# Patient Record
Sex: Female | Born: 1950
Health system: Southern US, Community
[De-identification: ages and names within clinical notes are randomized; demographics above are authoritative.]

## PROBLEM LIST (undated history)

## (undated) DIAGNOSIS — I2510 Atherosclerotic heart disease of native coronary artery without angina pectoris: Secondary | ICD-10-CM

## (undated) DIAGNOSIS — K219 Gastro-esophageal reflux disease without esophagitis: Secondary | ICD-10-CM

## (undated) DIAGNOSIS — K589 Irritable bowel syndrome without diarrhea: Secondary | ICD-10-CM

## (undated) DIAGNOSIS — G4730 Sleep apnea, unspecified: Secondary | ICD-10-CM

## (undated) DIAGNOSIS — T7840XA Allergy, unspecified, initial encounter: Secondary | ICD-10-CM

## (undated) DIAGNOSIS — M1990 Unspecified osteoarthritis, unspecified site: Secondary | ICD-10-CM

## (undated) DIAGNOSIS — F32A Depression, unspecified: Secondary | ICD-10-CM

## (undated) DIAGNOSIS — F419 Anxiety disorder, unspecified: Secondary | ICD-10-CM

## (undated) DIAGNOSIS — N281 Cyst of kidney, acquired: Secondary | ICD-10-CM

## (undated) DIAGNOSIS — I10 Essential (primary) hypertension: Secondary | ICD-10-CM

## (undated) DIAGNOSIS — F329 Major depressive disorder, single episode, unspecified: Secondary | ICD-10-CM

## (undated) DIAGNOSIS — K649 Unspecified hemorrhoids: Secondary | ICD-10-CM

## (undated) HISTORY — DX: Gastro-esophageal reflux disease without esophagitis: K21.9

## (undated) HISTORY — DX: Unspecified osteoarthritis, unspecified site: M19.90

## (undated) HISTORY — DX: Sleep apnea, unspecified: G47.30

## (undated) HISTORY — DX: Unspecified hemorrhoids: K64.9

## (undated) HISTORY — PX: SINOSCOPY: SHX187

## (undated) HISTORY — DX: Allergy, unspecified, initial encounter: T78.40XA

## (undated) HISTORY — DX: Essential (primary) hypertension: I10

## (undated) HISTORY — DX: Cyst of kidney, acquired: N28.1

## (undated) HISTORY — PX: BONE BIOPSY: SHX375

## (undated) HISTORY — DX: Atherosclerotic heart disease of native coronary artery without angina pectoris: I25.10

## (undated) HISTORY — PX: TUBAL LIGATION: SHX77

---
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
## 1997-07-15 ENCOUNTER — Emergency Department (HOSPITAL_COMMUNITY): Admission: EM | Admit: 1997-07-15 | Discharge: 1997-07-15 | Payer: Self-pay | Admitting: Emergency Medicine

## 1998-06-25 ENCOUNTER — Other Ambulatory Visit: Admission: RE | Admit: 1998-06-25 | Discharge: 1998-06-25 | Payer: Self-pay | Admitting: *Deleted

## 1999-06-13 ENCOUNTER — Other Ambulatory Visit: Admission: RE | Admit: 1999-06-13 | Discharge: 1999-06-13 | Payer: Self-pay | Admitting: Gynecology

## 1999-08-25 ENCOUNTER — Encounter: Payer: Self-pay | Admitting: *Deleted

## 1999-08-29 ENCOUNTER — Encounter (INDEPENDENT_AMBULATORY_CARE_PROVIDER_SITE_OTHER): Payer: Self-pay | Admitting: Specialist

## 1999-08-29 ENCOUNTER — Ambulatory Visit (HOSPITAL_COMMUNITY): Admission: RE | Admit: 1999-08-29 | Discharge: 1999-08-29 | Payer: Self-pay | Admitting: *Deleted

## 2000-05-31 ENCOUNTER — Encounter (INDEPENDENT_AMBULATORY_CARE_PROVIDER_SITE_OTHER): Payer: Self-pay | Admitting: Specialist

## 2000-05-31 ENCOUNTER — Observation Stay (HOSPITAL_COMMUNITY): Admission: RE | Admit: 2000-05-31 | Discharge: 2000-06-01 | Payer: Self-pay | Admitting: *Deleted

## 2001-02-13 HISTORY — PX: ABDOMINAL HYSTERECTOMY: SHX81

## 2003-04-14 ENCOUNTER — Other Ambulatory Visit: Admission: RE | Admit: 2003-04-14 | Discharge: 2003-04-14 | Payer: Self-pay | Admitting: *Deleted

## 2003-06-24 ENCOUNTER — Emergency Department (HOSPITAL_COMMUNITY): Admission: EM | Admit: 2003-06-24 | Discharge: 2003-06-24 | Payer: Self-pay | Admitting: Emergency Medicine

## 2003-07-27 ENCOUNTER — Emergency Department (HOSPITAL_COMMUNITY): Admission: EM | Admit: 2003-07-27 | Discharge: 2003-07-27 | Payer: Self-pay | Admitting: Emergency Medicine

## 2004-07-20 ENCOUNTER — Emergency Department (HOSPITAL_COMMUNITY): Admission: EM | Admit: 2004-07-20 | Discharge: 2004-07-20 | Payer: Self-pay | Admitting: Emergency Medicine

## 2004-10-24 ENCOUNTER — Other Ambulatory Visit: Admission: RE | Admit: 2004-10-24 | Discharge: 2004-10-24 | Payer: Self-pay | Admitting: *Deleted

## 2005-01-28 ENCOUNTER — Emergency Department (HOSPITAL_COMMUNITY): Admission: EM | Admit: 2005-01-28 | Discharge: 2005-01-28 | Payer: Self-pay | Admitting: Emergency Medicine

## 2006-01-26 ENCOUNTER — Ambulatory Visit: Payer: Self-pay | Admitting: Internal Medicine

## 2006-04-15 ENCOUNTER — Emergency Department (HOSPITAL_COMMUNITY): Admission: EM | Admit: 2006-04-15 | Discharge: 2006-04-15 | Payer: Self-pay | Admitting: Emergency Medicine

## 2006-11-02 ENCOUNTER — Emergency Department (HOSPITAL_COMMUNITY): Admission: EM | Admit: 2006-11-02 | Discharge: 2006-11-03 | Payer: Self-pay | Admitting: Emergency Medicine

## 2007-01-11 ENCOUNTER — Emergency Department (HOSPITAL_COMMUNITY): Admission: EM | Admit: 2007-01-11 | Discharge: 2007-01-11 | Payer: Self-pay | Admitting: Emergency Medicine

## 2007-08-15 ENCOUNTER — Ambulatory Visit: Payer: Self-pay | Admitting: Vascular Surgery

## 2007-08-15 ENCOUNTER — Encounter (INDEPENDENT_AMBULATORY_CARE_PROVIDER_SITE_OTHER): Payer: Self-pay | Admitting: Emergency Medicine

## 2007-08-15 ENCOUNTER — Emergency Department (HOSPITAL_COMMUNITY): Admission: EM | Admit: 2007-08-15 | Discharge: 2007-08-15 | Payer: Self-pay | Admitting: Emergency Medicine

## 2007-10-18 ENCOUNTER — Inpatient Hospital Stay (HOSPITAL_COMMUNITY): Admission: AD | Admit: 2007-10-18 | Discharge: 2007-10-18 | Payer: Self-pay | Admitting: Obstetrics and Gynecology

## 2008-10-01 ENCOUNTER — Inpatient Hospital Stay (HOSPITAL_COMMUNITY): Admission: EM | Admit: 2008-10-01 | Discharge: 2008-10-02 | Payer: Self-pay | Admitting: Emergency Medicine

## 2009-01-24 ENCOUNTER — Emergency Department (HOSPITAL_COMMUNITY): Admission: EM | Admit: 2009-01-24 | Discharge: 2009-01-25 | Payer: Self-pay | Admitting: Emergency Medicine

## 2009-10-12 ENCOUNTER — Emergency Department (HOSPITAL_COMMUNITY): Admission: EM | Admit: 2009-10-12 | Discharge: 2009-10-12 | Payer: Self-pay | Admitting: Emergency Medicine

## 2009-11-20 ENCOUNTER — Emergency Department (HOSPITAL_COMMUNITY): Admission: EM | Admit: 2009-11-20 | Discharge: 2009-11-20 | Payer: Self-pay | Admitting: Emergency Medicine

## 2010-02-04 ENCOUNTER — Emergency Department (HOSPITAL_COMMUNITY)
Admission: EM | Admit: 2010-02-04 | Discharge: 2010-02-04 | Payer: Self-pay | Source: Home / Self Care | Admitting: Family Medicine

## 2010-03-05 ENCOUNTER — Encounter: Payer: Self-pay | Admitting: *Deleted

## 2010-04-25 LAB — POCT I-STAT, CHEM 8
BUN: 14 mg/dL (ref 6–23)
Calcium, Ion: 1.1 mmol/L — ABNORMAL LOW (ref 1.12–1.32)
Chloride: 103 mEq/L (ref 96–112)
Creatinine, Ser: 0.9 mg/dL (ref 0.4–1.2)
Glucose, Bld: 102 mg/dL — ABNORMAL HIGH (ref 70–99)
HCT: 46 % (ref 36.0–46.0)
Hemoglobin: 15.6 g/dL — ABNORMAL HIGH (ref 12.0–15.0)
Potassium: 3.9 mEq/L (ref 3.5–5.1)
Sodium: 138 mEq/L (ref 135–145)
TCO2: 29 mmol/L (ref 0–100)

## 2010-04-27 LAB — POCT I-STAT, CHEM 8
BUN: 15 mg/dL (ref 6–23)
Calcium, Ion: 1.08 mmol/L — ABNORMAL LOW (ref 1.12–1.32)
Chloride: 106 mEq/L (ref 96–112)
Creatinine, Ser: 1 mg/dL (ref 0.4–1.2)
Glucose, Bld: 104 mg/dL — ABNORMAL HIGH (ref 70–99)
HCT: 45 % (ref 36.0–46.0)
Hemoglobin: 15.3 g/dL — ABNORMAL HIGH (ref 12.0–15.0)
Potassium: 3.8 mEq/L (ref 3.5–5.1)
Sodium: 139 mEq/L (ref 135–145)
TCO2: 24 mmol/L (ref 0–100)

## 2010-04-28 LAB — D-DIMER, QUANTITATIVE (NOT AT ARMC): D-Dimer, Quant: 0.49 ug/mL-FEU — ABNORMAL HIGH (ref 0.00–0.48)

## 2010-05-17 LAB — PREGNANCY, URINE: Preg Test, Ur: NEGATIVE

## 2010-05-17 LAB — DIFFERENTIAL
Basophils Absolute: 0.1 10*3/uL (ref 0.0–0.1)
Basophils Relative: 1 % (ref 0–1)
Eosinophils Absolute: 0.3 10*3/uL (ref 0.0–0.7)
Eosinophils Relative: 4 % (ref 0–5)
Lymphocytes Relative: 48 % — ABNORMAL HIGH (ref 12–46)
Lymphs Abs: 3.6 10*3/uL (ref 0.7–4.0)
Monocytes Absolute: 0.6 10*3/uL (ref 0.1–1.0)
Monocytes Relative: 8 % (ref 3–12)
Neutro Abs: 2.9 10*3/uL (ref 1.7–7.7)
Neutrophils Relative %: 39 % — ABNORMAL LOW (ref 43–77)

## 2010-05-17 LAB — CBC
HCT: 39.4 % (ref 36.0–46.0)
Hemoglobin: 13.5 g/dL (ref 12.0–15.0)
MCHC: 34.3 g/dL (ref 30.0–36.0)
MCV: 84.5 fL (ref 78.0–100.0)
Platelets: 391 10*3/uL (ref 150–400)
RBC: 4.66 MIL/uL (ref 3.87–5.11)
RDW: 13.5 % (ref 11.5–15.5)
WBC: 7.5 10*3/uL (ref 4.0–10.5)

## 2010-05-17 LAB — POCT CARDIAC MARKERS
CKMB, poc: <1 ng/mL — ABNORMAL LOW (ref 1.0–8.0)
Myoglobin, poc: 45.9 ng/mL (ref 12–200)
Troponin i, poc: <0.05 ng/mL (ref 0.00–0.09)

## 2010-05-17 LAB — COMPREHENSIVE METABOLIC PANEL
ALT: 11 U/L (ref 0–35)
AST: 19 U/L (ref 0–37)
Albumin: 3.8 g/dL (ref 3.5–5.2)
Alkaline Phosphatase: 76 U/L (ref 39–117)
BUN: 10 mg/dL (ref 6–23)
CO2: 29 mEq/L (ref 19–32)
Calcium: 8.8 mg/dL (ref 8.4–10.5)
Chloride: 105 mEq/L (ref 96–112)
Creatinine, Ser: 0.63 mg/dL (ref 0.4–1.2)
GFR calc Af Amer: >60 mL/min (ref >60)
GFR calc non Af Amer: >60 mL/min (ref >60)
Glucose, Bld: 93 mg/dL (ref 70–99)
Potassium: 3.3 mEq/L — ABNORMAL LOW (ref 3.5–5.1)
Sodium: 139 mEq/L (ref 135–145)
Total Bilirubin: 0.4 mg/dL (ref 0.3–1.2)
Total Protein: 6.6 g/dL (ref 6.0–8.3)

## 2010-05-17 LAB — APTT: aPTT: 27 seconds (ref 24–37)

## 2010-05-17 LAB — URINE MICROSCOPIC-ADD ON

## 2010-05-17 LAB — URINE CULTURE: Colony Count: 5000

## 2010-05-17 LAB — URINALYSIS, ROUTINE W REFLEX MICROSCOPIC
Bilirubin Urine: NEGATIVE
Glucose, UA: NEGATIVE mg/dL
Hgb urine dipstick: NEGATIVE
Ketones, ur: NEGATIVE mg/dL
Nitrite: NEGATIVE
Protein, ur: NEGATIVE mg/dL
Specific Gravity, Urine: 1.01 (ref 1.005–1.030)
Urobilinogen, UA: 0.2 mg/dL (ref 0.0–1.0)
pH: 7 (ref 5.0–8.0)

## 2010-05-17 LAB — PROTIME-INR
INR: 0.92 (ref 0.00–1.49)
Prothrombin Time: 12.3 seconds (ref 11.6–15.2)

## 2010-05-17 LAB — LIPASE, BLOOD: Lipase: 30 U/L (ref 11–59)

## 2010-05-17 LAB — HEMOCCULT GUIAC POC 1CARD (OFFICE): Fecal Occult Bld: POSITIVE

## 2010-05-21 LAB — CBC
HCT: 38.1 % (ref 36.0–46.0)
HCT: 39.1 % (ref 36.0–46.0)
Hemoglobin: 13.1 g/dL (ref 12.0–15.0)
Hemoglobin: 13.4 g/dL (ref 12.0–15.0)
MCHC: 34.2 g/dL (ref 30.0–36.0)
MCHC: 34.2 g/dL (ref 30.0–36.0)
MCV: 84.6 fL (ref 78.0–100.0)
MCV: 84.9 fL (ref 78.0–100.0)
Platelets: 343 10*3/uL (ref 150–400)
Platelets: 357 10*3/uL (ref 150–400)
RBC: 4.51 MIL/uL (ref 3.87–5.11)
RBC: 4.61 MIL/uL (ref 3.87–5.11)
RDW: 14 % (ref 11.5–15.5)
RDW: 14.5 % (ref 11.5–15.5)
WBC: 7.8 10*3/uL (ref 4.0–10.5)
WBC: 7.9 10*3/uL (ref 4.0–10.5)

## 2010-05-21 LAB — RAPID URINE DRUG SCREEN, HOSP PERFORMED
Amphetamines: NOT DETECTED
Barbiturates: NOT DETECTED
Benzodiazepines: NOT DETECTED
Cocaine: NOT DETECTED
Opiates: NOT DETECTED
Tetrahydrocannabinol: NOT DETECTED

## 2010-05-21 LAB — URINALYSIS, ROUTINE W REFLEX MICROSCOPIC
Bilirubin Urine: NEGATIVE
Glucose, UA: NEGATIVE mg/dL
Hgb urine dipstick: NEGATIVE
Ketones, ur: NEGATIVE mg/dL
Nitrite: NEGATIVE
Protein, ur: NEGATIVE mg/dL
Specific Gravity, Urine: 1.018 (ref 1.005–1.030)
Urobilinogen, UA: 0.2 mg/dL (ref 0.0–1.0)
pH: 6.5 (ref 5.0–8.0)

## 2010-05-21 LAB — POCT CARDIAC MARKERS
CKMB, poc: <1 ng/mL — ABNORMAL LOW (ref 1.0–8.0)
Myoglobin, poc: 47 ng/mL (ref 12–200)
Troponin i, poc: <0.05 ng/mL (ref 0.00–0.09)

## 2010-05-21 LAB — DIFFERENTIAL
Basophils Absolute: 0.1 10*3/uL (ref 0.0–0.1)
Basophils Relative: 1 % (ref 0–1)
Eosinophils Absolute: 0.2 10*3/uL (ref 0.0–0.7)
Eosinophils Relative: 3 % (ref 0–5)
Lymphocytes Relative: 45 % (ref 12–46)
Lymphs Abs: 3.5 10*3/uL (ref 0.7–4.0)
Monocytes Absolute: 0.6 10*3/uL (ref 0.1–1.0)
Monocytes Relative: 8 % (ref 3–12)
Neutro Abs: 3.4 10*3/uL (ref 1.7–7.7)
Neutrophils Relative %: 43 % (ref 43–77)

## 2010-05-21 LAB — POCT I-STAT, CHEM 8
BUN: 19 mg/dL (ref 6–23)
Calcium, Ion: 1.16 mmol/L (ref 1.12–1.32)
Chloride: 107 mEq/L (ref 96–112)
Creatinine, Ser: 0.9 mg/dL (ref 0.4–1.2)
Glucose, Bld: 108 mg/dL — ABNORMAL HIGH (ref 70–99)
HCT: 38 % (ref 36.0–46.0)
Hemoglobin: 12.9 g/dL (ref 12.0–15.0)
Potassium: 3.8 mEq/L (ref 3.5–5.1)
Sodium: 139 mEq/L (ref 135–145)
TCO2: 23 mmol/L (ref 0–100)

## 2010-05-21 LAB — CK TOTAL AND CKMB (NOT AT ARMC)
CK, MB: 1.1 ng/mL (ref 0.3–4.0)
Relative Index: INVALID (ref 0.0–2.5)
Total CK: 80 U/L (ref 7–177)

## 2010-05-21 LAB — CARDIAC PANEL(CRET KIN+CKTOT+MB+TROPI)
CK, MB: 1.2 ng/mL (ref 0.3–4.0)
Relative Index: INVALID (ref 0.0–2.5)
Total CK: 76 U/L (ref 7–177)
Troponin I: 0.02 ng/mL (ref 0.00–0.06)

## 2010-05-21 LAB — TROPONIN I: Troponin I: 0.01 ng/mL (ref 0.00–0.06)

## 2010-05-21 LAB — URINE MICROSCOPIC-ADD ON

## 2010-05-21 LAB — D-DIMER, QUANTITATIVE (NOT AT ARMC): D-Dimer, Quant: 0.28 ug/mL-FEU (ref 0.00–0.48)

## 2010-05-21 LAB — BRAIN NATRIURETIC PEPTIDE: Pro B Natriuretic peptide (BNP): <30 pg/mL (ref 0.0–100.0)

## 2010-06-28 NOTE — H&P (Signed)
[name] [name], [name] [name]                  ACCOUNT NO.:  [id number]   MEDICAL RECORD NO.:  [id number]          PATIENT TYPE:  INP   LOCATION:  2903                         FACILITY:  [facility]   PHYSICIAN:  Nanetta Batty, M.D.   DATE OF BIRTH:  10/08/1950   DATE OF ADMISSION:  10/01/2008  DATE OF DISCHARGE:  10/02/2008                              HISTORY & PHYSICAL   CHIEF COMPLAINTS:  Chest pain and weak spells.   HISTORY OF PRESENT ILLNESS:  The patient is a 60 year old female who is  followed at [facility] who is admitted through the emergency  room with complaints of weak spells and vague chest pain.  The patient  was recently seen in her primary care office for UTI.  She related a  history of episodic fatigue and daytime somnolence.  These episodes last  about 15 minutes or so.  She was told in her primary care office that  her heart rate was 44.  She was actually set up to see [place] as an  outpatient today.  On the evening prior to admission, she had a spell of  weakness associated with some vague chest fullness and came to the  emergency room.  She is admitted now for further evaluation.   PAST MEDICAL HISTORY:  Remarkable for hypertension.  She has been told  she has had prediabetes.  She has had hysterectomy and tonsillectomy  in the past.  She has seen Dr. Yancey Flemings in the past for reflux and  irritable bowel syndrome.  She has had a previous diagnosis of anxiety  and depression.  The patient feels that she has sleep apnea.  She  never had a formal sleep study.   CURRENT MEDICATIONS:  Lisinopril, Prilosec, and a sulfa antibiotic.   ALLERGIES:  She is allergic to [facility] and LATEX.   SOCIAL HISTORY:  She is divorced.  She has 1 child and 1 grandchild.  She quit smoking in 2008.  She is currently unemployed.  She lives  alone.   FAMILY HISTORY:  Remarkable for diabetes, but negative for coronary  disease.   REVIEW OF SYSTEMS:  Essentially unremarkable except for above,  she does  have occasional palpitations but no sustained tachycardia.  She does  admit to some exertional fatigue.  She has had a recent UTI as noted  above.  There is no history of dyslipidemia.   PHYSICAL EXAMINATION:  VITAL SIGNS:  Blood pressure is 144/88, pulse 65,  temperature 98.  GENERAL:  She is a well-developed, well-nourished female in no acute  distress.  HEENT:  Normocephalic.  Extraocular movements are intact.  Sclerae are  nonicteric.  Lids and conjunctivae are within normal limits.  NECK:  Without JVD or bruit.  CHEST:  Clear to auscultation and percussion.  CARDIAC:  Regular rate and rhythm without murmur, rub, or gallop.  Normal S1 and S2.  ABDOMEN:  Nontender.  No hepatosplenomegaly.  EXTREMITIES:  Without edema.  NEUROLOGIC:  Grossly intact.  She is awake, alert, oriented,  cooperative, and moves all extremities without obvious deficit.   LABORATORY DATA:  Troponins are negative x4.  Hematology and renal  profile are within normal limits.  EKG shows sinus rhythm with sinus  bradycardia at 50 without acute changes.   IMPRESSION:  1. Chest pain, myocardial infarction, ruled out.  2. Fatigue, possibly secondary to bradycardia or sleep apnea.  3. Previous hypertension.  4. History of smoking, quit in 2008.  5. Past history of reflux and irritable bowel seen by Dr. Yancey Flemings.  6. Self-diagnosed sleep apnea.  7. History of anxiety and depression.   PLAN:  The patient was seen by Dr. Allyson Sabal and myself.  She will be set up  for a stress test today.  Further evaluation will be depending on this  results.      Abelino Derrick, P.A.      Nanetta Batty, M.D.  Electronically Signed    LKK/MEDQ  D:  10/02/2008  T:  10/03/2008  Job:  [id number]   cc:   [facility]

## 2010-06-28 NOTE — Discharge Summary (Signed)
[name] [name], [name] [name]                  ACCOUNT NO.:  [id number]   MEDICAL RECORD NO.:  [id number]          PATIENT TYPE:  INP   LOCATION:  2903                         FACILITY:  [facility]   PHYSICIAN:  Sheliah Mends, MD      DATE OF BIRTH:  05-14-1950   DATE OF ADMISSION:  10/01/2008  DATE OF DISCHARGE:  10/02/2008                               DISCHARGE SUMMARY   DISCHARGE DIAGNOSES:  1. Chest pain, myocardial infarction ruled out.  2. Fatigue and poor exercise tolerance.  3. Baseline bradycardia.  4. Treated hypertension.  5. History of smoking.  6. History of irritable bowel syndrome and gastroesophageal reflux.  7. Anxiety and depression.  8. Probable sleep apnea.   HISTORY OF PRESENT ILLNESS:  The patient is a 60 year old female who was  admitted to the emergency room on October 01, 2008, with vague chest pain  and fatigue.  She has had episodic fatigue that last about 15 minutes at  a time.  Her symptoms have been going on for some time now.  Recently,  she went to her family doctor for UTI and related these episodes.  An  EKG was done there, which showed her heart rate to be 45.  She was  actually referred to see [place] as an outpatient and was to have the  appointment today, but last night presented to the emergency room after  an episode of fatigue at home associated with some vague chest  tightness.  There was no diaphoresis or radiation.  She ruled out for an  MI.  She was set up for an exercise Myoview.  On the treadmill, the  patient was only able to ambulate a minute and a half before stopping  because of fatigue.  Her heart rate went to 118, which was less than 75%  of her age predicted maximum heart rate.  The test was changed to an  adenosine Myoview.  Final results are pending, but preliminary suggest  no significant ischemia.  Dr. Allyson Sabal feels that the patient can follow up  with him as an outpatient.  She will need a sleep study and an echo at  some point.  We may also  want to consider an event monitor with her  history of bradycardia.  She also needs exercise training as she appears  to be in poor cardiovascular: Condition.   DISCHARGE MEDICATIONS:  See med rec sheet.   LABORATORY DATA:  White count 7.9, hemoglobin 13.4, hematocrit 39,  platelets 357.  D-dimer was negative 0.28.  Sodium 139, potassium 3.8,  BUN 19, creatinine 0.9, troponins are negative x3, BNP is less than 30.  Drug screen was negative.  Urinalysis did show a few bacteria and  moderate amount of leukocytes, she is on a sulfa antibiotic for UTI.  Chest x-ray is pending.  Myoview study final results showed no evidence  of mild cardial ischemia with an EF of 77% and normal LV function.  The  patient says she has recently had a TSH done at her primary care  doctor's office, but does not know the results  of this.  Her EKG shows  sinus rhythm, sinus bradycardia without acute changes.   DISPOSITION:  The patient is discharged in stable condition.  She had no  significant arrhythmia except for bradycardia with a heart rate of 45-  50.  Her enzymes are negative.  Dr. Allyson Sabal feels he can work her up  further as an outpatient.      Abelino Derrick, P.A.      Sheliah Mends, MD  Electronically Signed    LKK/MEDQ  D:  10/02/2008  T:  10/03/2008  Job:  [id number]   cc:   [facility]  Nanetta Batty, M.D.

## 2010-07-01 NOTE — H&P (Signed)
[facility]  Patient:    [name] [name], [name] [name]                         MRN: [id number] Adm. Date:  [id number] Attending:  Collene Schlichter                         History and Physical  CHIEF COMPLAINT:  Abnormal bleeding, pain.  HISTORY OF PRESENT ILLNESS:  This patient is a 60 year old gravida 1, para 1, who has been followed in our office over several years.  She has been having abnormal bleeding over the past several years and underwent a hysteroscopy D and C in July 2001 with benign findings.  Pap smears prior to that were normal as well.  She has continued to have abnormal bleeding and has been treated with various progestational agents in an attempt to cause a change in this situation without success.  She is admitted at this time for hysterectomy and possible bilateral salpingo-oophorectomy.  This procedure has been discussed with her fully to include the procedure in full and risks to include risks of anesthesia, injury to bowel, bladder, blood vessels, ureters, postoperative hemorrhage, infection, and recuperation.  She fully understands all of these considerations and wishes to proceed on May 31, 2000.  PAST MEDICAL HISTORY:  Includes latex allergy. She takes AcipHex 20 mg for GERD.  FAMILY HISTORY:  Essentially noncontributory.  REVIEW OF SYSTEMS:  HEENT: negative.  Cardiorespiratory: negative. Gastrointestinal: as noted above.  Genitourinary: as noted above. Neuromuscular: negative.  PHYSICAL EXAMINATION: VITAL SIGNS:  Height 54".  Weight 132 pounds.  Blood pressure 140/82.  Pulse 72.  Respirations 18. GENERAL:  Well-developed white female in no acute distress. HEENT: Within normal limits. NECK: Supple without masses, adenopathy, or bruits. HEART:  Regular rate and rhythm without murmurs. BREASTS:  Sitting in line without mass. AXILLAE:  Negative. ABDOMEN:  Flat and soft without mass or tenderness. PELVIC: External genitalia and  Bartholins, urethral, and Skenes glands within normal limits.  Cervix slightly inflamed.  Uterus is midposition, top-normal size, and slightly tender.  There are no palpable adnexal masses. The anterior and posterior cul-de-sac exam is confirmatory. EXTREMITIES:  Within normal limits. NEUROLOGICAL:  Central nervous system is grossly intact. SKIN: Without suspicious lesions.  IMPRESSION:  Abnormal uterine bleeding - unresolved.  DISPOSITION:  As noted above. DD:  05/30/00 TD:  05/31/00 Job: [id number]

## 2010-07-01 NOTE — Assessment & Plan Note (Signed)
[facility]                         GASTROENTEROLOGY OFFICE NOTE   NAME:[name] [name]                         MRN:          [id number]  DATE:01/26/2006                            DOB:          1950/05/17    REFERRING PHYSICIAN:  Dr. Ellamae Sia.   REASON FOR CONSULTATION:  Reflux disease.  Change in bowel habits, and  screening colonoscopy.   HISTORY:  This is a 60 year old white female with no significant past  medical history, who presents today regarding a myriad of GI complaints.  First, she reports being seen earlier this year at Urgent Care for a  change in her stool color.  She reports that her stools are somewhat  green.  She apparently underwent blood work which was unrevealing.  It  was recommended that she undergo colonoscopy.  She did not follow  through immediately with that recommendation due to lack of health  insurance.  Recently, she reports having discontinued smoking, which she  has done for many years.  She now describes mucus or fullness sensation  in her throat.  She states she feels like she has trouble swallowing,  though she really does not have true esophageal dysphagia, but rather  more of a globus-type sensation.  She does have a history of reflux  disease, as manifested by heartburn, indigestion, nausea, and  regurgitation.  She had been on Nexium previously with excellent  results.  However, due to the prohibitive cost, she is no longer on  medication.  Currently, she is experiencing significant heartburn and  indigestion over the past 3 months, in addition to some regurgitation  and some substernal chest discomfort associated with all of this.  She  also reports chronic alternating bowel habits and minor intermittent  rectal bleeding, which she has attributed to hemorrhoids.  She also  feels that she may have an upper respiratory illness, and is currently  on Mucinex.   PAST MEDICAL HISTORY:  The patient states  she has had problems with  anxiety and depression.  Questionable bradycardia when she was under  stress, as well as she circles sleep apnea, though it is not clear how  this was diagnosed.   PAST SURGICAL HISTORY:  1. Hysterectomy.  2. Tonsillectomy.   ALLERGIES:  The patient is intolerant to [facility].  As well, she is  allergic to LATEX.   CURRENT MEDICATIONS:  Mucinex once daily, Tylenol p.r.n.   FAMILY HISTORY:  Mother with colon polyps and diabetes.   SOCIAL HISTORY:  The patient is divorced with 1 daughter.  She lives  alone.  She works as a Chartered certified accountant for [employer].  She smoked a  half a pack of cigarettes per day for 21 years, but quit 1 week ago.  She occasionally uses alcohol.   REVIEW OF SYSTEMS:  Per diagnostic evaluation form.   PHYSICAL EXAM:  Somewhat anxious, but otherwise well-appearing female in  no acute distress.  Blood pressure is 148/80, heart rate of 64, weight 156 pounds.  She is 5  feet 4 inches in height.  HEENT:  Sclerae are anicteric.  Conjunctivae pink.  Oral mucosa is  intact.  Oropharynx is unremarkable.  No evidence of thrush.  No  erythema.  There is no lymphadenopathy.  LUNGS:  Clear.  HEART:  Regular.  ABDOMEN:  Soft without tenderness, mass, or hernia.  Good bowel sounds  heard.   IMPRESSION:  1. Gastroesophageal reflux disease.  2. Globus-type sensation, possibly due to reflux disease or post-nasal      drip.  Cannot rule out functional cause.  3. Chronic alternating bowel habits consistent with irritable bowel.      Intermittent rectal bleeding likely due to hemorrhoids.   RECOMMENDATIONS:  1. Daily proton pump inhibitor therapy.  2. Information on reflux disease, as well as reflux precautions.  3. Schedule upper endoscopy to evaluate chronic reflux disease, as      well as rule out anatomic cause for globus-type complaints.  4. Schedule colonoscopy to evaluate lower GI complaints, as well as      provide colon cancer  screening.  The nature of the procedures, as      well as their risks, benefits, and alternatives were reviewed in      detail.  She understood and agreed to proceed.     Wilhemina Bonito. Marina Goodell, MD  Electronically Signed    JNP/MedQ  DD: 01/28/2006  DT: 01/28/2006  Job #: [id number]   cc:   Harrel Lemon. Merla Riches, M.D.

## 2010-07-01 NOTE — Op Note (Signed)
[facility]  Patient:    [name] [name], [name] [name]                         MRN: [id number] Proc. Date: 05/31/00 Adm. Date:  [id number] Attending:  Collene Schlichter CC:         Harl Bowie, M.D.                           Operative Report  PREOPERATIVE DIAGNOSES:  Abnormal uterine bleeding, pelvic pain.  POSTOPERATIVE DIAGNOSES:  Abnormal uterine bleeding, pelvic pain, probably endometriosis, pending pathology.  OPERATION PERFORMED:  Laparoscopically assisted vaginal hysterectomy, destruction of endometriosis.  ANESTHESIA:  General oral tracheal.  SURGEON:  Dr. Randell Patient.  FIRST ASSISTANT:  Dr. Leona Singleton.  INDICATIONS FOR PROCEDURE:  The patient is a 60 year old with the above noted problems who has been counselled as to the need for surgery to treat these problems and the type surgery to be performed to include risk of anesthesia, injury to bowel, bladder, blood vessels, ureters, postoperative hemorrhage, infection, recuperation and the possibility of hormone replacement should her ovaries be removed. She fully understands all these considerations and wishes to proceed on May 31, 2000.  OPERATIVE FINDINGS:  On laparoscopy, the lower liver edge, gallbladder, spleen, and area of the appendix were normal to visualization. The uterus was mid posterior, top normal size, and somewhat soft. Both ovaries appeared normal.  Both tubes had been previously interrupted with Falope rings for sterilization attempts. There were areas of window formation in the posterior cul-de-sac along the uterosacral ligaments suggestive of endometriosis.  DESCRIPTION OF PROCEDURE:  With the patient under general anesthesia, prepared and draped in the usual sterile fashion, with a tenaculum on the cervix and an empty bladder post catheterization, an incision was made in the lower pole of the umbilicus. The Veress cannula was inserted into the peritoneal cavity with insufflation of 3  liters of carbon dioxide. The reusable 10 mm trocar for the operative scope and the scope itself were then inserted into the peritoneal cavity. A reusable 5 mm probe was inserted through a stab wound just above the symphysis pubis. The above noted findings were visualized. Bipolar electrocoagulation was used to render the uterine ovarian anastomoses, tubes, and round ligaments bilaterally hemostatic with gradual sequential transection of these areas to free the fundal area from the adnexal structures. A bladder flap was then created on the anterior surface of the uterus and cervix using bipolar electrocoagulation for hemostasis and sharp dissection to create the flap. After noting that hemostasis was maintained in the operative site, the patient was repositioned for a vaginal procedure.  A weighted speculum was placed in the posterior vagina, and the cervix was grasped with two Loman [place]. A solution of 1% lidocaine with 1:200,000 epinephrine was injected circumferentially on the cervix; a total of 10 ml was used. An incision was made in the vaginal mucosa anteriorly for development of the bladder flap in this area. The bladder was dissected off the cervix and lower uterine segment. It was then possible to enter the posterior cul-de-sac without difficulty. Heaney clamps were used to clamp in succession the vaginal cuff angles, the uterosacral ligaments, cardinal ligaments and uterine vessels bilaterally; these structures were sequentially cut free and individually suture ligated with #1 chromic catgut. It was then possible to invert the uterus and clamp the remaining attachments of the broad ligaments to allow for removal of  the uterus and cervix as a single specimen. These structures were then doubly ligated with #1 chromic catgut. The area was lavaged with copious amounts of irrigating solution and after noting that hemostasis was maintained in the pedicle areas, a modified McCall  suture was placed in the posterior cul-de-sac for obliteration of this area and prevention of enterocele. The peritoneum was then reapproximated with a pursestring suture of #1 chromic catgut. The vaginal cuff was reapproximated and rendered hemostatic with continuous interlocking suture of #1 chromic catgut. The uterosacral ligament sutures which had been held ______ were tied together in the midline as was the Leslie suture with good elevation of the vaginal cuff. The patient was then catheterized with free flow of clear urine.  After a change of gloves and redraping, the patient was relaparoscoped. The surgical area was lavaged with copious amounts of lactated Ringers solution, and after noting that hemostasis was maintained and that sponge and instruments counts were correct, the instruments were removed from the peritoneal cavity. Gas was allowed to fully escape and the incisions were closed with fascia sutures of 2-0 Vicryl and subcuticular sutures of 3-0 plain catgut. Estimated blood loss 150 ml. The patient was taken to the recovery room in good condition. She will be placed on 23 hour observation following surgery. DD:  05/31/00 TD:  05/31/00 Job: [id number]

## 2010-07-01 NOTE — Op Note (Signed)
[facility]  Patient:    [name] [name], [name] [name] [facility]                           MRN: [id number] Proc. Date: 08/29/99 Attending:  Almedia Balls. Randell Patient, M.D.                           Operative Report  PREOPERATIVE DIAGNOSIS:  Abnormal uterine bleeding.  Leiomyomata uteri.  POSTOPERATIVE DIAGNOSIS:  Abnormal uterine bleeding.  Leiomyomata uteri. Pending pathology.  OPERATION PERFORMED:  Diagnostic hysteroscopy, fractional dilatation and curettage.  SURGEON:  Almedia Balls. Randell Patient, M.D.  ANESTHESIA:  MAC.  INDICATIONS FOR PROCEDURE:  The patient is a 60 year old with persistent heavy bleeding irregularly who was counseled as to the need for surgery to help evaluate and treat this problem.  She was fully counseled as to the nature of the procedure and the risks involved, to include risks of anesthesia, injury to the uterus, tubes, ovaries, bowel, bladder, blood vessels, ureters, postoperative hemorrhage, infection and recuperation.  She fully understands all these considerations and wishes to proceed on August 29, 1999.  OPERATIVE FINDINGS:  On bimanual exam, the uterus was midposterior approximately [redacted] weeks gestational size.  There were no palpable adnexal masses.  On vaginal exam, there was a moderate amount of bleeding occurring. The cervix was dilated already up through 21 Pratt dilator.  The uterus sounded to 9.5 cm in a midposterior position.  On hysteroscopy, the endocervical canal was cleaned.  The endometrial cavity had a moderate amount of shaggy-appearing endometrium and areas of irregular contour suggestive of submucous myomata.  DESCRIPTION OF PROCEDURE:  With the patient under sedation, prepared and draped in the usual sterile fashion, a speculum was placed in the vagina.  A single-toothed tenaculum was placed at the 12 oclock position, and a small sharp curet was used for curettage of the endocervical canal.  The uterus was then sounded as noted above, and the  cervix had been dilated previously by the patients continued bleeding and passage of clots, so that further dilating was unnecessary.  The hysteroscope was introduced using free flow of Hiscon under direct vision.  The above-noted findings were visualized.  The hysteroscope was then removed, and a medium sharp curet and polyp forceps were used for removal of tissue and exploration of the uterine cavity with the above noted findings and a moderate amount of tissue being obtained. Hysteroscope was re-employed to ensure that all tissue was removed, after noting that this was the case and that sponge and instrument counts were correct and hemostasis was maintained, the procedure was terminated. Estimated blood loss for this procedure was less than 25 ml.  The patient was transferred to the recovery room in good condition.  FOLLOW-UP:  She will return to the office in approximately two weeks for follow-up and to call if heavy bleeding, pain or unexplained fever should ensue.  She was given a prescription for Wygesic or generic #6 to be taken one half to one q.4h. p.r.n. pain. DD:  08/29/99 TD:  08/29/99 Job: [id number]

## 2010-10-18 ENCOUNTER — Emergency Department (HOSPITAL_COMMUNITY): Payer: Self-pay

## 2010-10-18 ENCOUNTER — Emergency Department (HOSPITAL_COMMUNITY)
Admission: EM | Admit: 2010-10-18 | Discharge: 2010-10-18 | Disposition: A | Discharge status: Home / Self Care | Payer: Self-pay | Attending: Emergency Medicine | Admitting: Emergency Medicine

## 2010-10-18 DIAGNOSIS — M79609 Pain in unspecified limb: Secondary | ICD-10-CM | POA: Insufficient documentation

## 2010-10-18 DIAGNOSIS — R569 Unspecified convulsions: Secondary | ICD-10-CM | POA: Insufficient documentation

## 2010-10-18 DIAGNOSIS — I10 Essential (primary) hypertension: Secondary | ICD-10-CM | POA: Insufficient documentation

## 2010-10-18 DIAGNOSIS — F411 Generalized anxiety disorder: Secondary | ICD-10-CM | POA: Insufficient documentation

## 2010-10-18 DIAGNOSIS — G4730 Sleep apnea, unspecified: Secondary | ICD-10-CM | POA: Insufficient documentation

## 2010-10-18 DIAGNOSIS — K589 Irritable bowel syndrome without diarrhea: Secondary | ICD-10-CM | POA: Insufficient documentation

## 2010-10-18 DIAGNOSIS — K219 Gastro-esophageal reflux disease without esophagitis: Secondary | ICD-10-CM | POA: Insufficient documentation

## 2010-10-18 DIAGNOSIS — G8929 Other chronic pain: Secondary | ICD-10-CM | POA: Insufficient documentation

## 2010-10-18 DIAGNOSIS — M25519 Pain in unspecified shoulder: Secondary | ICD-10-CM | POA: Insufficient documentation

## 2010-11-10 LAB — POCT I-STAT, CHEM 8
BUN: 14
Calcium, Ion: 1.15
Chloride: 105
Creatinine, Ser: 0.9
Glucose, Bld: 97
HCT: 40
Hemoglobin: 13.6
Potassium: 4
Sodium: 139
TCO2: 25

## 2010-11-10 LAB — URINALYSIS, ROUTINE W REFLEX MICROSCOPIC
Bilirubin Urine: NEGATIVE
Glucose, UA: NEGATIVE
Hgb urine dipstick: NEGATIVE
Ketones, ur: NEGATIVE
Nitrite: NEGATIVE
Protein, ur: NEGATIVE
Specific Gravity, Urine: 1.024
Urobilinogen, UA: 0.2
pH: 5.5

## 2010-11-10 LAB — D-DIMER, QUANTITATIVE: D-Dimer, Quant: 1.39 — ABNORMAL HIGH

## 2010-11-16 LAB — WET PREP, GENITAL
Clue Cells Wet Prep HPF POC: NONE SEEN
Trich, Wet Prep: NONE SEEN
Yeast Wet Prep HPF POC: NONE SEEN

## 2010-11-16 LAB — CBC
HCT: 39.7
Hemoglobin: 13.4
MCHC: 33.7
MCV: 83.8
Platelets: 428 — ABNORMAL HIGH
RBC: 4.74
RDW: 14.1
WBC: 6.6

## 2010-11-22 LAB — URINALYSIS, ROUTINE W REFLEX MICROSCOPIC
Bilirubin Urine: NEGATIVE
Glucose, UA: NEGATIVE
Hgb urine dipstick: NEGATIVE
Ketones, ur: NEGATIVE
Nitrite: NEGATIVE
Protein, ur: NEGATIVE
Specific Gravity, Urine: 1.009
Urobilinogen, UA: 0.2
pH: 5.5

## 2010-11-22 LAB — BASIC METABOLIC PANEL
BUN: 13
CO2: 28
Calcium: 9.6
Chloride: 106
Creatinine, Ser: 0.8
GFR calc Af Amer: >60
GFR calc non Af Amer: >60
Glucose, Bld: 99
Potassium: 3.4 — ABNORMAL LOW
Sodium: 140

## 2010-11-22 LAB — CBC
HCT: 38.7
Hemoglobin: 13.6
MCHC: 35.1
MCV: 82.6
Platelets: 465 — ABNORMAL HIGH
RBC: 4.68
RDW: 13.6
WBC: 8.7

## 2010-11-22 LAB — DIFFERENTIAL
Basophils Absolute: 0.1
Basophils Relative: 1
Eosinophils Absolute: 0.2
Eosinophils Relative: 2
Lymphocytes Relative: 39
Lymphs Abs: 3.4
Monocytes Absolute: 0.6
Monocytes Relative: 6
Neutro Abs: 4.6
Neutrophils Relative %: 52

## 2010-11-22 LAB — GC/CHLAMYDIA PROBE AMP, GENITAL
Chlamydia, DNA Probe: NEGATIVE
GC Probe Amp, Genital: NEGATIVE

## 2010-11-22 LAB — WET PREP, GENITAL
Trich, Wet Prep: NONE SEEN
WBC, Wet Prep HPF POC: NONE SEEN
Yeast Wet Prep HPF POC: NONE SEEN

## 2010-11-22 LAB — RPR: RPR Ser Ql: NONREACTIVE

## 2010-11-24 LAB — URINALYSIS, ROUTINE W REFLEX MICROSCOPIC
Bilirubin Urine: NEGATIVE
Glucose, UA: NEGATIVE
Hgb urine dipstick: NEGATIVE
Ketones, ur: NEGATIVE
Nitrite: NEGATIVE
Protein, ur: NEGATIVE
Specific Gravity, Urine: 1.008
Urobilinogen, UA: 0.2
pH: 7

## 2010-11-24 LAB — DIFFERENTIAL
Basophils Absolute: 0.1
Basophils Relative: 1
Eosinophils Absolute: 0.1
Eosinophils Relative: 2
Lymphocytes Relative: 33
Lymphs Abs: 2.9
Monocytes Absolute: 0.7
Monocytes Relative: 8
Neutro Abs: 5
Neutrophils Relative %: 57

## 2010-11-24 LAB — BASIC METABOLIC PANEL
BUN: 13
CO2: 27
Calcium: 9.1
Chloride: 105
Creatinine, Ser: 0.92
GFR calc Af Amer: >60
GFR calc non Af Amer: >60
Glucose, Bld: 104 — ABNORMAL HIGH
Potassium: 3.4 — ABNORMAL LOW
Sodium: 137

## 2010-11-24 LAB — CBC
HCT: 38.6
Hemoglobin: 13.1
MCHC: 34
MCV: 84.2
Platelets: 403 — ABNORMAL HIGH
RBC: 4.58
RDW: 14
WBC: 8.9

## 2010-11-24 LAB — RETICULOCYTES
RBC.: 4.61
Retic Count, Absolute: 69.2
Retic Ct Pct: 1.5

## 2010-11-24 LAB — PREGNANCY, URINE: Preg Test, Ur: NEGATIVE

## 2011-05-22 ENCOUNTER — Encounter (HOSPITAL_COMMUNITY): Payer: Self-pay | Admitting: Emergency Medicine

## 2011-05-22 ENCOUNTER — Emergency Department (HOSPITAL_COMMUNITY): Payer: Medicaid Other

## 2011-05-22 ENCOUNTER — Other Ambulatory Visit: Payer: Self-pay

## 2011-05-22 ENCOUNTER — Inpatient Hospital Stay (HOSPITAL_COMMUNITY)
Admission: EM | Admit: 2011-05-22 | Discharge: 2011-05-31 | DRG: 234 | Disposition: A | Discharge status: Skilled Nursing Facility | Payer: Medicaid Other | Attending: Cardiothoracic Surgery | Admitting: Cardiothoracic Surgery

## 2011-05-22 DIAGNOSIS — I2510 Atherosclerotic heart disease of native coronary artery without angina pectoris: Principal | ICD-10-CM | POA: Diagnosis present

## 2011-05-22 DIAGNOSIS — K589 Irritable bowel syndrome without diarrhea: Secondary | ICD-10-CM | POA: Diagnosis present

## 2011-05-22 DIAGNOSIS — R079 Chest pain, unspecified: Secondary | ICD-10-CM | POA: Diagnosis present

## 2011-05-22 DIAGNOSIS — I200 Unstable angina: Secondary | ICD-10-CM | POA: Diagnosis present

## 2011-05-22 DIAGNOSIS — N281 Cyst of kidney, acquired: Secondary | ICD-10-CM

## 2011-05-22 DIAGNOSIS — D62 Acute posthemorrhagic anemia: Secondary | ICD-10-CM | POA: Diagnosis not present

## 2011-05-22 DIAGNOSIS — F339 Major depressive disorder, recurrent, unspecified: Secondary | ICD-10-CM | POA: Diagnosis present

## 2011-05-22 DIAGNOSIS — N189 Chronic kidney disease, unspecified: Secondary | ICD-10-CM | POA: Diagnosis present

## 2011-05-22 DIAGNOSIS — E876 Hypokalemia: Secondary | ICD-10-CM | POA: Diagnosis present

## 2011-05-22 DIAGNOSIS — F341 Dysthymic disorder: Secondary | ICD-10-CM | POA: Diagnosis present

## 2011-05-22 DIAGNOSIS — I129 Hypertensive chronic kidney disease with stage 1 through stage 4 chronic kidney disease, or unspecified chronic kidney disease: Secondary | ICD-10-CM | POA: Diagnosis present

## 2011-05-22 DIAGNOSIS — I498 Other specified cardiac arrhythmias: Secondary | ICD-10-CM | POA: Diagnosis present

## 2011-05-22 DIAGNOSIS — F419 Anxiety disorder, unspecified: Secondary | ICD-10-CM | POA: Diagnosis present

## 2011-05-22 DIAGNOSIS — Q619 Cystic kidney disease, unspecified: Secondary | ICD-10-CM

## 2011-05-22 DIAGNOSIS — Z87891 Personal history of nicotine dependence: Secondary | ICD-10-CM

## 2011-05-22 DIAGNOSIS — R259 Unspecified abnormal involuntary movements: Secondary | ICD-10-CM | POA: Diagnosis present

## 2011-05-22 HISTORY — DX: Depression, unspecified: F32.A

## 2011-05-22 HISTORY — DX: Anxiety disorder, unspecified: F41.9

## 2011-05-22 HISTORY — DX: Irritable bowel syndrome, unspecified: K58.9

## 2011-05-22 HISTORY — DX: Major depressive disorder, single episode, unspecified: F32.9

## 2011-05-22 LAB — CBC
HCT: 40.5 % (ref 36.0–46.0)
Hemoglobin: 13.8 g/dL (ref 12.0–15.0)
MCH: 27.9 pg (ref 26.0–34.0)
MCHC: 34.1 g/dL (ref 30.0–36.0)
MCV: 82 fL (ref 78.0–100.0)
Platelets: 398 10*3/uL (ref 150–400)
RBC: 4.94 MIL/uL (ref 3.87–5.11)
RDW: 13.5 % (ref 11.5–15.5)
WBC: 10.4 10*3/uL (ref 4.0–10.5)

## 2011-05-22 LAB — URINALYSIS, ROUTINE W REFLEX MICROSCOPIC
Bilirubin Urine: NEGATIVE
Glucose, UA: NEGATIVE mg/dL
Hgb urine dipstick: NEGATIVE
Ketones, ur: NEGATIVE mg/dL
Nitrite: NEGATIVE
Protein, ur: NEGATIVE mg/dL
Specific Gravity, Urine: 1.019 (ref 1.005–1.030)
Urobilinogen, UA: 0.2 mg/dL (ref 0.0–1.0)
pH: 6.5 (ref 5.0–8.0)

## 2011-05-22 LAB — POCT I-STAT, CHEM 8
BUN: 20 mg/dL (ref 6–23)
Calcium, Ion: 1.13 mmol/L (ref 1.12–1.32)
Chloride: 99 mEq/L (ref 96–112)
Creatinine, Ser: 0.9 mg/dL (ref 0.50–1.10)
Glucose, Bld: 99 mg/dL (ref 70–99)
HCT: 42 % (ref 36.0–46.0)
Hemoglobin: 14.3 g/dL (ref 12.0–15.0)
Potassium: 2.8 mEq/L — ABNORMAL LOW (ref 3.5–5.1)
Sodium: 139 mEq/L (ref 135–145)
TCO2: 27 mmol/L (ref 0–100)

## 2011-05-22 LAB — URINE MICROSCOPIC-ADD ON

## 2011-05-22 LAB — POCT I-STAT TROPONIN I: Troponin i, poc: 0 ng/mL (ref 0.00–0.08)

## 2011-05-22 LAB — PRO B NATRIURETIC PEPTIDE: Pro B Natriuretic peptide (BNP): 61.5 pg/mL (ref 0–125)

## 2011-05-22 MED ORDER — POTASSIUM PHOSPHATE DIBASIC 3 MMOLE/ML IV SOLN: 10.0000 meq | Freq: Once | INTRAVENOUS | Status: DC

## 2011-05-22 MED ORDER — POTASSIUM CHLORIDE 10 MEQ/100ML IV SOLN
10.0000 meq | Freq: Once | INTRAVENOUS | Status: AC
  Administered 2011-05-22: 10 meq via INTRAVENOUS
  Filled 2011-05-22: qty 100

## 2011-05-22 MED ORDER — SODIUM CHLORIDE 0.9 % IV SOLN
1000.0000 mL | INTRAVENOUS | Status: DC
  Administered 2011-05-22: 1000 mL via INTRAVENOUS

## 2011-05-22 MED ORDER — ASPIRIN 81 MG PO CHEW
324.0000 mg | CHEWABLE_TABLET | Freq: Once | ORAL | Status: AC
  Administered 2011-05-22: 324 mg via ORAL
  Filled 2011-05-22: qty 4

## 2011-05-22 MED ORDER — NITROGLYCERIN 0.4 MG SL SUBL: 0.4000 mg | SUBLINGUAL_TABLET | SUBLINGUAL | Status: DC | PRN

## 2011-05-22 MED ORDER — POTASSIUM CHLORIDE CRYS ER 20 MEQ PO TBCR
40.0000 meq | EXTENDED_RELEASE_TABLET | Freq: Once | ORAL | Status: AC
  Administered 2011-05-22: 40 meq via ORAL
  Filled 2011-05-22: qty 2

## 2011-05-22 NOTE — ED Notes (Signed)
Patient given turkey sandwich and water (OKed by EDP). 

## 2011-05-22 NOTE — ED Notes (Signed)
PA at bedside.

## 2011-05-22 NOTE — ED Provider Notes (Signed)
Patient in CDU for chest pain protocol - states no real pain but chest pressure that started yesterday - has had this in the past since 2009 - has seen Dr. Allyson Sabal in the past for this but did not continue to follow up, remains painfree at this time, denies any associated symptoms including shortness of breath, diaphoresis, nausea, radiation of the pain.  HEENT - atraumatic, normocephalic, PERRLA, TM's normal, no pharyngeal erythema, neck supple no tracheal deviation, no JVD or carotid bruits, lungs CTA bilaterally, Heart - bradycardia with regular rhythm without ectopy or RMG, abd soft nttp, LE without swelling tenderness or edema, pulses 2+ radial, carotid, DP and PT.  [name] [name], [place] 05/22/11 [id number]

## 2011-05-22 NOTE — ED Provider Notes (Signed)
History     CSN: [id number]  Arrival date & time 05/22/11  1735   First MD Initiated Contact with Patient 05/22/11 1837      Chief Complaint  Patient presents with  . Hypertension    (Consider location/radiation/quality/duration/timing/severity/associated sxs/prior treatment) HPI Comments: Pt with a hx of HTN presents to the ED w cc of low HR (usually runs around 60 now 42) & chest pressure. Pt states that her BP has been all over the place and she was recently started on Tenoretic for BP control.  Onset of pressure started yesterday, located substernal, does not radiate, rated at a 4/10, associated symptoms include fatigue, light headedness and dizziness. Pt denies any SOB, DOE, syncope, palpitations, edema, claudication, cough, hemoptysis, N/V, abd pain  Patient has been evaluated by her PCP, cardiology and the ED previously for similar symptoms of fatigue, bradycardia and  CP. Her symptoms have been going on since 2009 as of charts reviewed. She was evaluated by Dr. Allyson Sabal for this in 2010 and was ruled out for an MI.  Pt had stress test w no significant ischemia.  Pt was non compliant w Dr. Hazle Coca request for further follow up as an outpatient d/t insignificant funds.  Pt never had echo that was recommended.     No cardiologist, pt seen by Dr. Allyson Sabal  PCP: Dr Rande Lawman at [facility]  Patient is a 61 y.o. female presenting with chest pain. The history is provided by the patient.  Chest Pain Pertinent negatives for primary symptoms include no fever, no fatigue, no shortness of breath, no cough, no wheezing, no palpitations, no abdominal pain, no nausea, no vomiting and no dizziness.  Pertinent negatives for associated symptoms include no diaphoresis. She tried nothing for the symptoms. Risk factors include being elderly.  Pertinent negatives for past medical history include no CHF, no MI, no mitral valve prolapse and no PE.  Procedure history is positive for stress echo (2009). Procedure  history comments: coranary CTA in 2009.     Past Medical History  Diagnosis Date  . Hypertension     Past Surgical History  Procedure Date  . Abdominal hysterectomy   . Tonsillectomy     No family history on file.  History  Substance Use Topics  . Smoking status: Never Smoker   . Smokeless tobacco: Not on file  . Alcohol Use: No    OB History    Grav Para Term Preterm Abortions TAB SAB Ect Mult Living                  Review of Systems  Constitutional: Negative for fever, chills, diaphoresis, activity change, fatigue and unexpected weight change.  HENT: Negative for congestion, neck pain and neck stiffness.   Eyes: Negative for visual disturbance.  Respiratory: Negative for apnea, cough, chest tightness, shortness of breath, wheezing and stridor.   Cardiovascular: Positive for chest pain and leg swelling. Negative for palpitations.  Gastrointestinal: Negative for nausea, vomiting, abdominal pain, diarrhea and blood in stool.  Genitourinary: Negative for dysuria, urgency, hematuria and flank pain.  Musculoskeletal: Negative for myalgias, back pain and gait problem.  Skin: Negative for pallor.  Neurological: Negative for dizziness, syncope, light-headedness and headaches.  All other systems reviewed and are negative.    Allergies  Review of patient's allergies indicates not on file.  Home Medications  No current outpatient prescriptions on file.  BP 129/67  Pulse 46  Resp 18  SpO2 97%  Physical Exam  Nursing note and  vitals reviewed. Constitutional: She appears well-developed and well-nourished. No distress.  HENT:  Head: Normocephalic and atraumatic.  Eyes: Conjunctivae and EOM are normal. Pupils are equal, round, and reactive to light.  Neck: Normal range of motion. Neck supple. Normal carotid pulses and no JVD present. Carotid bruit is not present. No rigidity. Normal range of motion present.  Cardiovascular: Normal rate, regular rhythm, S1 normal, S2  normal, normal heart sounds, intact distal pulses and normal pulses.  Exam reveals no gallop and no friction rub.   No murmur heard.      No pitting edema bilaterally, RRR, no aberrant sounds on auscultations, distal pulses intact, no carotid bruit or JVD.   Pulmonary/Chest: Effort normal and breath sounds normal. No accessory muscle usage or stridor. No respiratory distress. She exhibits no tenderness and no bony tenderness.  Abdominal: Bowel sounds are normal.       Soft non tender. Non pulsatile aorta.   Skin: Skin is warm, dry and intact. No rash noted. She is not diaphoretic. No cyanosis. Nails show no clubbing.    ED Course  Procedures (including critical care time)   Labs Reviewed  CBC   No results found.   No diagnosis found.  Pt given 40 PO potassium and 10 mEq IV   MDM  CP, Hypokalemia  Patient is to be moved to CDU for CTA. Chest pain is not likely of cardiac or pulmonary etiology d/t  perc negative, VSS, no tracheal deviation, no JVD or new murmur, RRR, breath sounds equal bilaterally, EKG without acute abnormalities, negative troponin, and negative CXR.  Patient history and plan has been discussed with the CDU midlevel provider who is agreeable with transfer. This patient has also been seen and evaluated by the attending who agrees that the patient will benefit from observation. Patient will be placed on CP protocol with re-evaluation pending. Patient stable prior to transfer, has no complaints, and is in NAD. Plan is  Dc w f-u with Dr. Allyson Sabal s/p CTAngio if normal results   MDM Number of Diagnoses or Management Options  c       Jaci Carrel, PA-C 05/22/11 2042

## 2011-05-22 NOTE — ED Notes (Addendum)
First meeting with patient. Patient sleeping with NAD at this time. IV is saline locked and patient remains on monitor and sats of 95% on RA. Patient denies any chest pain or SOB at this time.

## 2011-05-22 NOTE — ED Notes (Signed)
Pt states that she does not want nitro tablets at this time

## 2011-05-22 NOTE — ED Notes (Signed)
Received bedside report from Selena Batten, Charity fundraiser.  Patient currently resting quietly in bed; no respiratory or acute distress noted.  Introduced self to patient; will continue to monitor.

## 2011-05-22 NOTE — ED Notes (Signed)
Pt reports high blood pressure, monitors at home, is on a new BP, pulse dropped to 52, then got worried that at night it might get lower so she came in to be checked

## 2011-05-23 ENCOUNTER — Encounter (HOSPITAL_COMMUNITY): Payer: Self-pay | Admitting: Cardiology

## 2011-05-23 ENCOUNTER — Observation Stay (HOSPITAL_COMMUNITY): Payer: Medicaid Other

## 2011-05-23 DIAGNOSIS — N281 Cyst of kidney, acquired: Secondary | ICD-10-CM

## 2011-05-23 DIAGNOSIS — E876 Hypokalemia: Secondary | ICD-10-CM | POA: Diagnosis present

## 2011-05-23 DIAGNOSIS — F339 Major depressive disorder, recurrent, unspecified: Secondary | ICD-10-CM | POA: Diagnosis present

## 2011-05-23 DIAGNOSIS — F419 Anxiety disorder, unspecified: Secondary | ICD-10-CM | POA: Diagnosis present

## 2011-05-23 DIAGNOSIS — I200 Unstable angina: Secondary | ICD-10-CM | POA: Diagnosis present

## 2011-05-23 DIAGNOSIS — Z87891 Personal history of nicotine dependence: Secondary | ICD-10-CM

## 2011-05-23 LAB — TSH: TSH: 0.635 u[IU]/mL (ref 0.350–4.500)

## 2011-05-23 LAB — COMPREHENSIVE METABOLIC PANEL
ALT: 10 U/L (ref 0–35)
AST: 13 U/L (ref 0–37)
Albumin: 3.7 g/dL (ref 3.5–5.2)
Alkaline Phosphatase: 74 U/L (ref 39–117)
BUN: 11 mg/dL (ref 6–23)
CO2: 24 mEq/L (ref 19–32)
Calcium: 9.1 mg/dL (ref 8.4–10.5)
Chloride: 105 mEq/L (ref 96–112)
Creatinine, Ser: 0.62 mg/dL (ref 0.50–1.10)
GFR calc Af Amer: >90 mL/min (ref >90)
GFR calc non Af Amer: >90 mL/min (ref >90)
Glucose, Bld: 94 mg/dL (ref 70–99)
Potassium: 3.3 mEq/L — ABNORMAL LOW (ref 3.5–5.1)
Sodium: 140 mEq/L (ref 135–145)
Total Bilirubin: 0.3 mg/dL (ref 0.3–1.2)
Total Protein: 6.3 g/dL (ref 6.0–8.3)

## 2011-05-23 LAB — CBC
HCT: 39.2 % (ref 36.0–46.0)
Hemoglobin: 13.5 g/dL (ref 12.0–15.0)
MCH: 28.2 pg (ref 26.0–34.0)
MCHC: 34.4 g/dL (ref 30.0–36.0)
MCV: 82 fL (ref 78.0–100.0)
Platelets: 395 10*3/uL (ref 150–400)
RBC: 4.78 MIL/uL (ref 3.87–5.11)
RDW: 13.6 % (ref 11.5–15.5)
WBC: 7.4 10*3/uL (ref 4.0–10.5)

## 2011-05-23 LAB — CARDIAC PANEL(CRET KIN+CKTOT+MB+TROPI)
CK, MB: 1.5 ng/mL (ref 0.3–4.0)
Relative Index: INVALID (ref 0.0–2.5)
Total CK: 65 U/L (ref 7–177)
Troponin I: <0.3 ng/mL (ref <0.30)

## 2011-05-23 LAB — POCT I-STAT TROPONIN I
Troponin i, poc: 0.01 ng/mL (ref 0.00–0.08)
Troponin i, poc: 0.01 ng/mL (ref 0.00–0.08)

## 2011-05-23 LAB — PROTIME-INR
INR: 0.98 (ref 0.00–1.49)
Prothrombin Time: 13.2 seconds (ref 11.6–15.2)

## 2011-05-23 LAB — MAGNESIUM: Magnesium: 1.8 mg/dL (ref 1.5–2.5)

## 2011-05-23 MED ORDER — ISOSORBIDE MONONITRATE ER 30 MG PO TB24
30.0000 mg | ORAL_TABLET | Freq: Every day | ORAL | Status: DC
  Administered 2011-05-23 – 2011-05-24 (×2): 30 mg via ORAL
  Filled 2011-05-23 (×2): qty 1

## 2011-05-23 MED ORDER — DIAZEPAM 5 MG PO TABS
5.0000 mg | ORAL_TABLET | ORAL | Status: AC
  Administered 2011-05-24: 5 mg via ORAL
  Filled 2011-05-23: qty 1

## 2011-05-23 MED ORDER — ATORVASTATIN CALCIUM 40 MG PO TABS
40.0000 mg | ORAL_TABLET | Freq: Every day | ORAL | Status: DC
  Filled 2011-05-23: qty 1

## 2011-05-23 MED ORDER — ONDANSETRON HCL 4 MG/2ML IJ SOLN
4.0000 mg | Freq: Four times a day (QID) | INTRAMUSCULAR | Status: DC | PRN
  Administered 2011-05-24: 4 mg via INTRAVENOUS

## 2011-05-23 MED ORDER — ADULT MULTIVITAMIN W/MINERALS CH
1.0000 | ORAL_TABLET | Freq: Every day | ORAL | Status: DC
  Administered 2011-05-23 – 2011-05-31 (×8): 1 via ORAL
  Filled 2011-05-23 (×9): qty 1

## 2011-05-23 MED ORDER — ACETAMINOPHEN 325 MG PO TABS
650.0000 mg | ORAL_TABLET | ORAL | Status: DC | PRN
  Administered 2011-05-24: 650 mg via ORAL
  Filled 2011-05-23: qty 2

## 2011-05-23 MED ORDER — ZOLPIDEM TARTRATE 5 MG PO TABS: 10.0000 mg | ORAL_TABLET | Freq: Every evening | ORAL | Status: DC | PRN

## 2011-05-23 MED ORDER — METOPROLOL TARTRATE 12.5 MG HALF TABLET
12.5000 mg | ORAL_TABLET | Freq: Two times a day (BID) | ORAL | Status: DC
  Administered 2011-05-25: 12.5 mg via ORAL
  Filled 2011-05-23 (×8): qty 1

## 2011-05-23 MED ORDER — NITROGLYCERIN 0.4 MG SL SUBL
0.4000 mg | SUBLINGUAL_TABLET | Freq: Once | SUBLINGUAL | Status: AC
  Administered 2011-05-23: 0.4 mg via SUBLINGUAL

## 2011-05-23 MED ORDER — IBUPROFEN 800 MG PO TABS
800.0000 mg | ORAL_TABLET | Freq: Once | ORAL | Status: AC
  Administered 2011-05-23: 800 mg via ORAL
  Filled 2011-05-23: qty 1

## 2011-05-23 MED ORDER — SODIUM CHLORIDE 0.9 % IV SOLN: 250.0000 mL | INTRAVENOUS | Status: DC | PRN

## 2011-05-23 MED ORDER — ALPRAZOLAM 0.25 MG PO TABS: 0.2500 mg | ORAL_TABLET | Freq: Three times a day (TID) | ORAL | Status: DC | PRN

## 2011-05-23 MED ORDER — ATORVASTATIN CALCIUM 40 MG PO TABS
40.0000 mg | ORAL_TABLET | Freq: Once | ORAL | Status: AC
  Administered 2011-05-23: 40 mg via ORAL
  Filled 2011-05-23: qty 1

## 2011-05-23 MED ORDER — ASPIRIN 81 MG PO CHEW
324.0000 mg | CHEWABLE_TABLET | ORAL | Status: AC
  Administered 2011-05-24: 324 mg via ORAL
  Filled 2011-05-23: qty 4

## 2011-05-23 MED ORDER — PANTOPRAZOLE SODIUM 40 MG PO TBEC
40.0000 mg | DELAYED_RELEASE_TABLET | Freq: Every day | ORAL | Status: DC
  Administered 2011-05-24 – 2011-05-25 (×2): 40 mg via ORAL
  Filled 2011-05-23 (×2): qty 1

## 2011-05-23 MED ORDER — SODIUM CHLORIDE 0.9 % IJ SOLN: 3.0000 mL | INTRAMUSCULAR | Status: DC | PRN

## 2011-05-23 MED ORDER — IOHEXOL 350 MG/ML SOLN
80.0000 mL | Freq: Once | INTRAVENOUS | Status: AC | PRN
  Administered 2011-05-23: 80 mL via INTRAVENOUS

## 2011-05-23 MED ORDER — SODIUM CHLORIDE 0.9 % IV SOLN
1.0000 mL/kg/h | INTRAVENOUS | Status: DC
  Administered 2011-05-23 – 2011-05-24 (×2): 1 mL/kg/h via INTRAVENOUS

## 2011-05-23 NOTE — ED Notes (Signed)
Patient WT= 162 lbs. And Height = 5'-4" with BMI of 27.8

## 2011-05-23 NOTE — ED Notes (Signed)
Returned from ct. C/o H/a.

## 2011-05-23 NOTE — ED Notes (Signed)
Transported for ct scan. Pt very upset about receiving nitro sl. States it gives a horrible migraine. Radiologist aware. Decision pending.

## 2011-05-23 NOTE — ED Provider Notes (Signed)
Medical screening examination/treatment/procedure(s) were conducted as a shared visit with non-physician practitioner(s) and myself.  I personally evaluated the patient during the encounter.patient in CDU under chest pain protocol. Has seen cardiology in the past but has not followed up. Will get a CTA of morning    Juliet Rude. Rubin Payor, MD 05/23/11 2259

## 2011-05-23 NOTE — ED Provider Notes (Signed)
Medical screening examination/treatment/procedure(s) were conducted as a shared visit with non-physician practitioner(s) and myself.  I personally evaluated the patient during the encounter. Patient with reported high blood pressure. Recently she had chest pain. Previous negative stress test. She did not followup with cardiology due to financial reasons. Never had recommended echocardiogram. Patient will be on chest pain protocol  [name] [name]. Rubin Payor, MD 05/23/11 2300

## 2011-05-23 NOTE — H&P (Signed)
Patient ID: [name] [name] MRN: [id number], DOB/AGE: Dec 25, 1950   Admit date: 05/22/2011   Primary Physician: No primary provider on file. Primary Cardiologist: Dr Allyson Sabal  HPI: 61 y/o female we had seen in 2010 for chest pain, Myoview then negative. She saw [place] once in follow up but has no insurance and could not afford to keep subsequent appointments. She is followed at [facility] in [place]. About two weeks ago she had HCTZ changed to Tenormin secondary to "cramping" though the HCTZ was controlling her B/P well. The Tenormin was not as effective and a week ago she was changed to Tenoretic. Today she noted a slow HR and decided to come to the ER. She has had some dizziness but no syncope. CT done in ER showed >50% LMCA narrowing and we are asked to see her for further evaluation.    Problem List: Past Medical History  Diagnosis Date  . Hypertension   . IBS (irritable bowel syndrome)   . Anxiety   . Depression     Past Surgical History  Procedure Date  . Abdominal hysterectomy   . Tonsillectomy      Allergies:  Allergies  Allergen Reactions  . Levaquin Nausea And Vomiting  . Lisinopril Other (See Comments)    Excessive mucus production  . Latex Rash     Home Medications  (Not in a hospital admission) Tenoretic 100/25   No family history on file.   History   Social History  . Marital Status: Divorced    Spouse Name: N/A    Number of Children: N/A  . Years of Education: N/A   Occupational History  . Not on file.   Social History Main Topics  . Smoking status: Former Smoker    Quit date: 05/23/2006  . Smokeless tobacco: Not on file  . Alcohol Use: No  . Drug Use: No  . Sexually Active: Not on file   Other Topics Concern  . Not on file   Social History Narrative  . No narrative on file     Review of Systems: Positive for IBS. Positive for hemorrhoidal bleeding. Postive for history of large solitary renal cyst. No GI bleeding, no diabetes, no problems with  renal function.  Physical Exam: Blood pressure 127/50, pulse 49, temperature 97.8 F (36.6 C), temperature source Oral, resp. rate 16, height 5\' 4"  (1.626 m), weight 74.844 kg (165 lb), SpO2 98.00%.  General appearance: alert, cooperative, no distress and anxious, tearful Neck: no carotid bruit, no JVD, supple, symmetrical, trachea midline and thyroid not enlarged, symmetric, no tenderness/mass/nodules Lungs: few basilar crackles Heart: regular rate and rhythm, S1, S2 normal, no murmur, click, rub or gallop Abdomen: soft, non-tender; bowel sounds normal; no masses,  no organomegaly Extremities: extremities normal, atraumatic, no cyanosis or edema Pulses: 2+ and symmetric Skin: Skin color, texture, turgor normal. No rashes or lesions Neurologic: Grossly normal    Labs:   Results for orders placed during the hospital encounter of 05/22/11 (from the past 24 hour(s))  PRO B NATRIURETIC PEPTIDE     Status: Normal   Collection Time   05/22/11  7:08 PM      Component Value Range   Pro B Natriuretic peptide (BNP) 61.5  0 - 125 (pg/mL)  CBC     Status: Normal   Collection Time   05/22/11  7:12 PM      Component Value Range   WBC 10.4  4.0 - 10.5 (K/uL)   RBC 4.94  3.87 - 5.11 (MIL/uL)  Hemoglobin 13.8  12.0 - 15.0 (g/dL)   HCT 16.1  09.6 - 04.5 (%)   MCV 82.0  78.0 - 100.0 (fL)   MCH 27.9  26.0 - 34.0 (pg)   MCHC 34.1  30.0 - 36.0 (g/dL)   RDW 40.9  81.1 - 91.4 (%)   Platelets 398  150 - 400 (K/uL)  POCT I-STAT TROPONIN I     Status: Normal   Collection Time   05/22/11  7:32 PM      Component Value Range   Troponin i, poc 0.00  0.00 - 0.08 (ng/mL)   Comment 3           POCT I-STAT, CHEM 8     Status: Abnormal   Collection Time   05/22/11  7:34 PM      Component Value Range   Sodium 139  135 - 145 (mEq/L)   Potassium 2.8 (*) 3.5 - 5.1 (mEq/L)   Chloride 99  96 - 112 (mEq/L)   BUN 20  6 - 23 (mg/dL)   Creatinine, Ser 7.82  0.50 - 1.10 (mg/dL)   Glucose, Bld 99  70 - 99 (mg/dL)    Calcium, Ion 9.56  1.12 - 1.32 (mmol/L)   TCO2 27  0 - 100 (mmol/L)   Hemoglobin 14.3  12.0 - 15.0 (g/dL)   HCT 21.3  08.6 - 57.8 (%)  URINALYSIS, ROUTINE W REFLEX MICROSCOPIC     Status: Abnormal   Collection Time   05/22/11  7:53 PM      Component Value Range   Color, Urine YELLOW  YELLOW    APPearance CLEAR  CLEAR    Specific Gravity, Urine 1.019  1.005 - 1.030    pH 6.5  5.0 - 8.0    Glucose, UA NEGATIVE  NEGATIVE (mg/dL)   Hgb urine dipstick NEGATIVE  NEGATIVE    Bilirubin Urine NEGATIVE  NEGATIVE    Ketones, ur NEGATIVE  NEGATIVE (mg/dL)   Protein, ur NEGATIVE  NEGATIVE (mg/dL)   Urobilinogen, UA 0.2  0.0 - 1.0 (mg/dL)   Nitrite NEGATIVE  NEGATIVE    Leukocytes, UA TRACE (*) NEGATIVE   URINE MICROSCOPIC-ADD ON     Status: Normal   Collection Time   05/22/11  7:53 PM      Component Value Range   Squamous Epithelial / LPF RARE  RARE    WBC, UA 0-2  <3 (WBC/hpf)   RBC / HPF 0-2  <3 (RBC/hpf)  POCT I-STAT TROPONIN I     Status: Normal   Collection Time   05/23/11  6:56 AM      Component Value Range   Troponin i, poc 0.01  0.00 - 0.08 (ng/mL)   Comment 3           POCT I-STAT TROPONIN I     Status: Normal   Collection Time   05/23/11  8:19 AM      Component Value Range   Troponin i, poc 0.01  0.00 - 0.08 (ng/mL)   Comment 3              Radiology/Studies: Ct Heart Morp W/cta Cor W/score W/ca W/cm &/or Wo/cm  05/23/2011  *RADIOLOGY REPORT*  INDICATION:  Chest pain.  Coronary artery disease.  CT ANGIOGRAPHY OF THE HEART, CORONARY ARTERY, STRUCTURE, AND MORPHOLOGY  CONTRAST:   IMPRESSION:  1.  Age advanced coronary artery disease.  The patient's total coronary artery calcium score is 72, which is 83 percentile for patient's matched age  and gender. 2.  High-grade stenosis of the left main coronary artery, greater than 50%.  Cardiology consultation recommended.  This is a mixture of hard and soft plaque.  On curve multiplanar reformatted images, the stenosis appears closer to the 60-70%.  3.  Right coronary artery dominance. 4.  Borderline size of the main pulmonary artery. 5.  Patulous gastroesophageal junction and distal esophageal thickening suggesting gastroesophageal reflux disease.  Report was called to physician's assistant Trixie Dredge at 1130 hours on 05/23/2011.  Original Report Authenticated By: Andreas Newport, M.D.   Dg Chest Portable 1 View  05/22/2011  *RADIOLOGY REPORT*  Clinical Data: Hypertension, chest heaviness.  PORTABLE CHEST - 1 VIEW  Comparison: 01/24/2009  Findings: Normal cardiac and mediastinal silhouette.  Clear lung fields.  No acute bony abnormalities are identified.  No focal airspace disease, effusion, or pneumothorax.  Little change from priors.  IMPRESSION: Negative chest.  Original Report Authenticated By: Elsie Stain, M.D.    EKG: NSR SB, no acute changes  ASSESSMENT AND PLAN:   Principal Problem:  Chest pain/question [place]  Active Problems:  Abnormal screening cardiac CT, > 50% LMCA  Hypokalemia, 2.8 on admission  Anxiety  Depression  Renal cyst  Plan- MD to see, she needs cath, ? Today vs AM. Will admit adjust med, K+ ordered.  Deland Pretty, PA-C 05/23/2011, 1:33 PM   Patient seen and examined. Agree with assessment and plan.  Very pleasant 61 yo WF with a remote 35 yr tobacco history who presents with bradycardia and hypokalemia probably contributed by her Tenoretic and who has experienced  vague chest sensation.  She denies exertional chest pain. CT suggests CAD involving LMCA. Will admit, K replete. Pt denies present chest pain.  Plan definitive cardiac catheterization tomorrow.  Will decrease beta-blocker and DC diuretic and add nitrates.    Lennette Bihari, MD, [facility] 05/23/2011 1:49 PM

## 2011-05-23 NOTE — ED Notes (Signed)
[id number] Ready

## 2011-05-23 NOTE — ED Notes (Signed)
Pt agrees to go ahead with testing

## 2011-05-23 NOTE — ED Provider Notes (Signed)
Medical screening examination/treatment/procedure(s) were conducted as a shared visit with non-physician practitioner(s) and myself.  I personally evaluated the patient during the encounter. Patient in CDU for chest pain protocol. Coronary CT shows high-grade lesion patient admitted  [name] [name]. Rubin Payor, MD 05/23/11 2258

## 2011-05-23 NOTE — ED Notes (Signed)
sehv at bedside

## 2011-05-23 NOTE — ED Provider Notes (Signed)
8:34 AM Patient is in CDU under observation, chest pain protocol.  This is a shared visit with Dr Rubin Payor.  Patient reports mild soreness in her central chest, states it is slightly better with sitting upright.  Requests ibuprofen for pain.  Patient continues to be bradycardic, rate 42 while I was in the room.  On exam, pt is A&Ox4, NAD, bradycardic, no m/r/g, CTAB, chest is nontender, abd soft, NT, extremities without edema, distal pulses intact and equal bilaterally.  Plan is for coronary CT this morning.  Patient verbalizes understanding and agrees with plan.    11:36 AM Received call from Dr Carlota Raspberry.  Patient with high grade stenosis of left main with large mixed lesion.  I have discussed this with Dr Freida Busman, who is now seeing the patient and will consult cardiology.   Dr Freida Busman states patient will be seen by cardiology today, possible cath.   Patient to be admitted by cardiology.      Rise Patience, [place] 05/23/11 1408

## 2011-05-23 NOTE — ED Provider Notes (Signed)
Medical screening examination/treatment/procedure(s) were conducted as a shared visit with non-physician practitioner(s) and myself.  I personally evaluated the patient during the encounter  Patient seen and examined. Patient with diseased coronaries and I have spoken with cardiology and they will see patient in the ED  Toy Baker, MD 05/23/11 1202

## 2011-05-23 NOTE — ED Notes (Signed)
Heart Healthy Tray Ordered  

## 2011-05-23 NOTE — ED Notes (Signed)
Patient sleeping with NAD at this time. Patient remains on monitor and sats of 96% RA.

## 2011-05-23 NOTE — ED Notes (Signed)
Results of ct reported to pt. Awaiting cards consult.

## 2011-05-23 NOTE — Progress Notes (Signed)
Pt c/o slight heaviness to chest, refused ntg. EKG without changes. Imdur and lipitor given.  No distress noted.  Dr. Allyson Sabal paged, awaiting return call.

## 2011-05-24 ENCOUNTER — Encounter (HOSPITAL_COMMUNITY)
Admission: EM | Disposition: A | Discharge status: Skilled Nursing Facility | Payer: Self-pay | Source: Home / Self Care | Attending: Cardiothoracic Surgery

## 2011-05-24 ENCOUNTER — Other Ambulatory Visit: Payer: Self-pay | Admitting: Cardiothoracic Surgery

## 2011-05-24 DIAGNOSIS — I2510 Atherosclerotic heart disease of native coronary artery without angina pectoris: Secondary | ICD-10-CM

## 2011-05-24 HISTORY — PX: LEFT HEART CATHETERIZATION WITH CORONARY ANGIOGRAM: SHX5451

## 2011-05-24 LAB — CBC
HCT: 38.1 % (ref 36.0–46.0)
Hemoglobin: 12.6 g/dL (ref 12.0–15.0)
MCH: 27.7 pg (ref 26.0–34.0)
MCHC: 33.1 g/dL (ref 30.0–36.0)
MCV: 83.7 fL (ref 78.0–100.0)
Platelets: 358 10*3/uL (ref 150–400)
RBC: 4.55 MIL/uL (ref 3.87–5.11)
RDW: 13.8 % (ref 11.5–15.5)
WBC: 7.7 10*3/uL (ref 4.0–10.5)

## 2011-05-24 LAB — BASIC METABOLIC PANEL
BUN: 13 mg/dL (ref 6–23)
CO2: 25 mEq/L (ref 19–32)
Calcium: 8.7 mg/dL (ref 8.4–10.5)
Chloride: 103 mEq/L (ref 96–112)
Creatinine, Ser: 0.64 mg/dL (ref 0.50–1.10)
GFR calc Af Amer: >90 mL/min (ref >90)
GFR calc non Af Amer: >90 mL/min (ref >90)
Glucose, Bld: 93 mg/dL (ref 70–99)
Potassium: 3.1 mEq/L — ABNORMAL LOW (ref 3.5–5.1)
Sodium: 139 mEq/L (ref 135–145)

## 2011-05-24 LAB — CARDIAC PANEL(CRET KIN+CKTOT+MB+TROPI)
CK, MB: 1.3 ng/mL (ref 0.3–4.0)
Relative Index: INVALID (ref 0.0–2.5)
Total CK: 58 U/L (ref 7–177)
Troponin I: <0.3 ng/mL (ref <0.30)

## 2011-05-24 LAB — SURGICAL PCR SCREEN
MRSA, PCR: NEGATIVE
Staphylococcus aureus: NEGATIVE

## 2011-05-24 LAB — MRSA PCR SCREENING: MRSA by PCR: NEGATIVE

## 2011-05-24 SURGERY — LEFT HEART CATHETERIZATION WITH CORONARY ANGIOGRAM
Anesthesia: LOCAL

## 2011-05-24 MED ORDER — HEPARIN (PORCINE) IN NACL 100-0.45 UNIT/ML-% IJ SOLN
1000.0000 [IU]/h | INTRAMUSCULAR | Status: DC
  Administered 2011-05-24 – 2011-05-25 (×2): 1000 [IU]/h via INTRAVENOUS
  Filled 2011-05-24 (×2): qty 250

## 2011-05-24 MED ORDER — ONDANSETRON HCL 4 MG/2ML IJ SOLN
INTRAMUSCULAR | Status: AC
  Filled 2011-05-24: qty 2

## 2011-05-24 MED ORDER — SODIUM CHLORIDE 0.9 % IJ SOLN
3.0000 mL | Freq: Two times a day (BID) | INTRAMUSCULAR | Status: DC
  Administered 2011-05-24 – 2011-05-25 (×3): 3 mL via INTRAVENOUS

## 2011-05-24 MED ORDER — SODIUM CHLORIDE 0.9 % IV SOLN
1.0000 mL/kg/h | INTRAVENOUS | Status: AC
  Administered 2011-05-24: 1 mL/kg/h via INTRAVENOUS

## 2011-05-24 MED ORDER — NITROGLYCERIN 0.2 MG/ML ON CALL CATH LAB
INTRAVENOUS | Status: AC
  Filled 2011-05-24: qty 1

## 2011-05-24 MED ORDER — HEPARIN (PORCINE) IN NACL 2-0.9 UNIT/ML-% IJ SOLN
INTRAMUSCULAR | Status: AC
  Filled 2011-05-24: qty 2000

## 2011-05-24 MED ORDER — SODIUM CHLORIDE 0.9 % IV SOLN
250.0000 mL | INTRAVENOUS | Status: DC
  Administered 2011-05-24: 250 mL via INTRAVENOUS

## 2011-05-24 MED ORDER — POTASSIUM CHLORIDE CRYS ER 20 MEQ PO TBCR
40.0000 meq | EXTENDED_RELEASE_TABLET | Freq: Once | ORAL | Status: AC
  Administered 2011-05-24: 40 meq via ORAL

## 2011-05-24 MED ORDER — POTASSIUM CHLORIDE CRYS ER 20 MEQ PO TBCR
40.0000 meq | EXTENDED_RELEASE_TABLET | Freq: Once | ORAL | Status: AC
  Administered 2011-05-24: 40 meq via ORAL
  Filled 2011-05-24 (×2): qty 2

## 2011-05-24 MED ORDER — ONDANSETRON HCL 4 MG/2ML IJ SOLN: 4.0000 mg | Freq: Four times a day (QID) | INTRAMUSCULAR | Status: DC | PRN

## 2011-05-24 MED ORDER — HEPARIN SODIUM (PORCINE) 1000 UNIT/ML IJ SOLN
INTRAMUSCULAR | Status: AC
  Filled 2011-05-24: qty 1

## 2011-05-24 MED ORDER — ACETAMINOPHEN 325 MG PO TABS
650.0000 mg | ORAL_TABLET | ORAL | Status: DC | PRN
  Administered 2011-05-25: 650 mg via ORAL
  Filled 2011-05-24: qty 2

## 2011-05-24 MED ORDER — FENTANYL CITRATE 0.05 MG/ML IJ SOLN
INTRAMUSCULAR | Status: AC
  Filled 2011-05-24: qty 2

## 2011-05-24 MED ORDER — SODIUM CHLORIDE 0.9 % IJ SOLN: 3.0000 mL | INTRAMUSCULAR | Status: DC | PRN

## 2011-05-24 MED ORDER — LIDOCAINE HCL (PF) 1 % IJ SOLN
INTRAMUSCULAR | Status: AC
  Filled 2011-05-24: qty 30

## 2011-05-24 MED ORDER — MORPHINE SULFATE 2 MG/ML IJ SOLN
1.0000 mg | INTRAMUSCULAR | Status: DC | PRN
  Administered 2011-05-24: 1 mg via INTRAVENOUS
  Filled 2011-05-24: qty 1

## 2011-05-24 MED ORDER — NITROGLYCERIN IN D5W 200-5 MCG/ML-% IV SOLN
INTRAVENOUS | Status: AC
  Filled 2011-05-24: qty 250

## 2011-05-24 MED ORDER — NITROGLYCERIN IN D5W 200-5 MCG/ML-% IV SOLN
2.0000 ug/min | INTRAVENOUS | Status: DC
  Administered 2011-05-24: 10 ug/min via INTRAVENOUS

## 2011-05-24 MED ORDER — ALPRAZOLAM 0.25 MG PO TABS
0.2500 mg | ORAL_TABLET | ORAL | Status: DC | PRN
  Administered 2011-05-24 – 2011-05-26 (×3): 0.25 mg via ORAL
  Filled 2011-05-24 (×3): qty 1

## 2011-05-24 MED ORDER — MIDAZOLAM HCL 2 MG/2ML IJ SOLN
INTRAMUSCULAR | Status: AC
  Filled 2011-05-24: qty 2

## 2011-05-24 NOTE — Progress Notes (Signed)
ANTICOAGULATION CONSULT NOTE - Initial Consult  Pharmacy Consult for Heparin Indication: distal LM disease  Allergies  Allergen Reactions  . Levaquin Nausea And Vomiting  . Lisinopril Other (See Comments)    Excessive mucus production  . Latex Rash    Patient Measurements: Height: 5\' 4"  (162.6 cm) Weight: 165 lb (74.844 kg) IBW/kg (Calculated) : 54.7  Heparin Dosing Weight:   Vital Signs: Temp: 97.8 F (36.6 C) (04/10 1552) Temp src: Oral (04/10 1552) BP: 113/61 mmHg (04/10 1530) Pulse Rate: 56  (04/10 1530)  Labs:  Basename 05/24/11 0543 05/23/11 2327 05/23/11 1444 05/22/11 1934 05/22/11 1912  HGB 12.6 -- 13.5 -- --  HCT 38.1 -- 39.2 42.0 --  PLT 358 -- 395 -- 398  APTT -- -- -- -- --  LABPROT -- -- 13.2 -- --  INR -- -- 0.98 -- --  HEPARINUNFRC -- -- -- -- --  CREATININE 0.64 -- 0.62 0.90 --  CKTOTAL -- 58 65 -- --  CKMB -- 1.3 1.5 -- --  TROPONINI -- <0.30 <0.30 -- --   Estimated Creatinine Clearance: 74 ml/min (by C-G formula based on Cr of 0.64).  Medical History: Past Medical History  Diagnosis Date  . Hypertension   . IBS (irritable bowel syndrome)   . Anxiety   . Depression   . Complication of anesthesia     difficult waking up "  . Chronic kidney disease     cyst left kidney    Medications:  Scheduled:    . aspirin  324 mg Oral Pre-Cath  . atorvastatin  40 mg Oral Once  . diazepam  5 mg Oral On Call  . fentaNYL      . heparin      . heparin      . lidocaine      . metoprolol tartrate  12.5 mg Oral BID  . midazolam      . mulitivitamin with minerals  1 tablet Oral Daily  . nitroGLYCERIN      . pantoprazole  40 mg Oral Q0600  . potassium chloride  40 mEq Oral Once  . potassium chloride  40 mEq Oral Once  . sodium chloride  3 mL Intravenous Q12H  . DISCONTD: atorvastatin  40 mg Oral q1800  . DISCONTD: isosorbide mononitrate  30 mg Oral Daily    Assessment: 61 yr old female post cath and now awaiting surgical consult for distal LM  disease to start heparin. Goal of Therapy:  Heparin level 0.3-0.7 units/ml   Plan: Will start heparin at 1000 units/hr 6 hrs post sheath hemostasis (which should be around 2200) with no bolus. Heparin level and CBC with AM labs which will be 6-7 hrs after heparin is started, then daily while on heparin.  Eugene Garnet 05/24/2011,4:46 PM

## 2011-05-24 NOTE — Brief Op Note (Addendum)
05/22/2011 - 05/24/2011  1:46 PM  PATIENT:  [name] [name]  61 y.o. female seen initially in 2010 for chest pain, Myoview then negative. She saw [place] once in follow up but has no insurance and could not afford to keep subsequent appointments. She is followed at [facility] in [place]. About two weeks ago she had HCTZ changed to Tenormin secondary to "cramping" though the HCTZ was controlling her B/P well. The Tenormin was not as effective and a week ago she was changed to Tenoretic. Today she noted a slow HR and decided to come to the ER. She has had some dizziness but no syncope. CT done in ER showed >50% LMCA narrowing and we are asked to see her for further evaluation.  She was seen by Dr. Tresa Endo in consultation yesterday - with plan for diagnostic LHC today for definitive Dx of ? LMCA disease.  PRE-OPERATIVE DIAGNOSIS:  Abnormal cardiac CT with > 50% LMCA Lesion; Chest Pain.  POST-OPERATIVE DIAGNOSIS:   LMCA - distal 60-70% calcified stenosis involving ostium of LCx  LAD -- Large proximal SP, at branch point of Large D1 there is a 30% lesion involving D1; small D2; rest of LAD angiographically normal  LCx - Ostial calcified ~60% lesion; Large OM1, small OM2, small AVGroove branch with distal LCx terminating as large OM3 with several small branches  RCA -- proximal ~30% at first bend; moderate RV marginal --> distal vessel gives risk to large RPDA and several small RPL branches, no significant lesions.  Well Preserved LV Function - EF ~60-65% with no WMA  Hemodynamics:   AoP:  124/62 mmHg ; 88 mmHg  LVP:  124/13MmHg ;  PROCEDURE:  Procedure(s) (LRB): LEFT HEART CATHETERIZATION WITH CORONARY ANGIOGRAM (N/A)  SURGEON:  Surgeon(s) and Role:    * Marykay Lex, MD - Primary  ANESTHESIA:   local and IV sedation; 2mg  Versed, Fentanyl MEDICATIONS:   IV Heparin 3500 Units Radial Cocktail: 1.25 mg Nicardipine, 400 mcg NTG, 2 ml 2% Lidocaine  EBL:    < 10 ml  DRAINS: 5Fr R  Radial Artery sheath; TIG 4.0 LCA & RCA; Angled Pigtail - OV Hemodynamic & LV Gram  LOCAL MEDICATIONS USED:  LIDOCAINE 2  ml  TR Band:  12 ml air 1337 hrs  DICTATION: .Note written in paper chart  PLAN OF CARE:  Admit to inpatient  transfer to Stepdown, IV NTG gtt to keep angina Free  IV Heparin to begin 6hr post sheath hemostasis.  CT Sgx consult for distal LM disease --> consider CABG vs high risk bifurcation stenting of LAD & LCx.  PATIENT DISPOSITION:  PACU - hemodynamically stable.   Delay start of Pharmacological VTE agent (>24hrs) due to surgical blood loss or risk of bleeding: not applicable  , W, M.D., M.S. [facility] [address] [place], [place]  [address]  [phone] Pager # [phone]  05/24/2011 1:55 PM

## 2011-05-24 NOTE — Consult Note (Signed)
[address]            [place] [address]          [phone]       [name] [name] [facility] Medical Record #[id number] Date of Birth: 02-12-1951  Referring: No ref. provider found Primary Care: No primary provider on file.  Chief Complaint:   Chest discomfort Chief Complaint  Patient presents with  . Hypertension    History of Present Illness:     The patient is a 61 year old Caucasian female who presented to the emergency department with dizziness palpitations and chest discomfort. EKG showed some nonspecific ST segment changes and cardiac enzymes were negative. A cardiac CT scan showed a left main stenosis and the patient was admitted to cardiology. Diagnostic cardiac catheterization was performed via the right radial artery which demonstrated 80% left main stenosis. LV function was normal with LVEDP 12 mmHg. 2-D echo is pending but there is no evidence by catheterization of aortic or mitral valvular disease. The patient is currently stable on heparin in sinus rhythm with stable hemodynamics. There is no family history of premature CAD. The patient does have a 35-pack-year history of smoking but stopped 5 years ago.  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 Hypertension   . IBS (irritable bowel syndrome)   . Anxiety   . Depression   . Complication of anesthesia     difficult waking up "  . Chronic kidney disease     cyst left kidney    Past Surgical History  Procedure Date  . Abdominal hysterectomy   . Tonsillectomy   . Tubal ligation     History  Smoking status  . Former Smoker  . Quit date: 05/23/2006  Smokeless tobacco  . Never Used    History  Alcohol Use No    History   Social History  . Marital Status: Divorced    Spouse Name: N/A    Number of Children: N/A  . Years of Education: N/A   Occupational  History  . Not on file.   Social History Main Topics  . Smoking status: Former Smoker    Quit date: 05/23/2006  . Smokeless tobacco: Never Used  . Alcohol Use: No  . Drug Use: No  . Sexually Active: Not Currently    Birth Control/ Protection: Post-menopausal   Other Topics Concern  . Not on file   Social History Narrative  . No narrative on file    Allergies  Allergen Reactions  . Levaquin Nausea And Vomiting  . Lisinopril Other (See Comments)    Excessive mucus production  . Latex Rash    Current Facility-Administered Medications  Medication Dose Route Frequency Provider Last Rate Last Dose  . 0.9 %  sodium chloride infusion  1 mL/kg/hr Intravenous Continuous Marykay Lex, MD 74.8 mL/hr at 05/24/11 1528 1 mL/kg/hr at 05/24/11 1528  . 0.9 %  sodium chloride infusion  250 mL Intravenous Continuous Marykay Lex, MD      . acetaminophen (TYLENOL) tablet 650 mg  650 mg Oral Q4H PRN Marykay Lex, MD      . ALPRAZolam Prudy Feeler) tablet 0.25-0.5 mg  0.25-0.5 mg Oral Q4H PRN Kerin Perna, MD      . aspirin chewable tablet  324 mg  324 mg Oral [address] [place], [place]   324 mg at 05/24/11 0631  . atorvastatin (LIPITOR) tablet 40 mg  40 mg Oral Once Abelino Derrick, [place]   40 mg at 05/23/11 1924  . diazepam (VALIUM) tablet 5 mg  5 mg Oral On Call Abelino Derrick, PA   5 mg at 05/24/11 0818  . fentaNYL (SUBLIMAZE) 0.05 MG/ML injection           . heparin 1000 UNIT/ML injection           . heparin 2-0.9 UNIT/ML-% infusion           . heparin ADULT infusion 100 units/mL (25000 units/250 mL)  1,000 Units/hr Intravenous Continuous Lennette Bihari, MD      . lidocaine (XYLOCAINE) 1 % injection           . metoprolol tartrate (LOPRESSOR) tablet 12.5 mg  12.5 mg Oral BID Eda Paschal [place], [place]      . midazolam (VERSED) 2 MG/2ML injection           . morphine 2 MG/ML injection 1 mg  1 mg Intravenous Q2H PRN Marykay Lex, MD   1 mg at 05/24/11 1525  . mulitivitamin with minerals tablet 1  tablet  1 tablet Oral Daily Eda Paschal [place], [place]   1 tablet at 05/24/11 1610  . nitroGLYCERIN (NITROSTAT) SL tablet 0.4 mg  0.4 mg Sublingual Q5 min PRN Lisette Paz, PA-C      . nitroGLYCERIN (NTG ON-CALL) 0.2 mg/mL injection           . nitroGLYCERIN 0.2 mg/mL in dextrose 5 % infusion  2-200 mcg/min Intravenous Continuous Marykay Lex, MD 3 mL/hr at 05/24/11 1408 10 mcg/min at 05/24/11 1408  . ondansetron (ZOFRAN) injection 4 mg  4 mg Intravenous Q6H PRN Marykay Lex, MD      . pantoprazole (PROTONIX) EC tablet 40 mg  40 mg Oral [address] [place], [place]   40 mg at 05/24/11 0631  . potassium chloride SA (K-DUR,KLOR-CON) CR tablet 40 mEq  40 mEq Oral Once Marykay Lex, MD   40 mEq at 05/24/11 0842  . potassium chloride SA (K-DUR,KLOR-CON) CR tablet 40 mEq  40 mEq Oral Once Marykay Lex, MD   40 mEq at 05/24/11 1107  . sodium chloride 0.9 % injection 3 mL  3 mL Intravenous Q12H Marykay Lex, MD      . sodium chloride 0.9 % injection 3 mL  3 mL Intravenous PRN Marykay Lex, MD      . zolpidem [facility]) tablet 10 mg  10 mg Oral QHS PRN Abelino Derrick, PA      . DISCONTD: 0.9 %  sodium chloride infusion  1,000 mL Intravenous Continuous Lisette Paz, PA-C 125 mL/hr at 05/22/11 1942 1,000 mL at 05/22/11 1942  . DISCONTD: 0.9 %  sodium chloride infusion  250 mL Intravenous PRN Abelino Derrick, PA      . DISCONTD: 0.9 %  sodium chloride infusion  1 mL/kg/hr Intravenous Continuous Abelino Derrick, PA 74.8 mL/hr at 05/24/11 0818 1 mL/kg/hr at 05/24/11 0818  . DISCONTD: acetaminophen (TYLENOL) tablet 650 mg  650 mg Oral Q4H PRN Abelino Derrick, PA   650 mg at 05/24/11 1107  . DISCONTD: ALPRAZolam Prudy Feeler) tablet 0.25 mg  0.25 mg Oral TID PRN Abelino Derrick, PA      . DISCONTD: atorvastatin (LIPITOR) tablet 40 mg  40 mg Oral q1800 Eda Paschal [place], [place]      . DISCONTD: isosorbide mononitrate (IMDUR) 24 hr tablet 30 mg  30 mg Oral Daily Eda Paschal [place], [place]   30 mg at 05/24/11 5621  . DISCONTD: ondansetron (ZOFRAN)  injection 4 mg  4 mg Intravenous Q6H PRN Abelino Derrick, PA   4 mg at 05/24/11 1423  . DISCONTD: sodium chloride 0.9 % injection 3 mL  3 mL Intravenous PRN Abelino Derrick, PA        Prescriptions prior to admission  Medication Sig Dispense Refill  . atenolol-chlorthalidone (TENORETIC) 100-25 MG per tablet Take 1 tablet by mouth daily.      . Cholecalciferol (VITAMIN D-3 PO) Take 1 capsule by mouth daily.      . Multiple Vitamin (MULITIVITAMIN WITH MINERALS) TABS Take 1 tablet by mouth daily.        History reviewed. No pertinent family history.   Review of Systems:     Cardiac Review of Systems: Y or N  Chest Pain Cove.Etienne    ]  Resting SOB [ n  ] Exertional SOB  [  ]  Pollyann Kennedy Milo.Brash  ]   Pedal Edema [   ]    Palpitations Cove.Etienne  ] Syncope  [  ]   Presyncope [   ]  General Review of Systems: [Y] = yes [  ]=no Constitional: recent weight change [ n ]; anorexia [  ]; fatigue [  ]; nausea [  ]; night sweats [n  ]; fever [ n ]; or chills [  ];                                                                                                                                          Dental: poor dentition[  ]; Last Dentist visit: One year  Eye : blurred vision [  ]; diplopia [   ]; vision changes [  ];  Amaurosis fugax[ n ]; Resp: cough [  ];  wheezing[  ];  hemoptysis[  ]; shortness of breath[  ]; paroxysmal nocturnal dyspnea[  ]; dyspnea on exertion[  ]; or orthopnea[  ];  GI:  gallstones[  ], vomiting[  ];  dysphagia[  ]; melena[  ];  hematochezia [  ]; heartburn[  ];   Hx of  Colonoscopy[  ]; GU: kidney stones [n  ]; hematuria[ n ];   dysuria [  ];  nocturia[  ];  history of     obstruction [  ];             Skin: rash, swelling[  ];, hair loss[  ];  peripheral edema[  ];  or itching[  ]; Musculosketetal: myalgias[  ];  joint swelling[  ];  joint erythema[  ];  joint pain[  ];  back pain[  ];  Heme/Lymph: bruising[  ];  bleeding[  ];  anemia[  ];  Neuro: TIA[  ];  headaches[  ];  stroke[ n ];  vertigo[   ];  seizures[  ];   paresthesias[  ];  difficulty walking[  ];  Psych:depression[  ]; anxiety[  ];  Endocrine: diabetes[  ];  thyroid dysfunction[n  ];  Immunizations: Flu [  ]; Pneumococcal[  ];  Other:  Physical Exam: BP 113/56  Pulse 57  Temp(Src) 97.8 F (36.6 C) (Oral)  Resp 15  Ht 5\' 4"  (1.626 m)  Wt 165 lb (74.844 kg)  BMI 28.32 kg/m2  SpO2 95%  Exam Gen.-Alert oriented anxious in the CCU accompanied by daughter HEENT-normocephalic pupils equal good dentition Neck-no JVD mass or carotid bruit Lymphatics-no palpable adenopathy in neck or supraclavicular fossa Chest-clear breath sounds no deformity or tenderness Cardiac-regular rhythm without murmur rub or gallop Abdomen soft nontender without pulsatile mass Extremity-no clubbing cyanosis or edema good pulses  dressing right wrist from radial artery cath Vascular-palpable pedal pulses no evidence of venous insufficiency Neurologic alert and oriented no focal motor deficit   Diagnostic Studies & Laboratory data:   Cardiac catheterization reviewed showing left main stenosis good LV function Recent Radiology Findings: Portable chest x-ray shows no active pulmonary disease  Ct Heart Morp W/cta Cor W/score W/ca W/cm &/or Wo/cm  05/23/2011  *RADIOLOGY REPORT*  INDICATION:  Chest pain.  Coronary artery disease.  CT ANGIOGRAPHY OF THE HEART, CORONARY ARTERY, STRUCTURE, AND MORPHOLOGY  CONTRAST:  80 ml Omnipaque 350.  COMPARISON:  None  TECHNIQUE:  CT angiography of the coronary vessels was performed on a 256 channel system using prospective ECG gating.  A scout and noncontrast exam (for calcium scoring) were performed.  Circulation time was measured using a test bolus.  Coronary CTA was performed with sub mm slice collimation during portions of the cardiac cycle after prior injection of iodinated contrast.  Imaging post processing was performed on an independent workstation creating multiplanar and 3-D images, and quantitative analysis  of the heart and coronary arteries.  Note that this exam targets the heart and the chest was not imaged in its entirety.  PREMEDICATION: Lopressor 0 mg, P.O. Lopressor 0 mg, IV Nitroglycerin 0.4 mcg, sublingual.  FINDINGS: Technical quality:  Good  Heart rate:  44  CORONARY ARTERIES: Left main coronary artery:  There is high-grade stenosis, greater than 50% in the proximal left main coronary artery.  There is a mixture of hard plaque and complex soft plaque. Left anterior descending:  There is some step-off artifact in the mid LAD however there is no hemodynamically significant stenosis. There may be a tiny amount of soft plaque of opposing the first diagonal takeoff. Left circumflex:  Patent. Right coronary artery:  Tiny amount of calcified ostial plaque is present.  Artifact is present in the proximal to mid right coronary artery.  There is no hemodynamically significant stenosis. Posterior descending artery:  Patent. Dominance:  Right  CORONARY CALCIUM: Total Agatston Score:  72 MESA database percentile:  83  CARDIAC MEASUREMENTS: Interventricular septum (6 - 12 mm):  8 mm LV posterior wall (6 - 12 mm):  11 mm LV diameter in diastole (35 - 52 mm):  35 mm  AORTA AND PULMONARY MEASUREMENTS: Aortic root (21 - 40 mm):             23 mm  at the annulus             31 mm  at the sinuses of Valsalva  27 mm  at the sinotubular junction Ascending aorta ( <  40 mm):  29 mm Descending aorta ( <  40 mm):  23 mm Main pulmonary artery:  ( <  30 mm):  28 mm  EXTRACARDIAC FINDINGS: Esophageal thickening is present with a probable small hiatal hernia suggesting gastroesophageal reflux disease.  No pleural or pericardial effusion.  Scarring is present in the lingula and right middle lobe adjacent to the heart border.  IMPRESSION:  1.  Age advanced coronary artery disease.  The patient's total coronary artery calcium score is 72, which is 83 percentile for patient's matched age and gender. 2.  High-grade stenosis of the  left main coronary artery, greater than 50%.  Cardiology consultation recommended.  This is a mixture of hard and soft plaque.  On curve multiplanar reformatted images, the stenosis appears closer to the 60-70%. 3.  Right coronary artery dominance. 4.  Borderline size of the main pulmonary artery. 5.  Patulous gastroesophageal junction and distal esophageal thickening suggesting gastroesophageal reflux disease.  Report was called to physician's assistant Trixie Dredge at 1130 hours on 05/23/2011.  Original Report Authenticated By: Andreas Newport, M.D.   Dg Chest Portable 1 View  05/22/2011  *RADIOLOGY REPORT*  Clinical Data: Hypertension, chest heaviness.  PORTABLE CHEST - 1 VIEW  Comparison: 01/24/2009  Findings: Normal cardiac and mediastinal silhouette.  Clear lung fields.  No acute bony abnormalities are identified.  No focal airspace disease, effusion, or pneumothorax.  Little change from priors.  IMPRESSION: Negative chest.  Original Report Authenticated By: Elsie Stain, M.D.      Recent Lab Findings: Lab Results  Component Value Date   WBC 7.7 05/24/2011   HGB 12.6 05/24/2011   HCT 38.1 05/24/2011   PLT 358 05/24/2011   GLUCOSE 93 05/24/2011   ALT 10 05/23/2011   AST 13 05/23/2011   NA 139 05/24/2011   K 3.1* 05/24/2011   CL 103 05/24/2011   CREATININE 0.64 05/24/2011   BUN 13 05/24/2011   CO2 25 05/24/2011   TSH 0.635 05/23/2011   INR 0.98 05/23/2011      Assessment / Plan:     The patient has significant left main stenosis and will be prepared for multivessel bypass grafting on April 12. The procedure indications benefits and expected recovery discussed with patient. We'll review the results of her 2-D echo and Doppler carotid studies and PFTs tomorrow.       @me1 @ 05/24/2011 6:33 PM

## 2011-05-24 NOTE — CV Procedure (Signed)
[facility]     CARDIAC CATHETERIZATION REPORT   [name] [name]   MRN: [id number] [birth year]   ADMIT DATE:  05/22/2011  Performing Cardiologist: Marykay Lex Primary Physician: No primary provider on file. Primary Cardiologist:  Lennette Bihari, MD  Procedures Performed:  Left Heart Catheterization via 5 Fr Right Radial Artery access  Left Ventriculography, (RAO) 12.5 ml/sec for 25 ml total contrast  Native Coronary Angiography  Indication(s): Exertional Chest Pain; Abnormal Cardiac CTA - with ? LMCA stenosis    History: 61 y.o. female with HTN, and is a former smoker who presented to the ER with chest discomfort and low resting HR (she was recently started onTenoretic).  Cardiac CTA performed in the ER revealed a concerning calcified distal Left Main lesion.  She is therefore referred for definitive diagnosis with coronary angiography.  Consent: The procedure with Risks/Benefits/Alternatives and Indications was reviewed with the patient  and family.  All questions were answered.    Risks / Complications include, but not limited to: Death, MI, CVA/TIA, VF/VT (with defibrillation), Bradycardia (need for temporary pacer placement), contrast induced nephropathy, bleeding / bruising / hematoma / pseudoaneurysm, vascular or coronary injury (with possible emergent CT or Vascular Surgery), adverse medication reactions, infection.    The patient (and family) voice understanding and agree to proceed.    Consent for signed by MD and patient with RN witness -- placed on chart.  Procedure: The patient was brought to the 2nd Floor [facility] in the fasting state and prepped and draped in the usual sterile fashion for Right groin or radial) access. Sterile technique was used including antiseptics, cap, gloves, gown, hand hygiene, mask, sheet and radial draping..  Skin prep: Chlorhexidine;  Time Out: Verified patient identification, verified  procedure, site/side was marked, verified correct patient position, special equipment/implants available, medications/allergies/relevent history reviewed, required imaging and test results available.  Performed  The right wrist was anesthetized with 1% subcutaneous Lidocaine.  The right radial artery was accessed using the Seldinger Technique with placement of a 5 Fr Glide Sheath. The sheath was aspirated and flushed.  Then a total of 10 ml of standard Radial Artery Cocktail (see medications) was infused.  Radial Cocktail: 1.25 mg Nicardipine, 400 mcg NTG, 2 ml 2% Lidocaine  A 5 Fr TIG 4.0 Catheter was advanced of over a Versicore wire into the ascending Aorta and was used to engage the Left then Right and Coronary Artery.  Multiple cineangiographic views of the both coronary artery system(s) were performed.   This catheter was then exchanged over the Long Exchange Safety J wire for an angled Pigtail catheter that was advanced across the Aortic Valve.  LV hemodynamics were measured and Left Ventriculography was performed.  LV hemodynamics were then re-sampled, and the catheter was pulled back across the Aortic Valve for measurement of "pull-back" gradient.  The catheter and wire were removed completely out of the body.  The sheath was removed in the Cath Lab with a TR band placed at 12 ml Air at 1337 hrs (time).  Reverse Allen's test revealed non-occlusive hemostasis.  The patient was transported to the PACU in a hemodynamically stable & chest pain free condition.   The patient  was stable before, during and following the procedure.   Patient did tolerate procedure well. There were not complications.  EBL: < 10 ml  Medications:  Premedication: 5 mg  Valium,  Sedation:  2 mg IV Versed, 25 IV mcg Fentanyl  Contrast:  70ml Omnipaque  Radial Cocktail: 1.25 mg Nicardipine, 400 mcg NTG, 2 ml 2% Lidocaine  IV Heparin: 3500 Units  Hemodynamics:  Central Aortic Pressure / Mean Aortic Pressure: 124/62  mmHg ; 88 mmHg  LV Pressure / LV End diastolic Pressure:  124/13MmHg ;  Left Ventriculography:  EF:  60-65%  Wall Motion: normal  Coronary Angiographic Data:  Left Main:  Moderate to large caliber vessel that trifurcates into a small Ramus Intermedius as well as the LAD and Left Circumflex; the distal vessel tapers into a 60-70% lesion that is calcified, this lesion includes the ostium of the Left Circumflex.  Left Anterior Descending (LAD):  Moderate caliber vessel; ~20-30% lesion at the Diag 1 (D1) branch point, otherwise minimal luminal irregularities.  1st diagonal (D1):  Small to moderate caliber vessel, minimal luminal irregularities  2nd diagonal (D2):  Small  caliber vessel, minimal luminal irregularities  Circumflex (LCx):  Moderate caliber vessel; ostial, calcified ~60% lesion  1st obtuse marginal:  Moderate caliber vessel, minimal luminal irregularities  2nd obtuse marginal:  Small caliber vessel, minimal luminal irregularities Distal LCx /3rd obtuse marginal:  Moderate caliber vessel with multiple branches; minimal luminal irregularities   Small AV Groove Branch:  Minimal luminal irregularities  Ramus Intermedius:  Very small caliber vessel, minimal luminal irregularities  Right Coronary Artery: Moderate caliber vessel; proximal 30% at first bend, 20% mid vessel then minimal luminal irregularities  right ventricle branch of right coronary artery: Small to moderate caliber vessel, minimal luminal irregularities  posterior descending artery: Moderate caliber vessel, minimal luminal irregularities  posterior lateral branches:  Moderate caliber vessel, minimal luminal irregularities  Impression:  Severe calcified distal LMCA lesion consistent with CT Angiography with involvement of the Ostial Left Circumflex  Great distal targets with minimal to mild CAD in remaining coronary arteries  Plan:  CT Surgical consultation for distal LMCA-LCx stenosis  Restart  IV Heparin 6 hrs after TR Band off  Continue IV Nitroglycerin  Continue aggressive cardiac risk factor modification  The case and results was discussed with the patient (and family). The case and results was not discussed with the patient's PCP. The case and results was discussed with the patient's Cardiologist.  Time Spend Directly with Patient:  35 minutes  HARDING,DAVID W, M.D., M.S. [facility] [address] [place], [place]  [address]  [phone]  05/24/2011 9:31 PM

## 2011-05-24 NOTE — Progress Notes (Signed)
History and Physical Interval Note:  NAME:  [name] [name]: [id number] DOB:  Nov 26, 1950   ADMIT DATE: 05/22/2011   05/24/2011 12:48 PM  [name] [name] is a 61 y.o. female seen initially in 2010 for chest pain, Myoview then negative. She saw [name] [name] once in follow up but has no insurance and could not afford to keep subsequent appointments. She is followed at [name] [name]. About two weeks ago she had [name] [name] changed to [name] [name] secondary to "cramping" though the [name] [name] was controlling her B/P well. The [name] [name] was not as effective and a week ago she was changed to [name] [name]. Today she noted a slow HR and decided to come to the ER. She has had some dizziness but no syncope. CT done in ER showed >50% LMCA narrowing and we are asked to see her for further evaluation.  She was seen by [name] [name] in consultation yesterday - with plan for diagnostic LHC today for definitive Dx of ? LMCA disease.   Past Medical History  Diagnosis Date  . Hypertension   . IBS (irritable bowel syndrome)   . Anxiety   . Depression   . Complication of anesthesia     difficult waking up "  . Chronic kidney disease     cyst left kidney   Past Surgical History  Procedure Date  . Abdominal hysterectomy   . Tonsillectomy   . Tubal ligation     FAMHx: History reviewed. No pertinent family history.  SOCHx:  reports that she quit smoking about 5 years ago. She has never used smokeless tobacco. She reports that she does not drink alcohol or use illicit drugs.  ALLERGIES: Allergies  Allergen Reactions  . Levaquin Nausea And Vomiting  . Lisinopril Other (See Comments)    Excessive mucus production  . Latex Rash    CURRENT MEDICATIONS: Current Facility-Administered Medications  Medication Dose Route Frequency Provider Last Rate Last Dose  . 0.9 %  sodium chloride infusion  1,000 mL Intravenous Continuous [name] [name], [name] [name] 125 mL/hr at 05/22/11 1942 1,000 mL at 05/22/11 1942  . 0.9 %  sodium chloride infusion   250 mL Intravenous PRN [name] [name], [name] [name]      . 0.9 %  sodium chloride infusion  1 mL/kg/hr Intravenous Continuous [name] [name], [name] [name] 74.8 mL/hr at 05/24/11 0818 1 mL/kg/hr at 05/24/11 0818  . acetaminophen (TYLENOL) tablet 650 mg  650 mg Oral Q4H PRN [name] [name], [name] [name]   650 mg at 05/24/11 1107  . ALPRAZolam Prudy Feeler) tablet 0.25 mg  0.25 mg Oral TID PRN [name] [name], [name] [name]      . aspirin chewable tablet 324 mg  324 mg Oral [address] [place], [name] [name]   324 mg at 05/24/11 0631  . atorvastatin (LIPITOR) tablet 40 mg  40 mg Oral Once [name] [name], [name] [name]   40 mg at 05/23/11 1924   Followed by  . atorvastatin (LIPITOR) tablet 40 mg  40 mg Oral q1800 [name] [name], [name] [name]      . diazepam (VALIUM) tablet 5 mg  5 mg Oral On Call [name] [name], [name] [name]   5 mg at 05/24/11 0818  . isosorbide mononitrate (IMDUR) 24 hr tablet 30 mg  30 mg Oral Daily [name] [name], [name] [name]   30 mg at 05/24/11 0865  . metoprolol tartrate (LOPRESSOR) tablet 12.5 mg  12.5 mg Oral BID [name] [name], [name] [name]      . mulitivitamin with minerals tablet 1 tablet  1 tablet Oral Daily [name] [name], [name] [name]   1 tablet at 05/24/11 1610  . nitroGLYCERIN (NITROSTAT) SL tablet 0.4 mg  0.4 mg Sublingual Q5 min PRN [name] [name], [name] [name]      . ondansetron (ZOFRAN) injection 4 mg  4 mg Intravenous Q6H PRN [name] [name], [name] [name]      . pantoprazole (PROTONIX) EC tablet 40 mg  40 mg Oral Q0600 [name] [name], [name] [name]   40 mg at 05/24/11 0631  . potassium chloride SA (K-DUR,KLOR-CON) CR tablet 40 mEq  40 mEq Oral Once [name] Lex, MD   40 mEq at 05/24/11 0842  . potassium chloride SA (K-DUR,KLOR-CON) CR tablet 40 mEq  40 mEq Oral Once [name] Lex, MD   40 mEq at 05/24/11 1107  . sodium chloride 0.9 % injection 3 mL  3 mL Intravenous PRN [name] [name], [name] [name]      . zolpidem (AMBIEN) tablet 10 mg  10 mg Oral QHS PRN [name] [name], [name] [name]        PHYSICAL EXAM:Blood pressure 105/64, pulse 49, temperature 98 F (36.7 C), temperature source Oral, resp. rate 18, height 5\' 4"  (1.626 m), weight  74.844 kg (165 lb), SpO2 96.00%. General appearance: alert, cooperative, appears stated age and no distress Neck: no adenopathy, no carotid bruit, no JVD, supple, symmetrical, trachea midline and thyroid not enlarged, symmetric, no tenderness/mass/nodules Lungs: mostly CTAB, non-labored, faint basal crackles Heart: regular rate and rhythm, S1, S2 normal, no murmur, click, rub or gallop Abdomen: normal findings: bowel sounds normal, no bruits heard, no masses palpable, no organomegaly and spleen non-palpable Extremities: extremities normal, atraumatic, no cyanosis or edema Pulses: 2+ and symmetric Neurologic: Alert and oriented X 3, normal strength and tone. Normal symmetric reflexes. Normal coordination and gait  IMPRESSION & PLAN The patients' history has been reviewed, patient examined, no change in status from most recent note, stable for surgery. I have reviewed the patients' chart and labs. Questions were answered to the patient's satisfaction.    [name] [name] has presented today for cardiac catheterization, with the diagnosis of chest pain T& abnormal CTA Heart with ? LM stenosis.   The various methods of treatment have been discussed with the patient and family.  Risks / Complications include, but not limited to: Death, MI, CVA/TIA, VF/VT (with defibrillation), Bradycardia (need for temporary pacer placement), contrast induced nephropathy, bleeding / bruising / hematoma / pseudoaneurysm, vascular or coronary injury (with possible emergent CT or Vascular Surgery), adverse medication reactions, infection.    After consideration of risks, benefits and other options for treatment, the patient has consented to Procedure(s):  LEFT HEART CATHETERIZATION AND CORONARY ANGIOGRAPHY +/- AD HOC PERCUTANEOUS CORONARY INTERVENTION  as a surgical intervention.   We will proceed with the planned procedure.   Eber Ferrufino W [facility] [address] [place], [place]  [address]  [phone]  05/24/2011 12:48 PM

## 2011-05-25 ENCOUNTER — Inpatient Hospital Stay (HOSPITAL_COMMUNITY): Payer: Medicaid Other

## 2011-05-25 DIAGNOSIS — Z01810 Encounter for preprocedural cardiovascular examination: Secondary | ICD-10-CM

## 2011-05-25 LAB — CBC
HCT: 38.6 % (ref 36.0–46.0)
Hemoglobin: 12.7 g/dL (ref 12.0–15.0)
MCH: 27.7 pg (ref 26.0–34.0)
MCHC: 32.9 g/dL (ref 30.0–36.0)
MCV: 84.1 fL (ref 78.0–100.0)
Platelets: 339 10*3/uL (ref 150–400)
RBC: 4.59 MIL/uL (ref 3.87–5.11)
RDW: 13.7 % (ref 11.5–15.5)
WBC: 7.6 10*3/uL (ref 4.0–10.5)

## 2011-05-25 LAB — LIPID PANEL
Cholesterol: 143 mg/dL (ref 0–200)
HDL: 49 mg/dL (ref >39)
LDL Cholesterol: 68 mg/dL (ref 0–99)
Total CHOL/HDL Ratio: 2.9 RATIO
Triglycerides: 130 mg/dL (ref <150)
VLDL: 26 mg/dL (ref 0–40)

## 2011-05-25 LAB — PULMONARY FUNCTION TEST

## 2011-05-25 LAB — HEMOGLOBIN A1C
Hgb A1c MFr Bld: 5.8 % — ABNORMAL HIGH (ref <5.7)
Mean Plasma Glucose: 120 mg/dL — ABNORMAL HIGH (ref <117)

## 2011-05-25 LAB — TYPE AND SCREEN
ABO/RH(D): O POS
Antibody Screen: NEGATIVE

## 2011-05-25 LAB — HEPARIN LEVEL (UNFRACTIONATED): Heparin Unfractionated: 0.35 IU/mL (ref 0.30–0.70)

## 2011-05-25 LAB — ABO/RH: ABO/RH(D): O POS

## 2011-05-25 MED ORDER — TRANEXAMIC ACID (OHS) PUMP PRIME SOLUTION
2.0000 mg/kg | INTRAVENOUS | Status: DC
  Filled 2011-05-25: qty 1.5

## 2011-05-25 MED ORDER — PHENYLEPHRINE HCL 10 MG/ML IJ SOLN
30.0000 ug/min | INTRAVENOUS | Status: DC
  Filled 2011-05-25: qty 2

## 2011-05-25 MED ORDER — POTASSIUM CHLORIDE 2 MEQ/ML IV SOLN
80.0000 meq | INTRAVENOUS | Status: DC
  Filled 2011-05-25: qty 40

## 2011-05-25 MED ORDER — SODIUM CHLORIDE 0.9 % IV SOLN
INTRAVENOUS | Status: AC
  Administered 2011-05-26: 1.1 [IU]/h via INTRAVENOUS
  Filled 2011-05-25: qty 1

## 2011-05-25 MED ORDER — DEXTROSE 5 % IV SOLN
750.0000 mg | INTRAVENOUS | Status: DC
  Filled 2011-05-25: qty 750

## 2011-05-25 MED ORDER — DEXTROSE 5 % IV SOLN
1.5000 g | INTRAVENOUS | Status: AC
  Administered 2011-05-26: 1.5 g via INTRAVENOUS
  Administered 2011-05-26: 750 g via INTRAVENOUS
  Filled 2011-05-25: qty 1.5

## 2011-05-25 MED ORDER — TEMAZEPAM 15 MG PO CAPS
15.0000 mg | ORAL_CAPSULE | Freq: Once | ORAL | Status: AC | PRN
  Administered 2011-05-25: 15 mg via ORAL
  Filled 2011-05-25: qty 1

## 2011-05-25 MED ORDER — METOPROLOL TARTRATE 12.5 MG HALF TABLET
12.5000 mg | ORAL_TABLET | Freq: Once | ORAL | Status: DC
  Filled 2011-05-25: qty 1

## 2011-05-25 MED ORDER — CHLORHEXIDINE GLUCONATE 4 % EX LIQD
60.0000 mL | Freq: Once | CUTANEOUS | Status: AC
  Administered 2011-05-25: 4 via TOPICAL
  Filled 2011-05-25: qty 60

## 2011-05-25 MED ORDER — ALBUTEROL SULFATE (5 MG/ML) 0.5% IN NEBU
2.5000 mg | INHALATION_SOLUTION | Freq: Once | RESPIRATORY_TRACT | Status: AC
  Administered 2011-05-25: 2.5 mg via RESPIRATORY_TRACT

## 2011-05-25 MED ORDER — VANCOMYCIN HCL 1000 MG IV SOLR: 1250.0000 mg | INTRAVENOUS | Status: DC

## 2011-05-25 MED ORDER — NITROGLYCERIN IN D5W 200-5 MCG/ML-% IV SOLN
2.0000 ug/min | INTRAVENOUS | Status: DC
  Filled 2011-05-25: qty 250

## 2011-05-25 MED ORDER — TRANEXAMIC ACID (OHS) BOLUS VIA INFUSION
15.0000 mg/kg | INTRAVENOUS | Status: DC
  Filled 2011-05-25: qty 1122

## 2011-05-25 MED ORDER — MAGNESIUM SULFATE 50 % IJ SOLN
40.0000 meq | INTRAMUSCULAR | Status: DC
  Filled 2011-05-25: qty 10

## 2011-05-25 MED ORDER — BISACODYL 5 MG PO TBEC: 5.0000 mg | DELAYED_RELEASE_TABLET | Freq: Once | ORAL | Status: DC

## 2011-05-25 MED ORDER — EPINEPHRINE HCL 1 MG/ML IJ SOLN
0.5000 ug/min | INTRAVENOUS | Status: DC
  Filled 2011-05-25: qty 4

## 2011-05-25 MED ORDER — DEXTROSE 5 % IV SOLN: 1.5000 g | INTRAVENOUS | Status: DC

## 2011-05-25 MED ORDER — DOPAMINE-DEXTROSE 3.2-5 MG/ML-% IV SOLN
2.0000 ug/kg/min | INTRAVENOUS | Status: DC
  Filled 2011-05-25: qty 250

## 2011-05-25 MED ORDER — VANCOMYCIN HCL 1000 MG IV SOLR
1250.0000 mg | INTRAVENOUS | Status: AC
  Administered 2011-05-26: 1250 mg via INTRAVENOUS
  Filled 2011-05-25: qty 1250

## 2011-05-25 MED ORDER — TRANEXAMIC ACID 100 MG/ML IV SOLN
1.5000 mg/kg/h | INTRAVENOUS | Status: AC
  Administered 2011-05-26: 15 mg/kg/h via INTRAVENOUS
  Filled 2011-05-25: qty 25

## 2011-05-25 MED ORDER — SODIUM CHLORIDE 0.9 % IV SOLN
0.1000 ug/kg/h | INTRAVENOUS | Status: AC
  Administered 2011-05-26: .2 ug/kg/h via INTRAVENOUS
  Filled 2011-05-25: qty 4

## 2011-05-25 MED ORDER — CHLORHEXIDINE GLUCONATE 4 % EX LIQD
60.0000 mL | Freq: Once | CUTANEOUS | Status: AC
  Administered 2011-05-26: 4 via TOPICAL
  Filled 2011-05-25: qty 60

## 2011-05-25 MED ORDER — SODIUM BICARBONATE 8.4 % IV SOLN
INTRAVENOUS | Status: AC
  Administered 2011-05-26: 09:00:00
  Filled 2011-05-25: qty 2.5

## 2011-05-25 MED ORDER — CHLORHEXIDINE GLUCONATE 4 % EX LIQD: 60.0000 mL | Freq: Once | CUTANEOUS | Status: AC

## 2011-05-25 MED FILL — Nicardipine HCl IV Soln 2.5 MG/ML: INTRAVENOUS | Qty: 1 | Status: AC

## 2011-05-25 NOTE — Progress Notes (Signed)
UR Completed. Simmons, Hanz Winterhalter F [phone]  

## 2011-05-25 NOTE — Progress Notes (Addendum)
VASCULAR LAB PRELIMINARY  PRELIMINARY  PRELIMINARY  PRELIMINARY  Pre-op Cardiac Surgery  Carotid Findings:  No evidence of significant ICA stenosis.  Vertebral artery flow is antegrade bilaterally.    Upper Extremity Right Left  Brachial Pressures 134 T T  Radial Waveforms T T  Ulnar Waveforms T T  Palmar Arch (Allen's Test) * *   Findings:  *Doppler waveforms remain normal with ulnar and radial compressions bilaterally.      Lower  Extremity Right Left  Dorsalis Pedis    Anterior Tibial    Posterior Tibial    Ankle/Brachial Indices      Findings:  Palpable pedal pulses x 4.     [name] [name], 05/25/2011, 12:39 PM

## 2011-05-25 NOTE — Progress Notes (Signed)
L Main dz stable on iv heparin PFT,CXR, carotid dopplers ok Cabg in AM

## 2011-05-25 NOTE — Progress Notes (Signed)
PFT done at bedside. Unconfirmed copy placed in shadow chart.  Inocente Salles RRT, RCP 05/25/2011 9:34 AM

## 2011-05-25 NOTE — Progress Notes (Signed)
Clinical Social Work Department BRIEF PSYCHOSOCIAL ASSESSMENT 05/25/2011  Patient:  [name] [name], [name] [name]     Account Number:  [id number]     Admit date:  05/22/2011  Clinical Social Worker:  Juliette Mangle  Date/Time:  05/25/2011 02:00 PM  Referred by:  Physician  Date Referred:  05/24/2011 Referred for  Other - See comment   Other Referral:   Financial assistance   Interview type:  Patient Other interview type:   Daughter    PSYCHOSOCIAL DATA Living Status:  ALONE Admitted from facility:   Level of care:   Primary support name:  Ladora Daniel Primary support relationship to patient:  CHILD, ADULT Degree of support available:   Good    CURRENT CONCERNS Current Concerns  Financial Resources   Other Concerns:    SOCIAL WORK ASSESSMENT / PLAN CSW met with patient and patient's daughter at bedside to offer support and discuss patient's current financial concerns. Patient spoke with a financial counselor from the hospital prior to CSW meeting with the patient. The financial counselor told the patient that she would not qualify for SSI or Medicaid. CSW, however, provided patient with a SSI/Medicaid packet and answered all the patient's questions/concerns about food stamps, rent assistance, etc.. CSW also provided patient with resources/phone numbers to the [place] housing commission, [employer], and Corporate treasurer. Patient reported being very anxious and CSW provided emotional assistance and tried to normalize patient's feelings. CSW addressed all concerns of the patient and the daughter. Clinical Social Work will sign off as Social Work intervention is no longer needed.   Assessment/plan status:  No Further Intervention Required Other assessment/ plan:   Information/referral to community resources:   Case management for medication assistance    PATIENT'S/FAMILY'S RESPONSE TO PLAN OF CARE: Patient and patient's daughter were very appreciative and receptive to all  information provided by CSW     Sabino Niemann, MSW, [place] [phone]

## 2011-05-25 NOTE — Progress Notes (Signed)
  Echocardiogram 2D Echocardiogram has been performed.  Jorje Guild [facility] 05/25/2011, 9:52 AM

## 2011-05-25 NOTE — Progress Notes (Signed)
[facility]  Subjective: No CP or SOB   Objective: Vital signs in last 24 hours: Temp:  [97.7 F (36.5 C)-98.1 F (36.7 C)] 97.7 F (36.5 C) (04/11 0400) Pulse Rate:  [48-59] 54  (04/10 2213) Resp:  [8-20] 20  (04/11 0700) BP: (94-120)/(33-64) 120/59 mmHg (04/11 0700) SpO2:  [93 %-98 %] 98 % (04/11 0000) Last BM Date: 05/22/11  Intake/Output from previous day: 04/10 0701 - 04/11 0700 In: 1746.8 [P.O.:900; I.V.:846.8] Out: 2825 [Urine:2825] Intake/Output this shift:    Medications Current Facility-Administered Medications  Medication Dose Route Frequency Provider Last Rate Last Dose  . 0.9 %  sodium chloride infusion  1 mL/kg/hr Intravenous Continuous Marykay Lex, MD 74.8 mL/hr at 05/24/11 1528 1 mL/kg/hr at 05/24/11 1528  . 0.9 %  sodium chloride infusion  250 mL Intravenous Continuous Marykay Lex, MD 10 mL/hr at 05/24/11 2118 250 mL at 05/24/11 2118  . acetaminophen (TYLENOL) tablet 650 mg  650 mg Oral Q4H PRN Marykay Lex, MD      . ALPRAZolam Prudy Feeler) tablet 0.25-0.5 mg  0.25-0.5 mg Oral Q4H PRN Kerin Perna, MD   0.25 mg at 05/24/11 2234  . diazepam (VALIUM) tablet 5 mg  5 mg Oral On Call Abelino Derrick, PA   5 mg at 05/24/11 0818  . fentaNYL (SUBLIMAZE) 0.05 MG/ML injection           . heparin 1000 UNIT/ML injection           . heparin 2-0.9 UNIT/ML-% infusion           . heparin ADULT infusion 100 units/mL (25000 units/250 mL)  1,000 Units/hr Intravenous Continuous Lennette Bihari, MD 10 mL/hr at 05/24/11 2142 1,000 Units/hr at 05/24/11 2142  . lidocaine (XYLOCAINE) 1 % injection           . metoprolol tartrate (LOPRESSOR) tablet 12.5 mg  12.5 mg Oral BID Eda Paschal [place], [place]      . midazolam (VERSED) 2 MG/2ML injection           . morphine 2 MG/ML injection 1 mg  1 mg Intravenous Q2H PRN Marykay Lex, MD   1 mg at 05/24/11 1525  . mulitivitamin with minerals tablet 1 tablet  1 tablet Oral Daily Eda Paschal [place], [place]   1 tablet at  05/24/11 1610  . nitroGLYCERIN (NITROSTAT) SL tablet 0.4 mg  0.4 mg Sublingual Q5 min PRN Lisette Paz, PA-C      . nitroGLYCERIN (NTG ON-CALL) 0.2 mg/mL injection           . nitroGLYCERIN 0.2 mg/mL in dextrose 5 % infusion  2-200 mcg/min Intravenous Continuous Marykay Lex, MD 3 mL/hr at 05/24/11 1408 10 mcg/min at 05/24/11 1408  . ondansetron (ZOFRAN) injection 4 mg  4 mg Intravenous Q6H PRN Marykay Lex, MD      . pantoprazole (PROTONIX) EC tablet 40 mg  40 mg Oral [address] [place], [place]   40 mg at 05/25/11 0558  . potassium chloride SA (K-DUR,KLOR-CON) CR tablet 40 mEq  40 mEq Oral Once Marykay Lex, MD   40 mEq at 05/24/11 0842  . potassium chloride SA (K-DUR,KLOR-CON) CR tablet 40 mEq  40 mEq Oral Once Marykay Lex, MD   40 mEq at 05/24/11 1107  . sodium chloride 0.9 % injection 3 mL  3 mL Intravenous Q12H Marykay Lex, MD   3 mL at 05/24/11 2120  .  sodium chloride 0.9 % injection 3 mL  3 mL Intravenous PRN Marykay Lex, MD      . zolpidem [facility]) tablet 10 mg  10 mg Oral QHS PRN Abelino Derrick, [place]      . DISCONTD: 0.9 %  sodium chloride infusion  1,000 mL Intravenous Continuous Lisette Paz, PA-C 125 mL/hr at 05/22/11 1942 1,000 mL at 05/22/11 1942  . DISCONTD: 0.9 %  sodium chloride infusion  250 mL Intravenous PRN Abelino Derrick, PA      . DISCONTD: 0.9 %  sodium chloride infusion  1 mL/kg/hr Intravenous Continuous Abelino Derrick, PA 74.8 mL/hr at 05/24/11 0818 1 mL/kg/hr at 05/24/11 0818  . DISCONTD: acetaminophen (TYLENOL) tablet 650 mg  650 mg Oral Q4H PRN Abelino Derrick, PA   650 mg at 05/24/11 1107  . DISCONTD: ALPRAZolam Prudy Feeler) tablet 0.25 mg  0.25 mg Oral TID PRN Abelino Derrick, PA      . DISCONTD: atorvastatin (LIPITOR) tablet 40 mg  40 mg Oral q1800 Eda Paschal [place], [place]      . DISCONTD: isosorbide mononitrate (IMDUR) 24 hr tablet 30 mg  30 mg Oral Daily Eda Paschal [place], [place]   30 mg at 05/24/11 9562  . DISCONTD: ondansetron (ZOFRAN) injection 4 mg  4 mg Intravenous Q6H PRN  Abelino Derrick, PA   4 mg at 05/24/11 1423  . DISCONTD: sodium chloride 0.9 % injection 3 mL  3 mL Intravenous PRN Abelino Derrick, PA        PE: General appearance: alert, cooperative and no distress Lungs: clear to auscultation bilaterally Heart: regular rate and rhythm, Split S2, No MM, R, G.   Extremities: No LEE Pulses: Radials and DPs2+ and symmetric Right wrist: Covered with pressure dressing.  No ecchymosis, erythema visible.  Lab Results:   Basename 05/25/11 0620 05/24/11 0543 05/23/11 1444  WBC 7.6 7.7 7.4  HGB 12.7 12.6 13.5  HCT 38.6 38.1 39.2  PLT 339 358 395   BMET  Basename 05/24/11 0543 05/23/11 1444 05/22/11 1934  NA 139 140 139  K 3.1* 3.3* 2.8*  CL 103 105 99  CO2 25 24 --  GLUCOSE 93 94 99  BUN 13 11 20   CREATININE 0.64 0.62 0.90  CALCIUM 8.7 9.1 --   PT/INR  Basename 05/23/11 1444  LABPROT 13.2  INR 0.98   Cholesterol  Basename 05/25/11 0620  CHOL 143    Studies/Results: Performing Cardiologist: ZHYQMVH,QIONG W  Primary Physician: No primary provider on file.  Primary Cardiologist: Lennette Bihari, MD  Procedures Performed:  Left Heart Catheterization via 5 Fr Right Radial Artery access  Left Ventriculography, (RAO) 12.5 ml/sec for 25 ml total contrast  Native Coronary Angiography Indication(s): Exertional Chest Pain; Abnormal Cardiac CTA - with ? LMCA stenosis  History: 61 y.o. female with HTN, and is a former smoker who presented to the ER with chest discomfort and low resting HR (she was recently started onTenoretic). Cardiac CTA performed in the ER revealed a concerning calcified distal Left Main lesion. She is therefore referred for definitive diagnosis with coronary angiography.  Consent: The procedure with Risks/Benefits/Alternatives and Indications was reviewed with the patient and family. All questions were answered.  Risks / Complications include, but not limited to: Death, MI, CVA/TIA, VF/VT (with defibrillation), Bradycardia (need  for temporary pacer placement), contrast induced nephropathy, bleeding / bruising / hematoma / pseudoaneurysm, vascular or coronary injury (with possible emergent CT or Vascular Surgery), adverse medication reactions, infection.  The patient (and family) voice understanding and agree to proceed.  Consent for signed by MD and patient with RN witness -- placed on chart.  Procedure: The patient was brought to the 2nd Floor [facility] in the fasting state and prepped and draped in the usual sterile fashion for Right groin or radial) access. Sterile technique was used including antiseptics, cap, gloves, gown, hand hygiene, mask, sheet and radial draping..  Skin prep: Chlorhexidine;  Time Out: Verified patient identification, verified procedure, site/side was marked, verified correct patient position, special equipment/implants available, medications/allergies/relevent history reviewed, required imaging and test results available. Performed  The right wrist was anesthetized with 1% subcutaneous Lidocaine. The right radial artery was accessed using the Seldinger Technique with placement of a 5 Fr Glide Sheath. The sheath was aspirated and flushed. Then a total of 10 ml of standard Radial Artery Cocktail (see medications) was infused. Radial Cocktail: 1.25 mg Nicardipine, 400 mcg NTG, 2 ml 2% Lidocaine  A 5 Fr TIG 4.0 Catheter was advanced of over a Versicore wire into the ascending Aorta and was used to engage the Left then Right and Coronary Artery. Multiple cineangiographic views of the both coronary artery system(s) were performed.  This catheter was then exchanged over the Long Exchange Safety J wire for an angled Pigtail catheter that was advanced across the Aortic Valve. LV hemodynamics were measured and Left Ventriculography was performed. LV hemodynamics were then re-sampled, and the catheter was pulled back across the Aortic Valve for measurement of "pull-back" gradient. The  catheter and wire were removed completely out of the body.  The sheath was removed in the Cath Lab with a TR band placed at 12 ml Air at 1337 hrs (time). Reverse Allen's test revealed non-occlusive hemostasis.  The patient was transported to the PACU in a hemodynamically stable & chest pain free condition.  The patient was stable before, during and following the procedure.  Patient did tolerate procedure well.  There were not complications.  EBL: < 10 ml  Medications:  Premedication: 5 mg Valium,  Sedation: 2 mg IV Versed, 25 IV mcg Fentanyl  Contrast: 70ml Omnipaque  Radial Cocktail: 1.25 mg Nicardipine, 400 mcg NTG, 2 ml 2% Lidocaine  IV Heparin: 3500 Units  Hemodynamics:  Central Aortic Pressure / Mean Aortic Pressure: 124/62 mmHg ; 88 mmHg  LV Pressure / LV End diastolic Pressure: 124/13MmHg ;  Left Ventriculography:  EF: 60-65%  Wall Motion: normal  Coronary Angiographic Data:  Left Main: Moderate to large caliber vessel that trifurcates into a small Ramus Intermedius as well as the LAD and Left Circumflex; the distal vessel tapers into a 60-70% lesion that is calcified, this lesion includes the ostium of the Left Circumflex.  Left Anterior Descending (LAD): Moderate caliber vessel; ~20-30% lesion at the Diag 1 (D1) branch point, otherwise minimal luminal irregularities.  1st diagonal (D1): Small to moderate caliber vessel, minimal luminal irregularities  2nd diagonal (D2): Small caliber vessel, minimal luminal irregularities  Circumflex (LCx): Moderate caliber vessel; ostial, calcified ~60% lesion  1st obtuse marginal: Moderate caliber vessel, minimal luminal irregularities  2nd obtuse marginal: Small caliber vessel, minimal luminal irregularities Distal LCx /3rd obtuse marginal: Moderate caliber vessel with multiple branches; minimal luminal irregularities  Small AV Groove Branch: Minimal luminal irregularities  Ramus Intermedius: Very small caliber vessel, minimal luminal  irregularities  Right Coronary Artery: Moderate caliber vessel; proximal 30% at first bend, 20% mid vessel then minimal luminal irregularities  right ventricle branch of right  coronary artery: Small to moderate caliber vessel, minimal luminal irregularities  posterior descending artery: Moderate caliber vessel, minimal luminal irregularities  posterior lateral branches: Moderate caliber vessel, minimal luminal irregularities Impression:  Severe calcified distal LMCA lesion consistent with CT Angiography with involvement of the Ostial Left Circumflex  Great distal targets with minimal to mild CAD in remaining coronary arteries Plan:  CT Surgical consultation for distal LMCA-LCx stenosis  Restart IV Heparin 6 hrs after TR Band off  Continue IV Nitroglycerin  Continue aggressive cardiac risk factor modification The case and results was discussed with the patient (and family).  The case and results was not discussed with the patient's PCP.  The case and results was discussed with the patient's Cardiologist.  Time Spend Directly with Patient:  35 minutes  Debera Sterba W, M.D., M.S.  Assessment/Plan   Principal Problem:  *Unstable angina Active Problems:  Abnormal screening cardiac CT, > 50% LMCA  Hypokalemia, 2.8 on admission  Anxiety  Depression  Renal cyst  History of smoking, quit 2008 Bradycardia  Plan:  S/P left heart cath.  Severe calcified distal LMCA lesion consistent with CT Angiography with involvement of the Ostial Left Circumflex.  CABG planned for tomorrow.  2D echo, carotid dopplers pending.  BP well controlled. HR 48-59. BMET pending.  Need to watch SCr and potassium.    LOS: 3 days    HAGER,BRYAN W 05/25/2011 7:45 AM  Pt seen & examined this AM with Mr. Leron Croak.  I agree with his findings, assessment & recommendations.  Stable post cath. Wrist looks good.   Was seen by Dr. Donata Clay (thanks) yesterday - plan CABG in AM. Carotid dopplers & echo pending (normal EF  by LVGram)  Feels fine, no more CP.  Having breathing Rx this AM. Emotionally more prepared for CABG.  On ASA, BB & IV Heparin / NTG   Montrez Marietta W, M.D., M.S. [facility] [address] [place], [place]  [address]  [phone] Pager # [phone]  05/25/2011 11:41 AM

## 2011-05-25 NOTE — Progress Notes (Signed)
ANTICOAGULATION CONSULT NOTE - Follow Up Consult  Pharmacy Consult for Heparin Indication: distal LM disease  Allergies  Allergen Reactions  . Levaquin Nausea And Vomiting  . Lisinopril Other (See Comments)    Excessive mucus production  . Latex Rash   Vital Signs: Temp: 98.2 F (36.8 C) (04/11 0802) Temp src: Oral (04/11 0802) BP: 110/58 mmHg (04/11 0808) Pulse Rate: 55  (04/11 0802)  Labs:  Basename 05/25/11 0620 05/24/11 0543 05/23/11 2327 05/23/11 1444 05/22/11 1934  HGB 12.7 12.6 -- -- --  HCT 38.6 38.1 -- 39.2 --  PLT 339 358 -- 395 --  APTT -- -- -- -- --  LABPROT -- -- -- 13.2 --  INR -- -- -- 0.98 --  HEPARINUNFRC 0.35 -- -- -- --  CREATININE -- 0.64 -- 0.62 0.90  CKTOTAL -- -- 58 65 --  CKMB -- -- 1.3 1.5 --  TROPONINI -- -- <0.30 <0.30 --   Estimated Creatinine Clearance: 74 ml/min (by C-G formula based on Cr of 0.64).  Medications:  Heparin @ 1000 units/hr  Assessment: 61yof continues on heparin s/p cath for severe distal LM disease awaiting CABG tomorrow.  Heparin level is therapeutic. No bleeding per chart notes.  Goal of Therapy:  Heparin level 0.3-0.7 units/ml   Plan:  1) Continue heparin at 1000 units/hr 2) Follow up after CABG 4/12  Fredrik Rigger 05/25/2011,8:41 AM

## 2011-05-26 ENCOUNTER — Encounter (HOSPITAL_COMMUNITY)
Admission: EM | Disposition: A | Discharge status: Skilled Nursing Facility | Payer: Self-pay | Source: Home / Self Care | Attending: Cardiothoracic Surgery

## 2011-05-26 ENCOUNTER — Inpatient Hospital Stay (HOSPITAL_COMMUNITY): Payer: Medicaid Other | Admitting: Anesthesiology

## 2011-05-26 ENCOUNTER — Encounter (HOSPITAL_COMMUNITY): Payer: Self-pay | Admitting: Anesthesiology

## 2011-05-26 ENCOUNTER — Inpatient Hospital Stay (HOSPITAL_COMMUNITY): Payer: Medicaid Other

## 2011-05-26 DIAGNOSIS — I2510 Atherosclerotic heart disease of native coronary artery without angina pectoris: Secondary | ICD-10-CM

## 2011-05-26 HISTORY — PX: CORONARY ARTERY BYPASS GRAFT: SHX141

## 2011-05-26 LAB — CBC
HCT: 26.9 % — ABNORMAL LOW (ref 36.0–46.0)
HCT: 30.8 % — ABNORMAL LOW (ref 36.0–46.0)
Hemoglobin: 9 g/dL — ABNORMAL LOW (ref 12.0–15.0)
Hemoglobin: 10.2 g/dL — ABNORMAL LOW (ref 12.0–15.0)
MCH: 28.2 pg (ref 26.0–34.0)
MCH: 27.6 pg (ref 26.0–34.0)
MCHC: 33.5 g/dL (ref 30.0–36.0)
MCHC: 33.1 g/dL (ref 30.0–36.0)
MCV: 84.3 fL (ref 78.0–100.0)
MCV: 83.5 fL (ref 78.0–100.0)
Platelets: 184 10*3/uL (ref 150–400)
Platelets: 243 10*3/uL (ref 150–400)
RBC: 3.19 MIL/uL — ABNORMAL LOW (ref 3.87–5.11)
RBC: 3.69 MIL/uL — ABNORMAL LOW (ref 3.87–5.11)
RDW: 13.5 % (ref 11.5–15.5)
RDW: 13.6 % (ref 11.5–15.5)
WBC: 7.1 10*3/uL (ref 4.0–10.5)
WBC: 10.6 10*3/uL — ABNORMAL HIGH (ref 4.0–10.5)

## 2011-05-26 LAB — CREATININE, SERUM
Creatinine, Ser: 0.56 mg/dL (ref 0.50–1.10)
GFR calc Af Amer: >90 mL/min (ref >90)
GFR calc non Af Amer: >90 mL/min (ref >90)

## 2011-05-26 LAB — POCT I-STAT 3, ART BLOOD GAS (G3+)
Acid-Base Excess: 1 mmol/L (ref 0.0–2.0)
Acid-Base Excess: 1 mmol/L (ref 0.0–2.0)
Acid-Base Excess: 1 mmol/L (ref 0.0–2.0)
Acid-base deficit: 1 mmol/L (ref 0.0–2.0)
Acid-base deficit: 1 mmol/L (ref 0.0–2.0)
Acid-base deficit: 2 mmol/L (ref 0.0–2.0)
Bicarbonate: 27.3 mEq/L — ABNORMAL HIGH (ref 20.0–24.0)
Bicarbonate: 23.9 mEq/L (ref 20.0–24.0)
Bicarbonate: 25.2 mEq/L — ABNORMAL HIGH (ref 20.0–24.0)
Bicarbonate: 23.9 mEq/L (ref 20.0–24.0)
Bicarbonate: 24.7 mEq/L — ABNORMAL HIGH (ref 20.0–24.0)
Bicarbonate: 24.5 mEq/L — ABNORMAL HIGH (ref 20.0–24.0)
O2 Saturation: 100 %
O2 Saturation: 100 %
O2 Saturation: 100 %
O2 Saturation: 95 %
O2 Saturation: 97 %
O2 Saturation: 95 %
Patient temperature: 35.5
Patient temperature: 37.1
Patient temperature: 37.1
TCO2: 29 mmol/L (ref 0–100)
TCO2: 25 mmol/L (ref 0–100)
TCO2: 26 mmol/L (ref 0–100)
TCO2: 25 mmol/L (ref 0–100)
TCO2: 26 mmol/L (ref 0–100)
TCO2: 26 mmol/L (ref 0–100)
pCO2 arterial: 48.1 mmHg — ABNORMAL HIGH (ref 35.0–45.0)
pCO2 arterial: 31.4 mmHg — ABNORMAL LOW (ref 35.0–45.0)
pCO2 arterial: 38.7 mmHg (ref 35.0–45.0)
pCO2 arterial: 36.7 mmHg (ref 35.0–45.0)
pCO2 arterial: 46.2 mmHg — ABNORMAL HIGH (ref 35.0–45.0)
pCO2 arterial: 47.1 mmHg — ABNORMAL HIGH (ref 35.0–45.0)
pH, Arterial: 7.363 (ref 7.350–7.400)
pH, Arterial: 7.489 — ABNORMAL HIGH (ref 7.350–7.400)
pH, Arterial: 7.422 — ABNORMAL HIGH (ref 7.350–7.400)
pH, Arterial: 7.415 — ABNORMAL HIGH (ref 7.350–7.400)
pH, Arterial: 7.336 — ABNORMAL LOW (ref 7.350–7.400)
pH, Arterial: 7.324 — ABNORMAL LOW (ref 7.350–7.400)
pO2, Arterial: 423 mmHg — ABNORMAL HIGH (ref 80.0–100.0)
pO2, Arterial: 354 mmHg — ABNORMAL HIGH (ref 80.0–100.0)
pO2, Arterial: 229 mmHg — ABNORMAL HIGH (ref 80.0–100.0)
pO2, Arterial: 67 mmHg — ABNORMAL LOW (ref 80.0–100.0)
pO2, Arterial: 98 mmHg (ref 80.0–100.0)
pO2, Arterial: 82 mmHg (ref 80.0–100.0)

## 2011-05-26 LAB — POCT I-STAT, CHEM 8
BUN: 5 mg/dL — ABNORMAL LOW (ref 6–23)
Calcium, Ion: 1.14 mmol/L (ref 1.12–1.32)
Chloride: 106 mEq/L (ref 96–112)
Creatinine, Ser: 0.7 mg/dL (ref 0.50–1.10)
Glucose, Bld: 157 mg/dL — ABNORMAL HIGH (ref 70–99)
HCT: 30 % — ABNORMAL LOW (ref 36.0–46.0)
Hemoglobin: 10.2 g/dL — ABNORMAL LOW (ref 12.0–15.0)
Potassium: 3.7 mEq/L (ref 3.5–5.1)
Sodium: 142 mEq/L (ref 135–145)
TCO2: 26 mmol/L (ref 0–100)

## 2011-05-26 LAB — POCT I-STAT 4, (NA,K, GLUC, HGB,HCT)
Glucose, Bld: 98 mg/dL (ref 70–99)
Glucose, Bld: 76 mg/dL (ref 70–99)
Glucose, Bld: 92 mg/dL (ref 70–99)
Glucose, Bld: 95 mg/dL (ref 70–99)
Glucose, Bld: 122 mg/dL — ABNORMAL HIGH (ref 70–99)
Glucose, Bld: 111 mg/dL — ABNORMAL HIGH (ref 70–99)
HCT: 34 % — ABNORMAL LOW (ref 36.0–46.0)
HCT: 21 % — ABNORMAL LOW (ref 36.0–46.0)
HCT: 31 % — ABNORMAL LOW (ref 36.0–46.0)
HCT: 22 % — ABNORMAL LOW (ref 36.0–46.0)
HCT: 25 % — ABNORMAL LOW (ref 36.0–46.0)
HCT: 26 % — ABNORMAL LOW (ref 36.0–46.0)
Hemoglobin: 11.6 g/dL — ABNORMAL LOW (ref 12.0–15.0)
Hemoglobin: 10.5 g/dL — ABNORMAL LOW (ref 12.0–15.0)
Hemoglobin: 7.1 g/dL — ABNORMAL LOW (ref 12.0–15.0)
Hemoglobin: 7.5 g/dL — ABNORMAL LOW (ref 12.0–15.0)
Hemoglobin: 8.5 g/dL — ABNORMAL LOW (ref 12.0–15.0)
Hemoglobin: 8.8 g/dL — ABNORMAL LOW (ref 12.0–15.0)
Potassium: 3.8 mEq/L (ref 3.5–5.1)
Potassium: 4.1 mEq/L (ref 3.5–5.1)
Potassium: 4.3 mEq/L (ref 3.5–5.1)
Potassium: 4.2 mEq/L (ref 3.5–5.1)
Potassium: 4.3 mEq/L (ref 3.5–5.1)
Potassium: 3.8 mEq/L (ref 3.5–5.1)
Sodium: 140 mEq/L (ref 135–145)
Sodium: 140 mEq/L (ref 135–145)
Sodium: 140 mEq/L (ref 135–145)
Sodium: 130 mEq/L — ABNORMAL LOW (ref 135–145)
Sodium: 136 mEq/L (ref 135–145)
Sodium: 139 mEq/L (ref 135–145)

## 2011-05-26 LAB — PROTIME-INR
INR: 1.24 (ref 0.00–1.49)
Prothrombin Time: 15.9 seconds — ABNORMAL HIGH (ref 11.6–15.2)

## 2011-05-26 LAB — HEMOGLOBIN AND HEMATOCRIT, BLOOD
HCT: 20.3 % — ABNORMAL LOW (ref 36.0–46.0)
Hemoglobin: 6.9 g/dL — CL (ref 12.0–15.0)

## 2011-05-26 LAB — MAGNESIUM: Magnesium: 2.4 mg/dL (ref 1.5–2.5)

## 2011-05-26 LAB — GLUCOSE, CAPILLARY
Glucose-Capillary: 86 mg/dL (ref 70–99)
Glucose-Capillary: 92 mg/dL (ref 70–99)
Glucose-Capillary: 146 mg/dL — ABNORMAL HIGH (ref 70–99)
Glucose-Capillary: 101 mg/dL — ABNORMAL HIGH (ref 70–99)

## 2011-05-26 LAB — PLATELET COUNT: Platelets: 194 10*3/uL (ref 150–400)

## 2011-05-26 LAB — APTT: aPTT: 34 seconds (ref 24–37)

## 2011-05-26 SURGERY — CORONARY ARTERY BYPASS GRAFTING (CABG)
Anesthesia: General | Site: Chest | Wound class: Clean

## 2011-05-26 MED ORDER — BISACODYL 10 MG RE SUPP: 10.0000 mg | Freq: Every day | RECTAL | Status: DC

## 2011-05-26 MED ORDER — ACETAMINOPHEN 650 MG RE SUPP
650.0000 mg | RECTAL | Status: AC
  Administered 2011-05-26: 650 mg via RECTAL

## 2011-05-26 MED ORDER — INSULIN ASPART 100 UNIT/ML ~~LOC~~ SOLN
0.0000 [IU] | SUBCUTANEOUS | Status: DC
  Administered 2011-05-27: 2 [IU] via SUBCUTANEOUS

## 2011-05-26 MED ORDER — INSULIN REGULAR BOLUS VIA INFUSION
0.0000 [IU] | Freq: Three times a day (TID) | INTRAVENOUS | Status: DC
  Filled 2011-05-26: qty 10

## 2011-05-26 MED ORDER — FENTANYL CITRATE 0.05 MG/ML IJ SOLN
INTRAMUSCULAR | Status: DC | PRN
  Administered 2011-05-26 (×2): 100 ug via INTRAVENOUS
  Administered 2011-05-26: 150 ug via INTRAVENOUS
  Administered 2011-05-26: 100 ug via INTRAVENOUS
  Administered 2011-05-26: 400 ug via INTRAVENOUS
  Administered 2011-05-26 (×2): 250 ug via INTRAVENOUS
  Administered 2011-05-26: 900 ug via INTRAVENOUS
  Administered 2011-05-26: 150 ug via INTRAVENOUS
  Administered 2011-05-26: 100 ug via INTRAVENOUS

## 2011-05-26 MED ORDER — KETOROLAC TROMETHAMINE 15 MG/ML IJ SOLN
15.0000 mg | Freq: Four times a day (QID) | INTRAMUSCULAR | Status: AC
  Administered 2011-05-26 – 2011-05-27 (×3): 15 mg via INTRAVENOUS
  Filled 2011-05-26 (×3): qty 1

## 2011-05-26 MED ORDER — PHENYLEPHRINE HCL 10 MG/ML IJ SOLN
0.0000 ug/min | INTRAMUSCULAR | Status: DC
  Filled 2011-05-26: qty 2

## 2011-05-26 MED ORDER — SODIUM CHLORIDE 0.9 % IJ SOLN
3.0000 mL | Freq: Two times a day (BID) | INTRAMUSCULAR | Status: DC
  Administered 2011-05-27 (×2): 3 mL via INTRAVENOUS

## 2011-05-26 MED ORDER — PROTAMINE SULFATE 10 MG/ML IV SOLN
INTRAVENOUS | Status: DC | PRN
  Administered 2011-05-26 (×2): 90 mg via INTRAVENOUS

## 2011-05-26 MED ORDER — SODIUM CHLORIDE 0.9 % IJ SOLN
OROMUCOSAL | Status: DC | PRN
  Administered 2011-05-26 (×3): via TOPICAL

## 2011-05-26 MED ORDER — SODIUM CHLORIDE 0.45 % IV SOLN
INTRAVENOUS | Status: DC
  Administered 2011-05-26: 20 mL/h via INTRAVENOUS

## 2011-05-26 MED ORDER — POTASSIUM CHLORIDE 10 MEQ/50ML IV SOLN
10.0000 meq | INTRAVENOUS | Status: AC
  Administered 2011-05-26 (×2): 10 meq via INTRAVENOUS

## 2011-05-26 MED ORDER — ACETAMINOPHEN 160 MG/5ML PO SOLN
975.0000 mg | Freq: Four times a day (QID) | ORAL | Status: DC
  Administered 2011-05-28: 975 mg
  Filled 2011-05-26: qty 40.6

## 2011-05-26 MED ORDER — HETASTARCH-ELECTROLYTES 6 % IV SOLN
INTRAVENOUS | Status: DC | PRN
  Administered 2011-05-26: 11:00:00 via INTRAVENOUS

## 2011-05-26 MED ORDER — HEMOSTATIC AGENTS (NO CHARGE) OPTIME
TOPICAL | Status: DC | PRN
  Administered 2011-05-26: 1 via TOPICAL

## 2011-05-26 MED ORDER — OXYCODONE HCL 5 MG PO TABS
5.0000 mg | ORAL_TABLET | ORAL | Status: DC | PRN
  Administered 2011-05-27 (×3): 5 mg via ORAL
  Administered 2011-05-29 (×2): 10 mg via ORAL
  Administered 2011-05-30 (×2): 5 mg via ORAL
  Administered 2011-05-30: 10 mg via ORAL
  Administered 2011-05-31: 5 mg via ORAL
  Administered 2011-05-31: 10 mg via ORAL
  Filled 2011-05-26: qty 1
  Filled 2011-05-26: qty 2
  Filled 2011-05-26 (×2): qty 1
  Filled 2011-05-26 (×4): qty 2
  Filled 2011-05-26 (×3): qty 1

## 2011-05-26 MED ORDER — FAMOTIDINE IN NACL 20-0.9 MG/50ML-% IV SOLN
20.0000 mg | Freq: Two times a day (BID) | INTRAVENOUS | Status: DC
  Administered 2011-05-26: 20 mg via INTRAVENOUS

## 2011-05-26 MED ORDER — BISACODYL 5 MG PO TBEC
10.0000 mg | DELAYED_RELEASE_TABLET | Freq: Every day | ORAL | Status: DC
  Administered 2011-05-27 – 2011-05-29 (×3): 10 mg via ORAL
  Administered 2011-05-30: 5 mg via ORAL
  Administered 2011-05-31: 10 mg via ORAL
  Filled 2011-05-26 (×5): qty 2

## 2011-05-26 MED ORDER — ASPIRIN EC 325 MG PO TBEC
325.0000 mg | DELAYED_RELEASE_TABLET | Freq: Every day | ORAL | Status: DC
  Administered 2011-05-27 – 2011-05-30 (×4): 325 mg via ORAL
  Filled 2011-05-26 (×5): qty 1

## 2011-05-26 MED ORDER — POTASSIUM CHLORIDE 10 MEQ/50ML IV SOLN
10.0000 meq | INTRAVENOUS | Status: AC | PRN
  Administered 2011-05-26 – 2011-05-27 (×3): 10 meq via INTRAVENOUS

## 2011-05-26 MED ORDER — INSULIN ASPART 100 UNIT/ML ~~LOC~~ SOLN
0.0000 [IU] | SUBCUTANEOUS | Status: AC
  Administered 2011-05-26: 2 [IU] via SUBCUTANEOUS

## 2011-05-26 MED ORDER — NITROGLYCERIN IN D5W 200-5 MCG/ML-% IV SOLN: 0.0000 ug/min | INTRAVENOUS | Status: DC

## 2011-05-26 MED ORDER — MIDAZOLAM HCL 5 MG/5ML IJ SOLN
INTRAMUSCULAR | Status: DC | PRN
  Administered 2011-05-26: 2 mg via INTRAVENOUS
  Administered 2011-05-26 (×2): 3 mg via INTRAVENOUS
  Administered 2011-05-26: 5 mg via INTRAVENOUS
  Administered 2011-05-26: 2 mg via INTRAVENOUS

## 2011-05-26 MED ORDER — MIDAZOLAM HCL 2 MG/2ML IJ SOLN: 2.0000 mg | INTRAMUSCULAR | Status: DC | PRN

## 2011-05-26 MED ORDER — VANCOMYCIN HCL 1000 MG IV SOLR
1000.0000 mg | Freq: Once | INTRAVENOUS | Status: AC
  Administered 2011-05-26: 1000 mg via INTRAVENOUS
  Filled 2011-05-26: qty 1000

## 2011-05-26 MED ORDER — VECURONIUM BROMIDE 10 MG IV SOLR
INTRAVENOUS | Status: DC | PRN
  Administered 2011-05-26 (×2): 5 mg via INTRAVENOUS

## 2011-05-26 MED ORDER — SODIUM CHLORIDE 0.9 % IJ SOLN: 3.0000 mL | INTRAMUSCULAR | Status: DC | PRN

## 2011-05-26 MED ORDER — METOPROLOL TARTRATE 25 MG/10 ML ORAL SUSPENSION
12.5000 mg | Freq: Two times a day (BID) | ORAL | Status: DC
  Administered 2011-05-27 – 2011-05-28 (×3): 12.5 mg
  Filled 2011-05-26 (×7): qty 5

## 2011-05-26 MED ORDER — LACTATED RINGERS IV SOLN: 500.0000 mL | Freq: Once | INTRAVENOUS | Status: AC | PRN

## 2011-05-26 MED ORDER — LACTATED RINGERS IV SOLN
INTRAVENOUS | Status: DC
  Administered 2011-05-26: 20 mL/h via INTRAVENOUS

## 2011-05-26 MED ORDER — SODIUM CHLORIDE 0.9 % IV SOLN
250.0000 mL | INTRAVENOUS | Status: DC
  Administered 2011-05-27: 250 mL via INTRAVENOUS

## 2011-05-26 MED ORDER — HEPARIN SODIUM (PORCINE) 1000 UNIT/ML IJ SOLN
INTRAMUSCULAR | Status: DC | PRN
  Administered 2011-05-26: 5000 [IU] via INTRAVENOUS
  Administered 2011-05-26: 21000 [IU] via INTRAVENOUS
  Administered 2011-05-26: 2000 [IU] via INTRAVENOUS

## 2011-05-26 MED ORDER — ALBUMIN HUMAN 5 % IV SOLN: 250.0000 mL | INTRAVENOUS | Status: AC | PRN

## 2011-05-26 MED ORDER — MORPHINE SULFATE 4 MG/ML IJ SOLN
2.0000 mg | INTRAMUSCULAR | Status: DC | PRN
  Administered 2011-05-27 – 2011-05-28 (×5): 4 mg via INTRAVENOUS
  Administered 2011-05-28: 2 mg via INTRAVENOUS
  Administered 2011-05-28: 4 mg via INTRAVENOUS
  Administered 2011-05-29: 2 mg via INTRAVENOUS
  Administered 2011-05-31: 4 mg via INTRAVENOUS
  Filled 2011-05-26 (×9): qty 1

## 2011-05-26 MED ORDER — ACETAMINOPHEN 500 MG PO TABS
1000.0000 mg | ORAL_TABLET | Freq: Four times a day (QID) | ORAL | Status: DC
  Administered 2011-05-27 – 2011-05-31 (×16): 1000 mg via ORAL
  Filled 2011-05-26 (×23): qty 2

## 2011-05-26 MED ORDER — MORPHINE SULFATE 2 MG/ML IJ SOLN
1.0000 mg | INTRAMUSCULAR | Status: AC | PRN
  Administered 2011-05-26 (×2): 1 mg via INTRAVENOUS
  Filled 2011-05-26: qty 1

## 2011-05-26 MED ORDER — METOPROLOL TARTRATE 12.5 MG HALF TABLET
12.5000 mg | ORAL_TABLET | Freq: Two times a day (BID) | ORAL | Status: DC
  Administered 2011-05-28 – 2011-05-30 (×4): 12.5 mg via ORAL
  Filled 2011-05-26 (×10): qty 1

## 2011-05-26 MED ORDER — SIMVASTATIN 20 MG PO TABS
20.0000 mg | ORAL_TABLET | Freq: Every day | ORAL | Status: DC
  Administered 2011-05-27 – 2011-05-30 (×4): 20 mg via ORAL
  Filled 2011-05-26 (×7): qty 1

## 2011-05-26 MED ORDER — SODIUM CHLORIDE 0.9 % IV SOLN: INTRAVENOUS | Status: DC

## 2011-05-26 MED ORDER — PANTOPRAZOLE SODIUM 40 MG PO TBEC
40.0000 mg | DELAYED_RELEASE_TABLET | Freq: Every day | ORAL | Status: DC
  Administered 2011-05-28 – 2011-05-31 (×4): 40 mg via ORAL
  Filled 2011-05-26 (×4): qty 1

## 2011-05-26 MED ORDER — METOPROLOL TARTRATE 1 MG/ML IV SOLN: 2.5000 mg | INTRAVENOUS | Status: DC | PRN

## 2011-05-26 MED ORDER — SODIUM CHLORIDE 0.9 % IV SOLN
INTRAVENOUS | Status: DC
  Filled 2011-05-26: qty 1

## 2011-05-26 MED ORDER — ACETAMINOPHEN 160 MG/5ML PO SOLN: 650.0000 mg | ORAL | Status: AC

## 2011-05-26 MED ORDER — DOCUSATE SODIUM 100 MG PO CAPS
200.0000 mg | ORAL_CAPSULE | Freq: Every day | ORAL | Status: DC
  Administered 2011-05-27 – 2011-05-31 (×5): 200 mg via ORAL
  Filled 2011-05-26 (×5): qty 2

## 2011-05-26 MED ORDER — MAGNESIUM SULFATE 40 MG/ML IJ SOLN
4.0000 g | Freq: Once | INTRAMUSCULAR | Status: AC
  Administered 2011-05-26: 4 g via INTRAVENOUS
  Filled 2011-05-26: qty 100

## 2011-05-26 MED ORDER — DEXTROSE 5 % IV SOLN
1.5000 g | Freq: Two times a day (BID) | INTRAVENOUS | Status: AC
  Administered 2011-05-26 – 2011-05-28 (×4): 1.5 g via INTRAVENOUS
  Filled 2011-05-26 (×4): qty 1.5

## 2011-05-26 MED ORDER — PROPOFOL 10 MG/ML IV EMUL
INTRAVENOUS | Status: DC | PRN
  Administered 2011-05-26: 30 mg via INTRAVENOUS

## 2011-05-26 MED ORDER — SODIUM CHLORIDE 0.9 % IV SOLN
0.1000 ug/kg/h | INTRAVENOUS | Status: DC
  Filled 2011-05-26 (×2): qty 2

## 2011-05-26 MED ORDER — ASPIRIN 81 MG PO CHEW
324.0000 mg | CHEWABLE_TABLET | Freq: Every day | ORAL | Status: DC
  Filled 2011-05-26: qty 1

## 2011-05-26 MED ORDER — ONDANSETRON HCL 4 MG/2ML IJ SOLN
4.0000 mg | Freq: Four times a day (QID) | INTRAMUSCULAR | Status: DC | PRN
  Administered 2011-05-26 – 2011-05-27 (×2): 4 mg via INTRAVENOUS
  Filled 2011-05-26 (×2): qty 2

## 2011-05-26 MED ORDER — ROCURONIUM BROMIDE 100 MG/10ML IV SOLN
INTRAVENOUS | Status: DC | PRN
  Administered 2011-05-26 (×2): 50 mg via INTRAVENOUS

## 2011-05-26 MED ORDER — LACTATED RINGERS IV SOLN
INTRAVENOUS | Status: DC | PRN
  Administered 2011-05-26 (×2): via INTRAVENOUS

## 2011-05-26 SURGICAL SUPPLY — 131 items
ADAPTER CARDIO PERF ANTE/RETRO (ADAPTER) ×3 IMPLANT
ADH SKN CLS APL DERMABOND .7 (GAUZE/BANDAGES/DRESSINGS) ×1
ADPR PRFSN 84XANTGRD RTRGD (ADAPTER) ×1
APPLIER CLIP 9.375 MED OPEN (MISCELLANEOUS)
APPLIER CLIP 9.375 SM OPEN (CLIP)
APR CLP MED 9.3 20 MLT OPN (MISCELLANEOUS)
APR CLP SM 9.3 20 MLT OPN (CLIP)
ATTRACTOMAT 16X20 MAGNETIC DRP (DRAPES) ×3 IMPLANT
BAG DECANTER FOR FLEXI CONT (MISCELLANEOUS) ×3 IMPLANT
BANDAGE ELASTIC 4 VELCRO ST LF (GAUZE/BANDAGES/DRESSINGS) ×3 IMPLANT
BANDAGE ELASTIC 6 VELCRO ST LF (GAUZE/BANDAGES/DRESSINGS) ×3 IMPLANT
BANDAGE GAUZE ELAST BULKY 4 IN (GAUZE/BANDAGES/DRESSINGS) ×3 IMPLANT
BASKET HEART  (ORDER IN 25'S) (MISCELLANEOUS) ×1
BASKET HEART (ORDER IN 25'S) (MISCELLANEOUS) ×1
BASKET HEART (ORDER IN 25S) (MISCELLANEOUS) ×1 IMPLANT
BLADE STERNUM SYSTEM 6 (BLADE) ×3 IMPLANT
BLADE SURG 12 STRL SS (BLADE) ×3 IMPLANT
BLADE SURG ROTATE 9660 (MISCELLANEOUS) IMPLANT
CANISTER SUCTION 2500CC (MISCELLANEOUS) ×3 IMPLANT
CANNULA GUNDRY RCSP 15FR (MISCELLANEOUS) ×3 IMPLANT
CANNULA VENOUS MAL SGL STG 40 (MISCELLANEOUS) IMPLANT
CANNULAE VENOUS MAL SGL STG 40 (MISCELLANEOUS)
CATH CPB KIT VANTRIGT (MISCELLANEOUS) ×3 IMPLANT
CATH ROBINSON RED A/P 18FR (CATHETERS) ×9 IMPLANT
CATH THORACIC 28FR (CATHETERS) IMPLANT
CATH THORACIC 28FR RT ANG (CATHETERS) IMPLANT
CATH THORACIC 36FR (CATHETERS) IMPLANT
CATH THORACIC 36FR RT ANG (CATHETERS) ×6 IMPLANT
CLIP APPLIE 9.375 MED OPEN (MISCELLANEOUS) IMPLANT
CLIP APPLIE 9.375 SM OPEN (CLIP) IMPLANT
CLIP FOGARTY SPRING 6M (CLIP) IMPLANT
CLIP TI MEDIUM 24 (CLIP) IMPLANT
CLIP TI WIDE RED SMALL 24 (CLIP) IMPLANT
CLOTH BEACON ORANGE TIMEOUT ST (SAFETY) ×3 IMPLANT
CONN Y 3/8X3/8X3/8  BEN (MISCELLANEOUS)
CONN Y 3/8X3/8X3/8 BEN (MISCELLANEOUS) IMPLANT
COVER SURGICAL LIGHT HANDLE (MISCELLANEOUS) ×6 IMPLANT
CRADLE DONUT ADULT HEAD (MISCELLANEOUS) ×3 IMPLANT
DERMABOND ADVANCED (GAUZE/BANDAGES/DRESSINGS) ×2
DERMABOND ADVANCED .7 DNX12 (GAUZE/BANDAGES/DRESSINGS) ×1 IMPLANT
DRAIN CHANNEL 32F RND 10.7 FF (WOUND CARE) ×3 IMPLANT
DRAPE CARDIOVASCULAR INCISE (DRAPES) ×3
DRAPE SLUSH MACHINE 52X66 (DRAPES) ×2 IMPLANT
DRAPE SLUSH/WARMER DISC (DRAPES) IMPLANT
DRAPE SRG 135X102X78XABS (DRAPES) ×1 IMPLANT
DRSG COVADERM 4X14 (GAUZE/BANDAGES/DRESSINGS) ×3 IMPLANT
ELECT BLADE 4.0 EZ CLEAN MEGAD (MISCELLANEOUS) ×3
ELECT BLADE 6.5 EXT (BLADE) ×3 IMPLANT
ELECT CAUTERY BLADE 6.4 (BLADE) ×3 IMPLANT
ELECT REM PT RETURN 9FT ADLT (ELECTROSURGICAL) ×6
ELECTRODE BLDE 4.0 EZ CLN MEGD (MISCELLANEOUS) ×1 IMPLANT
ELECTRODE REM PT RTRN 9FT ADLT (ELECTROSURGICAL) ×2 IMPLANT
GLOVE BIO SURGEON STRL SZ 6 (GLOVE) IMPLANT
GLOVE BIO SURGEON STRL SZ 6.5 (GLOVE) IMPLANT
GLOVE BIO SURGEON STRL SZ7 (GLOVE) IMPLANT
GLOVE BIO SURGEON STRL SZ7.5 (GLOVE) IMPLANT
GLOVE BIO SURGEONS STRL SZ 6.5 (GLOVE)
GLOVE BIOGEL PI IND STRL 6 (GLOVE) ×2 IMPLANT
GLOVE BIOGEL PI IND STRL 6.5 (GLOVE) IMPLANT
GLOVE BIOGEL PI IND STRL 7.0 (GLOVE) IMPLANT
GLOVE BIOGEL PI INDICATOR 6 (GLOVE) ×4
GLOVE BIOGEL PI INDICATOR 6.5 (GLOVE) ×8
GLOVE BIOGEL PI INDICATOR 7.0 (GLOVE) ×8
GLOVE EUDERMIC 7 POWDERFREE (GLOVE) IMPLANT
GLOVE ORTHO TXT STRL SZ7.5 (GLOVE) IMPLANT
GOWN STRL NON-REIN LRG LVL3 (GOWN DISPOSABLE) ×18 IMPLANT
HEMOSTAT POWDER SURGIFOAM 1G (HEMOSTASIS) ×9 IMPLANT
HEMOSTAT SURGICEL 2X14 (HEMOSTASIS) ×3 IMPLANT
INSERT FOGARTY 61MM (MISCELLANEOUS) IMPLANT
INSERT FOGARTY XLG (MISCELLANEOUS) IMPLANT
KIT BASIN OR (CUSTOM PROCEDURE TRAY) ×3 IMPLANT
KIT ROOM TURNOVER OR (KITS) ×3 IMPLANT
KIT SUCTION CATH 14FR (SUCTIONS) ×9 IMPLANT
KIT VASOVIEW W/TROCAR VH 2000 (KITS) ×3 IMPLANT
LEAD PACING MYOCARDI (MISCELLANEOUS) ×3 IMPLANT
MARKER GRAFT CORONARY BYPASS (MISCELLANEOUS) ×9 IMPLANT
NS IRRIG 1000ML POUR BTL (IV SOLUTION) ×15 IMPLANT
PACK OPEN HEART (CUSTOM PROCEDURE TRAY) ×3 IMPLANT
PAD ARMBOARD 7.5X6 YLW CONV (MISCELLANEOUS) ×6 IMPLANT
PENCIL BUTTON HOLSTER BLD 10FT (ELECTRODE) ×3 IMPLANT
PUNCH AORTIC ROTATE 4.0MM (MISCELLANEOUS) IMPLANT
PUNCH AORTIC ROTATE 4.5MM 8IN (MISCELLANEOUS) IMPLANT
PUNCH AORTIC ROTATE 5MM 8IN (MISCELLANEOUS) IMPLANT
SET CARDIOPLEGIA MPS 5001102 (MISCELLANEOUS) ×2 IMPLANT
SOLUTION ANTI FOG 6CC (MISCELLANEOUS) IMPLANT
SPONGE GAUZE 4X4 12PLY (GAUZE/BANDAGES/DRESSINGS) ×6 IMPLANT
SPONGE INTESTINAL PEANUT (DISPOSABLE) IMPLANT
SPONGE LAP 18X18 X RAY DECT (DISPOSABLE) IMPLANT
SPONGE LAP 4X18 X RAY DECT (DISPOSABLE) IMPLANT
SUT BONE WAX W31G (SUTURE) ×3 IMPLANT
SUT MNCRL AB 4-0 PS2 18 (SUTURE) ×2 IMPLANT
SUT PROLENE 3 0 SH DA (SUTURE) IMPLANT
SUT PROLENE 3 0 SH1 36 (SUTURE) IMPLANT
SUT PROLENE 4 0 RB 1 (SUTURE) ×3
SUT PROLENE 4 0 SH DA (SUTURE) ×3 IMPLANT
SUT PROLENE 4-0 RB1 .5 CRCL 36 (SUTURE) ×1 IMPLANT
SUT PROLENE 5 0 C 1 36 (SUTURE) IMPLANT
SUT PROLENE 6 0 C 1 30 (SUTURE) ×6 IMPLANT
SUT PROLENE 6 0 CC (SUTURE) IMPLANT
SUT PROLENE 7 0 BV 1 (SUTURE) IMPLANT
SUT PROLENE 7 0 BV1 MDA (SUTURE) IMPLANT
SUT PROLENE 7 0 DA (SUTURE) IMPLANT
SUT PROLENE 7.0 RB 3 (SUTURE) ×9 IMPLANT
SUT PROLENE 8 0 BV175 6 (SUTURE) IMPLANT
SUT PROLENE BLUE 7 0 (SUTURE) ×3 IMPLANT
SUT PROLENE POLY MONO (SUTURE) IMPLANT
SUT SILK  1 MH (SUTURE)
SUT SILK 1 MH (SUTURE) IMPLANT
SUT SILK 2 0 SH CR/8 (SUTURE) IMPLANT
SUT SILK 3 0 SH CR/8 (SUTURE) IMPLANT
SUT STEEL 6MS V (SUTURE) ×6 IMPLANT
SUT STEEL STERNAL CCS#1 18IN (SUTURE) IMPLANT
SUT STEEL SZ 6 DBL 3X14 BALL (SUTURE) ×3 IMPLANT
SUT VIC AB 1 CTX 18 (SUTURE) IMPLANT
SUT VIC AB 1 CTX 36 (SUTURE) ×6
SUT VIC AB 1 CTX36XBRD ANBCTR (SUTURE) ×2 IMPLANT
SUT VIC AB 2-0 CT1 27 (SUTURE) ×3
SUT VIC AB 2-0 CT1 TAPERPNT 27 (SUTURE) IMPLANT
SUT VIC AB 2-0 CTX 27 (SUTURE) IMPLANT
SUT VIC AB 3-0 SH 27 (SUTURE)
SUT VIC AB 3-0 SH 27X BRD (SUTURE) IMPLANT
SUT VIC AB 3-0 X1 27 (SUTURE) IMPLANT
SUT VICRYL 4-0 PS2 18IN ABS (SUTURE) IMPLANT
SUTURE E-PAK OPEN HEART (SUTURE) ×3 IMPLANT
SYSTEM SAHARA CHEST DRAIN ATS (WOUND CARE) ×3 IMPLANT
TOWEL OR 17X24 6PK STRL BLUE (TOWEL DISPOSABLE) ×6 IMPLANT
TOWEL OR 17X26 10 PK STRL BLUE (TOWEL DISPOSABLE) ×6 IMPLANT
TRAY FOLEY IC TEMP SENS 14FR (CATHETERS) ×3 IMPLANT
TUBING INSUFFLATION 10FT LAP (TUBING) ×3 IMPLANT
UNDERPAD 30X30 INCONTINENT (UNDERPADS AND DIAPERS) ×3 IMPLANT
WATER STERILE IRR 1000ML POUR (IV SOLUTION) ×6 IMPLANT

## 2011-05-26 NOTE — Progress Notes (Signed)
   CARE MANAGEMENT NOTE 05/26/2011  Patient:  [name] [name], [name] [name]   Account Number:  [id number]  Date Initiated:  05/26/2011  Documentation initiated by:  GRAVES-BIGELOW,Sherley Mckenney  Subjective/Objective Assessment:   Pt admitted with cp-s/p LHC. Plan for CABG 05-26-11.     Action/Plan:   CM will call FC for assistance with hospital bill.   Anticipated DC Date:  05/31/2011   Anticipated DC Plan:  HOME W HOME HEALTH SERVICES  In-house referral  Financial Counselor      DC Planning Services  CM consult      Choice offered to / List presented to:             Status of service:  In process, will continue to follow Medicare Important Message given?   (If response is "NO", the following Medicare IM given date fields will be blank) Date Medicare IM given:   Date Additional Medicare IM given:    Discharge Disposition:    Per UR Regulation:    If discussed at Long Length of Stay Meetings, dates discussed:    Comments:  05-26-11 1050 Tomi Bamberger, RN, BSN [phone] CM will continue to monitor after procedure for /c disposition needs.

## 2011-05-26 NOTE — Progress Notes (Signed)
[facility] Progress Note  Subjective:  Intubated; not yet awke; S/P CABG x2 with LIMA to LAD, SVG to LCX with EVR from R thigh.  Objective:   Vital Signs in the last 24 hours: Temp:  [95.9 F (35.5 C)-98.5 F (36.9 C)] 95.9 F (35.5 C) (04/12 1245) Pulse Rate:  [47-89] 89  (04/12 1245) Resp:  [12-18] 14  (04/12 1245) BP: (89-133)/(49-61) 91/60 mmHg (04/12 1245) SpO2:  [92 %-99 %] 94 % (04/12 1245) FiO2 (%):  [50 %] 50 % (04/12 1245) Weight:  [73.9 kg (162 lb 14.7 oz)] 73.9 kg (162 lb 14.7 oz) (04/12 1245)  Intake/Output from previous day: 04/11 0701 - 04/12 0700 In: 1329 [P.O.:900; I.V.:429] Out: 400 [Urine:400]  Scheduled:   . acetaminophen (TYLENOL) oral liquid 160 mg/5 mL  650 mg Per Tube NOW   Or  . acetaminophen  650 mg Rectal NOW  . acetaminophen  1,000 mg Oral Q6H   Or  . acetaminophen (TYLENOL) oral liquid 160 mg/5 mL  975 mg Per Tube Q6H  . aspirin EC  325 mg Oral Daily   Or  . aspirin  324 mg Per Tube Daily  . bisacodyl  10 mg Oral Daily   Or  . bisacodyl  10 mg Rectal Daily  . cefUROXime (ZINACEF)  IV  1.5 g Intravenous To OR  . cefUROXime (ZINACEF)  IV  1.5 g Intravenous Q12H  . chlorhexidine  60 mL Topical Once  . chlorhexidine  60 mL Topical Once  . chlorhexidine  60 mL Topical Once  . dexmedetomidine (PRECEDEX) IV infusion for high rates  0.1-0.7 mcg/kg/hr Intravenous To OR  . docusate sodium  200 mg Oral Daily  . famotidine (PEPCID) IV  20 mg Intravenous Q12H  . insulin (NOVOLIN-R) infusion   Intravenous To OR  . insulin regular  0-10 Units Intravenous TID WC  . magnesium sulfate  4 g Intravenous Once  . metoprolol tartrate  12.5 mg Oral BID   Or  . metoprolol tartrate  12.5 mg Per Tube BID  . mulitivitamin with minerals  1 tablet Oral Daily  . nitroglycerin-nicardipine-HEPARIN-sodium bicarbonate irrigation for artery spasm   Irrigation To OR  . pantoprazole  40 mg Oral Q1200  . potassium chloride  10 mEq  Intravenous Q1 Hr x 3  . simvastatin  20 mg Oral q1800  . sodium chloride  3 mL Intravenous Q12H  . tranexamic acid (CYKLOKAPRON) infusion ([facility])  1.5 mg/kg/hr Intravenous To OR  . vancomycin (VANCOCIN) IVPB 1000 mg/100 mL central line  1,000 mg Intravenous Once  . vancomycin  1,250 mg Intravenous To OR  . DISCONTD: bisacodyl  5 mg Oral Once  . DISCONTD: cefUROXime (ZINACEF)  IV  1.5 g Intravenous To OR  . DISCONTD: cefUROXime (ZINACEF)  IV  1.5 g Intravenous To OR  . DISCONTD: cefUROXime (ZINACEF)  IV  750 mg Intravenous To OR  . DISCONTD: DOPamine  2-20 mcg/kg/min Intravenous To OR  . DISCONTD: epinephrine  0.5-20 mcg/min Intravenous To OR  . DISCONTD: magnesium sulfate  40 mEq Other To OR  . DISCONTD: metoprolol tartrate  12.5 mg Oral BID  . DISCONTD: metoprolol tartrate  12.5 mg Oral Once  . DISCONTD: nitroGLYCERIN  2-200 mcg/min Intravenous To OR  . DISCONTD: pantoprazole  40 mg Oral Q0600  . DISCONTD: phenylephrine (NEO-SYNEPHRINE) Adult infusion  30-200 mcg/min Intravenous To OR  . DISCONTD: potassium chloride  80 mEq Other To OR  . DISCONTD: sodium chloride  3 mL Intravenous Q12H  . DISCONTD: tranexamic acid  15 mg/kg Intravenous To OR  . DISCONTD: tranexamic acid  2 mg/kg Intracatheter To OR  . DISCONTD: vancomycin  1,250 mg Intravenous To OR    Physical Exam:   Still under anesthesia on vent No JVD No wheezing RRR soft rub BS+ Mild edema    Rhythm: A paced at 90.  Underlying rhythm is SB at 43  Lab Results:    Basename 05/26/11 1145 05/26/11 1108 05/24/11 0543 05/23/11 1444  NA 136 130* -- --  K 4.3 4.2 -- --  CL -- -- 103 105  CO2 -- -- 25 24  GLUCOSE 122* 95 -- --  BUN -- -- 13 11  CREATININE -- -- 0.64 0.62    Basename 05/23/11 2327 05/23/11 1444  TROPONINI <0.30 <0.30   Hepatic Function Panel  Basename 05/23/11 1444  PROT 6.3  ALBUMIN 3.7  AST 13  ALT 10  ALKPHOS 74  BILITOT 0.3  BILIDIR --  IBILI --    Basename 05/23/11 1444  INR 0.98      Lipid Panel     Component Value Date/Time   CHOL 143 05/25/2011 0620   TRIG 130 05/25/2011 0620   HDL 49 05/25/2011 0620   CHOLHDL 2.9 05/25/2011 0620   VLDL 26 05/25/2011 0620   LDLCALC 68 05/25/2011 0620     Imaging:  Dg Chest 2 View  05/25/2011  *RADIOLOGY REPORT*  Clinical Data: Chest pain, shortness of breath.  CHEST - 2 VIEW  Comparison: 05/22/2011  Findings: Heart is mildly enlarged.  Peribronchial thickening.  No confluent opacities or effusions. No acute bony abnormality.  IMPRESSION: Bronchitic changes.  Mild cardiomegaly.  Original Report Authenticated By: Cyndie Chime, M.D.      Assessment/Plan:   Principal Problem:  *Abnormal screening cardiac CT, > 50% LMCA; Confirmed by Cardiac Cath 60-70% lesion Active Problems:  Unstable angina  Anxiety  Depression  Renal cyst  History of smoking, quit 2008  Just back from OR; S/P CABG x2.  Stable initial hemodynamics.    Lennette Bihari, MD, [facility] 05/26/2011, 1:06 PM

## 2011-05-26 NOTE — Anesthesia Procedure Notes (Signed)
Procedure Name: Intubation Date/Time: 05/26/2011 7:55 AM Performed by: Bronson Ing Pre-anesthesia Checklist: Emergency Drugs available, Patient identified, Timeout performed, Suction available and Patient being monitored Patient Re-evaluated:Patient Re-evaluated prior to inductionOxygen Delivery Method: Circle system utilized Preoxygenation: Pre-oxygenation with 100% oxygen Intubation Type: IV induction Ventilation: Mask ventilation without difficulty Laryngoscope Size: Mac and 3 Grade View: Grade II Tube type: Oral Number of attempts: 1 Placement Confirmation: breath sounds checked- equal and bilateral and positive ETCO2 Secured at: 21 cm Tube secured with: Tape Dental Injury: Teeth and Oropharynx as per pre-operative assessment

## 2011-05-26 NOTE — Procedures (Signed)
Extubation Procedure Note  Patient Details:   Name: [name] [name] DOB: 1950/10/04 MRN: [id number]   Airway Documentation:   Pt extubated to 2L [place] at 1655. No stridor noted. NIF -30, VC 700. Pos cuff leak. Pt able to vocalize.  Evaluation  O2 sats: stable throughout and currently acceptable Complications: No apparent complications Patient did tolerate procedure well. Bilateral Breath Sounds: Diminished     Christie Beckers 05/26/2011, 5:00 PM

## 2011-05-26 NOTE — Brief Op Note (Signed)
05/22/2011 - 05/26/2011  11:23 AM  PATIENT:  [name] [name]  61 y.o. female  PRE-OPERATIVE DIAGNOSIS:  Coronary artery disease  POST-OPERATIVE DIAGNOSIS:  Coronary Artery Disease  PROCEDURE: CORONARY ARTERY BYPASS GRAFTING (CABG) x 2 (LIMA to LAD, SVG to Circumflex) with EVH of the right thigh only  SURGEON:  Surgeon(s) and Role:    * Kerin Perna, MD - Primary  PHYSICIAN ASSISTANT: Doree Fudge PA-C   ANESTHESIA:   general  EBL:  Total I/O In: 2200 [I.V.:2200] Out: 800 [Urine:800]   DRAINS:  Chest Tube(s) in the Mediastinal and Pleural spaces    COUNTS CORRECT:  YES  DICTATION: .Dragon Dictation  PLAN OF CARE: Admit to inpatient   PATIENT DISPOSITION:  ICU - intubated and hemodynamically stable.   Delay start of Pharmacological VTE agent (>24hrs) due to surgical blood loss or risk of bleeding: yes  PRE OP WEIGHT: 74 kg

## 2011-05-26 NOTE — Transfer of Care (Signed)
Immediate Anesthesia Transfer of Care Note  Patient: [name] [name]  Procedure(s) Performed: Procedure(s) (LRB): CORONARY ARTERY BYPASS GRAFTING (CABG) (N/A)  Patient Location: SICU  Anesthesia Type: General  Level of Consciousness: sedated  Airway & Oxygen Therapy: Patient remains intubated per anesthesia plan  Post-op Assessment: Post -op Vital signs reviewed and stable  Post vital signs: Reviewed and stable  Complications: No apparent anesthesia complications

## 2011-05-26 NOTE — Anesthesia Preprocedure Evaluation (Addendum)
Anesthesia Evaluation  Patient identified by MRN, date of birth, ID band Patient awake    Reviewed: Allergy & Precautions, H&P , NPO status , Patient's Chart, lab work & pertinent test results, reviewed documented beta blocker date and time   History of Anesthesia Complications Negative for: history of anesthetic complications  Airway Mallampati: II TM Distance: >3 FB Neck ROM: full    Dental No notable dental hx. (+) Dental Advidsory Given and Teeth Intact   Pulmonary former smoker (quit '08) breath sounds clear to auscultation  Pulmonary exam normal       Cardiovascular hypertension, Pt. on home beta blockers and Pt. on medications + angina at rest + CAD (cath: 80% L main disease, normal LVF) Rhythm:Regular Rate:Bradycardia     Neuro/Psych PSYCHIATRIC DISORDERS negative neurological ROS     GI/Hepatic Neg liver ROS, GERD-  Medicated and Controlled,  Endo/Other  negative endocrine ROS  Renal/GU negative Renal ROS     Musculoskeletal   Abdominal (+) + obese,   Peds  Hematology   Anesthesia Other Findings   Reproductive/Obstetrics                          Anesthesia Physical Anesthesia Plan  ASA: III  Anesthesia Plan: General   Post-op Pain Management:    Induction: Intravenous  Airway Management Planned: Oral ETT  Additional Equipment: Arterial line, PA Cath, TEE and Ultrasound Guidance Line Placement  Intra-op Plan:   Post-operative Plan: Post-operative intubation/ventilation  Informed Consent: I have reviewed the patients History and Physical, chart, labs and discussed the procedure including the risks, benefits and alternatives for the proposed anesthesia with the patient or authorized representative who has indicated his/her understanding and acceptance.   Dental Advisory Given and Dental advisory given  Plan Discussed with: Anesthesiologist, CRNA and Surgeon  Anesthesia  Plan Comments: (Plan routine monitors, A Line, PA catheter, GETA with TEE and post op ventilation)       Anesthesia Quick Evaluation

## 2011-05-26 NOTE — Progress Notes (Signed)
  Echocardiogram Echocardiogram Transesophageal has been performed.  [name] [name] 05/26/2011, 8:38 AM

## 2011-05-26 NOTE — OR Nursing (Signed)
Chest Incision at 0915

## 2011-05-27 ENCOUNTER — Inpatient Hospital Stay (HOSPITAL_COMMUNITY): Payer: Medicaid Other

## 2011-05-27 LAB — POCT I-STAT, CHEM 8
BUN: 12 mg/dL (ref 6–23)
Calcium, Ion: 1.18 mmol/L (ref 1.12–1.32)
Chloride: 101 mEq/L (ref 96–112)
Creatinine, Ser: 1 mg/dL (ref 0.50–1.10)
Glucose, Bld: 128 mg/dL — ABNORMAL HIGH (ref 70–99)
HCT: 27 % — ABNORMAL LOW (ref 36.0–46.0)
Hemoglobin: 9.2 g/dL — ABNORMAL LOW (ref 12.0–15.0)
Potassium: 3.8 mEq/L (ref 3.5–5.1)
Sodium: 137 mEq/L (ref 135–145)
TCO2: 26 mmol/L (ref 0–100)

## 2011-05-27 LAB — BASIC METABOLIC PANEL
BUN: 9 mg/dL (ref 6–23)
CO2: 26 mEq/L (ref 19–32)
Calcium: 8 mg/dL — ABNORMAL LOW (ref 8.4–10.5)
Chloride: 105 mEq/L (ref 96–112)
Creatinine, Ser: 0.56 mg/dL (ref 0.50–1.10)
GFR calc Af Amer: >90 mL/min (ref >90)
GFR calc non Af Amer: >90 mL/min (ref >90)
Glucose, Bld: 122 mg/dL — ABNORMAL HIGH (ref 70–99)
Potassium: 4 mEq/L (ref 3.5–5.1)
Sodium: 138 mEq/L (ref 135–145)

## 2011-05-27 LAB — CREATININE, SERUM
Creatinine, Ser: 0.71 mg/dL (ref 0.50–1.10)
GFR calc Af Amer: >90 mL/min (ref >90)
GFR calc non Af Amer: >90 mL/min (ref >90)

## 2011-05-27 LAB — GLUCOSE, CAPILLARY
Glucose-Capillary: 104 mg/dL — ABNORMAL HIGH (ref 70–99)
Glucose-Capillary: 108 mg/dL — ABNORMAL HIGH (ref 70–99)
Glucose-Capillary: 117 mg/dL — ABNORMAL HIGH (ref 70–99)
Glucose-Capillary: 120 mg/dL — ABNORMAL HIGH (ref 70–99)
Glucose-Capillary: 132 mg/dL — ABNORMAL HIGH (ref 70–99)
Glucose-Capillary: 140 mg/dL — ABNORMAL HIGH (ref 70–99)
Glucose-Capillary: 160 mg/dL — ABNORMAL HIGH (ref 70–99)

## 2011-05-27 LAB — CBC
HCT: 30 % — ABNORMAL LOW (ref 36.0–46.0)
HCT: 28.8 % — ABNORMAL LOW (ref 36.0–46.0)
Hemoglobin: 9.9 g/dL — ABNORMAL LOW (ref 12.0–15.0)
Hemoglobin: 9.5 g/dL — ABNORMAL LOW (ref 12.0–15.0)
MCH: 28 pg (ref 26.0–34.0)
MCH: 28.2 pg (ref 26.0–34.0)
MCHC: 33 g/dL (ref 30.0–36.0)
MCHC: 33 g/dL (ref 30.0–36.0)
MCV: 84.7 fL (ref 78.0–100.0)
MCV: 85.5 fL (ref 78.0–100.0)
Platelets: 272 10*3/uL (ref 150–400)
Platelets: 234 10*3/uL (ref 150–400)
RBC: 3.54 MIL/uL — ABNORMAL LOW (ref 3.87–5.11)
RBC: 3.37 MIL/uL — ABNORMAL LOW (ref 3.87–5.11)
RDW: 13.9 % (ref 11.5–15.5)
RDW: 14.3 % (ref 11.5–15.5)
WBC: 11.5 10*3/uL — ABNORMAL HIGH (ref 4.0–10.5)
WBC: 11.4 10*3/uL — ABNORMAL HIGH (ref 4.0–10.5)

## 2011-05-27 LAB — MAGNESIUM
Magnesium: 2.5 mg/dL (ref 1.5–2.5)
Magnesium: 2.1 mg/dL (ref 1.5–2.5)

## 2011-05-27 MED ORDER — POTASSIUM CHLORIDE 10 MEQ/50ML IV SOLN
10.0000 meq | INTRAVENOUS | Status: AC
  Administered 2011-05-27 (×2): 10 meq via INTRAVENOUS
  Filled 2011-05-27: qty 50

## 2011-05-27 MED ORDER — TRAMADOL HCL 50 MG PO TABS
50.0000 mg | ORAL_TABLET | Freq: Four times a day (QID) | ORAL | Status: DC | PRN
  Administered 2011-05-28 – 2011-05-30 (×2): 50 mg via ORAL
  Filled 2011-05-27 (×2): qty 1

## 2011-05-27 MED ORDER — FUROSEMIDE 10 MG/ML IJ SOLN
20.0000 mg | Freq: Two times a day (BID) | INTRAMUSCULAR | Status: DC
  Administered 2011-05-27 – 2011-05-29 (×5): 20 mg via INTRAVENOUS
  Filled 2011-05-27 (×9): qty 2

## 2011-05-27 MED ORDER — INSULIN ASPART 100 UNIT/ML ~~LOC~~ SOLN
0.0000 [IU] | SUBCUTANEOUS | Status: DC
  Administered 2011-05-27 (×3): 2 [IU] via SUBCUTANEOUS

## 2011-05-27 NOTE — Op Note (Signed)
NAMERAECHELLE, SARTI NO.:  [id number]  MEDICAL RECORD NO.:  [id number]  LOCATION:  2315                         FACILITY:  [facility]  PHYSICIAN:  Kerin Perna, M.D.  DATE OF BIRTH:  Nov 14, 1950  DATE OF PROCEDURE:  05/26/2011 DATE OF DISCHARGE:                              OPERATIVE REPORT   OPERATIONS: 1. Coronary artery bypass grafting x2 (left internal mammary artery to     left anterior descending artery, saphenous vein graft to obtuse     marginal 2). 2. Endoscopic harvest of right leg greater saphenous vein.  PREOPERATIVE DIAGNOSIS:  Left main stenosis with unstable angina.  POSTOPERATIVE DIAGNOSIS:  Left main stenosis with unstable angina.  SURGEON:  Kerin Perna, MD  ASSISTANT:  Doree Fudge, PA-C  ANESTHESIA:  General.  INDICATIONS:  The patient is a 61 year old Caucasian female, ex-smoker, who presents with chest pain and a positive cardiac CTA with left main stenosis.  This was confirmed by coronary arteriography demonstrated a 70-80% left main stenosis extending into the circumflex.  LV systolic function was normal.  The 2D echo showed no significant valvular disease.  Surgical revascularization was recommended.  I reviewed the results of the cardiac cath and CT scan with the patient and family and discussed the indications and benefits of coronary artery bypass grafting for treatment of her severe left main coronary artery disease. I discussed the major aspects of surgery including the location of the surgical incision, the use of general anesthesia and cardiopulmonary bypass, and expected postoperative recovery.  I also reviewed the risks with her, bleeding, blood transfusion, MI, stroke, pneumonia, and death. She understood and agreed to proceed with surgery under what I felt was an informed consent.  OPERATIVE PROCEDURE:  The patient was brought to the operative room and placed supine on the operating room table where general  anesthesia was induced.  A transesophageal 2D echo probe was placed by the Anesthesia team, which confirmed preoperative diagnosis of normal LV function, no significant valvular disease.  The patient was prepped and draped as a sterile field.  A sternal incision was made as the saphenous vein was harvested endoscopically from the right leg.  The left internal mammary artery was harvested as a pedicle graft from its origin at the subclavian vessels.  The sternal retractor was placed, and the pericardium opened and suspended.  Pursestrings were placed in the ascending aorta and right atrium, and the patient was heparinized and then cannulated and placed on cardiopulmonary bypass.  The targets were identified in the mammary artery and vein were prepared for the distal anastomoses.  Cardioplegia cannulas were placed for both antegrade and retrograde cold blood cardioplegia.  The patient was cooled to 32 degrees and the aortic crossclamp was applied.  One liter of cold cardioplegia was delivered with good cardioplegic arrest and septal temperature dropped less than 12 degrees.  Cardioplegia was delivered every 20 minutes.  The distal coronary anastomoses were performed.  The first distal anastomosis was to the OM 2.  This was a 1.4-mm vessel with a proximal left main stenosis.  A reverse saphenous vein was sewn end-to-side with running 7-0 Prolene with good flow through the  graft.  The second distal anastomosis was to the mid-LAD.  It was a 1.5-1.6 mm vessel.  The left IMA pedicle was brought through an opening in the left lateral pericardium, was brought down onto the LAD and sewn end-to-side with running 8-0 Prolene.  There was good flow through the anastomosis after briefly releasing the pedicle bulldog on the mammary artery.  The bulldog was reapplied and the pedicle was secured to the epicardium. Cardioplegia was redosed.  While the crossclamp was still in place, one proximal vein  anastomosis was placed on the ascending aorta using a 4.5-mm punch and running 7-0 Prolene.  Air was vented from the coronary arteries with a dose of retrograde warm blood cardioplegia before removing the crossclamp.  After the crossclamp was removed, the heart resumed a spontaneous rhythm.  The vein graft was de-aired and opened and had good flow, and hemostasis was documented the proximal and distal anastomoses. Cardioplegia cannula was removed and temporary pacing wires were applied.  The patient was rewarmed to 37 degrees.  The ventilator was resumed.  The lung was expanded.  The patient was weaned off bypass without inotropes.  She was in sinus rhythm with stable hemodynamics. Protamine was administered without adverse reaction.  The cannula was removed.  The mediastinum was irrigated.  The mediastinum was closed superiorly.  An anterior mediastinal and left pleural chest tubes were placed and brought out through separate incisions.  The sternum was closed with interrupted steel wire.  The pectoralis fascia was closed with running #1 Vicryl.  The patient remained hemodynamically stable. The transesophageal echo post-pump showed normal LV function.  The subcutaneous and skin layers were closed with running Vicryl and sterile dressings were applied.  Total cardiopulmonary bypass time was 70 minutes.     Kerin Perna, M.D.     PV/MEDQ  D:  05/26/2011  T:  05/27/2011  Job:  [id number]  cc:   Landry Corporal, MD

## 2011-05-27 NOTE — Progress Notes (Signed)
SICU p.m. Rounds Status post CABG x2 for left main Normal sinus rhythm Walking the hallway P.m. labs stable potassium 3.8 we'll supplement

## 2011-05-27 NOTE — Progress Notes (Signed)
1 Day Post-Op Procedure(s) (LRB): CORONARY ARTERY BYPASS GRAFTING (CABG) (N/A)                      [address]            [place] [address]          [phone]      Subjective: S/P CABG for L main,unstable angina Stable hemodynamics off drips,NSR   Objective: Vital signs in last 24 hours: Temp:  [95.9 F (35.5 C)-99.5 F (37.5 C)] 99.1 F (37.3 C) (04/13 0700) Pulse Rate:  [75-90] 82  (04/13 0700) Cardiac Rhythm:  [-] Atrial paced (04/12 2300) Resp:  [12-26] 21  (04/13 0700) BP: (85-120)/(49-79) 90/54 mmHg (04/13 0700) SpO2:  [94 %-100 %] 95 % (04/13 0700) Arterial Line BP: (99-140)/(47-64) 106/49 mmHg (04/13 0700) FiO2 (%):  [50 %] 50 % (04/12 1245) Weight:  [162 lb 14.7 oz (73.9 kg)-172 lb 9.9 oz (78.3 kg)] 172 lb 9.9 oz (78.3 kg) (04/13 0600)  Hemodynamic parameters for last 24 hours: PAP: (19-37)/(6-20) 19/6 mmHg CO:  [4.4 L/min-5.7 L/min] 5.7 L/min CI:  [2.5 L/min/m2-3.2 L/min/m2] 3.2 L/min/m2  Intake/Output from previous day: 04/12 0701 - 04/13 0700 In: 5466.2 [I.V.:4566.2; Blood:350; IV Piggyback:550] Out: 4750 [Urine:3520; Blood:700; Chest Tube:530] Intake/Output this shift:   EXAM Lungs clear Neuro intact  extrem w/ good pulses   Lab Results:  Basename 05/27/11 0500 05/26/11 1857 05/26/11 1855  WBC 11.5* -- 10.6*  HGB 9.9* 10.2* --  HCT 30.0* 30.0* --  PLT 272 -- 243   BMET:  Basename 05/27/11 0500 05/26/11 1857  NA 138 142  K 4.0 3.7  CL 105 106  CO2 26 --  GLUCOSE 122* 157*  BUN 9 5*  CREATININE 0.56 0.70  CALCIUM 8.0* --    PT/INR:  Basename 05/26/11 1248  LABPROT 15.9*  INR 1.24   ABG    Component Value Date/Time   PHART 7.324* 05/26/2011 1800   HCO3 24.5* 05/26/2011 1800   TCO2 26 05/26/2011 1857   ACIDBASEDEF 2.0 05/26/2011 1800   O2SAT 95.0 05/26/2011 1800   CBG (last 3)   Basename 05/27/11 0715 05/27/11 0422 05/27/11 0018  GLUCAP 132* 117* 140*    Assessment/Plan: S/P Procedure(s) (LRB): CORONARY  ARTERY BYPASS GRAFTING (CABG) (N/A) PLAN- d/c lines diurese   LOS: 5 days    VAN TRIGT III,Richad Ramsay 05/27/2011

## 2011-05-27 NOTE — Progress Notes (Signed)
Subjective:  Extubated, awake and alert  Objective:  Vital Signs in the last 24 hours: Temp:  [95.9 F (35.5 C)-99.5 F (37.5 C)] 99.1 F (37.3 C) (04/13 0700) Pulse Rate:  [75-90] 82  (04/13 0700) Resp:  [12-26] 21  (04/13 0700) BP: (85-120)/(49-79) 90/54 mmHg (04/13 0700) SpO2:  [94 %-100 %] 95 % (04/13 0700) Arterial Line BP: (99-140)/(47-64) 106/49 mmHg (04/13 0700) FiO2 (%):  [50 %] 50 % (04/12 1245) Weight:  [73.9 kg (162 lb 14.7 oz)-78.3 kg (172 lb 9.9 oz)] 78.3 kg (172 lb 9.9 oz) (04/13 0600)  Intake/Output from previous day:  Intake/Output Summary (Last 24 hours) at 05/27/11 1001 Last data filed at 05/27/11 0600  Gross per 24 hour  Intake 4416.24 ml  Output   4150 ml  Net 266.24 ml    Physical Exam: General appearance: alert, cooperative and no distress Lungs: decreased breath sounds at bases Heart: regular rate and rhythm positive rub   Rate: 78  Rhythm: normal sinus rhythm  Lab Results:  Basename 05/27/11 0500 05/26/11 1857 05/26/11 1855  WBC 11.5* -- 10.6*  HGB 9.9* 10.2* --  PLT 272 -- 243    Basename 05/27/11 0500 05/26/11 1857  NA 138 142  K 4.0 3.7  CL 105 106  CO2 26 --  GLUCOSE 122* 157*  BUN 9 5*  CREATININE 0.56 0.70   No results found for this basename: TROPONINI:2,CK,MB:2 in the last 72 hours Hepatic Function Panel No results found for this basename: PROT,ALBUMIN,AST,ALT,ALKPHOS,BILITOT,BILIDIR,IBILI in the last 72 hours  Basename 05/25/11 0620  CHOL 143    Basename 05/26/11 1248  INR 1.24    Imaging: Imaging results have been reviewed  Cardiac Studies:  Assessment/Plan:   Principal Problem:  *Abnormal screening cardiac CT, > 50% LMCA; Confirmed by Cardiac Cath 60-70% lesion Active Problems:  Unstable angina  CAD (coronary artery disease) CABG X 2 05/26/11  Anxiety  Depression  Renal cyst  History of smoking, quit 2008   Plan- will follow with you.   [employer] PA-C 05/27/2011, 10:01 AM

## 2011-05-27 NOTE — Progress Notes (Signed)
Pt. Seen and examined. Agree with the NP/PA-C note as written.  Breyah Akhter C. Benen Weida, MD, FACC Attending Cardiologist [facility]   

## 2011-05-28 ENCOUNTER — Inpatient Hospital Stay (HOSPITAL_COMMUNITY): Payer: Medicaid Other

## 2011-05-28 LAB — GLUCOSE, CAPILLARY
Glucose-Capillary: 101 mg/dL — ABNORMAL HIGH (ref 70–99)
Glucose-Capillary: 102 mg/dL — ABNORMAL HIGH (ref 70–99)
Glucose-Capillary: 116 mg/dL — ABNORMAL HIGH (ref 70–99)
Glucose-Capillary: 96 mg/dL (ref 70–99)
Glucose-Capillary: 97 mg/dL (ref 70–99)

## 2011-05-28 LAB — CBC
HCT: 27.5 % — ABNORMAL LOW (ref 36.0–46.0)
Hemoglobin: 8.9 g/dL — ABNORMAL LOW (ref 12.0–15.0)
MCH: 28.1 pg (ref 26.0–34.0)
MCHC: 32.4 g/dL (ref 30.0–36.0)
MCV: 86.8 fL (ref 78.0–100.0)
Platelets: 213 10*3/uL (ref 150–400)
RBC: 3.17 MIL/uL — ABNORMAL LOW (ref 3.87–5.11)
RDW: 14.2 % (ref 11.5–15.5)
WBC: 10.4 10*3/uL (ref 4.0–10.5)

## 2011-05-28 LAB — BASIC METABOLIC PANEL
BUN: 13 mg/dL (ref 6–23)
CO2: 28 mEq/L (ref 19–32)
Calcium: 8.1 mg/dL — ABNORMAL LOW (ref 8.4–10.5)
Chloride: 100 mEq/L (ref 96–112)
Creatinine, Ser: 0.66 mg/dL (ref 0.50–1.10)
GFR calc Af Amer: >90 mL/min (ref >90)
GFR calc non Af Amer: >90 mL/min (ref >90)
Glucose, Bld: 111 mg/dL — ABNORMAL HIGH (ref 70–99)
Potassium: 4.1 mEq/L (ref 3.5–5.1)
Sodium: 133 mEq/L — ABNORMAL LOW (ref 135–145)

## 2011-05-28 MED ORDER — POTASSIUM CHLORIDE 10 MEQ/50ML IV SOLN: 10.0000 meq | INTRAVENOUS | Status: AC

## 2011-05-28 MED ORDER — SODIUM CHLORIDE 0.9 % IJ SOLN: 3.0000 mL | INTRAMUSCULAR | Status: DC | PRN

## 2011-05-28 MED ORDER — FUROSEMIDE 40 MG PO TABS
40.0000 mg | ORAL_TABLET | Freq: Every day | ORAL | Status: DC
Start: 2011-05-29 — End: 2011-05-31
  Administered 2011-05-30: 40 mg via ORAL
  Filled 2011-05-28 (×3): qty 1

## 2011-05-28 MED ORDER — MOVING RIGHT ALONG BOOK
Freq: Once | Status: AC
Start: 2011-05-28 — End: 2011-05-28
  Administered 2011-05-28: 1
  Filled 2011-05-28: qty 1

## 2011-05-28 MED ORDER — SODIUM CHLORIDE 0.9 % IJ SOLN
3.0000 mL | Freq: Two times a day (BID) | INTRAMUSCULAR | Status: DC
  Administered 2011-05-28 – 2011-05-31 (×5): 3 mL via INTRAVENOUS

## 2011-05-28 MED ORDER — MAGNESIUM HYDROXIDE 400 MG/5ML PO SUSP
30.0000 mL | Freq: Every day | ORAL | Status: DC | PRN
  Administered 2011-05-28: 30 mL via ORAL
  Filled 2011-05-28: qty 30

## 2011-05-28 MED ORDER — CYCLOBENZAPRINE HCL 10 MG PO TABS
5.0000 mg | ORAL_TABLET | Freq: Three times a day (TID) | ORAL | Status: DC | PRN
  Administered 2011-05-28: 22:00:00 via ORAL
  Administered 2011-05-29: 5 mg via ORAL
  Filled 2011-05-28 (×3): qty 1

## 2011-05-28 MED ORDER — POTASSIUM CHLORIDE CRYS ER 20 MEQ PO TBCR
20.0000 meq | EXTENDED_RELEASE_TABLET | Freq: Every day | ORAL | Status: DC
  Administered 2011-05-28 – 2011-05-31 (×4): 20 meq via ORAL
  Filled 2011-05-28 (×5): qty 1

## 2011-05-28 MED ORDER — SODIUM CHLORIDE 0.9 % IV SOLN: 250.0000 mL | INTRAVENOUS | Status: DC | PRN

## 2011-05-28 MED ORDER — KETOROLAC TROMETHAMINE 15 MG/ML IJ SOLN
15.0000 mg | Freq: Four times a day (QID) | INTRAMUSCULAR | Status: AC
  Administered 2011-05-28 (×3): 15 mg via INTRAVENOUS
  Filled 2011-05-28 (×3): qty 1

## 2011-05-28 MED ORDER — INSULIN ASPART 100 UNIT/ML ~~LOC~~ SOLN: 0.0000 [IU] | Freq: Three times a day (TID) | SUBCUTANEOUS | Status: DC

## 2011-05-28 NOTE — Progress Notes (Signed)
2 Days Post-Op Procedure(s) (LRB): CORONARY ARTERY BYPASS GRAFTING (CABG) (N/A)                  [address]            [place] [address]          [phone]     Subjective:CABG for L main,unstable angina doing well  Objective: Vital signs in last 24 hours: Temp:  [98.4 F (36.9 C)-99.3 F (37.4 C)] 98.7 F (37.1 C) (04/14 0800) Pulse Rate:  [68-85] 74  (04/14 0800) Cardiac Rhythm:  [-] Normal sinus rhythm (04/14 0800) Resp:  [11-24] 13  (04/14 0400) BP: (94-120)/(50-67) 113/61 mmHg (04/14 0800) SpO2:  [93 %-99 %] 95 % (04/14 0800) Weight:  [172 lb (78.019 kg)] 172 lb (78.019 kg) (04/14 0600)  Hemodynamic parameters for last 24 hours:  NSR  Intake/Output from previous day: 04/13 0701 - 04/14 0700 In: 1030 [P.O.:450; I.V.:480; IV Piggyback:100] Out: 1380 [Urine:1280; Chest Tube:100] Intake/Output this shift:    EXAM  Lungs clear,reg rhythm,extrem warm  Lab Results:  Basename 05/28/11 0401 05/27/11 1729 05/27/11 1700  WBC 10.4 -- 11.4*  HGB 8.9* 9.2* --  HCT 27.5* 27.0* --  PLT 213 -- 234   BMET:  Basename 05/28/11 0401 05/27/11 1729 05/27/11 0500  NA 133* 137 --  K 4.1 3.8 --  CL 100 101 --  CO2 28 -- 26  GLUCOSE 111* 128* --  BUN 13 12 --  CREATININE 0.66 1.00 --  CALCIUM 8.1* -- 8.0*    PT/INR:  Basename 05/26/11 1248  LABPROT 15.9*  INR 1.24   ABG    Component Value Date/Time   PHART 7.324* 05/26/2011 1800   HCO3 24.5* 05/26/2011 1800   TCO2 26 05/27/2011 1729   ACIDBASEDEF 2.0 05/26/2011 1800   O2SAT 95.0 05/26/2011 1800   CBG (last 3)   Basename 05/28/11 0740 05/28/11 0313 05/27/11 2301  GLUCAP 97 101* 108*    Assessment/Plan: S/P Procedure(s) (LRB): CORONARY ARTERY BYPASS GRAFTING (CABG) (N/A) Plan for transfer to step-down: see transfer orders   LOS: 6 days    VAN TRIGT III,Deundre Thong 05/28/2011

## 2011-05-28 NOTE — Progress Notes (Signed)
Subjective:  No CP or SOB; back spasm this AM  Objective:  Vital Signs in the last 24 hours: Temp:  [98.4 F (36.9 C)-99.3 F (37.4 C)] 98.7 F (37.1 C) (04/14 0741) Pulse Rate:  [68-85] 77  (04/14 0600) Resp:  [11-24] 13  (04/14 0400) BP: (94-120)/(50-67) 104/67 mmHg (04/14 0600) SpO2:  [93 %-99 %] 93 % (04/14 0600) Arterial Line BP: (111)/(50) 111/50 mmHg (04/13 0900) Weight:  [78.019 kg (172 lb)] 78.019 kg (172 lb) (04/14 0600)  Intake/Output from previous day:  Intake/Output Summary (Last 24 hours) at 05/28/11 0826 Last data filed at 05/28/11 0600  Gross per 24 hour  Intake    940 ml  Output   1280 ml  Net   -340 ml    Physical Exam: General appearance: alert, cooperative, no distress and very pleasan mood & affect Neck: no adenopathy, no carotid bruit, no JVD, supple, symmetrical, trachea midline, thyroid not enlarged, symmetric, no tenderness/mass/nodules and R IJ sheath in place - SWAN out; plan for sheath removal today Lungs: clear to auscultation bilaterally and normal percussion bilaterally Heart: friction rub heard soft -as expected post-op Abdomen: soft, non-tender; bowel sounds normal; no masses,  no organomegaly Extremities: extremities normal, atraumatic, no cyanosis or edema Pulses: 2+ and symmetric dressing c/d/i    Rate: 78  Rhythm: normal sinus rhythm  Lab Results:  Basename 05/28/11 0401 05/27/11 1729 05/27/11 1700  WBC 10.4 -- 11.4*  HGB 8.9* 9.2* --  PLT 213 -- 234    Basename 05/28/11 0401 05/27/11 1729 05/27/11 0500  NA 133* 137 --  K 4.1 3.8 --  CL 100 101 --  CO2 28 -- 26  GLUCOSE 111* 128* --  BUN 13 12 --  CREATININE 0.66 1.00 --     Imaging: Imaging results have been reviewed  Telemetry: NSR  Assessment/Plan:   Principal Problem:  *Abnormal screening cardiac CT, > 50% LMCA; Confirmed by Cardiac Cath 60-70% lesion Active Problems:   Unstable angina   CAD (coronary artery disease) CABG X 2 05/26/11   Anxiety    Depression   Renal cyst   History of smoking, quit 2008   Doing well post CABG - off pressors, no PACs or PVCs  Gradually titrate BB dose as BP stabilizes   Gentle diuresis per CT Sgx post-op  On ASA, statin -- no ACE-i or ARB for now given SBPs in low 100s  Back spasm -- planning to use Flexeril per CT Sgx.  Anticipate transfer to Telemetry soon. & likely d/c home soon.  She is planning to go to her daughter's house in [place] on d/c - may prefer to f/u @ our [facility] for short term (Dr. Rennis Golden or [facility]) as opposed to the [employer] office.  Marykay Lex, M.D., M.S. [facility] [address] [place], [place]  [address]  [phone] Pager # [phone]  05/28/2011 8:29 AM

## 2011-05-28 NOTE — Progress Notes (Signed)
Called for assistance, remove patients foley and removed patients right IJ sleeve, applied occlusive dressing and pressure held.  Patient tolerated both procedures well.Transported patient via wheelchair and monitor on with oxygen at 1 lpm to 2018.  Floor RN aware of patients arrival to floor.

## 2011-05-29 ENCOUNTER — Encounter (HOSPITAL_COMMUNITY): Payer: Self-pay | Admitting: Cardiothoracic Surgery

## 2011-05-29 ENCOUNTER — Inpatient Hospital Stay (HOSPITAL_COMMUNITY): Payer: Medicaid Other

## 2011-05-29 LAB — CBC
HCT: 26.8 % — ABNORMAL LOW (ref 36.0–46.0)
Hemoglobin: 8.9 g/dL — ABNORMAL LOW (ref 12.0–15.0)
MCH: 28.3 pg (ref 26.0–34.0)
MCHC: 33.2 g/dL (ref 30.0–36.0)
MCV: 85.4 fL (ref 78.0–100.0)
Platelets: 263 10*3/uL (ref 150–400)
RBC: 3.14 MIL/uL — ABNORMAL LOW (ref 3.87–5.11)
RDW: 14.3 % (ref 11.5–15.5)
WBC: 7.8 10*3/uL (ref 4.0–10.5)

## 2011-05-29 LAB — GLUCOSE, CAPILLARY
Glucose-Capillary: 61 mg/dL — ABNORMAL LOW (ref 70–99)
Glucose-Capillary: 138 mg/dL — ABNORMAL HIGH (ref 70–99)
Glucose-Capillary: 80 mg/dL (ref 70–99)
Glucose-Capillary: 131 mg/dL — ABNORMAL HIGH (ref 70–99)
Glucose-Capillary: 105 mg/dL — ABNORMAL HIGH (ref 70–99)

## 2011-05-29 LAB — BASIC METABOLIC PANEL
BUN: 18 mg/dL (ref 6–23)
CO2: 29 mEq/L (ref 19–32)
Calcium: 8.4 mg/dL (ref 8.4–10.5)
Chloride: 99 mEq/L (ref 96–112)
Creatinine, Ser: 0.72 mg/dL (ref 0.50–1.10)
GFR calc Af Amer: >90 mL/min (ref >90)
GFR calc non Af Amer: >90 mL/min (ref >90)
Glucose, Bld: 105 mg/dL — ABNORMAL HIGH (ref 70–99)
Potassium: 3.4 mEq/L — ABNORMAL LOW (ref 3.5–5.1)
Sodium: 133 mEq/L — ABNORMAL LOW (ref 135–145)

## 2011-05-29 MED ORDER — POTASSIUM CHLORIDE CRYS ER 20 MEQ PO TBCR
20.0000 meq | EXTENDED_RELEASE_TABLET | Freq: Once | ORAL | Status: AC
  Administered 2011-05-29: 20 meq via ORAL
  Filled 2011-05-29: qty 1

## 2011-05-29 MED ORDER — LACTULOSE 10 GM/15ML PO SOLN
20.0000 g | Freq: Once | ORAL | Status: AC
  Administered 2011-05-29: 20 g via ORAL
  Filled 2011-05-29: qty 30

## 2011-05-29 MED ORDER — FUROSEMIDE 10 MG/ML IJ SOLN
20.0000 mg | Freq: Once | INTRAMUSCULAR | Status: AC
  Administered 2011-05-29: 20 mg via INTRAVENOUS
  Filled 2011-05-29: qty 2

## 2011-05-29 MED ORDER — CYCLOBENZAPRINE HCL 5 MG PO TABS
7.5000 mg | ORAL_TABLET | Freq: Three times a day (TID) | ORAL | Status: DC | PRN
  Administered 2011-05-29 – 2011-05-30 (×2): 7.5 mg via ORAL
  Filled 2011-05-29 (×3): qty 1.5

## 2011-05-29 MED ORDER — GUAIFENESIN 100 MG/5ML PO SOLN
5.0000 mL | ORAL | Status: DC | PRN
  Administered 2011-05-30: 100 mg via ORAL
  Filled 2011-05-29: qty 5

## 2011-05-29 MED ORDER — POTASSIUM CHLORIDE CRYS ER 20 MEQ PO TBCR
40.0000 meq | EXTENDED_RELEASE_TABLET | Freq: Once | ORAL | Status: AC
  Administered 2011-05-29: 40 meq via ORAL
  Filled 2011-05-29: qty 2

## 2011-05-29 MED ORDER — POLYSACCHARIDE IRON COMPLEX 150 MG PO CAPS
150.0000 mg | ORAL_CAPSULE | Freq: Every day | ORAL | Status: DC
  Administered 2011-05-29 – 2011-05-31 (×3): 150 mg via ORAL
  Filled 2011-05-29 (×3): qty 1

## 2011-05-29 MED FILL — Magnesium Sulfate Inj 50%: INTRAMUSCULAR | Qty: 10 | Status: AC

## 2011-05-29 MED FILL — Potassium Chloride Inj 2 mEq/ML: INTRAVENOUS | Qty: 40 | Status: AC

## 2011-05-29 NOTE — Progress Notes (Signed)
Clinical Social Work Department CLINICAL SOCIAL WORK PLACEMENT NOTE 05/29/2011  Patient:  [name] [name], [name] [name]  Account Number:  [id number] Admit date:  05/22/2011  Clinical Social Worker:  Baxter Flattery, LCSWA  Date/time:  05/29/2011 12:00 N  Clinical Social Work is seeking post-discharge placement for this patient at the following level of care:   SKILLED NURSING   (*CSW will update this form in Epic as items are completed)   05/29/2011  Patient/family provided with [facility] Department of Clinical Social Work's list of facilities offering this level of care within the geographic area requested by the patient (or if unable, by the patient's family).  05/29/2011  Patient/family informed of their freedom to choose among providers that offer the needed level of care, that participate in Medicare, Medicaid or managed care program needed by the patient, have an available bed and are willing to accept the patient.  05/29/2011  Patient/family informed of [facility]' ownership interest in [facility], as well as of the fact that they are under no obligation to receive care at this facility.  PASARR submitted to EDS on 05/29/2011 PASARR number received from EDS on   FL2 transmitted to all facilities in geographic area requested by pt/family on  05/29/2011 FL2 transmitted to all facilities within larger geographic area on   Patient informed that his/her managed care company has contracts with or will negotiate with  certain facilities, including the following:     Patient/family informed of bed offers received:   Patient chooses bed at  Physician recommends and patient chooses bed at    Patient to be transferred to  on   Patient to be transferred to facility by   The following physician request were entered in Epic:   Additional Comments: Preference for [facility] facility

## 2011-05-29 NOTE — Progress Notes (Addendum)
3 Days Post-Op Procedure(s) (LRB): CORONARY ARTERY BYPASS GRAFTING (CABG) (N/A)  Subjective: Patient with complaints of constipation, left sided back spasms and pain.  Objective: Vital signs in last 24 hours: Patient Vitals for the past 24 hrs:  BP Temp Temp src Pulse Resp SpO2 Weight  05/29/11 0951 118/57 mmHg - - 81  - - -  05/29/11 0542 102/65 mmHg 99.2 F (37.3 C) Oral 81  20  91 % 172 lb 6.4 oz (78.2 kg)  05/28/11 1945 102/68 mmHg 98.6 F (37 C) Oral 83  21  94 % -  05/28/11 1615 - - - - 22  - -  05/28/11 1612 108/64 mmHg 98.6 F (37 C) Oral 85  - 94 % -  05/28/11 1500 113/54 mmHg - - 81  15  95 % -  05/28/11 1400 - - - 93  23  95 % -  05/28/11 1300 93/52 mmHg - - 84  15  98 % -  05/28/11 1200 104/49 mmHg 99.3 F (37.4 C) Oral 80  14  96 % -  05/28/11 1147 - 99.3 F (37.4 C) Oral - - - -  05/28/11 1100 121/55 mmHg - - 82  20  97 % -   Pre op weight  74 kg Current Weight  05/29/11 172 lb 6.4 oz (78.2 kg)      Intake/Output from previous day: 04/14 0701 - 04/15 0700 In: 1640 [P.O.:1560; I.V.:80] Out: 1710 [Urine:1710]   Physical Exam:  Cardiovascular: RRR, no murmurs, gallops, or rubs. Pulmonary: Slightly diminished at bases; no rales, wheezes, or rhonchi. Abdomen: Soft, non tender, bowel sounds present. Extremities: Mild bilateral lower extremity edema. Wounds: Clean and dry.  No erythema or signs of infection.  Lab Results: CBC: Basename 05/29/11 0630 05/28/11 0401  WBC 7.8 10.4  HGB 8.9* 8.9*  HCT 26.8* 27.5*  PLT 263 213   BMET:  Basename 05/29/11 0630 05/28/11 0401  NA 133* 133*  K 3.4* 4.1  CL 99 100  CO2 29 28  GLUCOSE 105* 111*  BUN 18 13  CREATININE 0.72 0.66  CALCIUM 8.4 8.1*    PT/INR:  Basename 05/26/11 1248  LABPROT 15.9*  INR 1.24   ABG:  INR: Will add last result for INR, ABG once components are confirmed Will add last 4 CBG results once components are confirmed  Assessment/Plan:  1. CV - SR.Continue Lopressor 12. 5  bid. 2.  Pulmonary - Encourage incentive spirometer.CXR this am shows cardiomegaly, mild pulmonary vascular congestion, LLL atelectasis, no ptx, and small bilateral pleural effusions. 3. Volume Overload - Continue with diuresis. 4.  Acute blood loss anemia - H/H this am 8.9/26.8. Start Nu Iron. 5.Supplement Potassium. 6.Pre op HGA1C 5.8.CBGs have been 138 or less. Will stop glucose checks. 7.Remove EPW in am 8.LOC constipation. 9.Flexeril,Percocet, and warm compress for back spasms. 10.Possible discharge on Wednesday.   ZIMMERMAN,DONIELLE MPA-C 05/29/2011   patient examined and medical record reviewed,agree with above note. Planning in process for SNF VAN TRIGT III,Auburn Hester 05/29/2011

## 2011-05-29 NOTE — Progress Notes (Signed)
[facility]  Subjective: Back spasm is her main issue.  It has improved some with fexeril and oxycodone.  Objective: Vital signs in last 24 hours: Temp:  [98.6 F (37 C)-99.3 F (37.4 C)] 99.2 F (37.3 C) (04/15 0542) Pulse Rate:  [80-93] 81  (04/15 0542) Resp:  [13-23] 20  (04/15 0542) BP: (81-121)/(49-68) 102/65 mmHg (04/15 0542) SpO2:  [91 %-98 %] 91 % (04/15 0542) Weight:  [78.2 kg (172 lb 6.4 oz)] 78.2 kg (172 lb 6.4 oz) (04/15 0542) Last BM Date:  (preop)  Intake/Output from previous day: 04/14 0701 - 04/15 0700 In: 1640 [P.O.:1560; I.V.:80] Out: 1710 [Urine:1710] Intake/Output this shift:    Medications Current Facility-Administered Medications  Medication Dose Route Frequency Provider Last Rate Last Dose  . 0.9 %  sodium chloride infusion  250 mL Intravenous PRN Kerin Perna, MD      . acetaminophen (TYLENOL) tablet 1,000 mg  1,000 mg Oral Q6H Ardelle Balls, PA   1,000 mg at 05/29/11 0555   Or  . acetaminophen (TYLENOL) solution 975 mg  975 mg Per Tube Q6H Ardelle Balls, PA   975 mg at 05/28/11 1748  . aspirin EC tablet 325 mg  325 mg Oral Daily Ardelle Balls, PA   325 mg at 05/28/11 1051   Or  . aspirin chewable tablet 324 mg  324 mg Per Tube Daily Donielle Margaretann Loveless, PA      . bisacodyl (DULCOLAX) EC tablet 10 mg  10 mg Oral Daily Ardelle Balls, PA   10 mg at 05/28/11 1051   Or  . bisacodyl (DULCOLAX) suppository 10 mg  10 mg Rectal Daily Donielle Margaretann Loveless, PA      . cefUROXime (ZINACEF) 1.5 g in dextrose 5 % 50 mL IVPB  1.5 g Intravenous Q12H Ardelle Balls, PA   1.5 g at 05/28/11 1000  . cyclobenzaprine (FLEXERIL) tablet 5 mg  5 mg Oral TID PRN Kerin Perna, MD   5 mg at 05/29/11 0554  . docusate sodium (COLACE) capsule 200 mg  200 mg Oral Daily Ardelle Balls, PA   200 mg at 05/28/11 1053  . furosemide (LASIX) injection 20 mg  20 mg Intravenous BID Kerin Perna, MD   20 mg at  05/28/11 1853  . furosemide (LASIX) tablet 40 mg  40 mg Oral Daily Kerin Perna, MD      . insulin aspart (novoLOG) injection 0-24 Units  0-24 Units Subcutaneous TID AC & HS Kerin Perna, MD      . ketorolac (TORADOL) 15 MG/ML injection 15 mg  15 mg Intravenous Q6H Kerin Perna, MD   15 mg at 05/28/11 2339  . magnesium hydroxide (MILK OF MAGNESIA) suspension 30 mL  30 mL Oral Daily PRN Kerin Perna, MD   30 mL at 05/28/11 2128  . metoprolol (LOPRESSOR) injection 2.5-5 mg  2.5-5 mg Intravenous Q2H PRN Ardelle Balls, PA      . metoprolol tartrate (LOPRESSOR) tablet 12.5 mg  12.5 mg Oral BID Ardelle Balls, PA   12.5 mg at 05/28/11 2128   Or  . metoprolol tartrate (LOPRESSOR) 25 mg/10 mL oral suspension 12.5 mg  12.5 mg Per Tube BID Ardelle Balls, PA   12.5 mg at 05/28/11 1000  . morphine 4 MG/ML injection 2-5 mg  2-5 mg Intravenous Q1H PRN Ardelle Balls, PA   2 mg at 05/29/11 0550  .  moving right along book   Does not apply Once Kerin Perna, MD   1 each at 05/28/11 1134  . mulitivitamin with minerals tablet 1 tablet  1 tablet Oral Daily Eda Paschal [place], [place]   1 tablet at 05/28/11 1000  . ondansetron (ZOFRAN) injection 4 mg  4 mg Intravenous Q6H PRN Ardelle Balls, PA   4 mg at 05/27/11 0448  . oxyCODONE (Oxy IR/ROXICODONE) immediate release tablet 5-10 mg  5-10 mg Oral Q3H PRN Ardelle Balls, PA   10 mg at 05/29/11 0839  . pantoprazole (PROTONIX) EC tablet 40 mg  40 mg Oral Q1200 Ardelle Balls, PA   40 mg at 05/28/11 1200  . potassium chloride 10 mEq in 50 mL *CENTRAL LINE* IVPB  10 mEq Intravenous Q1 Hr x 2 Kerin Perna, MD      . potassium chloride SA (K-DUR,KLOR-CON) CR tablet 20 mEq  20 mEq Oral Daily Kerin Perna, MD   20 mEq at 05/28/11 1200  . simvastatin (ZOCOR) tablet 20 mg  20 mg Oral q1800 Ardelle Balls, PA   20 mg at 05/28/11 1853  . sodium chloride 0.9 % injection 3 mL  3 mL Intravenous Q12H Kerin Perna, MD   3  mL at 05/28/11 1035  . sodium chloride 0.9 % injection 3 mL  3 mL Intravenous PRN Kerin Perna, MD      . traMADol Janean Sark) tablet 50 mg  50 mg Oral Q6H PRN Kerin Perna, MD   50 mg at 05/28/11 1617  . DISCONTD: 0.45 % sodium chloride infusion   Intravenous Continuous Ardelle Balls, PA 20 mL/hr at 05/28/11 0600    . DISCONTD: insulin aspart (novoLOG) injection 0-24 Units  0-24 Units Subcutaneous Q4H Kerin Perna, MD   2 Units at 05/27/11 2015  . DISCONTD: midazolam (VERSED) injection 2 mg  2 mg Intravenous Q1H PRN Ardelle Balls, PA      . DISCONTD: sodium chloride 0.9 % injection 3 mL  3 mL Intravenous Q12H Ardelle Balls, PA   3 mL at 05/27/11 2105  . DISCONTD: sodium chloride 0.9 % injection 3 mL  3 mL Intravenous PRN Ardelle Balls, PA        PE: General appearance: alert, cooperative and looks uncomfortable. Lungs: clear to auscultation bilaterally Heart: Regular rate and rhythm, 1/6 sys MM. Extremities: 1+ right LEE. Pulses: 2+ and symmetric  Lab Results:   Basename 05/29/11 0630 05/28/11 0401 05/27/11 1729 05/27/11 1700  WBC 7.8 10.4 -- 11.4*  HGB 8.9* 8.9* 9.2* --  HCT 26.8* 27.5* 27.0* --  PLT 263 213 -- 234   BMET  Basename 05/29/11 0630 05/28/11 0401 05/27/11 1729 05/27/11 0500  NA 133* 133* 137 --  K 3.4* 4.1 3.8 --  CL 99 100 101 --  CO2 29 28 -- 26  GLUCOSE 105* 111* 128* --  BUN 18 13 12  --  CREATININE 0.72 0.66 1.00 --  CALCIUM 8.4 8.1* -- 8.0*   PT/INR  Basename 05/26/11 1248  LABPROT 15.9*  INR 1.24   CHEST - 2 VIEW  Comparison: 05/28/2011  Findings:  Enlargement of cardiac silhouette post CABG.  Interval removal of right jugular line.  Minimal pulmonary vascular cephalization.  Persistent left lower lobe atelectasis versus consolidation.  Small bibasilar effusions greater on left.  No pneumothorax.  No acute osseous findings.  IMPRESSION:  Persistent atelectasis versus consolidation of left lower lobe.  Small  bibasilar  effusions, greater on the left.  Assessment/Plan  Principal Problem:  *Abnormal screening cardiac CT, > 50% LMCA; Confirmed by Cardiac Cath 60-70% lesion Active Problems:  Unstable angina  Anxiety  Depression  Renal cyst  History of smoking, quit 2008  CAD (coronary artery disease) CABG X 2 05/26/11 Hypokalemia  Plan:  POD #2  CABG x2 LIME to LAD,  SVG to OM 2.  Ambulated well this AM 350ft with walker.  ASA/ lopressor 12.5bid/lasix/statin.  BP soft: 81/61 - 121/55.  HGB low but stable.  Back spam/pain her her primary concern today.  Some relief with pain.   Potassium repleted.      LOS: 7 days    HAGER,BRYAN W 05/29/2011 9:39 AM   Patient seen and examined. Agree with assessment and plan. No chest pain or SOB; day 3 s/p CABG.  Normal sinus rhythm.  1/6 SEM, no wheezing or rales on exam. Cardiac Rehab.   Lennette Bihari, MD, [facility] 05/29/2011 4:48 PM

## 2011-05-29 NOTE — Progress Notes (Signed)
CARDIAC REHAB PHASE I   PRE:  Rate/Rhythm: 86SR  BP:  Supine: 101/46  Sitting:   Standing:    SaO2: 92%RA  MODE:  Ambulation: 350 ft   POST:  Rate/Rhythem: 95SR  BP:  Supine: 114/49  Sitting:   Standing:    SaO2: 93%RA 0855-0927 Pt walked 350 ft on RA with rolling walker and asst x 1. Having back spasms but happy to walk. Back to bed after walk.tolerated well.  [name] [name]

## 2011-05-29 NOTE — Progress Notes (Signed)
CSW requesting SNF placement at d/c. CSW discussed placement process, answered questions. Pt requesting Medicaid application, and CSW has referred this to financial counseling who will bring packet and can deliver completed application to DSS. Pt will need to complete this application in order for CSW to secure SNF placement with Letter of Guarantee for Medicaid pending status.  CSW will initiate SNF search at this time and will follow to facilitate d/c to SNF when pt is medically ready.  Baxter Flattery, MSW [phone]

## 2011-05-29 NOTE — Progress Notes (Signed)
   CARE MANAGEMENT NOTE 05/29/2011  Patient:  [name] [name], [name] [name]   Account Number:  [id number]  Date Initiated:  05/26/2011  Documentation initiated by:  GRAVES-BIGELOW,BRENDA  Subjective/Objective Assessment:   Pt admitted with cp-s/p LHC. Plan for CABG 05-26-11.    Lives alone     Action/Plan:   CM will call FC for assistance with hospital bill.   Anticipated DC Date:  06/02/2011   Anticipated DC Plan:  HOME W HOME HEALTH SERVICES  In-house referral  Financial Counselor      DC Planning Services  CM consult      Choice offered to / List presented to:             Status of service:  In process, will continue to follow Medicare Important Message given?   (If response is "NO", the following Medicare IM given date fields will be blank) Date Medicare IM given:   Date Additional Medicare IM given:    Discharge Disposition:    Per UR Regulation:  Reviewed for med. necessity/level of care/duration of stay  If discussed at Long Length of Stay Meetings, dates discussed:    Comments:  05/29/11 Alcario Tinkey,RN,BSN 1100 PT S/P CABG ON 4/12; PTA, PT INDEPENDENT, LIVES ALONE.  SHE STATES SHE WILL NEED SHORT TERM SNF PLACEMENT AT DISCHARGE. WILL OBTAIN P.T. CONSULT;  CSW CONSULTED TO FACILITATE DC TO SNF WHEN MEDICALLY STABLE FOR DC. Jerrell Belfast, RN, BSN Phone #[phone]  05-26-11 [address], RN, BSN [phone] CM will continue to monitor after procedure for /c disposition needs.

## 2011-05-29 NOTE — Anesthesia Postprocedure Evaluation (Signed)
  Anesthesia Post-op Note  Patient: [name] [name]  Procedure(s) Performed: Procedure(s) (LRB): CORONARY ARTERY BYPASS GRAFTING (CABG) (N/A)  Patient Location: Nursing Unit  Anesthesia Type: General  Level of Consciousness: awake, alert , oriented and patient cooperative  Airway and Oxygen Therapy: Patient Spontanous Breathing  Post-op Pain: none  Post-op Assessment: Post-op Vital signs reviewed, Patient's Cardiovascular Status Stable, Respiratory Function Stable, RESPIRATORY FUNCTION UNSTABLE, No signs of Nausea or vomiting, Adequate PO intake and Pain level controlled  Post-op Vital Signs: Reviewed  Complications: No apparent anesthesia complications

## 2011-05-29 NOTE — Progress Notes (Signed)
Pt ambulated x1 assist 350 feet. Patient tolerated well, rested one time. Assisted patient to bathroom willmonitor patient . Kimyetta Flott, Randall An RN

## 2011-05-29 NOTE — Progress Notes (Signed)
UR Completed.  [name] [name] Jane [phone] 05/29/2011  

## 2011-05-30 LAB — GLUCOSE, CAPILLARY
Glucose-Capillary: 108 mg/dL — ABNORMAL HIGH (ref 70–99)
Glucose-Capillary: 98 mg/dL (ref 70–99)
Glucose-Capillary: 97 mg/dL (ref 70–99)
Glucose-Capillary: 133 mg/dL — ABNORMAL HIGH (ref 70–99)

## 2011-05-30 MED ORDER — POTASSIUM CHLORIDE CRYS ER 20 MEQ PO TBCR: 20.0000 meq | EXTENDED_RELEASE_TABLET | Freq: Every day | ORAL | Status: DC

## 2011-05-30 MED ORDER — POTASSIUM CHLORIDE CRYS ER 20 MEQ PO TBCR
20.0000 meq | EXTENDED_RELEASE_TABLET | Freq: Once | ORAL | Status: AC
  Administered 2011-05-30: 20 meq via ORAL
  Filled 2011-05-30: qty 1

## 2011-05-30 MED ORDER — METOPROLOL TARTRATE 25 MG PO TABS
25.0000 mg | ORAL_TABLET | Freq: Two times a day (BID) | ORAL | Status: DC
  Administered 2011-05-30 – 2011-05-31 (×2): 25 mg via ORAL
  Filled 2011-05-30 (×3): qty 1

## 2011-05-30 MED ORDER — FUROSEMIDE 40 MG PO TABS: 40.0000 mg | ORAL_TABLET | Freq: Every day | ORAL | Status: DC

## 2011-05-30 MED ORDER — POLYSACCHARIDE IRON COMPLEX 150 MG PO CAPS: 150.0000 mg | ORAL_CAPSULE | Freq: Every day | ORAL | Status: DC

## 2011-05-30 MED ORDER — OXYCODONE HCL 5 MG PO TABS: 5.0000 mg | ORAL_TABLET | ORAL | Status: AC | PRN

## 2011-05-30 MED ORDER — FUROSEMIDE 40 MG PO TABS
40.0000 mg | ORAL_TABLET | Freq: Once | ORAL | Status: AC
  Administered 2011-05-30: 40 mg via ORAL
  Filled 2011-05-30: qty 1

## 2011-05-30 MED ORDER — ASPIRIN 325 MG PO TBEC: 325.0000 mg | DELAYED_RELEASE_TABLET | Freq: Every day | ORAL | Status: AC

## 2011-05-30 MED ORDER — SIMVASTATIN 20 MG PO TABS: 20.0000 mg | ORAL_TABLET | Freq: Every day | ORAL | Status: DC

## 2011-05-30 MED ORDER — METOPROLOL TARTRATE 25 MG PO TABS: 25.0000 mg | ORAL_TABLET | Freq: Two times a day (BID) | ORAL | Status: DC

## 2011-05-30 MED ORDER — CYCLOBENZAPRINE HCL 7.5 MG PO TABS: 7.5000 mg | ORAL_TABLET | Freq: Three times a day (TID) | ORAL | Status: AC | PRN

## 2011-05-30 MED FILL — Sodium Chloride Irrigation Soln 0.9%: Qty: 3000 | Status: AC

## 2011-05-30 MED FILL — Mannitol IV Soln 20%: INTRAVENOUS | Qty: 500 | Status: AC

## 2011-05-30 MED FILL — Heparin Sodium (Porcine) Inj 1000 Unit/ML: INTRAMUSCULAR | Qty: 10 | Status: AC

## 2011-05-30 MED FILL — Lidocaine HCl IV Inj 20 MG/ML: INTRAVENOUS | Qty: 5 | Status: AC

## 2011-05-30 MED FILL — Heparin Sodium (Porcine) Inj 1000 Unit/ML: INTRAMUSCULAR | Qty: 30 | Status: AC

## 2011-05-30 MED FILL — Electrolyte-R (PH 7.4) Solution: INTRAVENOUS | Qty: 5000 | Status: AC

## 2011-05-30 MED FILL — Sodium Bicarbonate IV Soln 8.4%: INTRAVENOUS | Qty: 50 | Status: AC

## 2011-05-30 MED FILL — Sodium Chloride IV Soln 0.9%: INTRAVENOUS | Qty: 1000 | Status: AC

## 2011-05-30 NOTE — Progress Notes (Signed)
Pt EPW pulled per protocol and ans ordered pt bp 129/60 heart rate NSR 88. All ends intact pt tolerated well pt reminded to lie supine approximately one hour. Pt tolerated well. Will monitor patient. [name] [name], Randall An RN

## 2011-05-30 NOTE — Progress Notes (Signed)
Pt ambulated to 2028 from 2018. Pt inroom and tolerated well will monitor patient. Delmer Kowalski, Randall An RN

## 2011-05-30 NOTE — Progress Notes (Signed)
CSW carried Medicaid application to Financial Counseling for submission to DSS. FL2 and LOG placed in shadow chart for MD signature. CSW faxed pt out to [facility] and [employer]. Pt accepted for admission to [facility] in [place], if bed available tomorrow.  CSW will continue to follow.  Baxter Flattery, MSW [phone]

## 2011-05-30 NOTE — Discharge Summary (Signed)
Physician Discharge Summary  Patient ID: [name] [name] MRN: [id number] DOB/AGE: [birth year] 61 y.o.  Admit date: 05/22/2011 Discharge date: 05/31/2011  Admission Diagnoses: 1.CAD (left main included) 2.Unstable angina 3.History of tobacco abuse (quit in 2008) 4.History of hypertension 5.History of depression  Discharge Diagnoses:  1.CAD (left main included) 2.Unstable angina 3.History of tobacco abuse (quit in 2008) 4.History of hypertension 5.History of depression 6.ABL anemia 7.Back spasms  Procedures:1.Cardiac catheterization by Dr. Herbie Baltimore on 05/24/2011:             LMCA - distal 60-70% calcified stenosis involving ostium of LCx  LAD -- Large proximal SP, at branch point of Large D1 there is a 30% lesion involving D1; small D2; rest of LAD angiographically normal  LCx - Ostial calcified ~60% lesion; Large OM1, small OM2, small AVGroove branch with distal LCx terminating as large OM3 with several small branches  RCA -- proximal ~30% at first bend; moderate RV marginal --> distal vessel gives risk to large RPDA and several small RPL branches, no significant lesions.  Well Preserved LV Function - EF ~60-65% with no WMA  Hemodynamics:  AoP: 124/62 mmHg ; 88 mmHg  LVP: 124/13MmHg ; 2. Coronary artery bypass grafting x2 (left internal mammary artery to  left anterior descending artery, saphenous vein graft to obtuse  marginal 2) with endoscopic harvest of right leg greater saphenous vein by Dr. Donata Clay on 05/26/2011.  History of Presenting Illness: This  is a 61 year old Caucasian female with a history of hypertension and a 35-pack-year history of smoking (quit in 2008) who presented to the emergency department with dizziness, palpitations, and chest discomfort. EKG showed some nonspecific ST segment changes and cardiac enzymes were negative. A cardiac CT scan showed a left main stenosis and the patient was admitted by cardiology. A cardiac catheterization was performed by Dr.  Herbie Baltimore on 05/24/2011 via the right radial artery. Results demonstrated an 80% left main stenosis, LV function was normal, and LVEDP 12 mmHg. 2-D echo was done on 05/26/2011.Results showed a 50-65% LVEF, no significant valvular disease, and no pericardial effusion.A cardiothoracic consultation was obtained with Dr. Donata Clay for the consideration of coronary bypass grafting surgery. Preoperative carotid duplex carotid ultrasound showed no significant internal carotid artery stenosis bilaterally. Potential risks, benefits, and complications of the surgery discussed with the patient she agreed to proceed. She underwent CABG x2 on 05/26/2011.   Brief Hospital Course:  She was extubated successfully early the evening of surgery. She remained afebrile and hemodynamic is stable. Her Swan-Ganz, A-line, chest tubes, and Foley were all removed early in her postoperative course. She was started on a low-dose beta blocker. She was found to be volume overloaded and diuresed accordingly. She was found have acute blood loss anemia. Her H&H was low as 8.9 and 26.8. She did not require postoperative transfusion. She was felt surgically stable for transfer from the intensive care unit to PCT U. for further convalescence on 05/28/2011. She continued to progress with cardiac rehabilitation. She did have complaints of left-sided back  spasms. She was given Flexeril and warm compress with relief. She was tolerating a diet and had a bowel movement. Her epicardial pacing wires were removed today. Her chest tube sutures were removed in the morning. Provided she remains afebrile, hemodynamic a stable, and pending morning round evaluation, she'll be surgically stable for discharge to an SNF.  Latest Vital Signs: Blood pressure 112/59, pulse 84, temperature 99 F (37.2 C), temperature source Oral, resp. rate 18, height 5\' 4"  (  1.626 m), weight 174 lb (78.926 kg), SpO2 91.00%.  Physical Exam:  Cardiovascular: RRR, no murmurs, gallops,  or rubs.  Pulmonary: Slightly diminished at bases; no rales, wheezes, or rhonchi.  Abdomen: Soft, non tender, bowel sounds present.  Extremities: Mild bilateral lower extremity edema.  Wounds: Clean and dry. No erythema or signs of infection.   Discharge Condition:Stable  Recent laboratory studies:  Lab Results  Component Value Date   WBC 7.8 05/29/2011   HGB 8.9* 05/29/2011   HCT 26.8* 05/29/2011   MCV 85.4 05/29/2011   PLT 263 05/29/2011   Lab Results  Component Value Date   NA 133* 05/29/2011   K 3.4* 05/29/2011   CL 99 05/29/2011   CO2 29 05/29/2011   CREATININE 0.72 05/29/2011   GLUCOSE 105* 05/29/2011      Diagnostic Studies: Dg Chest 2 View  05/29/2011  *RADIOLOGY REPORT*  Clinical Data: Cough, shortness of breath, post CABG  CHEST - 2 VIEW  Comparison: 05/28/2011  Findings: Enlargement of cardiac silhouette post CABG. Interval removal of right jugular line. Minimal pulmonary vascular cephalization. Persistent left lower lobe atelectasis versus consolidation. Small bibasilar effusions greater on left. No pneumothorax. No acute osseous findings.  IMPRESSION: Persistent atelectasis versus consolidation of left lower lobe. Small bibasilar effusions, greater on the left.  Original Report Authenticated By: Lollie Marrow, M.D.    Discharge Medications: Medication List  As of 05/30/2011 11:27 AM   STOP taking these medications         atenolol-chlorthalidone 100-25 MG per tablet         TAKE these medications         aspirin 325 MG EC tablet   Take 1 tablet (325 mg total) by mouth daily.      cyclobenzaprine 7.5 MG tablet   Commonly known as: FEXMID   Take 1 tablet (7.5 mg total) by mouth 3 (three) times daily as needed for muscle spasms.      furosemide 40 MG tablet   Commonly known as: LASIX   Take 1 tablet (40 mg total) by mouth daily. For 5 days then stop.      iron polysaccharides 150 MG capsule   Commonly known as: NIFEREX   Take 1 capsule (150 mg total) by mouth  daily. For one month then stop.      metoprolol tartrate 25 MG tablet   Commonly known as: LOPRESSOR   Take 1 tablet (25 mg total) by mouth 2 (two) times daily.      mulitivitamin with minerals Tabs   Take 1 tablet by mouth daily.      oxyCODONE 5 MG immediate release tablet   Commonly known as: Oxy IR/ROXICODONE   Take 1-2 tablets (5-10 mg total) by mouth every 4 (four) hours as needed for pain.      potassium chloride SA 20 MEQ tablet   Commonly known as: K-DUR,KLOR-CON   Take 1 tablet (20 mEq total) by mouth daily. For 5 days then stop.      simvastatin 20 MG tablet   Commonly known as: ZOCOR   Take 1 tablet (20 mg total) by mouth daily at 6 PM.      VITAMIN D-3 PO   Take 1 capsule by mouth daily.            Follow Up Appointments: Follow-up Information    Follow up with VAN Dinah Beers, MD. (PA/LAT CXR to be taken on 06/19/2011 at 12:30 pm;Appointment with Dr. Zenaida Niece Trigt's PA is  on 06/19/2011 at 1:30 pm)    Contact information:   [address] [place] [address] [phone]       Follow up with Marykay Lex, MD. (Call for a follow up for 2 weeks)    Contact information:   [facility] [address], [address] [place] [address] [phone]          Signed: Doree Fudge MPA-C 05/30/2011, 11:27 AM

## 2011-05-30 NOTE — Progress Notes (Signed)
Pt ambulated in hallway with assistance, 350 ft t tolerated well. [name] [name] A

## 2011-05-30 NOTE — Progress Notes (Signed)
CARDIAC REHAB PHASE I   PRE:  Rate/Rhythm: 95SR  BP:  Supine:   Sitting: 110/62  Standing:    SaO2: 965RA  MODE:  Ambulation: 550 ft   POST:  Rate/Rhythem: 102ST  BP:  Supine:   Sitting: 128/64  Standing:    SaO2: 96%RA 1012-1027s Pt walked 550 ft on RA with handheld asst. Stopped twice to rest. Tolerated well. Slightly  SOB during walk but sats good on RA.   [name] [name]

## 2011-05-30 NOTE — Discharge Instructions (Signed)
Activity: 1.May walk up steps                2.No lifting more than ten pounds for four weeks.                 3.No driving for four weeks.                4.Stop any activity that causes chest pain, shortness of breath, dizziness, sweating or excessive weakness.                5.Avoid straining.                6.Continue with your breathing exercises daily.  Diet: Low fat, Low salt diet  Wound Care: May shower.  Clean wounds with mild soap and water daily. Contact the office at [phone] if any problems arise.  Coronary Artery Bypass Grafting Care After Refer to this sheet in the next few weeks. These instructions provide you with information on caring for yourself after your procedure. Your caregiver may also give you more specific instructions. Your treatment has been planned according to current medical practices, but problems sometimes occur. Call your caregiver if you have any problems or questions after your procedure.  Recovery from open heart surgery will be different for everyone. Some people feel well after 3 or 4 weeks, while for others it takes longer. After heart surgery, it may be normal to:  Not have an appetite, feel nauseated by the smell of food, or only want to eat a small amount.   Be constipated because of changes in your diet, activity, and medicines. Eat foods high in fiber. Add fresh fruits and vegetables to your diet. Stool softeners may be helpful.   Feel sad or unhappy. You may be frustrated or cranky. You may have good days and bad days. Do not give up. Talk to your caregiver if you do not feel better.   Feel weakness and fatigue. You many need physical therapy or cardiac rehabilitation to get your strength back.   Develop an irregular heartbeat called atrial fibrillation. Symptoms of atrial fibrillation are a fast, irregular heartbeat or feelings of fluttery heartbeats, shortness of breath, low blood pressure, and dizziness. If these symptoms develop, see your  caregiver right away.  MEDICATION  Have a list of all the medicines you will be taking when you leave the hospital. For every medicine, know the following:   Name.   Exact dose.   Time of day to be taken.   How often it should be taken.   Why you are taking it.   Ask which medicines should or should not be taken together. If you take more than one heart medicine, ask if it is okay to take them together. Some heart medicines should not be taken at the same time because they may lower your blood pressure too much.   Narcotic pain medicine can cause constipation. Eat fresh fruits and vegetables. Add fiber to your diet. Stool softener medicine may help relieve constipation.   Keep a copy of your medicines with you at all times.   Do not add or stop taking any medicine until you check with your caregiver.   Medicines can have side effects. Call your caregiver who prescribed the medicine if you:   Start throwing up, have diarrhea, or have stomach pain.   Feel dizzy or lightheaded when you stand up.   Feel your heart is skipping beats or is beating too fast   or too slow.   Develop a rash.   Notice unusual bruising or bleeding.  HOME CARE INSTRUCTIONS  After heart surgery, it is important to learn how to take your pulse. Have your caregiver show you how to take your pulse.   Use your incentive spirometer. Ask your caregiver how long after surgery you need to use it.  Care of your chest incision  Tell your caregiver right away if you notice clicking in your chest (sternum).   Support your chest with a pillow or your arms when you take deep breaths and cough.   Follow your caregiver's instructions about when you can bathe or swim.   Protect your incision from sunlight during the first year to keep the scar from getting dark.   Tell your caregiver if you notice:   Increased tenderness of your incision.   Increased redness or swelling around your incision.   Drainage or pus  from your incision.  Care of your leg incision(s)  Avoid crossing your legs.   Avoid sitting for long periods of time. Change positions every half hour.   Elevate your leg(s) when you are sitting.   Check your leg(s) daily for swelling. Check the incisions for redness or drainage.   Wear your elastic stockings as told by your caregiver. Take them off at bedtime.  Diet  Diet is very important to heart health.   Eat plenty of fresh fruits and vegetables. Meats should be lean cut. Avoid canned, processed, and fried foods.   Talk to a dietician. They can teach you how to make healthy food and drink choices.  Weight  Weigh yourself every day. This is important because it helps to know if you are retaining fluid that may make your heart and lungs work harder.   Use the same scale each time.   Weigh yourself every morning at the same time. You should do this after you go to the bathroom, but before you eat breakfast.   Your weight will be more accurate if you do not wear any clothes.   Record your weight.   Tell your caregiver if you have gained 2 pounds or more overnight.  Activity Stop any activity at once if you have chest pain, shortness of breath, irregular heartbeats, or dizziness. Get help right away if you have any of these symptoms.  Bathing.  Avoid soaking in a bath or hot tub until your incisions are healed.   Rest. You need a balance of rest and activity.   Exercise. Exercise per your caregiver's advice. You may need physical therapy or cardiac rehabilitation to help strengthen your muscles and build your endurance.   Climbing stairs. Unless your caregiver tells you not to climb stairs, go up stairs slowly and rest if you tire. Do not pull yourself up by the handrail.   Driving a car. Follow your caregiver's advice on when you may drive. You may ride as a passenger at any time. When traveling for long periods of time in a car, get out of the car and walk around for a  few minutes every 2 hours.   Lifting. Avoid lifting, pushing, or pulling anything heavier than 10 pounds for 6 weeks after surgery or as told by your caregiver.   Returning to work. Check with your caregiver. People heal at different rates. Most people will be able to go back to work 6 to 12 weeks after surgery.   Sexual activity. You may resume sexual relations as told by your   caregiver.  SEEK MEDICAL CARE IF:  Any of your incisions are red, painful, or have any type of drainage coming from them.   You have an oral temperature above 102 F (38.9 C).   You have ankle or leg swelling.   You have pain in your legs.   You have weight gain of 2 or more pounds a day.   You feel dizzy or lightheaded when you stand up.  SEEK IMMEDIATE MEDICAL CARE IF:  You have angina or chest pain that goes to your jaw or arms. Call your local emergency services right away.   You have shortness of breath at rest or with activity.   You have a fast or irregular heartbeat (arrhythmia).   There is a "clicking" in your sternum when you move.   You have numbness or weakness in your arms or legs.  MAKE SURE YOU:  Understand these instructions.   Will watch your condition.   Will get help right away if you are not doing well or get worse.  Document Released: 08/19/2004 Document Revised: 01/19/2011 Document Reviewed: 04/06/2010 [employer] Patient Information 2012 [employer].   

## 2011-05-30 NOTE — Progress Notes (Addendum)
Subjective: No complaints though is aware of increased heart rate and lower ext. Edema; Back spasms improved.  Objective: Vital signs in last 24 hours: Temp:  [99 F (37.2 C)-99.8 F (37.7 C)] 99 F (37.2 C) (04/16 0401) Pulse Rate:  [84-89] 84  (04/16 0946) Resp:  [18] 18  (04/16 0401) BP: (112-129)/(53-68) 112/59 mmHg (04/16 0946) SpO2:  [90 %-94 %] 91 % (04/16 0946) Weight:  [78.926 kg (174 lb)] 78.926 kg (174 lb) (04/16 0500) Weight change: 0.726 kg (1 lb 9.6 oz) Last BM Date: 05/29/11 Intake/Output from previous day: -270 04/15 0701 - 04/16 0700 In: 480 [P.O.:480] Out: 750 [Urine:750] Intake/Output this shift:    PE: General:alert and oriented, pleasant affect, pacer wires out today Heart:S1S2 rapid but regular ; No M/R/G Lungs: CTAB, non-labored Abd:+ BS, soft, non tender Ext:1-2+ edema    Lab Results:  Basename 05/29/11 0630 05/28/11 0401  WBC 7.8 10.4  HGB 8.9* 8.9*  HCT 26.8* 27.5*  PLT 263 213   BMET  Basename 05/29/11 0630 05/28/11 0401  NA 133* 133*  K 3.4* 4.1  CL 99 100  CO2 29 28  GLUCOSE 105* 111*  BUN 18 13  CREATININE 0.72 0.66  CALCIUM 8.4 8.1*   No results found for this basename: TROPONINI:2,CK,MB:2 in the last 72 hours  Lab Results  Component Value Date   CHOL 143 05/25/2011   HDL 49 05/25/2011   LDLCALC 68 05/25/2011   TRIG 130 05/25/2011   CHOLHDL 2.9 05/25/2011   Lab Results  Component Value Date   HGBA1C 5.8* 05/25/2011     Lab Results  Component Value Date   TSH 0.635 05/23/2011  EKG:  Studies/Results: Personally reviewed by MD. [facility]) Dg Chest 2 View  05/29/2011  *RADIOLOGY REPORT*  Clinical Data: Cough, shortness of breath, post CABG  CHEST - 2 VIEW  Comparison: 05/28/2011  Findings: Enlargement of cardiac silhouette post CABG. Interval removal of right jugular line. Minimal pulmonary vascular cephalization. Persistent left lower lobe atelectasis versus consolidation. Small bibasilar effusions greater on left. No pneumothorax.  No acute osseous findings.  IMPRESSION: Persistent atelectasis versus consolidation of left lower lobe. Small bibasilar effusions, greater on the left.  Original Report Authenticated By: Lollie Marrow, M.D.    Medications: I have reviewed the patient's current medications.  Assessment/Plan: Patient Active Problem List  Diagnoses  . Unstable angina  . Abnormal screening cardiac CT, > 50% LMCA; Confirmed by Cardiac Cath 60-70% lesion  . Anxiety  . Depression  . Renal cyst  . History of smoking, quit 2008  . CAD (coronary artery disease) CABG X 2 05/26/11   PLAN: POST OP DAY #4  Coronary artery bypass grafting x2 (left internal mammary artery to  left anterior descending artery, saphenous vein graft to obtuse  Marginal. Lasix at 40 mg today. Maintaining  SR Hypokalemia- replaced yesterday. Increase lopressor to 12.5 every 8 hours.???  MD to address   LOS: 8 days   INGOLD,LAURA R 05/30/2011, 9:58 AM  Patient was seen & examined in conjunction with Nada Boozer, NP.  I agree with her findings, examination & asessment / recommendations.  Ms. Wolbert is well known to me - s/p 2V CABG for distal LM stenosis.  Presenting Sx was more dizziness with bradycardia, but did note several months of exertional chest discomfort & fatigue.  She has preserved LVEF & seems to be progressing well post-Op.   Narcotics & Muscle relaxants seem to be helping her back spasms.   She is  a bit down today - conscious of volume overload -- I explained the concept of 3rd spacing post-op & that Rx with lasix is common & not of concern & should only require short course of increased diuretics. I have titrated her Metoprolol dose to 25 mg bid as her HR is now stable in 80s-90s.  Her exam is essentially normal with mild edema.  She plans to go to short term SNF-Rehab based upon need for support @ home post-d/c with her daughter needing to continue at work, but I would anticipate this being short term; she is excited to  start Cardiac Rehab.  I will be happy to see her in f/u, but she may want initial visit to be at our [employer] office.     Marykay Lex, M.D., M.S. [facility] [address] [place], [place]  [address]  [phone] Pager # [phone]  05/30/2011 10:44 AM

## 2011-05-30 NOTE — Evaluation (Signed)
Physical Therapy Evaluation Patient Details Name: [name] [name] MRN: [id number] DOB: 09-09-1950 Today's Date: 05/30/2011  Problem List:  Patient Active Problem List  Diagnoses  . Unstable angina  . Abnormal screening cardiac CT, > 50% LMCA; Confirmed by Cardiac Cath 60-70% lesion  . Anxiety  . Depression  . Renal cyst  . History of smoking, quit 2008  . CAD (coronary artery disease) CABG X 2 05/26/11    Past Medical History:  Past Medical History  Diagnosis Date  . Hypertension   . IBS (irritable bowel syndrome)   . Anxiety   . Depression   . Complication of anesthesia     difficult waking up "  . Chronic kidney disease     cyst left kidney   Past Surgical History:  Past Surgical History  Procedure Date  . Abdominal hysterectomy   . Tonsillectomy   . Tubal ligation   . Coronary artery bypass graft 05/26/2011    Procedure: CORONARY ARTERY BYPASS GRAFTING (CABG);  Surgeon: Kerin Perna, MD;  Location: [facility] OR;  Service: Open Heart Surgery;  Laterality: N/A;  Coronary Artery Bypass Graft on Pump times two utlizing left internal mammary artery and right greater saphenous vein harvested endoscopically     PT Assessment/Plan/Recommendation PT Assessment Clinical Impression Statement: [name] is 61 y/o female s/p CABG on 05/26/11. Prior to surgery this pt was independent with all ADLs and gait and worked full time as a Lawyer. Pt now demonstrates decreased activity tolerance, balance and knowledge of her precautions/safety following major surgery. Rec SNF therapies for rehab as pt lives alone and will be unable to perform her ADLs/housework in addition to balance/gait deficits impacting her safety for d/c home without added assist. Will defer further therapies to the SNF.  Educated pt on sternal precautions and gradual progression of exercise as well as percieved exertion and self monitoring during therapies/ambulation.  PT Recommendation/Assessment: All further PT needs can be met in  the next venue of care PT Problem List: Decreased balance;Decreased activity tolerance;Decreased safety awareness;Decreased knowledge of precautions;Decreased knowledge of use of DME;Decreased mobility;Cardiopulmonary status limiting activity;Pain PT Therapy Diagnosis : Difficulty walking;Generalized weakness;Acute pain PT Recommendation Follow Up Recommendations: Skilled nursing facility Equipment Recommended: Defer to next venue    PT Evaluation Precautions/Restrictions  Precautions Precautions: Sternal;Fall Prior Functioning  Home Living Lives With: Alone Available Help at Discharge: Family;Neighbor;Available PRN/intermittently (daughter works during the day) Type of Home: House Home Access: Stairs to enter Secretary/administrator of Steps: 3 Home Layout: One level Bathroom Shower/Tub: Network engineer: None Prior Function Level of Independence: Independent Able to Take Stairs?: Yes Driving: Yes Vocation: Full time employment Comments: 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 another profession (will not be able to lift patients) Cognition Cognition Arousal/Alertness: Awake/alert (just woken up so a bit 'woozy' per pt report) Overall Cognitive Status: Appears within functional limits for tasks assessed Orientation Level: Oriented X4 Sensation/Coordination Sensation Light Touch: Appears Intact Coordination Gross Motor Movements are Fluid and Coordinated: Yes Fine Motor Movements are Fluid and Coordinated: Yes Extremity Assessment RUE Assessment RUE Assessment: Within Functional Limits LUE Assessment LUE Assessment: Within Functional Limits RLE Assessment RLE Assessment: Within Functional Limits LLE Assessment LLE Assessment: Within Functional Limits Mobility (including Balance) Bed Mobility Bed Mobility: Yes Rolling Right: With rail;5: Supervision Rolling Right Details  (indicate cue type and reason): verbal cues to decrease pulling on rail for sternal precautions Rolling  Left: 6: Modified independent (Device/Increase time) Right Sidelying to Sit: With rails;HOB elevated (comment degrees);5: Supervision (HOB elevated 40 degrees) Right Sidelying to Sit Details (indicate cue type and reason): cues for safe technique and decreased use of upper extremities to protect sternum Sitting - Scoot to Edge of Bed: 6: Modified independent (Device/Increase time) Sit to Sidelying Right: 5: Supervision Scooting to [facility]: 1: +2 Total assist (0%) Scooting to [facility] Details (indicate cue type and reason): pt with increased difficulty scooting HOB as she was trying to pull on the rail (even after specific instructions for sternal precautions); needed +2totalpt0% to scoot HOB Transfers Transfers: Yes Sit to Stand: 4: Min assist;Without upper extremity assist;From bed Sit to Stand Details (indicate cue type and reason): mingaurdA to stand from bed; cues for hand placement and for stability initially upon standing Stand to Sit: 4: Min assist Stand to Sit Details: cues for safe technique and hand placement Ambulation/Gait Ambulation/Gait: Yes Ambulation/Gait Assistance: 4: Min assist Ambulation/Gait Assistance Details (indicate cue type and reason): amb approx 10 ft without AD but very staggered gait needing minA to stabilize; gave pt RW and she ambulated approx 150 ft prior to becoming SOB and needing cueing to take rest breaks as needed (SaO2 read 70s but am unsure of the accuracy of this reading, the next reading was in the 90s, all on RA); pt them ambulated another 150 ft with 2 standing rest breaks to catch her breath; slow flexed gait, cues for safe technique with RW; following ambulation pt with c/o being cold and exhausted, with increased SOB (SaO2 89% and BP stable, RN made aware) Ambulation Distance (Feet): 300 Feet Assistive device: Rolling walker    Exercise    End of  Session PT - End of Session Equipment Utilized During Treatment: Gait belt Activity Tolerance: Patient tolerated treatment well;Patient limited by fatigue;Treatment limited secondary to medical complications (Comment) (end of session pt c/o cold and light headed; BP was stable) Patient left: in bed;with call bell in reach (RN present) Nurse Communication: Mobility status for transfers;Mobility status for ambulation General Behavior During Session: [facility] for tasks performed Cognition: [facility] for tasks performed  [facility] HELEN 05/30/2011, 3:40 PM

## 2011-05-30 NOTE — Progress Notes (Addendum)
4 Days Post-Op Procedure(s) (LRB): CORONARY ARTERY BYPASS GRAFTING (CABG) (N/A)  Subjective: Patient feeling better this am. Has had a bowel movement.Increased doseage of flexeril is helping with back spasms.  Objective: Vital signs in last 24 hours: Patient Vitals for the past 24 hrs:  BP Temp Temp src Pulse Resp SpO2 Weight  05/30/11 0500 - - - - - - 174 lb (78.926 kg)  05/30/11 0401 117/59 mmHg 99 F (37.2 C) Oral 88  18  94 % -  05/29/11 2023 120/57 mmHg 99.8 F (37.7 C) Oral 89  18  92 % -  05/29/11 1337 112/68 mmHg 99.6 F (37.6 C) Oral 87  18  90 % -  05/29/11 0951 118/57 mmHg - - 81  - - -   Pre op weight  74 kg Current Weight  05/30/11 174 lb (78.926 kg)      Intake/Output from previous day: 04/15 0701 - 04/16 0700 In: 480 [P.O.:480] Out: 750 [Urine:750]   Physical Exam:  Cardiovascular: RRR, no murmurs, gallops, or rubs. Pulmonary: Slightly diminished at bases; no rales, wheezes, or rhonchi. Abdomen: Soft, non tender, bowel sounds present. Extremities: Mild bilateral lower extremity edema. Wounds: Clean and dry.  No erythema or signs of infection.  Lab Results: CBC:  Basename 05/29/11 0630 05/28/11 0401  WBC 7.8 10.4  HGB 8.9* 8.9*  HCT 26.8* 27.5*  PLT 263 213   BMET:   Basename 05/29/11 0630 05/28/11 0401  NA 133* 133*  K 3.4* 4.1  CL 99 100  CO2 29 28  GLUCOSE 105* 111*  BUN 18 13  CREATININE 0.72 0.66  CALCIUM 8.4 8.1*    PT/INR: No results found for this basename: LABPROT,INR in the last 72 hours ABG:  INR: Will add last result for INR, ABG once components are confirmed Will add last 4 CBG results once components are confirmed  Assessment/Plan:  1. CV - SR.Continue Lopressor 12. 5 bid. 2.  Pulmonary - Encourage incentive spirometer. 3. Volume Overload - Continue with diuresis.Will give additional Lasix this afternoon. 4.  Acute blood loss anemia - H/H this am 8.9/26.8. Start Nu Iron. 5.Flexeril,Percocet, and warm compress for back  spasms. 7.Possible discharge on Wednesday or when SNF available.   ZIMMERMAN,DONIELLE MPA-C 05/30/2011    Agree with assessment and plan to discharge to SNF in am if no change

## 2011-05-31 LAB — GLUCOSE, CAPILLARY
Glucose-Capillary: 105 mg/dL — ABNORMAL HIGH (ref 70–99)
Glucose-Capillary: 99 mg/dL (ref 70–99)

## 2011-05-31 LAB — BASIC METABOLIC PANEL
BUN: 13 mg/dL (ref 6–23)
CO2: 28 mEq/L (ref 19–32)
Calcium: 8.6 mg/dL (ref 8.4–10.5)
Chloride: 104 mEq/L (ref 96–112)
Creatinine, Ser: 0.64 mg/dL (ref 0.50–1.10)
GFR calc Af Amer: >90 mL/min (ref >90)
GFR calc non Af Amer: >90 mL/min (ref >90)
Glucose, Bld: 100 mg/dL — ABNORMAL HIGH (ref 70–99)
Potassium: 4.2 mEq/L (ref 3.5–5.1)
Sodium: 140 mEq/L (ref 135–145)

## 2011-05-31 MED ORDER — FUROSEMIDE 10 MG/ML IJ SOLN
40.0000 mg | Freq: Once | INTRAMUSCULAR | Status: AC
  Administered 2011-05-31: 40 mg via INTRAVENOUS
  Filled 2011-05-31: qty 4

## 2011-05-31 NOTE — Progress Notes (Signed)
5 Days Post-Op Procedure(s) (LRB): CORONARY ARTERY BYPASS GRAFTING (CABG) (N/A)  Subjective: Patient had an episode last evening where after coughing, she felt cold then broke out in a sweat.She had some incisional pain, but denied shortness of breath or chest pain.  Objective: Vital signs in last 24 hours: Patient Vitals for the past 24 hrs:  BP Temp Temp src Pulse Resp SpO2  05/31/11 0546 112/70 mmHg 97.9 F (36.6 C) Oral 75  18  94 %  05/30/11 2047 117/69 mmHg 98.8 F (37.1 C) Oral 83  18  91 %  05/30/11 1510 - - - - - 89 %  05/30/11 1412 110/68 mmHg 98.9 F (37.2 C) Oral 80  18  92 %  05/30/11 0946 112/59 mmHg - - 84  - 91 %  05/30/11 0920 115/56 mmHg - - 84  - -  05/30/11 0906 126/53 mmHg - - 88  - 93 %  05/30/11 0900 129/63 mmHg - - 88  - 93 %   Pre op weight  74 kg Current Weight  05/30/11 174 lb (78.926 kg)      Intake/Output from previous day: 04/16 0701 - 04/17 0700 In: 1560 [P.O.:1560] Out: 2325 [Urine:2325]   Physical Exam:  Cardiovascular: RRR, no murmurs, gallops, or rubs. Pulmonary: Slightly diminished at bases; no rales, wheezes, or rhonchi. Abdomen: Soft, non tender, bowel sounds present. Extremities: Mild bilateral lower extremity edema.Ecchymosis right thigh. Wounds: Clean and dry.  No erythema or signs of infection.  Lab Results: CBC:  Basename 05/29/11 0630  WBC 7.8  HGB 8.9*  HCT 26.8*  PLT 263   BMET:   Basename 05/29/11 0630  NA 133*  K 3.4*  CL 99  CO2 29  GLUCOSE 105*  BUN 18  CREATININE 0.72  CALCIUM 8.4    PT/INR: No results found for this basename: LABPROT,INR in the last 72 hours ABG:  INR: Will add last result for INR, ABG once components are confirmed Will add last 4 CBG results once components are confirmed  Assessment/Plan:  1. CV - SR.Continue Lopressor 25 bid. 2.  Pulmonary - Encourage incentive spirometer. 3. Volume Overload - Continue with diuresis.Will give  Lasix IV this am. 4.  Acute blood loss anemia  - H/H this am 8.9/26.8. Continue Nu Iron. 5.Flexeril,Percocet, and warm compress for back spasms. 6.Remove CT sutures. 7.Possible discharge today if SNF available.   Odeal Welden MPA-C 05/31/2011

## 2011-05-31 NOTE — Progress Notes (Signed)
Cardiac Rehab [phone] Pt had already walked independently so I did not walk with pt. Education completed. Permission given to refer to [facility] Phase 2. Financial form given. Nishi Neiswonger DunlapRN

## 2011-05-31 NOTE — Progress Notes (Signed)
Subjective:  no complaints, coughing last pm  Objective: Vital signs in last 24 hours: Temp:  [97.9 F (36.6 C)-98.9 F (37.2 C)] 97.9 F (36.6 C) (04/17 0546) Pulse Rate:  [75-83] 75  (04/17 0546) Resp:  [18] 18  (04/17 0546) BP: (110-117)/(68-70) 112/70 mmHg (04/17 0546) SpO2:  [89 %-94 %] 94 % (04/17 0546) Weight change:  Last BM Date: 05/29/11 Intake/Output from previous day: -765 04/16 0701 - 04/17 0700 In: 1560 [P.O.:1560] Out: 2325 [Urine:2325] Intake/Output this shift:    PE: General:alert and oriented NO JVD Heart:S1S2 RRR Lungs:diminished in bases Abd:+ BS, soft, non tender Ext: + 1 edema Neuro:A & O X 3, MAE.   Lab Results:  Basename 05/29/11 0630  WBC 7.8  HGB 8.9*  HCT 26.8*  PLT 263   BMET  Basename 05/31/11 0635 05/29/11 0630  NA 140 133*  K 4.2 3.4*  CL 104 99  CO2 28 29  GLUCOSE 100* 105*  BUN 13 18  CREATININE 0.64 0.72  CALCIUM 8.6 8.4   No results found for this basename: TROPONINI:2,CK,MB:2 in the last 72 hours  Lab Results  Component Value Date   CHOL 143 05/25/2011   HDL 49 05/25/2011   LDLCALC 68 05/25/2011   TRIG 130 05/25/2011   CHOLHDL 2.9 05/25/2011   Lab Results  Component Value Date   HGBA1C 5.8* 05/25/2011     Lab Results  Component Value Date   TSH 0.635 05/23/2011    Hepatic Function Panel No results found for this basename: PROT,ALBUMIN,AST,ALT,ALKPHOS,BILITOT,BILIDIR,IBILI in the last 72 hours No results found for this basename: CHOL in the last 72 hours No results found for this basename: PROTIME in the last 72 hours    EKG: Orders placed during the hospital encounter of 05/22/11  . ED EKG  . EKG 12-LEAD  . EKG 12-LEAD  . EKG 12-LEAD  . EKG 12-LEAD  . EKG 12-LEAD  . EKG 12-LEAD  . EKG 12-LEAD  . EKG 12-LEAD  . EKG 12-LEAD  . EKG 12-LEAD    Studies/Results: No results found.  Medications: I have reviewed the patient's current medications.    Marland Kitchen acetaminophen  1,000 mg Oral Q6H   Or  .  acetaminophen (TYLENOL) oral liquid 160 mg/5 mL  975 mg Per Tube Q6H  . aspirin EC  325 mg Oral Daily  . bisacodyl  10 mg Oral Daily   Or  . bisacodyl  10 mg Rectal Daily  . docusate sodium  200 mg Oral Daily  . furosemide  40 mg Intravenous Once  . furosemide  40 mg Oral Once  . iron polysaccharides  150 mg Oral Daily  . metoprolol tartrate  25 mg Oral BID  . mulitivitamin with minerals  1 tablet Oral Daily  . pantoprazole  40 mg Oral Q1200  . potassium chloride  20 mEq Oral Daily  . potassium chloride  20 mEq Oral Once  . simvastatin  20 mg Oral q1800  . sodium chloride  3 mL Intravenous Q12H  . DISCONTD: furosemide  40 mg Oral Daily  . DISCONTD: metoprolol tartrate  12.5 mg Oral BID   Assessment/Plan: Patient Active Problem List  Diagnoses  . Unstable angina  . Abnormal screening cardiac CT, > 50% LMCA; Confirmed by Cardiac Cath 60-70% lesion  . Anxiety  . Depression  . Renal cyst  . History of smoking, quit 2008  . CAD (coronary artery disease) CABG X 2 05/26/11   PLAN: 5 Days Post-Op Procedure(s) (LRB):  CORONARY ARTERY BYPASS GRAFTING (CABG) Improving . Tachycardia at 108 this am now rate controlled all SR/ST To receive IV Lasix today.  Poss d/c to SNF today   LOS: 9 days   [name] [name],[name] [name] 05/31/2011, 9:52 AM   Patient seen and examined. Agree with assessment and plan.  Pt walking without assistance. Discussed cardiac rehab.  Pt to be dc to SNF since she lives alone.  Will f/u as outpt post dc.   Lennette Bihari, MD, [facility] 05/31/2011 10:17 AM

## 2011-05-31 NOTE — Progress Notes (Signed)
Pt has accepted bed offer at [facility] [place]. Pt daughter will transport pt to facility. RN advised and preparing pt for d/c. Packet placed in [place]. CSW signing off with appreciation to MD (Dr Laneta Simmers) for signing [place]@ to facilitate timely d/c.  Baxter Flattery, MSW [phone]

## 2011-06-01 ENCOUNTER — Telehealth: Payer: Self-pay | Admitting: *Deleted

## 2011-06-01 NOTE — Telephone Encounter (Signed)
Ms. Biggers' daughter called to say that her mother called her this morning to say she could no longer stay at the [facility] in [place].  She did not like it there, it was too loud, she didn't get any sleep last night and she wasn't going to stay.  I called patient care management and asked their advice.  They said that once the patient is d/c'd it is the responsibility of the social worker at that facility to find other accommodations.  Otherwise, the daughter said she will have her mother come and stay with her and she will take off that time.  I told her that [facility] would arrange for home health and P.T. to come to her home.  She will call her mother and see what she wants to do. Her mother was d/c'd from [facility] 05/31/11.

## 2011-06-05 ENCOUNTER — Telehealth: Payer: Self-pay | Admitting: *Deleted

## 2011-06-05 NOTE — Discharge Summary (Signed)
patient examined and medical record reviewed,agree with above note. VAN TRIGT III,Kent Riendeau 06/05/2011    

## 2011-06-05 NOTE — Telephone Encounter (Signed)
[name] [name] called to say she was ready to leave the skilled facility and she had fulfilled the requirement that Dr. Donata Clay had planned for her.  She was now going to be able to go home with her daughter who was going to take time off to stay home with her this week. I faxed a d/c order to the social worker, Janann August.

## 2011-06-06 ENCOUNTER — Other Ambulatory Visit: Payer: Self-pay

## 2011-06-06 DIAGNOSIS — D649 Anemia, unspecified: Secondary | ICD-10-CM

## 2011-06-06 MED ORDER — POLYSACCHARIDE IRON COMPLEX 150 MG PO CAPS: 150.0000 mg | ORAL_CAPSULE | Freq: Every day | ORAL | Status: DC

## 2011-06-06 NOTE — Telephone Encounter (Signed)
Called RX for Niferex 150 mg po every day for 30 days. To [facility] in [place]

## 2011-06-08 ENCOUNTER — Telehealth: Payer: Self-pay

## 2011-06-08 NOTE — Telephone Encounter (Signed)
Pt is schedule to see PA for 1st post op visit on 06/19/2011. She was instructed to call office if fever increases.

## 2011-06-15 ENCOUNTER — Other Ambulatory Visit: Payer: Self-pay | Admitting: Cardiothoracic Surgery

## 2011-06-15 DIAGNOSIS — I2510 Atherosclerotic heart disease of native coronary artery without angina pectoris: Secondary | ICD-10-CM

## 2011-06-19 ENCOUNTER — Ambulatory Visit
Admission: RE | Admit: 2011-06-19 | Discharge: 2011-06-19 | Disposition: A | Discharge status: Home / Self Care | Payer: No Typology Code available for payment source | Source: Ambulatory Visit | Attending: Cardiothoracic Surgery | Admitting: Cardiothoracic Surgery

## 2011-06-19 ENCOUNTER — Ambulatory Visit (INDEPENDENT_AMBULATORY_CARE_PROVIDER_SITE_OTHER): Payer: Self-pay | Admitting: Physician Assistant

## 2011-06-19 VITALS — BP 131/80 | HR 87 | Resp 16 | Ht 64.0 in | Wt 160.0 lb

## 2011-06-19 DIAGNOSIS — Z09 Encounter for follow-up examination after completed treatment for conditions other than malignant neoplasm: Secondary | ICD-10-CM

## 2011-06-19 DIAGNOSIS — I2510 Atherosclerotic heart disease of native coronary artery without angina pectoris: Secondary | ICD-10-CM

## 2011-06-19 DIAGNOSIS — Z951 Presence of aortocoronary bypass graft: Secondary | ICD-10-CM

## 2011-06-19 MED ORDER — ATORVASTATIN CALCIUM 10 MG PO TABS: 10.0000 mg | ORAL_TABLET | Freq: Every day | ORAL | Status: DC

## 2011-06-19 MED ORDER — METOPROLOL TARTRATE 25 MG PO TABS: 25.0000 mg | ORAL_TABLET | Freq: Two times a day (BID) | ORAL | Status: DC

## 2011-06-19 NOTE — Progress Notes (Signed)
HPI:  Ms. [name] [name] is a 61 yo white female who is S/P CABG x2 performed on 05/26/2011 who presents today for post operative follow-up.  Overall the patient states she doesn't feel she is doing very well.  She states she is very fatigued and continues to have productive cough at times.  She states that she is only able to ambulate for about 5 minutes before she states her feet feel like lead and she just cant go any further.  She states she has these coughing episodes where she will cough up very thick clear sputum.  Finally, she states she has been very depressed.  I explained to the patient that she is progressing on the right course since surgery.  I explained to the patient that she is doing the right thing by walking as much as she tolerates, and assured her that her strength will improve over time.  In regards to her cough she was instructed that she could use Mucinex. In regards to her depression I explained that this is a very normal reaction to this type of surgery.  I explained that she will have good and bad days, and she should go out and about when she feels up to it.  In regards to antidepressant I explained to the patient that being she does not have a PCP who could monitor this medication, I did not feel comfortable prescribing her one.  She states once she gets her Medicaid approved, she will establish care and I encouraged her to discuss this with whomever she establishes care with.  She was agreeable to this.  Current Outpatient Prescriptions  Medication Sig Dispense Refill  . atorvastatin (LIPITOR) 10 MG tablet Take 10 mg by mouth daily.      . Cholecalciferol (VITAMIN D-3 PO) Take 1 capsule by mouth daily.      Marland Kitchen guaiFENesin (MUCINEX) 600 MG 12 hr tablet Take 400 mg by mouth 2 (two) times daily.      . Melatonin 3 MG CAPS Take by mouth.      . metoprolol tartrate (LOPRESSOR) 25 MG tablet Take 1 tablet (25 mg total) by mouth 2 (two) times daily.      . Multiple Vitamin (MULITIVITAMIN WITH  MINERALS) TABS Take 1 tablet by mouth daily.      Marland Kitchen atorvastatin (LIPITOR) 10 MG tablet Take 1 tablet (10 mg total) by mouth daily.  30 tablet  1  . furosemide (LASIX) 40 MG tablet Take 1 tablet (40 mg total) by mouth daily. For 5 days then stop.  5 tablet  0  . iron polysaccharides (NIFEREX) 150 MG capsule Take 1 capsule (150 mg total) by mouth daily. For one month then stop.  30 capsule  0  . metoprolol tartrate (LOPRESSOR) 25 MG tablet Take 1 tablet (25 mg total) by mouth 2 (two) times daily.  60 tablet  1  . potassium chloride SA (K-DUR,KLOR-CON) 20 MEQ tablet Take 1 tablet (20 mEq total) by mouth daily. For 5 days then stop.  5 tablet      Physical Exam:  Gen: no apparent distress Lungs: CTA bilaterally, diminished left base Heart: RRR, sternum stable Abd: soft non-tender, non-distended Skin: incisions healing well, with no acute evidence of infectious process  Diagnostic Tests:  CXR: no evidence of pneumothorax, bilateral atelectasis, small left pleural effusion  Impression:  Ms. [name] [name] is a 61 yo white female S/P CABG x2.  I feel that she is medically stable at this time.  I feel that  her fatigue is most likely related to inactivity, and I stressed the importance of doing as much walking as she can throughout the day.   Plan:  1. Refilled Metoprolol 25mg  BID and Lipitor 10mg  QD 2. Follow up with Cardiology  3. RTC in 4 weeks with Dr. Morton Peters, will repeat CXR at that time to ensure pleural effusion has no worsened

## 2011-06-28 ENCOUNTER — Encounter: Payer: Self-pay | Admitting: Cardiothoracic Surgery

## 2011-07-19 ENCOUNTER — Encounter: Payer: Self-pay | Admitting: Cardiothoracic Surgery

## 2011-07-19 ENCOUNTER — Other Ambulatory Visit: Payer: Self-pay | Admitting: Cardiothoracic Surgery

## 2011-07-19 DIAGNOSIS — I2510 Atherosclerotic heart disease of native coronary artery without angina pectoris: Secondary | ICD-10-CM

## 2011-07-26 ENCOUNTER — Encounter: Payer: Self-pay | Admitting: Cardiothoracic Surgery

## 2011-07-26 ENCOUNTER — Ambulatory Visit
Admission: RE | Admit: 2011-07-26 | Discharge: 2011-07-26 | Disposition: A | Discharge status: Home / Self Care | Payer: No Typology Code available for payment source | Source: Ambulatory Visit | Attending: Cardiothoracic Surgery | Admitting: Cardiothoracic Surgery

## 2011-07-26 ENCOUNTER — Ambulatory Visit (INDEPENDENT_AMBULATORY_CARE_PROVIDER_SITE_OTHER): Payer: Self-pay | Admitting: Cardiothoracic Surgery

## 2011-07-26 VITALS — BP 159/77 | HR 62 | Resp 18 | Ht 64.0 in | Wt 162.0 lb

## 2011-07-26 DIAGNOSIS — Z951 Presence of aortocoronary bypass graft: Secondary | ICD-10-CM

## 2011-07-26 DIAGNOSIS — I2510 Atherosclerotic heart disease of native coronary artery without angina pectoris: Secondary | ICD-10-CM

## 2011-07-26 DIAGNOSIS — J90 Pleural effusion, not elsewhere classified: Secondary | ICD-10-CM

## 2011-07-26 NOTE — Progress Notes (Signed)
PCP is No primary provider on file. Referring Provider is Marykay Lex, MD  Chief Complaint  Patient presents with  . Routine Post Op    4 week f/u with CXR, S/P CABG on 05/26/11    HPI: CABG x2 05/26/2011 doing well with some postoperative depression. Incision is healed pleural effusions resolved no recurrent angina, still feels weak.   Past Medical History  Diagnosis Date  . Hypertension   . IBS (irritable bowel syndrome)   . Anxiety   . Depression   . Complication of anesthesia     difficult waking up "  . Chronic kidney disease     cyst left kidney    Past Surgical History  Procedure Date  . Abdominal hysterectomy   . Tonsillectomy   . Tubal ligation   . Coronary artery bypass graft 05/26/2011    Procedure: CORONARY ARTERY BYPASS GRAFTING (CABG);  Surgeon: Kerin Perna, MD;  Location: [facility] OR;  Service: Open Heart Surgery;  Laterality: N/A;  Coronary Artery Bypass Graft on Pump times two utlizing left internal mammary artery and right greater saphenous vein harvested endoscopically     No family history on file.  Social History History  Substance Use Topics  . Smoking status: Former Smoker    Quit date: 05/23/2006  . Smokeless tobacco: Never Used  . Alcohol Use: No    Current Outpatient Prescriptions  Medication Sig Dispense Refill  . albuterol (PROVENTIL HFA;VENTOLIN HFA) 108 (90 BASE) MCG/ACT inhaler Inhale 2 puffs into the lungs every 6 (six) hours as needed.      Marland Kitchen aspirin 325 MG EC tablet Take 325 mg by mouth every other day.      Marland Kitchen atorvastatin (LIPITOR) 10 MG tablet Take 10 mg by mouth daily.      . Cholecalciferol (VITAMIN D-3 PO) Take 1 capsule by mouth daily.      Marland Kitchen guaiFENesin (MUCINEX) 600 MG 12 hr tablet Take 400 mg by mouth 2 (two) times daily.      . Melatonin 3 MG CAPS Take by mouth.      . metoprolol tartrate (LOPRESSOR) 25 MG tablet Take 1 tablet (25 mg total) by mouth 2 (two) times daily.      . Multiple Vitamin (MULITIVITAMIN WITH  MINERALS) TABS Take 1 tablet by mouth daily.      Marland Kitchen DISCONTD: atorvastatin (LIPITOR) 10 MG tablet Take 1 tablet (10 mg total) by mouth daily.  30 tablet  1  . DISCONTD: metoprolol tartrate (LOPRESSOR) 25 MG tablet Take 1 tablet (25 mg total) by mouth 2 (two) times daily.  60 tablet  1    Allergies  Allergen Reactions  . Levofloxacin Nausea And Vomiting  . Lisinopril Other (See Comments)    Excessive mucus production  . Latex Rash    Review of Systems No  fever better energy improved sleeping still having some crying spells BP 159/77  Pulse 62  Resp 18  Ht 5\' 4"  (1.626 m)  Wt 162 lb (73.483 kg)  BMI 27.81 kg/m2  SpO2 95% Physical Exam Normal sinus rhythm, lungs clear, incision is healed, no edema  Diagnostic Tests: Chest x-ray shows resolution pleural effusions lungs clear sternal wires intact  Impression: Stable course 6 weeks following CABG x2  Plan: Return when necessary, lifting restriction to 20 pounds maximum until August 1.

## 2011-09-27 ENCOUNTER — Encounter (HOSPITAL_COMMUNITY): Payer: Self-pay | Admitting: *Deleted

## 2011-09-27 ENCOUNTER — Emergency Department (HOSPITAL_COMMUNITY)
Admission: EM | Admit: 2011-09-27 | Discharge: 2011-09-27 | Disposition: A | Discharge status: Home / Self Care | Payer: Medicaid Other | Attending: Emergency Medicine | Admitting: Emergency Medicine

## 2011-09-27 ENCOUNTER — Emergency Department (HOSPITAL_COMMUNITY): Payer: Medicaid Other

## 2011-09-27 DIAGNOSIS — F3289 Other specified depressive episodes: Secondary | ICD-10-CM | POA: Insufficient documentation

## 2011-09-27 DIAGNOSIS — F411 Generalized anxiety disorder: Secondary | ICD-10-CM | POA: Insufficient documentation

## 2011-09-27 DIAGNOSIS — R42 Dizziness and giddiness: Secondary | ICD-10-CM | POA: Insufficient documentation

## 2011-09-27 DIAGNOSIS — I10 Essential (primary) hypertension: Secondary | ICD-10-CM | POA: Insufficient documentation

## 2011-09-27 DIAGNOSIS — F329 Major depressive disorder, single episode, unspecified: Secondary | ICD-10-CM | POA: Insufficient documentation

## 2011-09-27 DIAGNOSIS — Z79899 Other long term (current) drug therapy: Secondary | ICD-10-CM | POA: Insufficient documentation

## 2011-09-27 DIAGNOSIS — R110 Nausea: Secondary | ICD-10-CM | POA: Insufficient documentation

## 2011-09-27 DIAGNOSIS — Z951 Presence of aortocoronary bypass graft: Secondary | ICD-10-CM | POA: Insufficient documentation

## 2011-09-27 LAB — BASIC METABOLIC PANEL
BUN: 15 mg/dL (ref 6–23)
CO2: 27 mEq/L (ref 19–32)
Calcium: 9.2 mg/dL (ref 8.4–10.5)
Chloride: 106 mEq/L (ref 96–112)
Creatinine, Ser: 0.65 mg/dL (ref 0.50–1.10)
GFR calc Af Amer: >90 mL/min (ref >90)
GFR calc non Af Amer: >90 mL/min (ref >90)
Glucose, Bld: 139 mg/dL — ABNORMAL HIGH (ref 70–99)
Potassium: 3.8 mEq/L (ref 3.5–5.1)
Sodium: 142 mEq/L (ref 135–145)

## 2011-09-27 LAB — CBC WITH DIFFERENTIAL/PLATELET
Basophils Absolute: 0 10*3/uL (ref 0.0–0.1)
Basophils Relative: 0 % (ref 0–1)
Eosinophils Absolute: 0.2 10*3/uL (ref 0.0–0.7)
Eosinophils Relative: 2 % (ref 0–5)
HCT: 38.2 % (ref 36.0–46.0)
Hemoglobin: 12.5 g/dL (ref 12.0–15.0)
Lymphocytes Relative: 31 % (ref 12–46)
Lymphs Abs: 2.3 10*3/uL (ref 0.7–4.0)
MCH: 25.5 pg — ABNORMAL LOW (ref 26.0–34.0)
MCHC: 32.7 g/dL (ref 30.0–36.0)
MCV: 77.8 fL — ABNORMAL LOW (ref 78.0–100.0)
Monocytes Absolute: 0.5 10*3/uL (ref 0.1–1.0)
Monocytes Relative: 6 % (ref 3–12)
Neutro Abs: 4.4 10*3/uL (ref 1.7–7.7)
Neutrophils Relative %: 60 % (ref 43–77)
Platelets: 339 10*3/uL (ref 150–400)
RBC: 4.91 MIL/uL (ref 3.87–5.11)
RDW: 17 % — ABNORMAL HIGH (ref 11.5–15.5)
WBC: 7.4 10*3/uL (ref 4.0–10.5)

## 2011-09-27 LAB — GLUCOSE, CAPILLARY: Glucose-Capillary: 115 mg/dL — ABNORMAL HIGH (ref 70–99)

## 2011-09-27 MED ORDER — MECLIZINE HCL 25 MG PO TABS: 25.0000 mg | ORAL_TABLET | Freq: Three times a day (TID) | ORAL | Status: AC | PRN

## 2011-09-27 NOTE — ED Notes (Signed)
Today got dizziness and nauseated and vomited x 2.

## 2011-09-27 NOTE — ED Provider Notes (Signed)
History     CSN: [id number]  Arrival date & time 09/27/11  4098   First MD Initiated Contact with Patient 09/27/11 1912      Chief Complaint  Patient presents with  . Dizziness  . Nausea    (Consider location/radiation/quality/duration/timing/severity/associated sxs/prior treatment) HPI Pt reports earlier today she felt a little dizzy while driving home from [employer]. She took a nap and when she woke up she felt a severe room spinning dizziness associated with nausea and two episodes of emesis. States her BP was elevated at the time, so she called EMS. She is feeling much better now. Able to sit up and lie down without dizziness. Denies any CP or SOB.  Past Medical History  Diagnosis Date  . Hypertension   . IBS (irritable bowel syndrome)   . Anxiety   . Depression   . Complication of anesthesia     difficult waking up "  . Chronic kidney disease     cyst left kidney    Past Surgical History  Procedure Date  . Abdominal hysterectomy   . Tonsillectomy   . Tubal ligation   . Coronary artery bypass graft 05/26/2011    Procedure: CORONARY ARTERY BYPASS GRAFTING (CABG);  Surgeon: Kerin Perna, MD;  Location: [facility] OR;  Service: Open Heart Surgery;  Laterality: N/A;  Coronary Artery Bypass Graft on Pump times two utlizing left internal mammary artery and right greater saphenous vein harvested endoscopically     History reviewed. No pertinent family history.  History  Substance Use Topics  . Smoking status: Former Smoker    Quit date: 05/23/2006  . Smokeless tobacco: Never Used  . Alcohol Use: No    OB History    Grav Para Term Preterm Abortions TAB SAB Ect Mult Living                  Review of Systems All other systems reviewed and are negative except as noted in HPI.   Allergies  Levofloxacin; Lisinopril; and Latex  Home Medications   Current Outpatient Rx  Name Route Sig Dispense Refill  . ALBUTEROL SULFATE HFA 108 (90 BASE) MCG/ACT IN AERS Inhalation  Inhale 2 puffs into the lungs every 6 (six) hours as needed. For shortness of breath    . ASPIRIN 325 MG PO TBEC Oral Take 325 mg by mouth daily.     Marland Kitchen VITAMIN D-3 PO Oral Take 1 capsule by mouth daily.    Marland Kitchen METOPROLOL TARTRATE 25 MG PO TABS Oral Take 25 mg by mouth 2 (two) times daily.    . ADULT MULTIVITAMIN W/MINERALS CH Oral Take 1 tablet by mouth daily.    Marland Kitchen OMEPRAZOLE 20 MG PO CPDR Oral Take 20 mg by mouth daily.    Marland Kitchen PRAVASTATIN SODIUM 20 MG PO TABS Oral Take 20 mg by mouth daily.      BP 129/61  Pulse 60  Temp 97.7 F (36.5 C) (Oral)  Resp 22  SpO2 99%  Physical Exam  Nursing note and vitals reviewed. Constitutional: She is oriented to person, place, and time. She appears well-developed and well-nourished.  HENT:  Head: Normocephalic and atraumatic.  Eyes: EOM are normal. Pupils are equal, round, and reactive to light.       No nytagmus  Neck: Normal range of motion. Neck supple.  Cardiovascular: Normal rate, normal heart sounds and intact distal pulses.   Pulmonary/Chest: Effort normal and breath sounds normal.  Abdominal: Bowel sounds are normal. She exhibits no  distension. There is no tenderness.  Musculoskeletal: Normal range of motion. She exhibits no edema and no tenderness.  Neurological: She is alert and oriented to person, place, and time. She has normal strength. No cranial nerve deficit or sensory deficit.  Skin: Skin is warm and dry. No rash noted.  Psychiatric: She has a normal mood and affect.    ED Course  Procedures (including critical care time)  Labs Reviewed  GLUCOSE, CAPILLARY - Abnormal; Notable for the following:    Glucose-Capillary 115 (*)     All other components within normal limits  CBC WITH DIFFERENTIAL - Abnormal; Notable for the following:    MCV 77.8 (*)     MCH 25.5 (*)     RDW 17.0 (*)     All other components within normal limits  BASIC METABOLIC PANEL - Abnormal; Notable for the following:    Glucose, Bld 139 (*)     All other  components within normal limits   Ct Head Wo Contrast  09/27/2011  *RADIOLOGY REPORT*  Clinical Data: Dizziness and nausea.  CT HEAD WITHOUT CONTRAST  Technique:  Contiguous axial images were obtained from the base of the skull through the vertex without contrast.  Comparison: None.  Findings: There is no evidence of acute abnormality including infarction, hemorrhage, mass lesion, mass effect, midline shift or abnormal extra-axial fluid collection.  No hydrocephalus or pneumocephalus.  Calvarium intact.  IMPRESSION: Negative exam.  Original Report Authenticated By: Bernadene Bell. Maricela Curet, M.D.     No diagnosis found.    MDM   Date: 09/27/2011  Rate: 52  Rhythm: normal sinus rhythm  QRS Axis: normal  Intervals: normal  ST/T Wave abnormalities: nonspecific T wave changes  Conduction Disutrbances:none  Narrative Interpretation:   Old EKG Reviewed: unchanged  Labs and imaging as above unremarkable. Pt with vertiginous dizziness which has resolved. Ambulatory without difficulty. Will d/c with PCP followup, Meclizine as needed for symptom return.           Charles B. Bernette Mayers, MD 09/27/11 2106

## 2011-11-13 DIAGNOSIS — Z0271 Encounter for disability determination: Secondary | ICD-10-CM

## 2012-10-28 ENCOUNTER — Other Ambulatory Visit: Payer: Self-pay | Admitting: *Deleted

## 2012-10-28 MED ORDER — ATORVASTATIN CALCIUM 10 MG PO TABS: 10.0000 mg | ORAL_TABLET | Freq: Every day | ORAL | Status: DC

## 2012-10-28 NOTE — Telephone Encounter (Signed)
Rx was sent to pharmacy electronically. 

## 2012-11-20 ENCOUNTER — Encounter: Payer: Self-pay | Admitting: Physician Assistant

## 2012-11-20 ENCOUNTER — Ambulatory Visit (INDEPENDENT_AMBULATORY_CARE_PROVIDER_SITE_OTHER): Payer: BC Managed Care – PPO | Admitting: Physician Assistant

## 2012-11-20 VITALS — BP 130/70 | HR 60 | Ht 64.0 in | Wt 160.0 lb

## 2012-11-20 DIAGNOSIS — R0989 Other specified symptoms and signs involving the circulatory and respiratory systems: Secondary | ICD-10-CM

## 2012-11-20 DIAGNOSIS — R079 Chest pain, unspecified: Secondary | ICD-10-CM

## 2012-11-20 DIAGNOSIS — R0600 Dyspnea, unspecified: Secondary | ICD-10-CM

## 2012-11-20 DIAGNOSIS — R0609 Other forms of dyspnea: Secondary | ICD-10-CM

## 2012-11-20 NOTE — Progress Notes (Signed)
Date:  11/20/2012   ID:  [name] [name], DOB Jul 29, 1950, MRN [id number]  PCP:  Aida Puffer, MD  Primary Cardiologist:  Bishop Limbo    History of Present Illness: [name] [name] is a 62 y.o. female  who underwent cardiac catheterization on May 22, 2011, and was found to have 70% distal left main stenosis, 60% ostial circumflex stenosis, and mild RCA disease. She underwent CABG surgery x2 by Dr. Donata Clay and had a LIMA placed to her LAD and a vein graft to the obtuse marginal 2 vessel. At that time, she did have an ejection fraction in the 50% to 55% range.  Her history also includes HTN, anxiety, depression, CKD, IBS.   She presents after developing N/V, SOB, heavy legs, cough and chest fullness when going up a set of steps last Saturday morning.  She states she could barely get up the steps, had to rest and also noted abd swelling/bloating.   The symptoms resolved however, she had had a prior, much less severe episode before.  She denies  fever, orthopnea, dizziness, PND, cough, congestion, abdominal pain, hematochezia, melena, lower extremity edema, claudication.  Wt Readings from Last 3 Encounters:  11/20/12 160 lb (72.576 kg)  07/26/11 162 lb (73.483 kg)  06/19/11 160 lb (72.576 kg)     Past Medical History  Diagnosis Date  . Hypertension   . IBS (irritable bowel syndrome)   . Anxiety   . Depression   . Complication of anesthesia     difficult waking up "  . Chronic kidney disease     cyst left kidney    Current Outpatient Prescriptions  Medication Sig Dispense Refill  . aspirin 325 MG EC tablet Take 325 mg by mouth daily.       Marland Kitchen atorvastatin (LIPITOR) 10 MG tablet Take 1 tablet (10 mg total) by mouth daily.  30 tablet  1  . carvedilol (COREG) 3.125 MG tablet Take 3.125 mg by mouth 2 (two) times daily with a meal.      . estradiol (ESTRACE) 0.1 MG/GM vaginal cream Place 2 g vaginally. 2 times a month      . lisinopril (PRINIVIL,ZESTRIL) 2.5 MG tablet Take 2.5 mg by mouth daily.       . pantoprazole (PROTONIX) 40 MG tablet Take 40 mg by mouth daily.      Marland Kitchen albuterol (PROVENTIL HFA;VENTOLIN HFA) 108 (90 BASE) MCG/ACT inhaler Inhale 2 puffs into the lungs every 6 (six) hours as needed. For shortness of breath      . Multiple Vitamin (MULITIVITAMIN WITH MINERALS) TABS Take 1 tablet by mouth daily.      . pravastatin (PRAVACHOL) 20 MG tablet Take 20 mg by mouth daily.       No current facility-administered medications for this visit.    Allergies:    Allergies  Allergen Reactions  . Levofloxacin Nausea And Vomiting  . Lisinopril Other (See Comments)    Excessive mucus production  . Latex Rash    Social History:  The patient  reports that she quit smoking about 6 years ago. She has never used smokeless tobacco. She reports that she does not drink alcohol or use illicit drugs.   Family history:  History reviewed. No pertinent family history.  ROS:  Please see the history of present illness.  All other systems reviewed and negative.   PHYSICAL EXAM: VS:  BP 130/70  Pulse 60  Ht 5\' 4"  (1.626 m)  Wt 160 lb (72.576 kg)  BMI 27.45  kg/m2 Well nourished, well developed, in no acute distress HEENT: Pupils are equal round react to light accommodation extraocular movements are intact.  Neck: no JVDNo cervical lymphadenopathy. Cardiac: Regular rate and rhythm without murmurs rubs or gallops. Lungs:  clear to auscultation bilaterally, no wheezing, rhonchi or rales Abd: soft, nontender, positive bowel sounds all quadrants, no hepatosplenomegaly Ext: no lower extremity edema.  2+ radial and dorsalis pedis pulses. Skin: warm and dry Neuro:  Grossly normal  EKG:  NSR TWI in leads V1, 2 seen on previous EKG  ASSESSMENT AND PLAN:  Problem List Items Addressed This Visit   Dyspnea - Primary   Relevant Orders      CBC      Basic Metabolic Panel (BMET)      Lipid Profile      Myocardial Perfusion Imaging      EKG 12-Lead   Chest pain     The patient is 18 months SP  CABG.  She was last seen Nov 14, 2011.  She presents with severe SOB, Chest tightness, N, V and leg heaviness after trying to climb a set of stairs.  I will order a treadmill myoview and she will follow up after the test.                Additionally, she has been diagnosed with prediabetes.  She is also overweight and is interested in a nutritional consult.  I will make a referral for her.

## 2012-11-20 NOTE — Assessment & Plan Note (Signed)
The patient is 18 months SP CABG.  She was last seen Nov 14, 2011.  She presents with severe SOB, Chest tightness, N, V and leg heaviness after trying to climb a set of stairs.  I will order a treadmill myoview and she will follow up after the test.

## 2012-11-20 NOTE — Patient Instructions (Signed)
1. We will schedule you for a treadmill stress test. 2. Labs:  CBC, BMET, cholesterol

## 2012-11-21 ENCOUNTER — Telehealth (HOSPITAL_COMMUNITY): Payer: Self-pay | Admitting: *Deleted

## 2012-11-21 NOTE — Telephone Encounter (Signed)
FORWARD TO BRYAN HAGER

## 2012-11-21 NOTE — Telephone Encounter (Signed)
I spoke to Ms. [name] [name] on the telephone.  She is concerned that she will have a heart attack during the stress test.  She said she would rather wait and loose weight and "get in shape" first.  I recommended she take the test, which is supervised and that getting in shape first is not a good idea.  Her phone disconnected and I could not reach her back.   Ithiel Liebler 4:37 PM

## 2012-11-21 NOTE — Telephone Encounter (Signed)
I called to schedule the patients treadmill stress test that was ordered by Judie Grieve, but the patient would like to speak with Judie Grieve first because she feels a little nervous about it. Please call

## 2012-11-22 ENCOUNTER — Ambulatory Visit: Payer: Medicaid Other | Admitting: Cardiology

## 2012-11-22 LAB — CBC
HCT: 41.1 % (ref 36.0–46.0)
Hemoglobin: 13.9 g/dL (ref 12.0–15.0)
MCH: 27.9 pg (ref 26.0–34.0)
MCHC: 33.8 g/dL (ref 30.0–36.0)
MCV: 82.4 fL (ref 78.0–100.0)
Platelets: 368 10*3/uL (ref 150–400)
RBC: 4.99 MIL/uL (ref 3.87–5.11)
RDW: 14.4 % (ref 11.5–15.5)
WBC: 6 10*3/uL (ref 4.0–10.5)

## 2012-11-23 LAB — BASIC METABOLIC PANEL
BUN: 15 mg/dL (ref 6–23)
CO2: 27 mEq/L (ref 19–32)
Calcium: 9 mg/dL (ref 8.4–10.5)
Chloride: 107 mEq/L (ref 96–112)
Creat: 0.75 mg/dL (ref 0.50–1.10)
Glucose, Bld: 89 mg/dL (ref 70–99)
Potassium: 4.4 mEq/L (ref 3.5–5.3)
Sodium: 137 mEq/L (ref 135–145)

## 2012-11-23 LAB — LIPID PANEL
Cholesterol: 127 mg/dL (ref 0–200)
HDL: 56 mg/dL (ref >39)
LDL Cholesterol: 60 mg/dL (ref 0–99)
Total CHOL/HDL Ratio: 2.3 Ratio
Triglycerides: 55 mg/dL (ref <150)
VLDL: 11 mg/dL (ref 0–40)

## 2012-11-26 ENCOUNTER — Other Ambulatory Visit: Payer: Self-pay

## 2012-11-26 DIAGNOSIS — Z1231 Encounter for screening mammogram for malignant neoplasm of breast: Secondary | ICD-10-CM

## 2012-11-27 ENCOUNTER — Ambulatory Visit: Payer: Medicaid Other | Admitting: Cardiovascular Disease

## 2012-11-27 ENCOUNTER — Telehealth: Payer: Self-pay | Admitting: Cardiovascular Disease

## 2012-11-27 NOTE — Telephone Encounter (Signed)
Wants Lab  Result from 10-10-4 please.

## 2012-11-27 NOTE — Telephone Encounter (Signed)
Returned call and spoke w/ pt.  Informed results have not been reviewed by MD/PA and nurse will call, mail letter or release in MyChart after they are reviewed.  Pt verbalized understanding and agreed w/ plan.  Pt does not still have MyChart code and informed letter will be mailed.  Pt verbalized understanding and agreed w/ plan.  Letter mailed.

## 2012-11-28 ENCOUNTER — Telehealth: Payer: Self-pay | Admitting: *Deleted

## 2012-11-28 ENCOUNTER — Ambulatory Visit (HOSPITAL_COMMUNITY)
Admission: RE | Admit: 2012-11-28 | Discharge: 2012-11-28 | Disposition: A | Discharge status: Home / Self Care | Payer: BC Managed Care – PPO | Source: Ambulatory Visit | Attending: Internal Medicine | Admitting: Internal Medicine

## 2012-11-28 DIAGNOSIS — R0609 Other forms of dyspnea: Secondary | ICD-10-CM | POA: Insufficient documentation

## 2012-11-28 DIAGNOSIS — R0989 Other specified symptoms and signs involving the circulatory and respiratory systems: Secondary | ICD-10-CM | POA: Insufficient documentation

## 2012-11-28 DIAGNOSIS — R0600 Dyspnea, unspecified: Secondary | ICD-10-CM

## 2012-11-28 MED ORDER — TECHNETIUM TC 99M SESTAMIBI GENERIC - CARDIOLITE
30.0000 | Freq: Once | INTRAVENOUS | Status: AC | PRN
  Administered 2012-11-28: 30 via INTRAVENOUS

## 2012-11-28 MED ORDER — TECHNETIUM TC 99M SESTAMIBI GENERIC - CARDIOLITE
10.0000 | Freq: Once | INTRAVENOUS | Status: AC | PRN
  Administered 2012-11-28: 10 via INTRAVENOUS

## 2012-11-28 NOTE — Procedures (Addendum)
[facility] [address] [place] [address] [phone]  Cardiology Nuclear Med Study  [name] [name] is a 62 y.o. female     MRN : [id number]     DOB: 04/19/1950  Procedure Date: 11/28/2012  Nuclear Med Background Indication for Stress Test:  Graft Patency and Abnormal EKG History:  CAD;CABG X2--05/26/2011 Cardiac Risk Factors: History of Smoking, Hypertension, Lipids and Overweight  Symptoms:  Chest Pain, Dizziness, DOE, Fatigue, Light-Headedness, Nausea, SOB, Vomiting and Heaviness in legs reported.   Nuclear Pre-Procedure Caffeine/Decaff Intake:  7:00pm NPO After: 5:00am   IV Site: R Hand  IV 0.9% NS with Angio Cath:  22g  Chest Size (in):  n/a IV Started by: Emmit Pomfret, RN  Height: 5\' 4"  (1.626 m)  Cup Size: D  BMI:  Body mass index is 27.45 kg/(m^2). Weight:  160 lb (72.576 kg)   Tech Comments:  n/a    Nuclear Med Study 1 or 2 day study: 1 day  Stress Test Type:  Stress  Order Authorizing Provider:  Benny Lennert   Resting Radionuclide: Technetium 34m Sestamibi  Resting Radionuclide Dose: 10.5 mCi   Stress Radionuclide:  Technetium 8m Sestamibi  Stress Radionuclide Dose: 31.0 mCi           Stress Protocol Rest HR: 56 Stress HR: 148  Rest BP: 125/85 Stress BP: 189/85  Exercise Time (min): 8:40 METS: 9.20   Predicted Max HR: 159 bpm % Max HR: 93.08 bpm Rate Pressure Product: 21308  Dose of Adenosine (mg):  n/a Dose of Lexiscan: n/a mg  Dose of Atropine (mg): n/a Dose of Dobutamine: n/a mcg/kg/min (at max HR)  Stress Test Technologist: Ernestene Mention, CCT Nuclear Technologist: Koren Shiver, CNMT   Rest Procedure:  Myocardial perfusion imaging was performed at rest 45 minutes following the intravenous administration of Technetium 14m Sestamibi. Stress Procedure:  The patient performed treadmill exercise using a Bruce  Protocol for 8 minutes and 40 seconds. The patient stopped due to shortness of  breath and fatigue. Patient denied any chest pain.  There were no significant ST-T wave changes.  Technetium 5m Sestamibi was injected at peak exercise and myocardial perfusion imaging was performed after a brief delay.  Transient Ischemic Dilatation (Normal <1.22):  0.73 Lung/Heart Ratio (Normal <0.45):  0.33 QGS EDV:  62 ml QGS ESV:  10 ml LV Ejection Fraction: 83%  Rest ECG: NSR - Normal EKG  Stress ECG: No significant change from baseline ECG  QPS Raw Data Images:  There is interference from nuclear activity from structures below the diaphragm. This does not affect the ability to read the study. Stress Images:  Normal homogeneous uptake in all areas of the myocardium. Rest Images:  Normal homogeneous uptake in all areas of the myocardium. Subtraction (SDS):  No evidence of ischemia.  Impression Exercise Capacity:  Good exercise capacity. BP Response:  Hypertensive blood pressure response. Clinical Symptoms:  There is dyspnea. ECG Impression:  No significant ST segment change suggestive of ischemia. Comparison with Prior Nuclear Study: No previous nuclear study performed  Overall Impression:  Normal stress nuclear study.  LV Wall Motion:  NL LV Function; NL Wall Motion; EF 83%.  Chrystie Nose, MD, [facility] Board Certified in Nuclear Cardiology Attending Cardiologist [facility]  Chrystie Nose, MD  11/28/2012 1:05 PM

## 2012-11-28 NOTE — Telephone Encounter (Signed)
Pt left walk-in message that she wants to discuss her lab results.    Returned call and pt verified x 2.  Pt informed RN spoke w/ her yesterday and informed her results had not been reviewed by provider and she will be notified when they have been reviewed.  Pt recalled conversation yesterday and understands she has a preliminary result as provider has not reviewed to advise on plan of care.  Pt informed Anner Crete will be notified.  Pt verbalized understanding and agreed w/ plan.  Message forwarded to B. Hager, PA-C.

## 2012-11-29 NOTE — Telephone Encounter (Signed)
I discussed the  Lab and myoview results with the patient.  [name] [name] 10:02 AM

## 2012-12-10 ENCOUNTER — Telehealth: Payer: Self-pay | Admitting: Cardiovascular Disease

## 2012-12-10 NOTE — Telephone Encounter (Signed)
Wants to know since Judie Grieve told her Stress Test was ok,does she still need to come in to see Dr Tresa Endo on Thursday? Please call and let her know,if not there please leave a message.

## 2012-12-11 NOTE — Progress Notes (Signed)
Quick Note:  Patient states these results were given to her by Wilburt Finlay. She called and cancelled follow up appointment with Dr. Tresa Endo to discuss. ______

## 2012-12-11 NOTE — Telephone Encounter (Signed)
Returned call and pt verified x 2.  Pt informed it is up to her if she wants to cancel the appt.  Informed per Burna Mortimer, Dr. Tresa Endo usually does a f/u visit after any tests are done.  Pt informed if canceled and symptoms return or change, then she will need to return for an appt for evaluation.  Pt verbalized understanding and agreed w/ plan.  Pt asked that appt be canceled.  Stated she thinks her SOB was r/t her allergies flaring up.  Stated she tolerated the test well and her labs were good.  Pt informed appt will be canceled.

## 2012-12-12 ENCOUNTER — Ambulatory Visit: Payer: BC Managed Care – PPO | Admitting: Cardiovascular Disease

## 2012-12-16 ENCOUNTER — Telehealth: Payer: Self-pay | Admitting: *Deleted

## 2012-12-16 NOTE — Telephone Encounter (Signed)
Spoke with patient informing her that her stress test per Dr. Tresa Endo is WNL.Marland Kitchen

## 2012-12-16 NOTE — Telephone Encounter (Signed)
Message copied by Gaynelle Cage on Mon Dec 16, 2012  2:04 PM ------      Message from: Nicki Guadalajara A      Created: Wed Dec 11, 2012  6:39 PM       Nl nuc ------

## 2012-12-26 ENCOUNTER — Ambulatory Visit
Admission: RE | Admit: 2012-12-26 | Discharge: 2012-12-26 | Disposition: A | Discharge status: Home / Self Care | Payer: BC Managed Care – PPO | Source: Ambulatory Visit

## 2012-12-26 DIAGNOSIS — Z1231 Encounter for screening mammogram for malignant neoplasm of breast: Secondary | ICD-10-CM

## 2013-01-29 ENCOUNTER — Other Ambulatory Visit: Payer: Self-pay | Admitting: Cardiovascular Disease

## 2013-01-29 NOTE — Telephone Encounter (Signed)
Rx was sent to pharmacy electronically. 

## 2013-03-21 ENCOUNTER — Telehealth: Payer: Self-pay | Admitting: Cardiovascular Disease

## 2013-03-21 NOTE — Telephone Encounter (Signed)
Pt wants to know if she can stop her Lisinopril,says her blood pressure is doing better.

## 2013-03-21 NOTE — Telephone Encounter (Signed)
Returned call.  No answer/voicemail.  Will try later.  

## 2013-03-21 NOTE — Telephone Encounter (Signed)
Returned call and pt verified x 2.  Pt wanted to know if she can stop her BP medication.  Stated she thinks her BP gets too low sometimes.  State it was 113/68 HR 57 and today 126/71.  Pt denied having any symptoms: dizziness or vision changes.  Pt stated she just thought she was doing fine and didn't need it anymore.  Pt informed it appears the medication is working and she should continue taking it.  Pt advised to call back if she has any symptoms or if she has a BP 100/60 or lower.  Pt verbalized understanding and agreed w/ plan.

## 2013-03-21 NOTE — Telephone Encounter (Signed)
Pt was returning your call  TK

## 2013-05-01 ENCOUNTER — Encounter (INDEPENDENT_AMBULATORY_CARE_PROVIDER_SITE_OTHER): Payer: 59 | Admitting: Ophthalmology

## 2013-05-01 DIAGNOSIS — E1165 Type 2 diabetes mellitus with hyperglycemia: Secondary | ICD-10-CM

## 2013-05-01 DIAGNOSIS — E11319 Type 2 diabetes mellitus with unspecified diabetic retinopathy without macular edema: Secondary | ICD-10-CM

## 2013-05-01 DIAGNOSIS — H33309 Unspecified retinal break, unspecified eye: Secondary | ICD-10-CM

## 2013-05-01 DIAGNOSIS — E1139 Type 2 diabetes mellitus with other diabetic ophthalmic complication: Secondary | ICD-10-CM

## 2013-05-01 DIAGNOSIS — H43819 Vitreous degeneration, unspecified eye: Secondary | ICD-10-CM

## 2013-05-01 DIAGNOSIS — H35039 Hypertensive retinopathy, unspecified eye: Secondary | ICD-10-CM

## 2013-05-01 DIAGNOSIS — I10 Essential (primary) hypertension: Secondary | ICD-10-CM

## 2013-05-07 ENCOUNTER — Other Ambulatory Visit: Payer: Self-pay

## 2013-05-07 MED ORDER — LISINOPRIL 2.5 MG PO TABS
2.5000 mg | ORAL_TABLET | Freq: Every day | ORAL | Status: DC
Start: 2013-05-07 — End: 2013-10-10

## 2013-05-07 MED ORDER — ATORVASTATIN CALCIUM 10 MG PO TABS: 10.0000 mg | ORAL_TABLET | Freq: Every day | ORAL | Status: DC

## 2013-05-07 MED ORDER — CARVEDILOL 3.125 MG PO TABS: 3.1250 mg | ORAL_TABLET | Freq: Two times a day (BID) | ORAL | Status: DC

## 2013-05-07 NOTE — Telephone Encounter (Signed)
Rx was sent to pharmacy electronically. 

## 2013-05-14 ENCOUNTER — Other Ambulatory Visit (INDEPENDENT_AMBULATORY_CARE_PROVIDER_SITE_OTHER): Payer: 59 | Admitting: Ophthalmology

## 2013-05-14 DIAGNOSIS — H33309 Unspecified retinal break, unspecified eye: Secondary | ICD-10-CM

## 2013-05-28 ENCOUNTER — Ambulatory Visit (INDEPENDENT_AMBULATORY_CARE_PROVIDER_SITE_OTHER): Payer: 59 | Admitting: Ophthalmology

## 2013-05-28 DIAGNOSIS — H33309 Unspecified retinal break, unspecified eye: Secondary | ICD-10-CM

## 2013-06-09 ENCOUNTER — Ambulatory Visit (INDEPENDENT_AMBULATORY_CARE_PROVIDER_SITE_OTHER): Payer: 59 | Admitting: Ophthalmology

## 2013-06-09 DIAGNOSIS — H33309 Unspecified retinal break, unspecified eye: Secondary | ICD-10-CM

## 2013-06-16 ENCOUNTER — Telehealth (HOSPITAL_COMMUNITY): Payer: Self-pay | Admitting: Dietician

## 2013-06-16 NOTE — Telephone Encounter (Addendum)
Called at 1251. She requests holding off on appointment for now due to multiple medical appointments and expenses.

## 2013-06-16 NOTE — Telephone Encounter (Signed)
Received referral from [place] for dx: obesity.

## 2013-06-19 ENCOUNTER — Ambulatory Visit (INDEPENDENT_AMBULATORY_CARE_PROVIDER_SITE_OTHER): Payer: 59 | Admitting: Ophthalmology

## 2013-06-19 DIAGNOSIS — H33309 Unspecified retinal break, unspecified eye: Secondary | ICD-10-CM

## 2013-09-15 ENCOUNTER — Encounter (INDEPENDENT_AMBULATORY_CARE_PROVIDER_SITE_OTHER): Payer: 59 | Admitting: Ophthalmology

## 2013-09-15 DIAGNOSIS — H33309 Unspecified retinal break, unspecified eye: Secondary | ICD-10-CM

## 2013-10-10 ENCOUNTER — Ambulatory Visit (INDEPENDENT_AMBULATORY_CARE_PROVIDER_SITE_OTHER): Payer: 59 | Admitting: Cardiovascular Disease

## 2013-10-10 ENCOUNTER — Encounter: Payer: Self-pay | Admitting: Cardiovascular Disease

## 2013-10-10 VITALS — BP 136/72 | HR 47 | Ht 63.0 in | Wt 162.4 lb

## 2013-10-10 DIAGNOSIS — R001 Bradycardia, unspecified: Secondary | ICD-10-CM

## 2013-10-10 DIAGNOSIS — I498 Other specified cardiac arrhythmias: Secondary | ICD-10-CM

## 2013-10-10 DIAGNOSIS — I2510 Atherosclerotic heart disease of native coronary artery without angina pectoris: Secondary | ICD-10-CM

## 2013-10-10 DIAGNOSIS — Z951 Presence of aortocoronary bypass graft: Secondary | ICD-10-CM

## 2013-10-10 NOTE — Patient Instructions (Addendum)
Your physician has recommended you make the following change in your medication: decrease the carvedilol to 1/2 tablet twice daily for 3 days, then 1/2 tablet daily for 2 days then STOP THE MEDICATION. decrease the aspirin down to 81mg  daily.  Your physician wants you to follow-up in: 1 year You will receive a reminder letter in the mail two months in advance. If you don't receive a letter, please call our office to schedule the follow-up appointment.

## 2013-10-10 NOTE — Progress Notes (Signed)
Patient ID: [name] [name], female   DOB: [birth year], 63 y.o.   MRN: [id number]     HPI: [name] [name] is a 63 y.o. female who presents to the office today for a 2 year follow up cardiology evaluation.  [name] [name] is is a 63 year old female, who underwent cardiac catheterization on 05/22/2011 and was found to have 70% distal left main stenosis, 60% ostial circumflex stenosis and mild RCA disease.  She underwent CABG surgery x2 by Dr. Lucianne Lei trite and had a LIMA placed to her LAD, and vein to the OM 2 vessel.  Ejection fraction was 50-55%.  I last saw her in October 2013, and she was doing remarkably well.  She was seen one year ago by Tenny Craw, and at that time.  She had noticed some vague chest fullness.  She underwent a nuclear perfusion study on 11/28/2012 and this revealed hyperdynamic LV with an EF of 83%.  Post stress.  She did not have ECG changes.  She had normal perfusion and function.  Over the past year, she has continued to do well.  She does not like to take medication.  At times.  She does note fatigue.  She states her weight has fluctuated from 160-165.  She presents for evaluation.  She denies chest pain.  She denies PND or orthopnea.  She denies wheezing.  I did review blood work that she had done by Dr. Judd Lien in eating.  On 09/04/2013.  Chemistry profile was normal.  CBC was excellent.  Lipid studies reveal a total cholesterol 155, triglycerides 91, HDL 63, LDL 74.  TSH was 2.4.  She had mild vitamin D insufficiency at 25.8, and was prescribed 2000 units daily.  Hemoglobin A1c was 6.0.  Past Medical History  Diagnosis Date  . Hypertension   . IBS (irritable bowel syndrome)   . Anxiety   . Depression   . Complication of anesthesia     difficult waking up "  . Chronic kidney disease     cyst left kidney    Past Surgical History  Procedure Laterality Date  . Abdominal hysterectomy    . Tonsillectomy    . Tubal ligation    . Coronary artery bypass graft  05/26/2011   Procedure: CORONARY ARTERY BYPASS GRAFTING (CABG);  Surgeon: Ivin Poot, MD;  Location: [facility];  Service: Open Heart Surgery;  Laterality: N/A;  Coronary Artery Bypass Graft on Pump times two utlizing left internal mammary artery and right greater saphenous vein harvested endoscopically     Allergies  Allergen Reactions  . Levofloxacin Nausea And Vomiting  . Lisinopril Other (See Comments)    Excessive mucus production  . Latex Rash    Current Outpatient Prescriptions  Medication Sig Dispense Refill  . lisinopril (PRINIVIL,ZESTRIL) 5 MG tablet Take 5 mg by mouth daily.      . pantoprazole (PROTONIX) 40 MG tablet Take 40 mg by mouth as needed.       Marland Kitchen aspirin 325 MG EC tablet Take 325 mg by mouth daily.       Marland Kitchen atorvastatin (LIPITOR) 10 MG tablet Take 1 tablet (10 mg total) by mouth daily.  30 tablet  7  . carvedilol (COREG) 3.125 MG tablet Take 1 tablet (3.125 mg total) by mouth 2 (two) times daily with a meal.  60 tablet  7  . estradiol (ESTRACE) 0.1 MG/GM vaginal cream Place 2 g vaginally. 2 times a month       No current facility-administered medications  for this visit.    History   Social History  . Marital Status: Divorced    Spouse Name: N/A    Number of Children: N/A  . Years of Education: N/A   Occupational History  . Not on file.   Social History Main Topics  . Smoking status: Former Smoker    Quit date: 05/23/2006  . Smokeless tobacco: Never Used  . Alcohol Use: No  . Drug Use: No  . Sexual Activity: Not Currently    Birth Control/ Protection: Post-menopausal   Other Topics Concern  . Not on file   Social History Narrative  . No narrative on file    History reviewed. No pertinent family history.  ROS General: Negative; No fevers, chills, or night sweats HEENT: Negative; No changes in vision or hearing, sinus congestion, difficulty swallowing Pulmonary: Negative; No cough, wheezing, shortness of breath, hemoptysis Cardiovascular: See HPI: No chest  pain, presyncope, syncope, palpatations GI: Negative; No nausea, vomiting, diarrhea, or abdominal pain GU: Negative; No dysuria, hematuria, or difficulty voiding Musculoskeletal: Negative; no myalgias, joint pain, or weakness Hematologic: Negative; no easy bruising, bleeding Endocrine: Negative; no heat/cold intolerance; no diabetes, Neuro: Negative; no changes in balance, headaches Skin: Negative; No rashes or skin lesions Psychiatric: Negative; No behavioral problems, depression Sleep: Negative; No snoring,  daytime sleepiness, hypersomnolence, bruxism, restless legs, hypnogognic hallucinations. Other comprehensive 14 point system review is negative   Physical Exam BP 136/72  Pulse 47  Ht 5\' 3"  (1.6 m)  Wt 162 lb 6.4 oz (73.664 kg)  BMI 28.78 kg/m2 General: Alert, oriented, no distress.  Skin: normal turgor, no rashes, warm and dry HEENT: Normocephalic, atraumatic. Pupils equal round and reactive to light; sclera anicteric; extraocular muscles intact, No lid lag; Nose without nasal septal hypertrophy; Mouth/Parynx benign; Mallinpatti scale 2 Neck: No JVD, no carotid bruits; normal carotid upstroke Lungs: clear to ausculatation and percussion bilaterally; no wheezing or rales, normal inspiratory and expiratory effort Chest wall: without tenderness to palpitation Heart: PMI not displaced, RRR, s1 s2 normal, 1/6 systolic murmur, No diastolic murmur, no rubs, gallops, thrills, or heaves Abdomen: soft, nontender; no hepatosplenomehaly, BS+; abdominal aorta nontender and not dilated by palpation. Back: no CVA tenderness Pulses: 2+  Musculoskeletal: full range of motion, normal strength, no joint deformities Extremities: Pulses 2+, no clubbing cyanosis or edema, Homan's sign negative  Neurologic: grossly nonfocal; Cranial nerves grossly wnl Psychologic: Normal mood and affect   ECG (independently read by me): Sinus bradycardia at 47 beats per minute.  Nonspecific T changes.  QTc  interval   385 ms  LABS:  BMET    Component Value Date/Time   NA 137 11/20/2012 0857   K 4.4 11/20/2012 0857   CL 107 11/20/2012 0857   CO2 27 11/20/2012 0857   GLUCOSE 89 11/20/2012 0857   BUN 15 11/20/2012 0857   CREATININE 0.75 11/20/2012 0857   CREATININE 0.65 09/27/2011 1936   CALCIUM 9.0 11/20/2012 0857   GFRNONAA >90 09/27/2011 1936   GFRAA >90 09/27/2011 1936     Hepatic Function Panel     Component Value Date/Time   PROT 6.3 05/23/2011 1444   ALBUMIN 3.7 05/23/2011 1444   AST 13 05/23/2011 1444   ALT 10 05/23/2011 1444   ALKPHOS 74 05/23/2011 1444   BILITOT 0.3 05/23/2011 1444     CBC    Component Value Date/Time   WBC 6.0 11/20/2012 0857   RBC 4.99 11/20/2012 0857   RBC 4.61 11/03/2006 0029   HGB  13.9 11/20/2012 0857   HCT 41.1 11/20/2012 0857   PLT 368 11/20/2012 0857   MCV 82.4 11/20/2012 0857   MCH 27.9 11/20/2012 0857   MCHC 33.8 11/20/2012 0857   RDW 14.4 11/20/2012 0857   LYMPHSABS 2.3 09/27/2011 1922   MONOABS 0.5 09/27/2011 1922   EOSABS 0.2 09/27/2011 1922   BASOSABS 0.0 09/27/2011 1922     BNP    Component Value Date/Time   PROBNP 61.5 05/22/2011 1908    Lipid Panel     Component Value Date/Time   CHOL 127 11/20/2012 0857   TRIG 55 11/20/2012 0857   HDL 56 11/20/2012 0857   CHOLHDL 2.3 11/20/2012 0857   VLDL 11 11/20/2012 0857   LDLCALC 60 11/20/2012 0857     RADIOLOGY: No results found.    ASSESSMENT AND PLAN:  [name] [name] is a very pleasant 63 year old female, who is now 2-1/2 years status post CABG revascularization surgery after she was found to have high-grade left main stenosis.  A nuclear study done last year showed normal perfusion and hyperdynamic LV function.  Her blood pressure is stable and on repeat today by me was 130/70.  She does have marked sinus bradycardia.  She is only been taking carvedilol 3.125 twice a day.  I am electing to taper and discontinue this over the next week and she will reduce her carvedilol to one half of a pill twice a day  for 3 days and then one half a pill daily for 2 days with ultimate discontinuance.  She is tolerating lisinopril, and we'll continue this at the present dose of 5 mg daily. I am also reducing her aspirin to 81 mg.  I reviewed her laboratory with her in detail.  I will see her in one year for followup evaluation or sooner if problems arise.    Troy Sine, MD, [facility]  10/10/2013 9:27 AM

## 2013-10-22 ENCOUNTER — Ambulatory Visit (INDEPENDENT_AMBULATORY_CARE_PROVIDER_SITE_OTHER): Payer: 59 | Admitting: Ophthalmology

## 2013-10-22 DIAGNOSIS — H43819 Vitreous degeneration, unspecified eye: Secondary | ICD-10-CM

## 2013-10-22 DIAGNOSIS — H35039 Hypertensive retinopathy, unspecified eye: Secondary | ICD-10-CM

## 2013-10-22 DIAGNOSIS — H33309 Unspecified retinal break, unspecified eye: Secondary | ICD-10-CM

## 2013-10-22 DIAGNOSIS — H2510 Age-related nuclear cataract, unspecified eye: Secondary | ICD-10-CM

## 2013-10-22 DIAGNOSIS — I10 Essential (primary) hypertension: Secondary | ICD-10-CM

## 2013-10-23 ENCOUNTER — Ambulatory Visit (INDEPENDENT_AMBULATORY_CARE_PROVIDER_SITE_OTHER): Payer: 59 | Admitting: Ophthalmology

## 2013-11-17 ENCOUNTER — Telehealth: Payer: Self-pay | Admitting: Cardiovascular Disease

## 2013-11-17 NOTE — Telephone Encounter (Signed)
Pt says she can not take the Amlodipine any more. She had a very bad reaction to it. She wants something else that she will not be allergic to.Please call something in for her to [facility] [phone].Please call her,before you call anything in.

## 2013-11-17 NOTE — Telephone Encounter (Signed)
Spoke with pt, she had to stop the lisinopril due to sinus issues. Her PCP started her on amlodipine and this am she had to go to the ER because her throat was closing up with the allergic reaction she was having. She has not contacted the PCP, she would like dr Claiborne Billings to decide what type of bp med to try next. Her bp averages 140's with spikes up to the 160's. She does not feel comfortable with the decisions her PCP has made and would like dr [facility] input. Aware he is not in the office today but will forward for his review. Pt agreed with this plan.

## 2013-11-20 MED ORDER — LOSARTAN POTASSIUM 50 MG PO TABS: ORAL_TABLET | ORAL | Status: DC

## 2013-11-20 NOTE — Telephone Encounter (Signed)
Patient notified of Dr. Evette Georges recommendations. Losartan 50 mg  prescription sent to [facility].

## 2013-11-20 NOTE — Telephone Encounter (Signed)
Try losartan 25 mg daily for 5 days and if tolerates then 50 mg daily

## 2013-11-21 ENCOUNTER — Ambulatory Visit: Payer: BC Managed Care – PPO | Admitting: Cardiovascular Disease

## 2013-11-24 ENCOUNTER — Telehealth: Payer: Self-pay | Admitting: Cardiovascular Disease

## 2013-11-24 NOTE — Telephone Encounter (Signed)
Spoke with pt, she called to report cont tremor. She reports this started when she took amlodipine. Her bp has been running 135/70 with pulse 70's to upper 80's since she started the losartan. She is to increase the losartan tonight to 50 mg daily. No other complaints at this time, she wants to make sure dr Claiborne Billings is aware. Will forward to dr Claiborne Billings.

## 2013-11-24 NOTE — Telephone Encounter (Signed)
Pt called in wanting to speak to the nurse about her progress while being switched to Losartan. She says that she feels fine other than having the shakes, she is unsure if it is a side effect of the medicine or not. Please call  Thanks

## 2013-11-24 NOTE — Telephone Encounter (Signed)
Left message for patient of dr kelly's recommendations. 

## 2013-11-24 NOTE — Telephone Encounter (Signed)
Aware; doubt it is related to losartan; continue with planned dose increase as long as no rash

## 2013-11-25 ENCOUNTER — Telehealth: Payer: Self-pay | Admitting: Cardiovascular Disease

## 2013-11-25 NOTE — Telephone Encounter (Signed)
Pt called in stating that she doesn't think the the BP medication she was just switched to is working. She says that she is still having really bad headaches and dizzy spells. She said that she took her BP a little while ago and it was 151/70. Please call  Thanks

## 2013-11-25 NOTE — Telephone Encounter (Signed)
Left message for pt to call.

## 2013-11-26 NOTE — Telephone Encounter (Signed)
Returning your call from yesterday. °

## 2013-11-26 NOTE — Telephone Encounter (Signed)
Medication Issues: Lisinopril >> sinus issues/bronchitis Amlodipine by PCP ([facility] Dr. Curlene Labrum) >> allergic rxn (swelling) ED last Thursday r/t amlodipine [facility]) HCTZ by PCP >> not effective per patient.  Dr. Claiborne Billings >> Losartan >> dizziness and dull headache ** did fine on carvedilol but reports HR got too slow ** reports she tolerated losartan 25mg  OK? But is not feeling well with increased dose of 50mg   Patient reports she cannot sleep, she is "so sick and tired of being sick and tired". Reports she is "sensitive to drugs, sensitive to medicine".  Reports low grade fever.  Reports sharp chest pain in left side of chest 1 week ago. During med transition when she changed to losartan.  Reports "anxiety" breathing.   10/9 135/75 HR 66         143/69 HR 87 10/10 132/78 HR 75 10/11 135/63 HR 76           129/73 HR 80 10/13 151/81 HR 68 (lunch time)           148/69 HR 80 (730pm) 10/14 139/84 HR 56 (530am)           133/71 HR 72   Patient would like advice on medication/possible changes and she is not tolerating losartan well. Her BP readings appear fairly stable, HR ranges from mid 50s to 80 bpm.   Will defer to Dr. Claiborne Billings and Mariann Laster to advise

## 2013-11-26 NOTE — Telephone Encounter (Signed)
LEFT MESSAGE TO CALL,

## 2013-11-27 NOTE — Telephone Encounter (Signed)
Pt. Given instructions from Dr. Claiborne Billings

## 2013-11-27 NOTE — Telephone Encounter (Signed)
If she tolerated losartan 25 mg then go back to this dose; if she did not then dc and monitor BP recordings for 1 week

## 2013-11-27 NOTE — Telephone Encounter (Signed)
LMTCB

## 2013-12-02 ENCOUNTER — Telehealth: Payer: Self-pay | Admitting: Cardiovascular Disease

## 2013-12-02 NOTE — Telephone Encounter (Signed)
Would resume BP med at 1/2 the previous dose

## 2013-12-02 NOTE — Telephone Encounter (Signed)
Pt have been off of her blood pressure for 5 days,please call.Pt says she will give you more details.

## 2013-12-02 NOTE — Telephone Encounter (Signed)
Pt stated her BP most of the time was around 140/80 but a couple of times in jumped up to 177/76; I instructed pt. Not to worry about it, and I would inform Dr. Claiborne Billings about this. Pt also stated she had an infection that involves her whole body that came from a tooth being pulled last week and was taking keflex for the infection. Pt. Stated she would call back after she had finished the ABT and see if Dr. Claiborne Billings wants her to go back on her BP med, pt. States she is still having some dizziness but not as bad as it was when she was taking the BP med

## 2013-12-02 NOTE — Telephone Encounter (Signed)
Pt. Informed of Dr. Evette Georges instructions

## 2013-12-29 ENCOUNTER — Other Ambulatory Visit: Payer: Self-pay | Admitting: Cardiovascular Disease

## 2013-12-29 NOTE — Telephone Encounter (Signed)
Rx refill sent to patient pharmacy   

## 2014-01-22 ENCOUNTER — Encounter (HOSPITAL_COMMUNITY): Payer: Self-pay | Admitting: Cardiology

## 2014-03-20 ENCOUNTER — Other Ambulatory Visit: Payer: Self-pay | Admitting: Obstetrics and Gynecology

## 2014-03-20 DIAGNOSIS — M8580 Other specified disorders of bone density and structure, unspecified site: Secondary | ICD-10-CM

## 2014-05-04 ENCOUNTER — Telehealth: Payer: Self-pay | Admitting: Cardiovascular Disease

## 2014-05-04 NOTE — Telephone Encounter (Signed)
Returned call to patient no answer.Left message on personal voice mail will send message to Dr.Kelly for advice. 

## 2014-05-04 NOTE — Telephone Encounter (Signed)
Please call, pt thinks she is having side effects from her Losartan.Pt is having cramps in her neck and shoulder.
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
## 2014-05-05 NOTE — Telephone Encounter (Signed)
Probably not due to losartan

## 2014-05-18 ENCOUNTER — Telehealth: Payer: Self-pay | Admitting: Cardiovascular Disease

## 2014-05-18 NOTE — Telephone Encounter (Signed)
Pt reports med change - she was d/c'ed from losartan by PCP & put on metoprolol.  Stopping losartan apparently resolved problem she'd been having w/ muscle spasms.  She reports since changing meds, over a few days her BP seems to have crept up. She currently reports BPs typically in 150s/70s. HR in 70s.  She reports no noticeable fatigue since starting metoprolol. Advised her I would route to Dr. Claiborne Billings for recommendation on addtl med or dose increase.   Med list updated.

## 2014-05-18 NOTE — Telephone Encounter (Signed)
[name] [name] is calling because her bp went up over the weekend to 181/87 with pulse of 66 and this morning it 163/80 w/pulse of 54 after two cups of coffee. Last week the highest that it went up was 151/73 with of 71 please call    Thanks

## 2014-05-19 NOTE — Telephone Encounter (Signed)
Continue to monitor BP if continues to be 150 or greater may need to resume low dose lisinopril 2.5 or losartan 25 mg in addition to her bb

## 2014-05-19 NOTE — Telephone Encounter (Signed)
Advised a few additional days of readings & to call back if remaining elevated. Pt voiced understanding.

## 2014-05-27 ENCOUNTER — Encounter: Payer: Self-pay | Admitting: Internal Medicine

## 2014-06-02 ENCOUNTER — Telehealth: Payer: Self-pay | Admitting: Cardiovascular Disease

## 2014-06-02 NOTE — Telephone Encounter (Signed)
Patient returned call  blood pressure-- Tuesday  05/26/14    135/76  P 60                Wednesday  05/27/14  161/80  P71 pm   Thursday       05/28/14  154/78  p77 pm   Friday            05/29/14   140/77  p 70 am   Sat                 05/30/14   168/82  p 76 am   Sun            05/31/14  153/78  p 64 am   tues   06/02/14 127/77  p 49 am Patient states she has tried lisinopril 5 mg and after 6 month  Developed trouble breathing and sinus issues .she states she is not eager to restart lisinopril or losartan - patient states after 6 month had painful muscle spasm. She also states she has used HCTZ - LOW DOSE  - K+dropped. Informed patient to continue to take medication and reassure her. will defer information to Dr Claiborne Billings TO REVIEW to see if medication needs to change or added.

## 2014-06-02 NOTE — Telephone Encounter (Signed)
[name] [name] is calling because she wants to know Dr. Claiborne Billings needs to change her bp medication because her bp numbers has changed .Marland Kitchen Please call   Thanks

## 2014-06-02 NOTE — Telephone Encounter (Signed)
Left message to call back - may speak to any triage nurse

## 2014-06-08 NOTE — Telephone Encounter (Signed)
Closed encounter °

## 2014-06-08 NOTE — Telephone Encounter (Signed)
Left message to call back  

## 2014-06-08 NOTE — Telephone Encounter (Signed)
Try losartan 25 mg; should not cause muscle aches

## 2014-06-09 ENCOUNTER — Telehealth: Payer: Self-pay | Admitting: Family Medicine

## 2014-06-09 MED ORDER — LOSARTAN POTASSIUM 25 MG PO TABS: 25.0000 mg | ORAL_TABLET | Freq: Every day | ORAL | Status: DC

## 2014-06-09 NOTE — Telephone Encounter (Signed)
Patient currently has medicare and [employer]. Patient is currently on toprol, atorvastatin, asa 81mg , vitamins, calcium, acid reflux medication. Appointment given for 5/18 with Dr. Sabra Heck.

## 2014-06-09 NOTE — Telephone Encounter (Signed)
Patient return call. RN informed her of Dr Evette Georges recommendations- restart LOSARTAN in addition to taking other medications. Patient states  Recently and she has been having a lot of pain ,but states she has a high tolerance and wnated to know if that may be causing elevated blood pressure. She also states she has been having muscle aches in shoulders. She think it may be her cholesterol medications.  RN informed patient - pain may affect blood pressure - recommend following Dr Evette Georges orders and continue to monitor. She can take a statin holiday for a month, restart statin and contact office. Patient verbalized understanding. Medication e-sent to pharmacy

## 2014-06-26 ENCOUNTER — Encounter: Payer: Self-pay | Admitting: *Deleted

## 2014-07-01 ENCOUNTER — Encounter: Payer: Self-pay | Admitting: Family Medicine

## 2014-07-01 ENCOUNTER — Ambulatory Visit (INDEPENDENT_AMBULATORY_CARE_PROVIDER_SITE_OTHER): Payer: Medicare HMO | Admitting: Family Medicine

## 2014-07-01 VITALS — BP 141/73 | HR 49 | Temp 97.6°F | Ht 63.0 in | Wt 160.0 lb

## 2014-07-01 DIAGNOSIS — I25810 Atherosclerosis of coronary artery bypass graft(s) without angina pectoris: Secondary | ICD-10-CM

## 2014-07-01 DIAGNOSIS — F329 Major depressive disorder, single episode, unspecified: Secondary | ICD-10-CM

## 2014-07-01 DIAGNOSIS — F32A Depression, unspecified: Secondary | ICD-10-CM

## 2014-07-01 NOTE — Progress Notes (Signed)
   Subjective:    Patient ID: [name] [name], female    DOB: [birth year], 64 y.o.   MRN: [id number]  HPI 64 year old female, new patient to this practice from [place]. She has a history of coronary artery disease and has had bypass surgery and does well from that. Her medications in that regard include metoprolol and atorvastatin, but she has been off the atorvastatin for about 3 months since it causes myalgias. Apparently she was only taking 10 mg.  Generally, she feels well there may be some depression but she would rather not take medication but just try to keep herself busy and get her mind off of her worries  Patient Active Problem List   Diagnosis Date Noted  . Bradycardia 10/10/2013  . Dyspnea 11/20/2012  . Chest pain 11/20/2012  . S/P CABG x 2 06/19/2011  . CAD (coronary artery disease) CABG X 2 05/26/11 05/27/2011  . Abnormal screening cardiac CT, > 50% LMCA; Confirmed by Cardiac Cath 60-70% lesion 05/24/2011  . Unstable angina 05/23/2011  . Anxiety 05/23/2011  . Depression 05/23/2011  . Renal cyst 05/23/2011  . History of smoking, quit 2008 05/23/2011   Outpatient Encounter Prescriptions as of 07/01/2014  Medication Sig  . aspirin 81 MG tablet Take 81 mg by mouth daily.  Marland Kitchen atorvastatin (LIPITOR) 10 MG tablet TAKE 1 TABLET BY MOUTH EVERY DAY  . estradiol (ESTRACE) 0.1 MG/GM vaginal cream Place 2 g vaginally 2 (two) times a week. 2 times a month  . metoprolol succinate (TOPROL-XL) 25 MG 24 hr tablet Take 25 mg by mouth daily.  . pantoprazole (PROTONIX) 40 MG tablet Take 40 mg by mouth as needed.   . [DISCONTINUED] losartan (COZAAR) 25 MG tablet Take 1 tablet (25 mg total) by mouth daily.   No facility-administered encounter medications on file as of 07/01/2014.       Review of Systems  Constitutional: Negative.   HENT: Negative.   Respiratory: Negative.   Cardiovascular: Negative.   Genitourinary: Negative.   Neurological: Negative.   Psychiatric/Behavioral: Negative.       Objective:   Physical Exam  Constitutional: She is oriented to person, place, and time. She appears well-developed and well-nourished.  HENT:  Head: Normocephalic.  Cardiovascular: Normal rate and regular rhythm.   Patient is mildly bradycardic but blood pressure is okay. I explained that if pressure is okay that is achy determinate concerning low pulse rate  Pulmonary/Chest: Effort normal.  Musculoskeletal: Normal range of motion.  Neurological: She is alert and oriented to person, place, and time.  Psychiatric: She has a normal mood and affect. Her behavior is normal.    BP 141/73 mmHg  Pulse 49  Temp(Src) 97.6 F (36.4 C) (Oral)  Ht 5\' 3"  (1.6 m)  Wt 160 lb (72.576 kg)  BMI 28.35 kg/m2       Assessment & Plan:  1. Coronary artery disease involving coronary bypass graft of native heart without angina pectoris She has no current chest pain. She is followed by cardiologist who now sees her yearly. She will restart atorvastatin taking 10 mg every other day and plan to repeat lipids in 2 months 2. Depression We mutually decided to hold off medication but I would not resist using antidepressant if she feels the need  Wardell Honour MD

## 2014-07-03 ENCOUNTER — Telehealth: Payer: Self-pay | Admitting: Family Medicine

## 2014-07-03 MED ORDER — CITALOPRAM HYDROBROMIDE 20 MG PO TABS: 20.0000 mg | ORAL_TABLET | Freq: Every day | ORAL | Status: DC

## 2014-07-03 NOTE — Telephone Encounter (Signed)
Begin citalopram 20 mg #30 with 2 refills and also referral to outpatient mental health for counseling for depression

## 2014-07-03 NOTE — Telephone Encounter (Signed)
Rx sent to pharmacy per K[place]ley.

## 2014-07-21 ENCOUNTER — Ambulatory Visit: Payer: Self-pay | Admitting: Cardiovascular Disease

## 2014-07-24 ENCOUNTER — Ambulatory Visit (AMBULATORY_SURGERY_CENTER): Payer: Self-pay | Admitting: *Deleted

## 2014-07-24 VITALS — Ht 63.5 in | Wt 158.0 lb

## 2014-07-24 DIAGNOSIS — Z1211 Encounter for screening for malignant neoplasm of colon: Secondary | ICD-10-CM

## 2014-07-24 MED ORDER — NA SULFATE-K SULFATE-MG SULF 17.5-3.13-1.6 GM/177ML PO SOLN: 1.0000 | Freq: Once | ORAL | Status: DC

## 2014-07-24 NOTE — Progress Notes (Signed)
No egg or soy allergy She has had issues with being hard to wake post op but no other issues with sedation No home 02 use No diet pills emmi declined  per Remo Lipps okay to leave procedure with Olevia Perches even though pt saw perry in the office in 2007. She has had no previous procedure . Only saw perry 1x.

## 2014-08-07 ENCOUNTER — Ambulatory Visit (AMBULATORY_SURGERY_CENTER): Payer: Medicare HMO | Admitting: Internal Medicine

## 2014-08-07 VITALS — BP 129/98 | HR 58 | Temp 97.6°F | Resp 21 | Ht 63.5 in | Wt 158.0 lb

## 2014-08-07 DIAGNOSIS — D125 Benign neoplasm of sigmoid colon: Secondary | ICD-10-CM

## 2014-08-07 DIAGNOSIS — Z1211 Encounter for screening for malignant neoplasm of colon: Secondary | ICD-10-CM | POA: Diagnosis not present

## 2014-08-07 DIAGNOSIS — K635 Polyp of colon: Secondary | ICD-10-CM | POA: Diagnosis not present

## 2014-08-07 MED ORDER — SODIUM CHLORIDE 0.9 % IV SOLN: 500.0000 mL | INTRAVENOUS | Status: DC

## 2014-08-07 NOTE — Op Note (Signed)
[facility]  [address]. [place], [address]   COLONOSCOPY PROCEDURE REPORT  PATIENT: [name] [name], [name] [name]  MR#: [id number] BIRTHDATE: 1951/01/15 , 53  yrs. old GENDER: female ENDOSCOPIST: Lafayette Dragon, MD REFERRED OF:HQRFXJO Ronita Hipps, M.D. PROCEDURE DATE:  08/07/2014 PROCEDURE:   Colonoscopy, screening, Colonoscopy with cold biopsy polypectomy, and Colonoscopy with snare polypectomy First Screening Colonoscopy - Avg.  risk and is 50 yrs.  old or older Yes.  Prior Negative Screening - Now for repeat screening. N/A  History of Adenoma - Now for follow-up colonoscopy & has been > or = to 3 yrs.  N/A  Polyps removed today? Yes ASA CLASS:   Class II INDICATIONS:Screening for colonic neoplasia and Colorectal Neoplasm Risk Assessment for this procedure is average risk. MEDICATIONS: Monitored anesthesia care and Propofol 240 mg IV  DESCRIPTION OF PROCEDURE:   After the risks benefits and alternatives of the procedure were thoroughly explained, informed consent was obtained.  The digital rectal exam revealed no abnormalities of the rectum.   The LB [id number]  endoscope was introduced through the anus and advanced to the cecum, which was identified by both the appendix and ileocecal valve. No adverse events experienced.   The quality of the prep was good.  (MoviPrep was used)  The instrument was then slowly withdrawn as the colon was fully examined. Estimated blood loss is zero unless otherwise noted in this procedure report.      COLON FINDINGS: Three polypoid shaped semi-pedunculated polyps measuring 15 mm in size were found in the sigmoid colon.  A polypectomy was performed using snare cautery.  The resection was complete, the polyp tissue was completely retrieved and sent to histology.  A polypectomy was performed with cold forceps.  The resection was complete, the polyp tissue was completely retrieved and sent to histology.There was mild diverticulosis of the  sigmoid colon  Retroflexed views revealed no abnormalities. The time to cecum = 3.41 Withdrawal time = 12.56   The scope was withdrawn and the procedure completed. COMPLICATIONS: There were no immediate complications.  ENDOSCOPIC IMPRESSION: 1.One semi-pedunculated 15 mm  polyps was  found in the sigmoid colon; polypectomy was performed using snare cautery; 2.two 4 mm sessil;e polyps were removed from sigmoid colon, polypectomy was performed with cold forceps 3. mild diverticulosis of the sigmoid colon  RECOMMENDATIONS: Await pathology results no aspirin or anti-inflammatory agents for 2 weeks Recall colonoscopy pending path report  eSigned:  Lafayette Dragon, MD 08/07/2014 12:14 PM   cc:   PATIENT NAME:  Shunte, Senseney MR#: [id number]

## 2014-08-07 NOTE — Patient Instructions (Signed)
Polyps removed and sent to pathology.  Await pathology results for final recommendations.  No aspirin or anti-inflammatory products for 2 weeks.     YOU HAD AN ENDOSCOPIC PROCEDURE TODAY AT [facility]:   Refer to the procedure report that was given to you for any specific questions about what was found during the examination.  If the procedure report does not answer your questions, please call your gastroenterologist to clarify.  If you requested that your care partner not be given the details of your procedure findings, then the procedure report has been included in a sealed envelope for you to review at your convenience later.  YOU SHOULD EXPECT: Some feelings of bloating in the abdomen. Passage of more gas than usual.  Walking can help get rid of the air that was put into your GI tract during the procedure and reduce the bloating. If you had a lower endoscopy (such as a colonoscopy or flexible sigmoidoscopy) you may notice spotting of blood in your stool or on the toilet paper. If you underwent a bowel prep for your procedure, you may not have a normal bowel movement for a few days.  Please Note:  You might notice some irritation and congestion in your nose or some drainage.  This is from the oxygen used during your procedure.  There is no need for concern and it should clear up in a day or so.  SYMPTOMS TO REPORT IMMEDIATELY:   Following lower endoscopy (colonoscopy or flexible sigmoidoscopy):  Excessive amounts of blood in the stool  Significant tenderness or worsening of abdominal pains  Swelling of the abdomen that is new, acute  Fever of 100F or higher   For urgent or emergent issues, a gastroenterologist can be reached at any hour by calling [phone].   DIET: Your first meal following the procedure should be a small meal and then it is ok to progress to your normal diet. Heavy or fried foods are harder to digest and may make you feel nauseous or bloated.   Likewise, meals heavy in dairy and vegetables can increase bloating.  Drink plenty of fluids but you should avoid alcoholic beverages for 24 hours.  ACTIVITY:  You should plan to take it easy for the rest of today and you should NOT DRIVE or use heavy machinery until tomorrow (because of the sedation medicines used during the test).    FOLLOW UP: Our staff will call the number listed on your records the next business day following your procedure to check on you and address any questions or concerns that you may have regarding the information given to you following your procedure. If we do not reach you, we will leave a message.  However, if you are feeling well and you are not experiencing any problems, there is no need to return our call.  We will assume that you have returned to your regular daily activities without incident.  If any biopsies were taken you will be contacted by phone or by letter within the next 1-3 weeks.  Please call us at [phone] if you have not heard about the biopsies in 3 weeks.    SIGNATURES/CONFIDENTIALITY: You and/or your care partner have signed paperwork which will be entered into your electronic medical record.  These signatures attest to the fact that that the information above on your After Visit Summary has been reviewed and is understood.  Full responsibility of the confidentiality of this discharge information lies with you and/or your  care-partner.

## 2014-08-07 NOTE — Progress Notes (Signed)
Pt had clear drool looking fluid at corner of mouth and on pillow ( no yellow/bile looking fluid) and was couching toward end of procedure.  Pt positioned in steep tburg and suctioned approx 20-30 ccs in canister, no more sedation given after and dr Olevia Perches notified.  Pt did arouse toward end of procedure and situation explained to her and she stated that he throat was sore.  In PACU as pt more awake she said that she did have acid reflu and it does wake he up at night burning but she doesn't take anything for it.  Kristen PACU RN made aware.

## 2014-08-07 NOTE — Progress Notes (Signed)
Called to room to assist during endoscopic procedure.  Patient ID and intended procedure confirmed with present staff. Received instructions for my participation in the procedure from the performing physician.  

## 2014-08-10 ENCOUNTER — Telehealth: Payer: Self-pay | Admitting: *Deleted

## 2014-08-10 NOTE — Telephone Encounter (Signed)
  Follow up Call-  Call back number 08/07/2014  Post procedure Call Back phone  # [phone]  Permission to leave phone message Yes     Patient questions:  Do you have a fever, pain , or abdominal swelling? No. Pain Score  0 *  Have you tolerated food without any problems? Yes.    Have you been able to return to your normal activities? No. States that she has a sinus infection.  Told to go to her PCP if condition worsens.  Do you have any questions about your discharge instructions: Diet   No. Medications  No. Follow up visit  No.  Do you have questions or concerns about your Care? Yes.    Actions: * If pain score is 4 or above: No action needed, pain <4.

## 2014-08-11 ENCOUNTER — Encounter: Payer: Self-pay | Admitting: Internal Medicine

## 2014-08-18 ENCOUNTER — Other Ambulatory Visit: Payer: Self-pay | Admitting: *Deleted

## 2014-08-18 MED ORDER — ATORVASTATIN CALCIUM 10 MG PO TABS: 10.0000 mg | ORAL_TABLET | Freq: Every day | ORAL | Status: DC

## 2014-08-18 NOTE — Telephone Encounter (Signed)
Rx(s) sent to pharmacy electronically.  

## 2014-08-21 ENCOUNTER — Encounter (INDEPENDENT_AMBULATORY_CARE_PROVIDER_SITE_OTHER): Payer: Self-pay

## 2014-08-21 ENCOUNTER — Encounter: Payer: Self-pay | Admitting: Family Medicine

## 2014-08-21 ENCOUNTER — Ambulatory Visit (INDEPENDENT_AMBULATORY_CARE_PROVIDER_SITE_OTHER): Payer: Medicare HMO | Admitting: Family Medicine

## 2014-08-21 VITALS — BP 148/69 | HR 60 | Temp 98.0°F | Ht 63.0 in | Wt 158.4 lb

## 2014-08-21 DIAGNOSIS — J0100 Acute maxillary sinusitis, unspecified: Secondary | ICD-10-CM

## 2014-08-21 MED ORDER — LEVOFLOXACIN 500 MG PO TABS: 500.0000 mg | ORAL_TABLET | Freq: Every day | ORAL | Status: DC

## 2014-08-21 MED ORDER — BETAMETHASONE SOD PHOS & ACET 6 (3-3) MG/ML IJ SUSP
6.0000 mg | Freq: Once | INTRAMUSCULAR | Status: AC
  Administered 2014-08-21: 6 mg via INTRAMUSCULAR

## 2014-08-21 MED ORDER — CEFUROXIME AXETIL 500 MG PO TABS: 500.0000 mg | ORAL_TABLET | Freq: Two times a day (BID) | ORAL | Status: DC

## 2014-08-21 NOTE — Progress Notes (Signed)
Subjective:  Patient ID: [name] [name], female    DOB: 01-30-1951  Age: 64 y.o. MRN: [id number]  CC: URI   HPI [name] [name] presents for Symptoms include congestion, bilateral facial pain, nasal congestion, low grade  fever, non productive cough, post nasal drip and maxillary sinus pressure. Onset of symptoms was 2 weeks ago, gradually worsening since that time.   History [name] [name] has a past medical history of Hypertension; IBS (irritable bowel syndrome); Anxiety; Depression; Complication of anesthesia; Chronic kidney disease; GERD (gastroesophageal reflux disease); Heart murmur; and Hemorrhoids.   She has past surgical history that includes Abdominal hysterectomy (2003); Tonsillectomy (1962); Tubal ligation; Coronary artery bypass graft (05/26/2011); left heart catheterization with coronary angiogram (N/A, 05/24/2011); Cardiac catheterization (05/22/2011); and Coronary artery bypass graft.   Her family history includes Alcohol abuse in her sister; Cancer in her brother; Cancer (age of onset: 50) in her father; Diabetes in her mother; Drug abuse in her sister; Heart failure in her mother; Stroke in her mother. There is no history of Colon cancer.She reports that she quit smoking about 8 years ago. She has never used smokeless tobacco. She reports that she drinks alcohol. She reports that she does not use illicit drugs.  Outpatient Prescriptions Prior to Visit  Medication Sig Dispense Refill  . aspirin 81 MG tablet Take 81 mg by mouth daily.    Marland Kitchen atorvastatin (LIPITOR) 10 MG tablet Take 1 tablet (10 mg total) by mouth daily. <PLEASE MAKE APPOINTMENT FOR REFILLS> 30 tablet 1  . Calcium Carb-Cholecalciferol (CALCIUM 1000 + D PO) Take 1 capsule by mouth daily.    . citalopram (CELEXA) 20 MG tablet Take 1 tablet (20 mg total) by mouth daily. 30 tablet 2  . Estradiol (VAGIFEM) 10 MCG TABS vaginal tablet Place 10 mcg vaginally 2 (two) times a week.    . metoprolol succinate (TOPROL-XL) 25 MG 24 hr tablet  Take 25 mg by mouth daily.    . pantoprazole (PROTONIX) 40 MG tablet Take 40 mg by mouth as needed.      No facility-administered medications prior to visit.    ROS Review of Systems  Constitutional: Negative for fever, chills, activity change and appetite change.  HENT: Positive for congestion, postnasal drip, rhinorrhea and sinus pressure. Negative for ear discharge, ear pain, hearing loss, nosebleeds, sneezing and trouble swallowing.   Respiratory: Negative for chest tightness and shortness of breath.   Cardiovascular: Negative for chest pain and palpitations.  Skin: Negative for rash.    Objective:  BP 148/69 mmHg  Pulse 60  Temp(Src) 98 F (36.7 C) (Oral)  Ht 5\' 3"  (1.6 m)  Wt 158 lb 6.4 oz (71.85 kg)  BMI 28.07 kg/m2  BP Readings from Last 3 Encounters:  08/21/14 148/69  08/07/14 129/98  07/01/14 141/73    Wt Readings from Last 3 Encounters:  08/21/14 158 lb 6.4 oz (71.85 kg)  08/07/14 158 lb (71.668 kg)  07/24/14 158 lb (71.668 kg)     Physical Exam  Constitutional: She appears well-developed and well-nourished.  HENT:  Head: Normocephalic and atraumatic.  Right Ear: Tympanic membrane and external ear normal. No decreased hearing is noted.  Left Ear: Tympanic membrane and external ear normal. No decreased hearing is noted.  Nose: Mucosal edema present. Right sinus exhibits no frontal sinus tenderness. Left sinus exhibits no frontal sinus tenderness.  Mouth/Throat: No oropharyngeal exudate or posterior oropharyngeal erythema.  Neck: No Brudzinski's sign noted.  Pulmonary/Chest: Breath sounds normal. No respiratory distress.  Lymphadenopathy:  Head (right side): No preauricular adenopathy present.       Head (left side): No preauricular adenopathy present.       Right cervical: No superficial cervical adenopathy present.      Left cervical: No superficial cervical adenopathy present.    Lab Results  Component Value Date   HGBA1C 5.8* 05/25/2011     Lab Results  Component Value Date   WBC 6.0 11/20/2012   HGB 13.9 11/20/2012   HCT 41.1 11/20/2012   PLT 368 11/20/2012   GLUCOSE 89 11/20/2012   CHOL 127 11/20/2012   TRIG 55 11/20/2012   HDL 56 11/20/2012   LDLCALC 60 11/20/2012   ALT 10 05/23/2011   AST 13 05/23/2011   NA 137 11/20/2012   K 4.4 11/20/2012   CL 107 11/20/2012   CREATININE 0.75 11/20/2012   BUN 15 11/20/2012   CO2 27 11/20/2012   TSH 0.635 05/23/2011   INR 1.24 05/26/2011   HGBA1C 5.8* 05/25/2011    Mm Digital Screening  12/26/2012   CLINICAL DATA:  Screening.  EXAM: DIGITAL SCREENING BILATERAL MAMMOGRAM WITH CAD  COMPARISON:  Previous exam(s)  ACR Breast Density Category a: The breast tissue is almost entirely fatty.  FINDINGS: There are no findings suspicious for malignancy. Images were processed with CAD.  IMPRESSION: No mammographic evidence of malignancy. A result letter of this screening mammogram will be mailed directly to the patient.  RECOMMENDATION: Screening mammogram in one year. (Code:SM-B-01Y)  BI-RADS CATEGORY  1: Negative   Electronically Signed   By: Willadean Carol M.D.   On: 12/26/2012 14:28    Assessment & Plan:   [name] [name] was seen today for uri.  Diagnoses and all orders for this visit:  Acute maxillary sinusitis, recurrence not specified Orders: -     betamethasone acetate-betamethasone sodium phosphate (CELESTONE) injection 6 mg; Inject 1 mL (6 mg total) into the muscle once.  Other orders -     Discontinue: levofloxacin (LEVAQUIN) 500 MG tablet; Take 1 tablet (500 mg total) by mouth daily. -     cefUROXime (CEFTIN) 500 MG tablet; Take 1 tablet (500 mg total) by mouth 2 (two) times daily with a meal.   I have discontinued [name] [name]'s levofloxacin. I am also having her start on cefUROXime. Additionally, I am having her maintain her pantoprazole, aspirin, metoprolol succinate, citalopram, Calcium Carb-Cholecalciferol (CALCIUM 1000 + D PO), Estradiol, and atorvastatin. We will  continue to administer betamethasone acetate-betamethasone sodium phosphate.  Meds ordered this encounter  Medications  . DISCONTD: levofloxacin (LEVAQUIN) 500 MG tablet    Sig: Take 1 tablet (500 mg total) by mouth daily.    Dispense:  10 tablet    Refill:  0  . betamethasone acetate-betamethasone sodium phosphate (CELESTONE) injection 6 mg    Sig:   . cefUROXime (CEFTIN) 500 MG tablet    Sig: Take 1 tablet (500 mg total) by mouth 2 (two) times daily with a meal.    Dispense:  20 tablet    Refill:  0    Please cancel previous prescription sent in for levofloxacin. Thanks, WS     Follow-up: No Follow-up on file.  Claretta Fraise, M.D.

## 2014-08-21 NOTE — Addendum Note (Signed)
Addended by: Marin Olp on: 08/21/2014 06:48 PM   Modules accepted: Miquel Dunn

## 2014-08-25 ENCOUNTER — Telehealth: Payer: Self-pay | Admitting: Family Medicine

## 2014-08-25 MED ORDER — BENZONATATE 200 MG PO CAPS: 200.0000 mg | ORAL_CAPSULE | Freq: Three times a day (TID) | ORAL | Status: DC | PRN

## 2014-08-25 NOTE — Telephone Encounter (Signed)
Done, tell pt. pls.

## 2014-08-25 NOTE — Telephone Encounter (Signed)
Pt notified of Rx Verbalizes understanding 

## 2014-08-31 ENCOUNTER — Encounter (INDEPENDENT_AMBULATORY_CARE_PROVIDER_SITE_OTHER): Payer: Self-pay

## 2014-08-31 ENCOUNTER — Encounter: Payer: Self-pay | Admitting: Family Medicine

## 2014-08-31 ENCOUNTER — Ambulatory Visit (INDEPENDENT_AMBULATORY_CARE_PROVIDER_SITE_OTHER): Payer: Medicare HMO | Admitting: Family Medicine

## 2014-08-31 ENCOUNTER — Ambulatory Visit (INDEPENDENT_AMBULATORY_CARE_PROVIDER_SITE_OTHER): Payer: Medicare HMO

## 2014-08-31 VITALS — BP 150/78 | HR 58 | Temp 98.0°F | Ht 63.0 in | Wt 159.0 lb

## 2014-08-31 DIAGNOSIS — R05 Cough: Secondary | ICD-10-CM

## 2014-08-31 DIAGNOSIS — H9201 Otalgia, right ear: Secondary | ICD-10-CM | POA: Diagnosis not present

## 2014-08-31 DIAGNOSIS — M79672 Pain in left foot: Secondary | ICD-10-CM | POA: Diagnosis not present

## 2014-08-31 DIAGNOSIS — I2510 Atherosclerotic heart disease of native coronary artery without angina pectoris: Secondary | ICD-10-CM

## 2014-08-31 DIAGNOSIS — R059 Cough, unspecified: Secondary | ICD-10-CM

## 2014-08-31 MED ORDER — ALBUTEROL SULFATE HFA 108 (90 BASE) MCG/ACT IN AERS: 2.0000 | INHALATION_SPRAY | Freq: Four times a day (QID) | RESPIRATORY_TRACT | Status: DC | PRN

## 2014-08-31 NOTE — Progress Notes (Signed)
Subjective:    Patient ID: [name] [name], female    DOB: 1950-08-21, 64 y.o.   MRN: [id number]  HPI Patient here today for 2 month follow up on depression and CAD. She also has several other complaints today which includes cough, right ear pain and Left heel pain.  She was treated last week for sinus infection but now her symptoms are more cough. Cough is occasionally productive of green sputum. She stopped anabiotic after 2 days because it in some way seemed to disagree with her. She complains of pain behind her right ear almost in the mastoid bone. She also has a sore on her heel which she has picked at and made sore there was no foreign body by history      Patient Active Problem List   Diagnosis Date Noted  . Bradycardia 10/10/2013  . Dyspnea 11/20/2012  . Chest pain 11/20/2012  . S/P CABG x 2 06/19/2011  . CAD (coronary artery disease) CABG X 2 05/26/11 05/27/2011  . Abnormal screening cardiac CT, > 50% LMCA; Confirmed by Cardiac Cath 60-70% lesion 05/24/2011  . Unstable angina 05/23/2011  . Anxiety 05/23/2011  . Depression 05/23/2011  . Renal cyst 05/23/2011  . History of smoking, quit 2008 05/23/2011   Outpatient Encounter Prescriptions as of 08/31/2014  Medication Sig  . aspirin 81 MG tablet Take 81 mg by mouth daily.  Marland Kitchen atorvastatin (LIPITOR) 10 MG tablet Take 1 tablet (10 mg total) by mouth daily. <PLEASE MAKE APPOINTMENT FOR REFILLS>  . Calcium Carb-Cholecalciferol (CALCIUM 1000 + D PO) Take 1 capsule by mouth daily.  . citalopram (CELEXA) 20 MG tablet Take 1 tablet (20 mg total) by mouth daily.  . Estradiol (VAGIFEM) 10 MCG TABS vaginal tablet Place 10 mcg vaginally 2 (two) times a week.  . metoprolol succinate (TOPROL-XL) 25 MG 24 hr tablet Take 25 mg by mouth daily.  . pantoprazole (PROTONIX) 40 MG tablet Take 40 mg by mouth as needed.   . [DISCONTINUED] benzonatate (TESSALON) 200 MG capsule Take 1 capsule (200 mg total) by mouth 3 (three) times daily as needed for  cough.  . [DISCONTINUED] cefUROXime (CEFTIN) 500 MG tablet Take 1 tablet (500 mg total) by mouth 2 (two) times daily with a meal.   No facility-administered encounter medications on file as of 08/31/2014.      Review of Systems  Constitutional: Negative.   HENT: Positive for congestion and ear pain (right).   Eyes: Negative.   Respiratory: Positive for cough.   Cardiovascular: Negative.   Gastrointestinal: Positive for nausea.  Endocrine: Negative.   Genitourinary: Negative.   Musculoskeletal: Negative.   Skin: Negative.        Left heel pain  Allergic/Immunologic: Negative.   Neurological: Negative.   Hematological: Negative.   Psychiatric/Behavioral: Negative.        Objective:   Physical Exam  Constitutional: She is oriented to person, place, and time. She appears well-developed and well-nourished.  HENT:  Head: Normocephalic.  Tympanic membranes are dull bilaterally there is tenderness and in the right mastoid area as well as occipital adenopathy  Cardiovascular: Normal rate and regular rhythm.   Pulmonary/Chest: Effort normal. She has wheezes.  Neurological: She is alert and oriented to person, place, and time.   BP 150/78 mmHg  Pulse 58  Temp(Src) 98 F (36.7 C) (Oral)  Ht 5\' 3"  (1.6 m)  Wt 159 lb (72.122 kg)  BMI 28.17 kg/m2        Assessment & Plan:  1. Cough History exam and lack of infiltrate suggest probable bronchitis plan will try doxycycline in place of Ceftin. Take along with over-the-counter Mucinex and refill albuterol inhaler - DG Chest 2 View; Future

## 2014-09-03 ENCOUNTER — Emergency Department (HOSPITAL_COMMUNITY)
Admission: EM | Admit: 2014-09-03 | Discharge: 2014-09-03 | Disposition: A | Discharge status: Home / Self Care | Payer: Medicare HMO | Attending: Emergency Medicine | Admitting: Emergency Medicine

## 2014-09-03 ENCOUNTER — Encounter (HOSPITAL_COMMUNITY): Payer: Self-pay | Admitting: Emergency Medicine

## 2014-09-03 DIAGNOSIS — Z79899 Other long term (current) drug therapy: Secondary | ICD-10-CM | POA: Diagnosis not present

## 2014-09-03 DIAGNOSIS — I129 Hypertensive chronic kidney disease with stage 1 through stage 4 chronic kidney disease, or unspecified chronic kidney disease: Secondary | ICD-10-CM | POA: Diagnosis not present

## 2014-09-03 DIAGNOSIS — R011 Cardiac murmur, unspecified: Secondary | ICD-10-CM | POA: Insufficient documentation

## 2014-09-03 DIAGNOSIS — N189 Chronic kidney disease, unspecified: Secondary | ICD-10-CM | POA: Insufficient documentation

## 2014-09-03 DIAGNOSIS — J209 Acute bronchitis, unspecified: Secondary | ICD-10-CM | POA: Insufficient documentation

## 2014-09-03 DIAGNOSIS — K219 Gastro-esophageal reflux disease without esophagitis: Secondary | ICD-10-CM | POA: Insufficient documentation

## 2014-09-03 DIAGNOSIS — Z793 Long term (current) use of hormonal contraceptives: Secondary | ICD-10-CM | POA: Diagnosis not present

## 2014-09-03 DIAGNOSIS — R0989 Other specified symptoms and signs involving the circulatory and respiratory systems: Secondary | ICD-10-CM | POA: Insufficient documentation

## 2014-09-03 DIAGNOSIS — Z91040 Latex allergy status: Secondary | ICD-10-CM | POA: Insufficient documentation

## 2014-09-03 DIAGNOSIS — F419 Anxiety disorder, unspecified: Secondary | ICD-10-CM | POA: Diagnosis not present

## 2014-09-03 DIAGNOSIS — Z7982 Long term (current) use of aspirin: Secondary | ICD-10-CM | POA: Insufficient documentation

## 2014-09-03 DIAGNOSIS — J40 Bronchitis, not specified as acute or chronic: Secondary | ICD-10-CM

## 2014-09-03 DIAGNOSIS — M542 Cervicalgia: Secondary | ICD-10-CM | POA: Insufficient documentation

## 2014-09-03 DIAGNOSIS — F329 Major depressive disorder, single episode, unspecified: Secondary | ICD-10-CM | POA: Diagnosis not present

## 2014-09-03 DIAGNOSIS — R05 Cough: Secondary | ICD-10-CM | POA: Diagnosis present

## 2014-09-03 DIAGNOSIS — Z87891 Personal history of nicotine dependence: Secondary | ICD-10-CM | POA: Insufficient documentation

## 2014-09-03 MED ORDER — DOXYCYCLINE HYCLATE 100 MG PO TABS
100.0000 mg | ORAL_TABLET | Freq: Once | ORAL | Status: AC
  Administered 2014-09-03: 100 mg via ORAL
  Filled 2014-09-03: qty 1

## 2014-09-03 MED ORDER — PREDNISONE 20 MG PO TABS: 40.0000 mg | ORAL_TABLET | Freq: Every day | ORAL | Status: DC

## 2014-09-03 MED ORDER — DOXYCYCLINE HYCLATE 100 MG PO CAPS: 100.0000 mg | ORAL_CAPSULE | Freq: Two times a day (BID) | ORAL | Status: DC

## 2014-09-03 NOTE — ED Notes (Signed)
Labs cancelled by Dr. Sabra Heck. Pt to have EKG before discharge.

## 2014-09-03 NOTE — ED Notes (Addendum)
Pt reports since June 20th has had nausea,cough,congestion, body aches, abdominal pain. Pt reports seen by pcp and diagnosed with bronchitis. Pt reports has finished abx and is taking OTC mucinex with minimal relief of nasal congestion. Pt denies any emesis or diarrhea.

## 2014-09-03 NOTE — ED Provider Notes (Signed)
CSN: [id number]     Arrival date & time 09/03/14  1725 History   First MD Initiated Contact with Patient 09/03/14 1852     Chief Complaint  Patient presents with  . Abdominal Pain     (Consider location/radiation/quality/duration/timing/severity/associated sxs/prior Treatment) HPI  The patient is a 64 year old female, she has a history of hypertension and hypercholesterolemia, she also has a history of approximately 1 month of a myriad of upper respiratory symptoms including cough, congestion, body aches, sinus drainage. She has been through a course of antibiotics, she does not member the name of the antibiotics and states that it did nothing to improve her symptoms. She continues to cough throughout the day and the night, she denies fevers chills continues to have significant congestion. This despite using medications over-the-counter. She denies chest pain, back pain, swelling of the legs   Past Medical History  Diagnosis Date  . Hypertension   . IBS (irritable bowel syndrome)   . Anxiety   . Depression   . Complication of anesthesia     difficult waking up "  . Chronic kidney disease     cyst left kidney  . GERD (gastroesophageal reflux disease)   . Heart murmur     as child  . Hemorrhoids     will occ bleed    Past Surgical History  Procedure Laterality Date  . Abdominal hysterectomy  2003  . Tonsillectomy  1962  . Tubal ligation    . Coronary artery bypass graft  05/26/2011    Procedure: CORONARY ARTERY BYPASS GRAFTING (CABG);  Surgeon: Ivin Poot, MD;  Location: [place];  Service: Open Heart Surgery;  Laterality: N/A;  Coronary Artery Bypass Graft on Pump times two utlizing left internal mammary artery and right greater saphenous vein harvested endoscopically   . Left heart catheterization with coronary angiogram N/A 05/24/2011    Procedure: LEFT HEART CATHETERIZATION WITH CORONARY ANGIOGRAM;  Surgeon: Leonie Man, MD;  Location: [facility] CATH LAB;  Service:  Cardiovascular;  Laterality: N/A;  . Cardiac catheterization  05/22/2011  . Coronary artery bypass graft      done by Dr. Kerby Less   Family History  Problem Relation Age of Onset  . Stroke Mother   . Diabetes Mother     history of diabetes  . Heart failure Mother   . Cancer Father 96    bone cancer  . Drug abuse Sister   . Cancer Brother     throat  . Alcohol abuse Sister   . Colon cancer Neg Hx    History  Substance Use Topics  . Smoking status: Former Smoker    Quit date: 05/23/2006  . Smokeless tobacco: Never Used  . Alcohol Use: 0.0 oz/week    0 Standard drinks or equivalent per week     Comment: rare glass of wine   OB History    No data available     Review of Systems  All other systems reviewed and are negative.     Allergies  Amlodipine; Cetirizine & related; Levofloxacin; Lisinopril; and Latex  Home Medications   Prior to Admission medications   Medication Sig Start Date End Date Taking? Authorizing Provider  albuterol (PROVENTIL HFA;VENTOLIN HFA) 108 (90 BASE) MCG/ACT inhaler Inhale 2 puffs into the lungs every 6 (six) hours as needed for wheezing or shortness of breath. 08/31/14  Yes Wardell Honour, MD  aspirin 81 MG tablet Take 81 mg by mouth daily.   Yes Historical Provider, MD  atorvastatin (LIPITOR) 10 MG tablet Take 1 tablet (10 mg total) by mouth daily. <PLEASE MAKE APPOINTMENT FOR REFILLS> 08/18/14  Yes Troy Sine, MD  Calcium Carb-Cholecalciferol (CALCIUM 1000 + D PO) Take 1 capsule by mouth daily.   Yes Historical Provider, MD  citalopram (CELEXA) 20 MG tablet Take 1 tablet (20 mg total) by mouth daily. 07/03/14  Yes Wardell Honour, MD  Estradiol (VAGIFEM) 10 MCG TABS vaginal tablet Place 10 mcg vaginally 2 (two) times a week.   Yes Historical Provider, MD  metoprolol succinate (TOPROL-XL) 25 MG 24 hr tablet Take 25 mg by mouth daily.   Yes Historical Provider, MD  pantoprazole (PROTONIX) 40 MG tablet Take 40 mg by mouth as needed.    Yes  Historical Provider, MD  doxycycline (VIBRAMYCIN) 100 MG capsule Take 1 capsule (100 mg total) by mouth 2 (two) times daily. 09/03/14   Noemi Chapel, MD  predniSONE (DELTASONE) 20 MG tablet Take 2 tablets (40 mg total) by mouth daily. 09/03/14   Noemi Chapel, MD   BP 171/73 mmHg  Pulse 49  Temp(Src) 97.8 F (36.6 C) (Oral)  Resp 16  Ht $R'5\' 4"'Hj$  (1.626 m)  Wt 158 lb (71.668 kg)  BMI 27.11 kg/m2  SpO2 98% Physical Exam  Constitutional: She appears well-developed and well-nourished. No distress.  HENT:  Head: Normocephalic and atraumatic.  Mouth/Throat: Oropharynx is clear and moist. No oropharyngeal exudate.  Oropharynx is clear and moist, nasal passages are clear, no swelling of turbinates, no tenderness over the sinuses, tympanic members are normal.  Eyes: Conjunctivae and EOM are normal. Pupils are equal, round, and reactive to light. Right eye exhibits no discharge. Left eye exhibits no discharge. No scleral icterus.  Neck: Normal range of motion. Neck supple. No JVD present. No thyromegaly present.  Mild tenderness to the neck just inferior to the mastoid process on the right, no mastoid tenderness, no swelling or redness, no trismus or torticollis.  Cardiovascular: Normal rate, regular rhythm, normal heart sounds and intact distal pulses.  Exam reveals no gallop and no friction rub.   No murmur heard. Pulmonary/Chest: Effort normal. No respiratory distress. She has no wheezes. She has no rales.  Rhonchorous sounds heard throughout, no distress, speaks in full sentences  Abdominal: Soft. Bowel sounds are normal. She exhibits no distension and no mass. There is no tenderness.  Musculoskeletal: Normal range of motion. She exhibits no edema or tenderness.  Lymphadenopathy:    She has no cervical adenopathy.  Neurological: She is alert. Coordination normal.  Skin: Skin is warm and dry. No rash noted. No erythema.  Psychiatric: She has a normal mood and affect. Her behavior is normal.   Nursing note and vitals reviewed.   ED Course  Procedures (including critical care time) Labs Review Labs Reviewed - No data to display  Imaging Review No results found.   EKG Interpretation   Date/Time:  Thursday September 03 2014 19:30:55 EDT Ventricular Rate:  49 PR Interval:  150 QRS Duration: 86 QT Interval:  468 QTC Calculation: 422 R Axis:   76 Text Interpretation:  Sinus bradycardia Nonspecific T abnrm, anterolateral  leads since last tracing no significant change Confirmed by Eura Radabaugh  MD,  Hanley Woerner ([id number]) on 09/04/2014 12:03:06 AM      MDM   Final diagnoses:  Bronchitis     the patient is well-appearing, she has vital signs which are unremarkable other than mild hypertension, no hypoxia or difficulty breathing. At this time the patient likely has a  bronchitis or walking pneumonia, we will treat with a new antibiotics, she has an allergy to Levaquin thus we'll try doxycycline, prednisone, will give her a spacer for home. The patient is in agreement. We'll defer imaging AT this time as they are not indicated and a clinical diagnosis is sufficient. The patient has expressed her understanding to the limited workup and the need for close outpatient follow-up and is in total agreement.   The etiology of the patient's neck pain is unclear, she states she has trouble with it for over a month, it does not appear exertional or in accordance with chest pain, EKG to be sure.   Noemi Chapel, MD 09/04/14 [phone]

## 2014-09-03 NOTE — Discharge Instructions (Signed)

## 2014-09-08 ENCOUNTER — Telehealth: Payer: Self-pay | Admitting: Cardiovascular Disease

## 2014-09-08 NOTE — Telephone Encounter (Signed)
Closed encounter °

## 2014-09-10 ENCOUNTER — Other Ambulatory Visit: Payer: Self-pay | Admitting: Cardiovascular Disease

## 2014-09-10 MED ORDER — ATORVASTATIN CALCIUM 10 MG PO TABS: 10.0000 mg | ORAL_TABLET | Freq: Every day | ORAL | Status: DC

## 2014-11-18 ENCOUNTER — Ambulatory Visit (INDEPENDENT_AMBULATORY_CARE_PROVIDER_SITE_OTHER): Payer: Medicare HMO | Admitting: Cardiovascular Disease

## 2014-11-18 ENCOUNTER — Encounter: Payer: Self-pay | Admitting: Cardiovascular Disease

## 2014-11-18 ENCOUNTER — Telehealth: Payer: Self-pay | Admitting: Cardiovascular Disease

## 2014-11-18 ENCOUNTER — Other Ambulatory Visit: Payer: Self-pay | Admitting: *Deleted

## 2014-11-18 VITALS — BP 138/80 | HR 53 | Ht 63.5 in | Wt 162.2 lb

## 2014-11-18 DIAGNOSIS — E785 Hyperlipidemia, unspecified: Secondary | ICD-10-CM

## 2014-11-18 DIAGNOSIS — Z951 Presence of aortocoronary bypass graft: Secondary | ICD-10-CM | POA: Diagnosis not present

## 2014-11-18 DIAGNOSIS — I10 Essential (primary) hypertension: Secondary | ICD-10-CM

## 2014-11-18 DIAGNOSIS — Z79899 Other long term (current) drug therapy: Secondary | ICD-10-CM | POA: Diagnosis not present

## 2014-11-18 DIAGNOSIS — I2510 Atherosclerotic heart disease of native coronary artery without angina pectoris: Secondary | ICD-10-CM

## 2014-11-18 MED ORDER — NEBIVOLOL HCL 2.5 MG PO TABS: 2.5000 mg | ORAL_TABLET | Freq: Every day | ORAL | Status: DC

## 2014-11-18 NOTE — Patient Instructions (Addendum)
Your physician has recommended you make the following change in your medication: the metoprolol has been changed to Bystolic 2.5 mg daily. A nprescription has been sent to your pharmacy.  Your physician recommends that you return for lab work fasting.  Your physician has requested that you have an echocardiogram. Echocardiography is a painless test that uses sound waves to create images of your heart. It provides your doctor with information about the size and shape of your heart and how well your heart's chambers and valves are working. This procedure takes approximately one hour. There are no restrictions for this procedure.  Your physician recommends that you schedule a follow-up appointment in: 6 weeks.

## 2014-11-18 NOTE — Telephone Encounter (Signed)
Left message for patient not to start Bystolic prescription until tomorrow. Call back if questions.

## 2014-11-20 ENCOUNTER — Other Ambulatory Visit (INDEPENDENT_AMBULATORY_CARE_PROVIDER_SITE_OTHER): Payer: Medicare HMO

## 2014-11-20 DIAGNOSIS — I2510 Atherosclerotic heart disease of native coronary artery without angina pectoris: Secondary | ICD-10-CM | POA: Diagnosis not present

## 2014-11-20 DIAGNOSIS — E785 Hyperlipidemia, unspecified: Secondary | ICD-10-CM

## 2014-11-20 LAB — COMPREHENSIVE METABOLIC PANEL
ALT: 12 U/L (ref 6–29)
AST: 13 U/L (ref 10–35)
Albumin: 4.1 g/dL (ref 3.6–5.1)
Alkaline Phosphatase: 73 U/L (ref 33–130)
BUN: 14 mg/dL (ref 7–25)
CO2: 27 mmol/L (ref 20–31)
Calcium: 8.6 mg/dL (ref 8.6–10.4)
Chloride: 105 mmol/L (ref 98–110)
Creat: 0.69 mg/dL (ref 0.50–0.99)
Glucose, Bld: 94 mg/dL (ref 65–99)
Potassium: 4.2 mmol/L (ref 3.5–5.3)
Sodium: 142 mmol/L (ref 135–146)
Total Bilirubin: 0.5 mg/dL (ref 0.2–1.2)
Total Protein: 6.2 g/dL (ref 6.1–8.1)

## 2014-11-20 LAB — CBC
HCT: 42.3 % (ref 36.0–46.0)
Hemoglobin: 14.6 g/dL (ref 12.0–15.0)
MCH: 28.4 pg (ref 26.0–34.0)
MCHC: 34.5 g/dL (ref 30.0–36.0)
MCV: 82.3 fL (ref 78.0–100.0)
MPV: 8.6 fL (ref 8.6–12.4)
Platelets: 410 10*3/uL — ABNORMAL HIGH (ref 150–400)
RBC: 5.14 MIL/uL — ABNORMAL HIGH (ref 3.87–5.11)
RDW: 13.9 % (ref 11.5–15.5)
WBC: 5.8 10*3/uL (ref 4.0–10.5)

## 2014-11-20 LAB — LIPID PANEL
Cholesterol: 147 mg/dL (ref 125–200)
HDL: 63 mg/dL (ref >=46)
LDL Cholesterol: 69 mg/dL (ref <130)
Total CHOL/HDL Ratio: 2.3 Ratio (ref <=5.0)
Triglycerides: 77 mg/dL (ref <150)
VLDL: 15 mg/dL (ref <30)

## 2014-11-20 NOTE — Progress Notes (Signed)
Labs for dr. Shelva Majestic cbc cmp lipid tsh dx I25.10, Z79.899, e78.5

## 2014-11-21 ENCOUNTER — Encounter: Payer: Self-pay | Admitting: Cardiovascular Disease

## 2014-11-21 DIAGNOSIS — E785 Hyperlipidemia, unspecified: Secondary | ICD-10-CM | POA: Insufficient documentation

## 2014-11-21 DIAGNOSIS — I10 Essential (primary) hypertension: Secondary | ICD-10-CM | POA: Insufficient documentation

## 2014-11-21 LAB — TSH: TSH: 1.394 u[IU]/mL (ref 0.350–4.500)

## 2014-11-21 NOTE — Progress Notes (Signed)
Patient ID: Jerelyn Charles, female   DOB: July 20, 1950, 64 y.o.   MRN: [id number]     HPI: KENNIDY LAMKE is a 64 y.o. female who presents to the office today for a one year follow up cardiology evaluation.  Ms Laconte has CAD and underwent cardiac catheterization on 05/22/2011 which revealed 70% distal left main stenosis, 60% ostial circumflex stenosis and mild RCA disease.  She underwent CABG surgery x2 by Dr. Nils Pyle and had a LIMA placed to her LAD, and vein to the OM 2 vessel.  Ejection fraction was 50-55%.  In October 2014, she  noticed some vague chest fullness and underwent a nuclear perfusion study which demonstrated hyperdynamic LV with an EF of 83% post stress.  She did not have ECG changes; she had normal perfusion and function.  Over the past year, she has been without chest pain.  She has a history of vitamin D insufficiency in the past which had improved.  She has issues with muscle spasm for which he takes Flexeril on an as-needed basis.  She has GERD for which he takes Protonix on an as-needed basis.  She has been maintained on Lipitor 10 mg for hyperlipidemia.  Until 2 months ago, she had been on metoprolol and losartan.  However, she stopped these secondary to diarrhea.  She attempted to resume losartan, but again developed diarrhea.   She tried restarting her metoprolol and has taken this for the past 3 days but believes this is contributing to sinus problems and she did not take it today.  She is unaware of palpitations.  She does note some occasional leg swelling, particularly with increased sodium in her foods.  She presents for your evaluation.  Past Medical History  Diagnosis Date  . Hypertension   . IBS (irritable bowel syndrome)   . Anxiety   . Depression   . Complication of anesthesia     difficult waking up "  . Chronic kidney disease     cyst left kidney  . GERD (gastroesophageal reflux disease)   . Heart murmur     as child  . Hemorrhoids     will occ bleed      Past Surgical History  Procedure Laterality Date  . Abdominal hysterectomy  2003  . Tonsillectomy  1962  . Tubal ligation    . Coronary artery bypass graft  05/26/2011    Procedure: CORONARY ARTERY BYPASS GRAFTING (CABG);  Surgeon: Ivin Poot, MD;  Location: [place];  Service: Open Heart Surgery;  Laterality: N/A;  Coronary Artery Bypass Graft on Pump times two utlizing left internal mammary artery and right greater saphenous vein harvested endoscopically   . Left heart catheterization with coronary angiogram N/A 05/24/2011    Procedure: LEFT HEART CATHETERIZATION WITH CORONARY ANGIOGRAM;  Surgeon: Leonie Man, MD;  Location[facility] CATH LAB;  Service: Cardiovascular;  Laterality: N/A;  . Cardiac catheterization  05/22/2011  . Coronary artery bypass graft      done by Dr. Kerby Less    Allergies  Allergen Reactions  . Amlodipine Swelling  . Cetirizine & Related Other (See Comments)    Extreme Drowsiness - "knocks me loopy for 2 days"  . Levofloxacin Nausea And Vomiting  . Lisinopril Other (See Comments)    Excessive mucus production  . Prednisone Other (See Comments)    High dose - hallucinations  . Latex Rash    Current Outpatient Prescriptions  Medication Sig Dispense Refill  . albuterol (PROVENTIL HFA;VENTOLIN HFA) 108 (  90 BASE) MCG/ACT inhaler Inhale 2 puffs into the lungs every 6 (six) hours as needed for wheezing or shortness of breath. 1 Inhaler 0  . aspirin 81 MG tablet Take 81 mg by mouth daily.    Marland Kitchen atorvastatin (LIPITOR) 10 MG tablet Take 1 tablet (10 mg total) by mouth daily. <PLEASE MAKE APPOINTMENT FOR REFILLS> 30 tablet 1  . citalopram (CELEXA) 20 MG tablet Take 10 mg by mouth daily.    . cyclobenzaprine (FLEXERIL) 10 MG tablet Take 10 mg by mouth as needed for muscle spasms.    . Estradiol (VAGIFEM) 10 MCG TABS vaginal tablet Place 10 mcg vaginally 2 (two) times a week.    . pantoprazole (PROTONIX) 40 MG tablet Take 40 mg by mouth as needed.     . nebivolol  (BYSTOLIC) 2.5 MG tablet Take 1 tablet (2.5 mg total) by mouth daily. 30 tablet 6   No current facility-administered medications for this visit.    Social History   Social History  . Marital Status: Divorced    Spouse Name: N/A  . Number of Children: N/A  . Years of Education: N/A   Occupational History  . Not on file.   Social History Main Topics  . Smoking status: Former Smoker    Quit date: 05/23/2006  . Smokeless tobacco: Never Used  . Alcohol Use: 0.0 oz/week    0 Standard drinks or equivalent per week     Comment: rare glass of wine  . Drug Use: No  . Sexual Activity: Not Currently    Birth Control/ Protection: Post-menopausal   Other Topics Concern  . Not on file   Social History Narrative    Family History  Problem Relation Age of Onset  . Stroke Mother   . Diabetes Mother     history of diabetes  . Heart failure Mother   . Cancer Father 18    bone cancer  . Drug abuse Sister   . Cancer Brother     throat  . Alcohol abuse Sister   . Colon cancer Neg Hx     ROS General: Negative; No fevers, chills, or night sweats HEENT: Negative; No changes in vision or hearing, sinus congestion, difficulty swallowing Pulmonary: Negative; No cough, wheezing, shortness of breath, hemoptysis Cardiovascular: See HPI: No chest pain, presyncope, syncope, palpatations GI: Negative; No nausea, vomiting, diarrhea, or abdominal pain GU: Negative; No dysuria, hematuria, or difficulty voiding Musculoskeletal: Positive for occasional muscle spasm Hematologic: Negative; no easy bruising, bleeding Endocrine: Negative; no heat/cold intolerance; no diabetes, Neuro: Negative; no changes in balance, headaches Skin: Negative; No rashes or skin lesions Psychiatric: Negative; No behavioral problems, depression Sleep: Negative; No snoring,  daytime sleepiness, hypersomnolence, bruxism, restless legs, hypnogognic hallucinations. Other comprehensive 14 point system review is  negative   Physical Exam BP 138/80 mmHg  Pulse 53  Ht 5' 3.5" (1.613 m)  Wt 162 lb 3.2 oz (73.573 kg)  BMI 28.28 kg/m2   Wt Readings from Last 3 Encounters:  11/18/14 162 lb 3.2 oz (73.573 kg)  09/03/14 158 lb (71.668 kg)  08/31/14 159 lb (72.122 kg)   General: Alert, oriented, no distress.  Skin: normal turgor, no rashes, warm and dry HEENT: Normocephalic, atraumatic. Pupils equal round and reactive to light; sclera anicteric; extraocular muscles intact, No lid lag; Nose without nasal septal hypertrophy; Mouth/Parynx benign; Mallinpatti scale 2 Neck: No JVD, no carotid bruits; normal carotid upstroke Lungs: clear to ausculatation and percussion bilaterally; no wheezing or rales, normal inspiratory and  expiratory effort Chest wall: without tenderness to palpitation Heart: PMI not displaced, RRR, s1 s2 normal, 1/6 systolic murmur, No diastolic murmur, no rubs, gallops, thrills, or heaves Abdomen: soft, nontender; no hepatosplenomehaly, BS+; abdominal aorta nontender and not dilated by palpation. Back: no CVA tenderness Pulses: 2+  Musculoskeletal: full range of motion, normal strength, no joint deformities Extremities: Pulses 2+, no clubbing cyanosis or edema, Homan's sign negative  Neurologic: grossly nonfocal; Cranial nerves grossly wnl Psychologic: Normal mood and affect  ECG (independently read by me): Sinus bradycardia 53 bpm.  No ectopy.  Normal intervals.  August 2015 ECG (independently read by me): Sinus bradycardia at 47 beats per minute.  Nonspecific T changes.  QTc interval   385 ms  LABS: BMP Latest Ref Rng 11/18/2014 11/20/2012 09/27/2011  Glucose 65 - 99 mg/dL 94 89 139(H)  BUN 7 - 25 mg/dL $Remove'14 15 15  'IPNGhSZ$ Creatinine 0.50 - 0.99 mg/dL 0.69 0.75 0.65  Sodium 135 - 146 mmol/L 142 137 142  Potassium 3.5 - 5.3 mmol/L 4.2 4.4 3.8  Chloride 98 - 110 mmol/L 105 107 106  CO2 20 - 31 mmol/L $RemoveB'27 27 27  'kRFOoLIw$ Calcium 8.6 - 10.4 mg/dL 8.6 9.0 9.2   Hepatic Function Latest Ref Rng  11/18/2014 05/23/2011 01/24/2009  Total Protein 6.1 - 8.1 g/dL 6.2 6.3 6.6  Albumin 3.6 - 5.1 g/dL 4.1 3.7 3.8  AST 10 - 35 U/L $Remo'13 13 19  'wNnhO$ ALT 6 - 29 U/L $Remo'12 10 11  'ciAFT$ Alk Phosphatase 33 - 130 U/L 73 74 76  Total Bilirubin 0.2 - 1.2 mg/dL 0.5 0.3 0.4   CBC Latest Ref Rng 11/18/2014 11/20/2012 09/27/2011  WBC 4.0 - 10.5 K/uL 5.8 6.0 7.4  Hemoglobin 12.0 - 15.0 g/dL 14.6 13.9 12.5  Hematocrit 36.0 - 46.0 % 42.3 41.1 38.2  Platelets 150 - 400 K/uL 410(H) 368 339   Lab Results  Component Value Date   MCV 82.3 11/18/2014   MCV 82.4 11/20/2012   MCV 77.8* 09/27/2011   Lab Results  Component Value Date   TSH 1.394 11/18/2014   Lab Results  Component Value Date   HGBA1C 5.8* 05/25/2011   Lipid Panel     Component Value Date/Time   CHOL 147 11/18/2014 0928   TRIG 77 11/18/2014 0928   HDL 63 11/18/2014 0928   CHOLHDL 2.3 11/18/2014 0928   VLDL 15 11/18/2014 0928   LDLCALC 69 11/18/2014 0928   RADIOLOGY: No results found.    ASSESSMENT AND PLAN: Ms. Graylee Arutyunyan is a very pleasant 64 year old female, who is now 3-1/2 years status post CABG revascularization surgery after she was found to have high-grade left main stenosis.  A  2014 nuclear study showed normal perfusion and hyperdynamic LV function.  Previously she had been on losartan and beta blocker therapy.  Recently, she had issues with diarrhea and she felt this was contributed by losartan.  Remotely, she had been on carvedilol.  She believes the metoprolol may have contributed to some sinus problems.  Her blood pressure on repeat today was 140/80.  She has taken the metoprolol for the past 3 days.  I recommended she discontinue this and have given her samples of low-dose Bystolic 2.5 mg.  She has been on atorvastatin 10 mg.  She was fasting today and laboratory drawn today reveals an LDL of 69, which is excellent.  Her renal function and LFTs are normal.  We talked about sodium and its effect on file retention and lower extremity edema.  I  am scheduling her for a 2-D echo Doppler study for 3-1/2 year follow-up evaluation of systolic and diastolic function.  I will see her in 6 weeks for reevaluation.  Time spent: 25 minutes Troy Sine, MD, [facility]  11/21/2014 11:03 AM

## 2014-11-23 ENCOUNTER — Encounter: Payer: Self-pay | Admitting: *Deleted

## 2014-11-26 ENCOUNTER — Ambulatory Visit (HOSPITAL_COMMUNITY): Payer: Medicare HMO | Attending: Cardiology

## 2014-11-26 ENCOUNTER — Other Ambulatory Visit: Payer: Self-pay

## 2014-11-26 DIAGNOSIS — I071 Rheumatic tricuspid insufficiency: Secondary | ICD-10-CM | POA: Diagnosis not present

## 2014-11-26 DIAGNOSIS — E785 Hyperlipidemia, unspecified: Secondary | ICD-10-CM | POA: Insufficient documentation

## 2014-11-26 DIAGNOSIS — Z951 Presence of aortocoronary bypass graft: Secondary | ICD-10-CM | POA: Diagnosis not present

## 2014-11-26 DIAGNOSIS — Z87891 Personal history of nicotine dependence: Secondary | ICD-10-CM | POA: Diagnosis not present

## 2014-11-26 DIAGNOSIS — I10 Essential (primary) hypertension: Secondary | ICD-10-CM | POA: Insufficient documentation

## 2014-11-26 DIAGNOSIS — I2510 Atherosclerotic heart disease of native coronary artery without angina pectoris: Secondary | ICD-10-CM | POA: Diagnosis not present

## 2014-12-19 ENCOUNTER — Other Ambulatory Visit: Payer: Self-pay | Admitting: Family Medicine

## 2014-12-28 ENCOUNTER — Telehealth: Payer: Self-pay | Admitting: Cardiovascular Disease

## 2014-12-28 NOTE — Telephone Encounter (Signed)
Mrs.Consuegra is calling to get the results of her Echo . Please call

## 2014-12-28 NOTE — Telephone Encounter (Signed)
Echo results explained to patient, she is aware to call for any needs. Expressed thanks for the phone call.

## 2014-12-30 ENCOUNTER — Ambulatory Visit: Payer: Medicare HMO | Admitting: Cardiovascular Disease

## 2015-01-13 ENCOUNTER — Telehealth: Payer: Self-pay | Admitting: Family Medicine

## 2015-01-13 NOTE — Telephone Encounter (Signed)
Pt reported

## 2015-02-12 ENCOUNTER — Telehealth: Payer: Self-pay | Admitting: Family Medicine

## 2015-02-18 ENCOUNTER — Encounter: Payer: Self-pay | Admitting: Family Medicine

## 2015-02-18 ENCOUNTER — Ambulatory Visit (INDEPENDENT_AMBULATORY_CARE_PROVIDER_SITE_OTHER): Payer: Medicare HMO | Admitting: Family Medicine

## 2015-02-18 VITALS — BP 129/76 | HR 61 | Temp 97.3°F | Ht 63.5 in | Wt 169.0 lb

## 2015-02-18 DIAGNOSIS — I2510 Atherosclerotic heart disease of native coronary artery without angina pectoris: Secondary | ICD-10-CM

## 2015-02-18 DIAGNOSIS — I10 Essential (primary) hypertension: Secondary | ICD-10-CM | POA: Diagnosis not present

## 2015-02-18 NOTE — Progress Notes (Signed)
Subjective:    Patient ID: [name] [name], female    DOB: [birth year], 65 y.o.   MRN: [id number]  HPI 65 year old female here to follow-up hypertension, hyperlipidemia,. Today she has 2 other concerns: #1 her ears pop and there is decrease hearing. She does have some allergies and mild congestion. She also has some concerns over several areas on her chest and back that she has called moles.  Patient Active Problem List   Diagnosis Date Noted  . Essential hypertension 11/21/2014  . Hyperlipidemia LDL goal <70 11/21/2014  . Bradycardia 10/10/2013  . Dyspnea 11/20/2012  . Chest pain 11/20/2012  . S/P CABG x 2 06/19/2011  . CAD (coronary artery disease) CABG X 2 05/26/11 05/27/2011  . Abnormal screening cardiac CT, > 50% LMCA; Confirmed by Cardiac Cath 60-70% lesion 05/24/2011  . Unstable angina ([place]) 05/23/2011  . Anxiety 05/23/2011  . Depression 05/23/2011  . Renal cyst 05/23/2011  . History of smoking, quit 2008 05/23/2011   Outpatient Encounter Prescriptions as of 02/18/2015  Medication Sig  . albuterol (PROVENTIL HFA;VENTOLIN HFA) 108 (90 BASE) MCG/ACT inhaler Inhale 2 puffs into the lungs every 6 (six) hours as needed for wheezing or shortness of breath.  Marland Kitchen aspirin 81 MG tablet Take 81 mg by mouth daily.  Marland Kitchen atorvastatin (LIPITOR) 10 MG tablet Take 1 tablet (10 mg total) by mouth daily. <PLEASE MAKE APPOINTMENT FOR REFILLS>  . cyclobenzaprine (FLEXERIL) 10 MG tablet Take 10 mg by mouth as needed for muscle spasms.  . Estradiol (VAGIFEM) 10 MCG TABS vaginal tablet Place 10 mcg vaginally 2 (two) times a week.  . losartan (COZAAR) 25 MG tablet Take 25 mg by mouth daily.  . pantoprazole (PROTONIX) 40 MG tablet Take 40 mg by mouth as needed.   . [DISCONTINUED] citalopram (CELEXA) 20 MG tablet TAKE ONE TABLET BY MOUTH ONCE DAILY  . [DISCONTINUED] nebivolol (BYSTOLIC) 2.5 MG tablet Take 1 tablet (2.5 mg total) by mouth daily.   No facility-administered encounter medications on file as of  02/18/2015.      Review of Systems  Constitutional: Negative.   HENT: Positive for hearing loss.   Respiratory: Negative.   Cardiovascular: Negative.   Neurological: Negative.   Psychiatric/Behavioral: Negative.        Objective:   Physical Exam  Constitutional: She appears well-developed and well-nourished.  HENT:  Left Ear: External ear normal.  Mouth/Throat: No oropharyngeal exudate.  Right tympanic membrane is somewhat dull compared to the left but both move well with Valsalva maneuver.  Cardiovascular: Normal rate.   Pulmonary/Chest: Effort normal and breath sounds normal.  Skin:  Examination of her chest and back show several keratosis on the back. One of concern is underneath the bra and may be irritated because of that location. There is no infection or inflammation but a scab over top of the keratosis. On her chest there is a hemangioma, not a mole or keratosis. She was reassured and we will follow these areas over time.          Assessment & Plan:  1. Coronary artery disease involving native coronary artery of native heart without angina pectoris Status post CABG. No complaints of chest pain.  2. Essential hypertension She could not afford Bystolic and changed back to losartan 25 mg. Blood pressure is well controlled today. Recommend continue same. She has been on that about a year now even though she had an allergy to lisinopril with swelling in her throat.  Wardell Honour MD

## 2015-03-09 ENCOUNTER — Other Ambulatory Visit: Payer: Self-pay | Admitting: Family Medicine

## 2015-03-16 ENCOUNTER — Ambulatory Visit: Payer: Medicare HMO | Admitting: Family

## 2015-03-17 ENCOUNTER — Encounter: Payer: Self-pay | Admitting: Family Medicine

## 2015-03-24 LAB — HM MAMMOGRAPHY: HM Mammogram: NEGATIVE

## 2015-03-31 ENCOUNTER — Telehealth: Payer: Self-pay | Admitting: Family Medicine

## 2015-04-02 ENCOUNTER — Encounter: Payer: Self-pay | Admitting: *Deleted

## 2015-04-08 ENCOUNTER — Ambulatory Visit (INDEPENDENT_AMBULATORY_CARE_PROVIDER_SITE_OTHER): Payer: Medicare HMO | Admitting: Pediatrics

## 2015-04-08 ENCOUNTER — Encounter: Payer: Self-pay | Admitting: Pediatrics

## 2015-04-08 VITALS — BP 149/72 | HR 54 | Temp 97.2°F | Ht 63.5 in | Wt 168.4 lb

## 2015-04-08 DIAGNOSIS — Z6829 Body mass index (BMI) 29.0-29.9, adult: Secondary | ICD-10-CM | POA: Diagnosis not present

## 2015-04-08 DIAGNOSIS — R42 Dizziness and giddiness: Secondary | ICD-10-CM | POA: Diagnosis not present

## 2015-04-08 DIAGNOSIS — H65112 Acute and subacute allergic otitis media (mucoid) (sanguinous) (serous), left ear: Secondary | ICD-10-CM

## 2015-04-08 DIAGNOSIS — R7303 Prediabetes: Secondary | ICD-10-CM | POA: Diagnosis not present

## 2015-04-08 LAB — POCT GLYCOSYLATED HEMOGLOBIN (HGB A1C): Hemoglobin A1C: 6

## 2015-04-08 MED ORDER — MECLIZINE HCL 12.5 MG PO TABS: 12.5000 mg | ORAL_TABLET | Freq: Three times a day (TID) | ORAL | Status: DC | PRN

## 2015-04-08 MED ORDER — AMOXICILLIN 500 MG PO CAPS
500.0000 mg | ORAL_CAPSULE | Freq: Three times a day (TID) | ORAL | Status: DC
Start: 2015-04-08 — End: 2015-06-14

## 2015-04-08 NOTE — Progress Notes (Signed)
    Subjective:    Patient ID: [name] [name], female    DOB: 10-Sep-1950, 65 y.o.   MRN: [id number]  CC: Dizziness; Nausea; and Emesis   HPI: [name] [name] is a 65 y.o. female presenting for Dizziness; Nausea; and Emesis  Started getting dizzy three days ago Had one episode of vomiting, now just nauseous with the episodes Feels very congested and ears are stopped up Tried equate nasal spray, had lots of green discharge, got better after that but still with    Depression screen [facility] 2/9 04/08/2015 02/18/2015 08/31/2014 08/21/2014 07/01/2014  Decreased Interest 0 0 0 0 0  Down, Depressed, Hopeless 0 0 0 0 0  PHQ - 2 Score 0 0 0 0 0     Relevant past medical, surgical, family and social history reviewed and updated as indicated. Interim medical history since our last visit reviewed. Allergies and medications reviewed and updated.    ROS: Per HPI unless specifically indicated above  History  Smoking status  . Former Smoker  . Quit date: 05/23/2006  Smokeless tobacco  . Never Used    Past Medical History Patient Active Problem List   Diagnosis Date Noted  . BMI 29.0-29.9,adult 04/08/2015  . Essential hypertension 11/21/2014  . Hyperlipidemia LDL goal <70 11/21/2014  . Bradycardia 10/10/2013  . Dyspnea 11/20/2012  . Chest pain 11/20/2012  . S/P CABG x 2 06/19/2011  . CAD (coronary artery disease) CABG X 2 05/26/11 05/27/2011  . Abnormal screening cardiac CT, > 50% LMCA; Confirmed by Cardiac Cath 60-70% lesion 05/24/2011  . Unstable angina ([place]) 05/23/2011  . Anxiety 05/23/2011  . Depression 05/23/2011  . Renal cyst 05/23/2011  . History of smoking, quit 2008 05/23/2011        Objective:    BP 149/72 mmHg  Pulse 54  Temp(Src) 97.2 F (36.2 C) (Oral)  Ht 5' 3.5" (1.613 m)  Wt 168 lb 6.4 oz (76.386 kg)  BMI 29.36 kg/m2  Wt Readings from Last 3 Encounters:  04/08/15 168 lb 6.4 oz (76.386 kg)  02/18/15 169 lb (76.658 kg)  11/18/14 162 lb 3.2 oz (73.573 kg)    Gen:  NAD, alert, cooperative with exam, NCAT EYES: EOMI, no scleral injection or icterus ENT:  R TM red, splayed LR, opaque, L TM with opaque fluid layered at base of canal behind the membrane. OP without erythema LYMPH: no cervical LAD CV: NRRR, normal S1/S2, no murmur, distal pulses 2+ b/l Resp: CTABL, no wheezes, normal WOB Abd: +BS, soft, NTND.  Ext: No edema, warm Neuro: Alert and oriented     Assessment & Plan:    [name] [name] was seen today for dizziness, nausea and emesis, getting better, just feels stopped up today, no dizziness. Amoxicillin for AOM.  Diagnoses and all orders for this visit:  Acute mucoid otitis media of left ear -     amoxicillin (AMOXIL) 500 MG capsule; Take 1 capsule (500 mg total) by mouth 3 (three) times daily.  Vertigo -     meclizine (ANTIVERT) 12.5 MG tablet; Take 1 tablet (12.5 mg total) by mouth 3 (three) times daily as needed for dizziness.  Pre-diabetes -     POCT glycosylated hemoglobin (Hb A1C)  BMI 29.0-29.9,adult Discussed lifestyle changes, increasing vegetables in each meal   Follow up plan: Return in about 2 weeks (around 04/22/2015), or if symptoms worsen or fail to improve.  Assunta Found, MD [facility] 04/08/2015, 8:31 AM

## 2015-04-16 ENCOUNTER — Other Ambulatory Visit: Payer: Self-pay | Admitting: Cardiovascular Disease

## 2015-04-16 MED ORDER — LOSARTAN POTASSIUM 25 MG PO TABS: 25.0000 mg | ORAL_TABLET | Freq: Every day | ORAL | Status: DC

## 2015-05-26 ENCOUNTER — Other Ambulatory Visit: Payer: Self-pay

## 2015-05-26 MED ORDER — LOSARTAN POTASSIUM 25 MG PO TABS: 25.0000 mg | ORAL_TABLET | Freq: Every day | ORAL | Status: DC

## 2015-06-14 ENCOUNTER — Ambulatory Visit (INDEPENDENT_AMBULATORY_CARE_PROVIDER_SITE_OTHER): Payer: Medicare HMO | Admitting: Family Medicine

## 2015-06-14 ENCOUNTER — Encounter: Payer: Self-pay | Admitting: Family Medicine

## 2015-06-14 VITALS — BP 119/69 | HR 57 | Temp 97.8°F | Ht 63.5 in | Wt 168.0 lb

## 2015-06-14 DIAGNOSIS — I10 Essential (primary) hypertension: Secondary | ICD-10-CM

## 2015-06-14 DIAGNOSIS — D485 Neoplasm of uncertain behavior of skin: Secondary | ICD-10-CM

## 2015-06-14 DIAGNOSIS — F329 Major depressive disorder, single episode, unspecified: Secondary | ICD-10-CM

## 2015-06-14 DIAGNOSIS — R7303 Prediabetes: Secondary | ICD-10-CM | POA: Diagnosis not present

## 2015-06-14 DIAGNOSIS — F32A Depression, unspecified: Secondary | ICD-10-CM

## 2015-06-14 DIAGNOSIS — L989 Disorder of the skin and subcutaneous tissue, unspecified: Secondary | ICD-10-CM

## 2015-06-14 MED ORDER — ESTRADIOL 10 MCG VA TABS: 10.0000 ug | ORAL_TABLET | VAGINAL | Status: DC

## 2015-06-14 MED ORDER — PANTOPRAZOLE SODIUM 40 MG PO TBEC: 40.0000 mg | DELAYED_RELEASE_TABLET | ORAL | Status: DC | PRN

## 2015-06-14 MED ORDER — ATORVASTATIN CALCIUM 10 MG PO TABS: 10.0000 mg | ORAL_TABLET | Freq: Every day | ORAL | Status: DC

## 2015-06-14 MED ORDER — ALBUTEROL SULFATE HFA 108 (90 BASE) MCG/ACT IN AERS: 2.0000 | INHALATION_SPRAY | Freq: Four times a day (QID) | RESPIRATORY_TRACT | Status: DC | PRN

## 2015-06-14 MED ORDER — LOSARTAN POTASSIUM 25 MG PO TABS: 25.0000 mg | ORAL_TABLET | Freq: Every day | ORAL | Status: DC

## 2015-06-14 NOTE — Patient Instructions (Signed)
Continue current medications. Continue good therapeutic lifestyle changes which include good diet and exercise. Fall precautions discussed with patient. If an FOBT was given today- please return it to our front desk. If you are over 64 years old - you may need Prevnar 13 or the adult Pneumonia vaccine.  **Flu shots are available--- please call and schedule a FLU-CLINIC appointment**  After your visit with us today you will receive a survey in the mail or online from [employer] regarding your care with us. Please take a moment to fill this out. Your feedback is very important to us as you can help us better understand your patient needs as well as improve your experience and satisfaction. WE CARE ABOUT YOU!!!    

## 2015-06-14 NOTE — Progress Notes (Signed)
Subjective:    Patient ID: [name] [name], female    DOB: February 10, 1951, 65 y.o.   MRN: [id number]  HPI Pt here for follow up and management of chronic medical problems which includes hypertension and depression. She is taking medications regularly. She stopped citalopram because she thought it made her sleepy. Apparently she has been on antidepressants for some time. Previously she had taken Wellbutrin and would like to try that one again. She also complains of some dependent edema. She still has ear symptoms. There is popping and pressure but she is able to "clear" her years with Valsalva. There is also a small lesion on her back that has changed color that she would like to have removed today.     Patient Active Problem List   Diagnosis Date Noted  . BMI 29.0-29.9,adult 04/08/2015  . Essential hypertension 11/21/2014  . Hyperlipidemia LDL goal <70 11/21/2014  . Bradycardia 10/10/2013  . Dyspnea 11/20/2012  . Chest pain 11/20/2012  . S/P CABG x 2 06/19/2011  . CAD (coronary artery disease) CABG X 2 05/26/11 05/27/2011  . Abnormal screening cardiac CT, > 50% LMCA; Confirmed by Cardiac Cath 60-70% lesion 05/24/2011  . Unstable angina ([place]) 05/23/2011  . Anxiety 05/23/2011  . Depression 05/23/2011  . Renal cyst 05/23/2011  . History of smoking, quit 2008 05/23/2011   Outpatient Encounter Prescriptions as of 06/14/2015  Medication Sig  . albuterol (PROVENTIL HFA;VENTOLIN HFA) 108 (90 BASE) MCG/ACT inhaler Inhale 2 puffs into the lungs every 6 (six) hours as needed for wheezing or shortness of breath.  Marland Kitchen aspirin 81 MG tablet Take 81 mg by mouth daily.  Marland Kitchen atorvastatin (LIPITOR) 10 MG tablet Take 1 tablet (10 mg total) by mouth daily. <PLEASE MAKE APPOINTMENT FOR REFILLS>  . cyclobenzaprine (FLEXERIL) 10 MG tablet Take 10 mg by mouth as needed for muscle spasms.  . Estradiol (VAGIFEM) 10 MCG TABS vaginal tablet Place 10 mcg vaginally 2 (two) times a week.  . losartan (COZAAR) 25 MG tablet  Take 1 tablet (25 mg total) by mouth daily. Please schedule appointment for 90 day supply or refills.  . pantoprazole (PROTONIX) 40 MG tablet Take 40 mg by mouth as needed.   . [DISCONTINUED] amoxicillin (AMOXIL) 500 MG capsule Take 1 capsule (500 mg total) by mouth 3 (three) times daily.  . [DISCONTINUED] citalopram (CELEXA) 20 MG tablet Take 1 tablet (20 mg total) by mouth daily.  . [DISCONTINUED] losartan (COZAAR) 25 MG tablet Take 25 mg by mouth daily.  . [DISCONTINUED] meclizine (ANTIVERT) 12.5 MG tablet Take 1 tablet (12.5 mg total) by mouth 3 (three) times daily as needed for dizziness.   No facility-administered encounter medications on file as of 06/14/2015.      Review of Systems  Constitutional: Negative.   HENT: Positive for ear pain (ENT appt soon).   Eyes: Negative.   Respiratory: Negative.   Cardiovascular: Positive for leg swelling (bilateral ankle swelling).  Gastrointestinal: Negative.   Endocrine: Negative.   Genitourinary: Negative.   Musculoskeletal: Negative.   Skin: Negative.        Lesion on back  Allergic/Immunologic: Negative.   Neurological: Negative.   Hematological: Negative.   Psychiatric/Behavioral: Negative.        Depression - not currently on meds       Objective:   Physical Exam  Constitutional: She appears well-developed and well-nourished.  HENT:  Mouth/Throat: Oropharynx is clear and moist.  Eardrums are dull but mobile  Cardiovascular: Normal rate and regular rhythm.  Pulmonary/Chest: Effort normal and breath sounds normal.  Skin:  Small mole on her back under the bra strap. Shave excision was performed today after anesthetizing with 1% lidocaine prepped with Betadine. Hemostasis obtained with Monsel solution. There were no complications or problems will send tissue specimen for analysis   BP 119/69 mmHg  Pulse 57  Temp(Src) 97.8 F (36.6 C) (Oral)  Ht 5' 3.5" (1.613 m)  Wt 168 lb (76.204 kg)  BMI 29.29 kg/m2          Assessment & Plan:  1. Essential hypertension Blood pressure is well controlled  2. Pre-diabetes A1c on 223 was 6.0. This indicates some elevation of her sugar. Stressed again the importance of watching carbs and diet  3. Depression Again Wellbutrin 150 mg 4 days then titrate up to 300 mg  4. Skin lesion of back Lesion removed as described above - Pathology Report Wardell Honour MD

## 2015-06-16 LAB — PATHOLOGY

## 2015-06-17 ENCOUNTER — Other Ambulatory Visit: Payer: Self-pay | Admitting: *Deleted

## 2015-06-17 MED ORDER — BUPROPION HCL ER (XL) 150 MG PO TB24: 150.0000 mg | ORAL_TABLET | Freq: Every day | ORAL | Status: DC

## 2015-06-18 ENCOUNTER — Other Ambulatory Visit: Payer: Self-pay | Admitting: *Deleted

## 2015-06-18 MED ORDER — BUPROPION HCL ER (XL) 300 MG PO TB24: 300.0000 mg | ORAL_TABLET | Freq: Every day | ORAL | Status: DC

## 2015-06-22 ENCOUNTER — Telehealth: Payer: Self-pay

## 2015-06-22 NOTE — Telephone Encounter (Signed)
x

## 2015-06-22 NOTE — Telephone Encounter (Signed)
Insurance prior authorized Bupropion XL 2 daily through 02/13/16

## 2015-08-16 ENCOUNTER — Ambulatory Visit: Payer: Medicare HMO | Admitting: Family Medicine

## 2015-08-26 ENCOUNTER — Encounter: Payer: Self-pay | Admitting: Family Medicine

## 2015-08-26 ENCOUNTER — Ambulatory Visit (INDEPENDENT_AMBULATORY_CARE_PROVIDER_SITE_OTHER): Payer: PPO | Admitting: Family Medicine

## 2015-08-26 VITALS — BP 141/64 | HR 60 | Temp 98.3°F | Ht 63.5 in | Wt 165.2 lb

## 2015-08-26 DIAGNOSIS — R350 Frequency of micturition: Secondary | ICD-10-CM | POA: Diagnosis not present

## 2015-08-26 DIAGNOSIS — I878 Other specified disorders of veins: Secondary | ICD-10-CM | POA: Diagnosis not present

## 2015-08-26 DIAGNOSIS — N952 Postmenopausal atrophic vaginitis: Secondary | ICD-10-CM | POA: Diagnosis not present

## 2015-08-26 MED ORDER — ESTROGENS, CONJUGATED 0.625 MG/GM VA CREA: 1.0000 | TOPICAL_CREAM | VAGINAL | Status: DC

## 2015-08-26 MED ORDER — PANTOPRAZOLE SODIUM 40 MG PO TBEC: 40.0000 mg | DELAYED_RELEASE_TABLET | ORAL | Status: DC | PRN

## 2015-08-26 NOTE — Progress Notes (Signed)
   HPI  Patient presents today here with urinary frequency and several other complaints.  Urinary frequency Patient splinted over the last week or 2 she's had urinary frequency, intermittent abdominal pain, and right-sided flank pain. The flank pain is dull and achy and does not radiate. She denies any fever, dysuria, or foul-smelling urine. She's very alarmed because she had an accident on Saturday, about 4 days ago, when she could not make it to the bathroom and had a small amount of urination mouth she was at a store.   Atrophic vaginitis She cannot afford Vagifem would like to change to Premarin. She describes intermittent symptoms associated with atrophic vaginitis.  She describes bilateral leg swelling that's been intermittent. She requests fluid pill for this. She also states that she developed blisters on her feet about 3 months ago at the beach, they are almost healed but have still been a little bit irritating and slow to be healed.   Also states she's had midepigastric abdominal pain intermittently, this responds to antacids for short time and then returns. She's tolerating food and fluids normally. Would like refill of ppi   PMH: Smoking status noted ROS: Per HPI  Objective: BP 141/64 mmHg  Pulse 60  Temp(Src) 98.3 F (36.8 C) (Oral)  Ht 5' 3.5" (1.613 m)  Wt 165 lb 3.2 oz (74.934 kg)  BMI 28.80 kg/m2 Gen: NAD, alert, cooperative with exam HEENT: NCAT CV: RRR, good S1/S2, no murmur Resp: CTABL, no wheezes, non-labored Abd: Mild tenderness to palpation throughout Ext: Trace to 1+ pitting edema bilateral lower extremities which is symmetric Neuro: Alert and oriented, No gross deficits  Skin Circular areas of peeling skin on bilateral heels consistent with healed blisters.  Assessment and plan:  # Urinary frequency Urinalysis is not consistent with UTI Center culture Reassurance provided, likely more related to atrophic vaginitis  # Atrophic  vaginitis Switch to Premarin cream vaginally twice weekly. Consider using vaginal tablets if needed.  # Venous stasis Prescription written for compression stockings today  Persistent, stable    Orders Placed This Encounter  Procedures  . Urine culture  . Urinalysis    Meds ordered this encounter  Medications  . conjugated estrogens (PREMARIN) vaginal cream    Sig: Place 1 Applicatorful vaginally 2 (two) times a week.    Dispense:  30 g    Refill:  [place], MD [facility] 08/26/2015, 6:34 PM

## 2015-08-26 NOTE — Patient Instructions (Signed)
Great to meet you!  I have sent premarin cream to your pharmacy to replace vagifem  Try the compression stockings  We will follow up on the urine culture when it is available (often 3-4 days)

## 2015-08-27 ENCOUNTER — Other Ambulatory Visit: Payer: Self-pay

## 2015-08-27 LAB — URINALYSIS
Bilirubin, UA: NEGATIVE
Glucose, UA: NEGATIVE
Ketones, UA: NEGATIVE
Leukocytes, UA: NEGATIVE
Nitrite, UA: NEGATIVE
Protein, UA: NEGATIVE
Specific Gravity, UA: >1.03 — ABNORMAL HIGH (ref 1.005–1.030)
Urobilinogen, Ur: 0.2 mg/dL (ref 0.2–1.0)
pH, UA: 5 (ref 5.0–7.5)

## 2015-08-27 MED ORDER — ESTROGENS, CONJUGATED 0.625 MG/GM VA CREA: 1.0000 | TOPICAL_CREAM | VAGINAL | Status: DC

## 2015-08-28 LAB — URINE CULTURE

## 2015-10-14 NOTE — Progress Notes (Signed)
Subjective:    Patient ID: [name] [name], female    DOB: 02/18/1950, 65 y.o.   MRN: [id number]  HPI 65 year old female with history of atherosclerotic heart disease. She has had no recent chest pain. Her main concern today is fatigue. She falls asleep easily in the daytime. There is a history of snoring and we talked about the possibility of sleep apnea. She also complains of some myalgias. Discussed holding statin for several weeks to see if that might help the myalgias.  Patient Active Problem List   Diagnosis Date Noted  . Atrophic vaginitis 08/26/2015  . BMI 29.0-29.9,adult 04/08/2015  . Essential hypertension 11/21/2014  . Hyperlipidemia LDL goal <70 11/21/2014  . Bradycardia 10/10/2013  . Dyspnea 11/20/2012  . Chest pain 11/20/2012  . S/P CABG x 2 06/19/2011  . CAD (coronary artery disease) CABG X 2 05/26/11 05/27/2011  . Abnormal screening cardiac CT, > 50% LMCA; Confirmed by Cardiac Cath 60-70% lesion 05/24/2011  . Unstable angina ([place]) 05/23/2011  . Anxiety 05/23/2011  . Depression 05/23/2011  . Renal cyst 05/23/2011  . History of smoking, quit 2008 05/23/2011   Outpatient Encounter Prescriptions as of 10/15/2015  Medication Sig  . albuterol (PROVENTIL HFA;VENTOLIN HFA) 108 (90 Base) MCG/ACT inhaler Inhale 2 puffs into the lungs every 6 (six) hours as needed for wheezing or shortness of breath.  Marland Kitchen aspirin 81 MG tablet Take 81 mg by mouth daily.  Marland Kitchen atorvastatin (LIPITOR) 10 MG tablet Take 1 tablet (10 mg total) by mouth daily.  Marland Kitchen conjugated estrogens (PREMARIN) vaginal cream Place 1 Applicatorful vaginally 2 (two) times a week.  . cyclobenzaprine (FLEXERIL) 10 MG tablet Take 10 mg by mouth as needed for muscle spasms.  Marland Kitchen losartan (COZAAR) 25 MG tablet Take 1 tablet (25 mg total) by mouth daily. Please schedule appointment for 90 day supply or refills.  . pantoprazole (PROTONIX) 40 MG tablet Take 1 tablet (40 mg total) by mouth as needed.   No facility-administered encounter  medications on file as of 10/15/2015.       Review of Systems  Constitutional: Positive for fatigue.  Respiratory: Negative.   Cardiovascular: Negative.   Neurological: Negative.   Psychiatric/Behavioral: Negative.        Objective:   Physical Exam  Constitutional: She is oriented to person, place, and time. She appears well-developed and well-nourished.  Cardiovascular: Normal rate, regular rhythm, normal heart sounds and intact distal pulses.   Pulmonary/Chest: Effort normal and breath sounds normal.  Neurological: She is alert and oriented to person, place, and time.  Psychiatric: She has a normal mood and affect. Her behavior is normal.   BP 127/66 (BP Location: Right Arm, Patient Position: Sitting, Cuff Size: Normal)   Pulse (!) 51   Temp 97.7 F (36.5 C) (Oral)   Ht 5' 3.5" (1.613 m)   Wt 162 lb 6.4 oz (73.7 kg)   BMI 28.32 kg/m         Assessment & Plan:  1. Essential hypertension Blood pressure is good. Continue on losartan - CMP14+EGFR  2. Hyperlipidemia LDL goal <70 Lipids were at goal when last checked approximately one year ago but we may hold statin to see if that helps muscle pain - Bayer DCA Hb A1c Waived - Lipid panel  3. Chronic fatigue We will check basic labs if all those are negative consider home sleep study as sleep apnea may be a contributor to her fatigue - CBC with Differential/Platelet - TSH  Wardell Honour MD

## 2015-10-15 ENCOUNTER — Ambulatory Visit (INDEPENDENT_AMBULATORY_CARE_PROVIDER_SITE_OTHER): Payer: PPO | Admitting: Family Medicine

## 2015-10-15 ENCOUNTER — Encounter: Payer: Self-pay | Admitting: Family Medicine

## 2015-10-15 VITALS — BP 127/66 | HR 51 | Temp 97.7°F | Ht 63.5 in | Wt 162.4 lb

## 2015-10-15 DIAGNOSIS — I10 Essential (primary) hypertension: Secondary | ICD-10-CM

## 2015-10-15 DIAGNOSIS — E785 Hyperlipidemia, unspecified: Secondary | ICD-10-CM

## 2015-10-15 DIAGNOSIS — R5382 Chronic fatigue, unspecified: Secondary | ICD-10-CM | POA: Diagnosis not present

## 2015-10-15 LAB — BAYER DCA HB A1C WAIVED: HB A1C (BAYER DCA - WAIVED): 5.6 % (ref <7.0)

## 2015-10-16 LAB — LIPID PANEL
Chol/HDL Ratio: 2.7 ratio units (ref 0.0–4.4)
Cholesterol, Total: 167 mg/dL (ref 100–199)
HDL: 62 mg/dL (ref >39)
LDL Calculated: 79 mg/dL (ref 0–99)
Triglycerides: 130 mg/dL (ref 0–149)
VLDL Cholesterol Cal: 26 mg/dL (ref 5–40)

## 2015-10-16 LAB — CMP14+EGFR
ALT: 16 IU/L (ref 0–32)
AST: 19 IU/L (ref 0–40)
Albumin/Globulin Ratio: 2.1 (ref 1.2–2.2)
Albumin: 4.4 g/dL (ref 3.6–4.8)
Alkaline Phosphatase: 80 IU/L (ref 39–117)
BUN/Creatinine Ratio: 17 (ref 12–28)
BUN: 13 mg/dL (ref 8–27)
Bilirubin Total: 0.4 mg/dL (ref 0.0–1.2)
CO2: 27 mmol/L (ref 18–29)
Calcium: 9.4 mg/dL (ref 8.7–10.3)
Chloride: 102 mmol/L (ref 96–106)
Creatinine, Ser: 0.78 mg/dL (ref 0.57–1.00)
GFR calc Af Amer: 93 mL/min/{1.73_m2} (ref >59)
GFR calc non Af Amer: 81 mL/min/{1.73_m2} (ref >59)
Globulin, Total: 2.1 g/dL (ref 1.5–4.5)
Glucose: 89 mg/dL (ref 65–99)
Potassium: 4.1 mmol/L (ref 3.5–5.2)
Sodium: 141 mmol/L (ref 134–144)
Total Protein: 6.5 g/dL (ref 6.0–8.5)

## 2015-10-16 LAB — CBC WITH DIFFERENTIAL/PLATELET
Basophils Absolute: 0 10*3/uL (ref 0.0–0.2)
Basos: 1 %
EOS (ABSOLUTE): 0.2 10*3/uL (ref 0.0–0.4)
Eos: 3 %
Hematocrit: 42.9 % (ref 34.0–46.6)
Hemoglobin: 14 g/dL (ref 11.1–15.9)
Immature Grans (Abs): 0 10*3/uL (ref 0.0–0.1)
Immature Granulocytes: 1 %
Lymphocytes Absolute: 2.8 10*3/uL (ref 0.7–3.1)
Lymphs: 44 %
MCH: 27.9 pg (ref 26.6–33.0)
MCHC: 32.6 g/dL (ref 31.5–35.7)
MCV: 86 fL (ref 79–97)
Monocytes Absolute: 0.5 10*3/uL (ref 0.1–0.9)
Monocytes: 8 %
Neutrophils Absolute: 2.7 10*3/uL (ref 1.4–7.0)
Neutrophils: 43 %
Platelets: 330 10*3/uL (ref 150–379)
RBC: 5.01 x10E6/uL (ref 3.77–5.28)
RDW: 14.5 % (ref 12.3–15.4)
WBC: 6.3 10*3/uL (ref 3.4–10.8)

## 2015-10-16 LAB — TSH: TSH: 1.99 u[IU]/mL (ref 0.450–4.500)

## 2015-12-23 ENCOUNTER — Other Ambulatory Visit: Payer: Self-pay | Admitting: Family Medicine

## 2015-12-23 ENCOUNTER — Telehealth: Payer: Self-pay | Admitting: Family Medicine

## 2015-12-23 MED ORDER — PANTOPRAZOLE SODIUM 40 MG PO TBEC
40.0000 mg | DELAYED_RELEASE_TABLET | ORAL | 1 refills | Status: DC | PRN
Start: 2015-12-23 — End: 2016-05-29

## 2015-12-23 MED ORDER — LOSARTAN POTASSIUM 25 MG PO TABS
25.0000 mg | ORAL_TABLET | Freq: Every day | ORAL | 1 refills | Status: DC
Start: 2015-12-23 — End: 2016-03-17

## 2015-12-23 NOTE — Telephone Encounter (Signed)
Patient refill done for 90 day supply , pantoprazole and losartan.

## 2015-12-27 ENCOUNTER — Telehealth: Payer: Self-pay | Admitting: Family Medicine

## 2015-12-27 MED ORDER — ATORVASTATIN CALCIUM 10 MG PO TABS: 10.0000 mg | ORAL_TABLET | Freq: Every day | ORAL | 3 refills | Status: DC

## 2015-12-27 NOTE — Telephone Encounter (Signed)
Done pt aware °

## 2016-01-12 ENCOUNTER — Ambulatory Visit: Payer: PPO

## 2016-01-26 ENCOUNTER — Encounter (INDEPENDENT_AMBULATORY_CARE_PROVIDER_SITE_OTHER): Payer: PPO | Admitting: Ophthalmology

## 2016-01-26 DIAGNOSIS — H33303 Unspecified retinal break, bilateral: Secondary | ICD-10-CM

## 2016-01-26 DIAGNOSIS — H43813 Vitreous degeneration, bilateral: Secondary | ICD-10-CM | POA: Diagnosis not present

## 2016-03-07 ENCOUNTER — Other Ambulatory Visit: Payer: Self-pay | Admitting: Obstetrics and Gynecology

## 2016-03-07 DIAGNOSIS — Z1231 Encounter for screening mammogram for malignant neoplasm of breast: Secondary | ICD-10-CM

## 2016-03-15 ENCOUNTER — Telehealth: Payer: Self-pay | Admitting: Gastroenterology

## 2016-03-15 NOTE — Telephone Encounter (Signed)
Left message for patient to call back  

## 2016-03-15 NOTE — Telephone Encounter (Signed)
I returned a call to the patient.  She wants to see her primary MD which she will do on Friday.  If he thinks it is GI related, she will have him refer her back.  She wants to wait on scheduling an appt.

## 2016-03-17 ENCOUNTER — Encounter: Payer: Self-pay | Admitting: Family Medicine

## 2016-03-17 ENCOUNTER — Ambulatory Visit (INDEPENDENT_AMBULATORY_CARE_PROVIDER_SITE_OTHER): Payer: Medicare HMO | Admitting: Family Medicine

## 2016-03-17 VITALS — BP 136/77 | HR 53 | Temp 98.1°F | Ht 63.5 in | Wt 165.2 lb

## 2016-03-17 DIAGNOSIS — R0782 Intercostal pain: Secondary | ICD-10-CM

## 2016-03-17 MED ORDER — CYCLOBENZAPRINE HCL 10 MG PO TABS: 10.0000 mg | ORAL_TABLET | ORAL | 1 refills | Status: DC | PRN

## 2016-03-17 MED ORDER — LOSARTAN POTASSIUM 25 MG PO TABS: 25.0000 mg | ORAL_TABLET | Freq: Every day | ORAL | 1 refills | Status: DC

## 2016-03-17 MED ORDER — OSELTAMIVIR PHOSPHATE 75 MG PO CAPS: 75.0000 mg | ORAL_CAPSULE | Freq: Every day | ORAL | 0 refills | Status: DC

## 2016-03-17 NOTE — Progress Notes (Signed)
Subjective:    Patient ID: [name] [name], female    DOB: 1950-10-17, 66 y.o.   MRN: [id number]  HPI 66 year old female with symptoms consistent with acid reflux started the first part of this week. She has a history of GERD and eats mostly a bland diet. The pain was described as a burning but then it there was some radiation. She started Protonix and that helped. There is no increase in pain with exertion or strenuous activity. She does have a history of bypass surgery 3-4 years ago.  Patient Active Problem List   Diagnosis Date Noted  . Atrophic vaginitis 08/26/2015  . BMI 29.0-29.9,adult 04/08/2015  . Essential hypertension 11/21/2014  . Hyperlipidemia LDL goal <70 11/21/2014  . Bradycardia 10/10/2013  . Dyspnea 11/20/2012  . Chest pain 11/20/2012  . S/P CABG x 2 06/19/2011  . CAD (coronary artery disease) CABG X 2 05/26/11 05/27/2011  . Abnormal screening cardiac CT, > 50% LMCA; Confirmed by Cardiac Cath 60-70% lesion 05/24/2011  . Unstable angina ([place]) 05/23/2011  . Anxiety 05/23/2011  . Depression 05/23/2011  . Renal cyst 05/23/2011  . History of smoking, quit 2008 05/23/2011   Outpatient Encounter Prescriptions as of 03/17/2016  Medication Sig  . albuterol (PROVENTIL HFA;VENTOLIN HFA) 108 (90 Base) MCG/ACT inhaler Inhale 2 puffs into the lungs every 6 (six) hours as needed for wheezing or shortness of breath.  Marland Kitchen aspirin 81 MG tablet Take 81 mg by mouth daily.  Marland Kitchen atorvastatin (LIPITOR) 10 MG tablet Take 1 tablet (10 mg total) by mouth daily.  Marland Kitchen conjugated estrogens (PREMARIN) vaginal cream Place 1 Applicatorful vaginally 2 (two) times a week.  . cyclobenzaprine (FLEXERIL) 10 MG tablet Take 1 tablet (10 mg total) by mouth as needed for muscle spasms.  Marland Kitchen losartan (COZAAR) 25 MG tablet Take 1 tablet (25 mg total) by mouth daily. Please schedule appointment for 90 day supply or refills.  . pantoprazole (PROTONIX) 40 MG tablet Take 1 tablet (40 mg total) by mouth as needed.  .  [DISCONTINUED] cyclobenzaprine (FLEXERIL) 10 MG tablet Take 10 mg by mouth as needed for muscle spasms.  . [DISCONTINUED] losartan (COZAAR) 25 MG tablet Take 1 tablet (25 mg total) by mouth daily. Please schedule appointment for 90 day supply or refills.   No facility-administered encounter medications on file as of 03/17/2016.       Review of Systems  Respiratory: Negative.   Cardiovascular: Positive for chest pain.  Gastrointestinal: Positive for abdominal pain.       Substernal burning  Neurological: Negative.   Psychiatric/Behavioral: Negative.        Objective:   Physical Exam  Constitutional: She is oriented to person, place, and time. She appears well-developed and well-nourished.  Cardiovascular: Normal rate, regular rhythm and normal heart sounds.   Pulmonary/Chest: Effort normal and breath sounds normal.  Abdominal: Soft. There is tenderness (epigastric area).  Neurological: She is alert and oriented to person, place, and time.  Psychiatric: She has a normal mood and affect. Her behavior is normal.   BP 136/77   Pulse (!) 53   Temp 98.1 F (36.7 C) (Oral)   Ht 5' 3.5" (1.613 m)   Wt 165 lb 3.2 oz (74.9 kg)   BMI 28.80 kg/m         Assessment & Plan:  1. Intercostal pain I think an systems are likely can consistent with reflux and probably esophageal pain. Given her history of bypass surgery performed cardiogram which shows no acute changes.  I explained that the cardiogram is useful but does not rule out heart disease and if her pain seemed to be more exertional that we should get her back to see the cardiologist. We also discussed possibility of gallbladder disease but symptoms did not suggest that so much. She will continue with Protonix.   Wardell Honour MD  - EKG 12-Lead

## 2016-03-22 DIAGNOSIS — H35371 Puckering of macula, right eye: Secondary | ICD-10-CM | POA: Diagnosis not present

## 2016-03-22 DIAGNOSIS — H43391 Other vitreous opacities, right eye: Secondary | ICD-10-CM | POA: Diagnosis not present

## 2016-03-22 DIAGNOSIS — H43813 Vitreous degeneration, bilateral: Secondary | ICD-10-CM | POA: Diagnosis not present

## 2016-03-23 ENCOUNTER — Telehealth: Payer: Self-pay

## 2016-03-23 ENCOUNTER — Ambulatory Visit: Payer: Medicare HMO | Admitting: Physician Assistant

## 2016-03-24 DIAGNOSIS — H5210 Myopia, unspecified eye: Secondary | ICD-10-CM | POA: Diagnosis not present

## 2016-03-24 NOTE — Telephone Encounter (Signed)
NA 2/9-jhb

## 2016-04-13 ENCOUNTER — Ambulatory Visit
Admission: RE | Admit: 2016-04-13 | Discharge: 2016-04-13 | Disposition: A | Discharge status: Home / Self Care | Payer: Medicare HMO | Source: Ambulatory Visit | Attending: Obstetrics and Gynecology | Admitting: Obstetrics and Gynecology

## 2016-04-13 DIAGNOSIS — Z1231 Encounter for screening mammogram for malignant neoplasm of breast: Secondary | ICD-10-CM | POA: Diagnosis not present

## 2016-04-14 ENCOUNTER — Ambulatory Visit: Payer: PPO | Admitting: Family Medicine

## 2016-04-18 ENCOUNTER — Ambulatory Visit: Payer: PPO | Admitting: Family Medicine

## 2016-04-21 ENCOUNTER — Telehealth: Payer: Self-pay | Admitting: Family Medicine

## 2016-04-21 NOTE — Telephone Encounter (Signed)
Discussed new comments on letter sent to patient. Letter does state that breast density is classified as not dense. I did encourage her to go to the website that they included within the letter to learn more.

## 2016-04-27 DIAGNOSIS — R69 Illness, unspecified: Secondary | ICD-10-CM | POA: Diagnosis not present

## 2016-04-27 DIAGNOSIS — Z638 Other specified problems related to primary support group: Secondary | ICD-10-CM | POA: Diagnosis not present

## 2016-05-29 ENCOUNTER — Encounter: Payer: Self-pay | Admitting: Family Medicine

## 2016-05-29 ENCOUNTER — Ambulatory Visit (INDEPENDENT_AMBULATORY_CARE_PROVIDER_SITE_OTHER): Payer: Medicare HMO | Admitting: Family Medicine

## 2016-05-29 VITALS — BP 135/75 | HR 55 | Temp 97.1°F | Ht 63.5 in | Wt 170.0 lb

## 2016-05-29 DIAGNOSIS — I10 Essential (primary) hypertension: Secondary | ICD-10-CM

## 2016-05-29 DIAGNOSIS — E785 Hyperlipidemia, unspecified: Secondary | ICD-10-CM

## 2016-05-29 DIAGNOSIS — Z6829 Body mass index (BMI) 29.0-29.9, adult: Secondary | ICD-10-CM

## 2016-05-29 DIAGNOSIS — K2100 Gastro-esophageal reflux disease with esophagitis, without bleeding: Secondary | ICD-10-CM

## 2016-05-29 DIAGNOSIS — R109 Unspecified abdominal pain: Secondary | ICD-10-CM | POA: Diagnosis not present

## 2016-05-29 LAB — URINALYSIS, COMPLETE
Bilirubin, UA: NEGATIVE
Glucose, UA: NEGATIVE
Ketones, UA: NEGATIVE
Leukocytes, UA: NEGATIVE
Nitrite, UA: NEGATIVE
Protein, UA: NEGATIVE
Specific Gravity, UA: 1.02 (ref 1.005–1.030)
Urobilinogen, Ur: 0.2 mg/dL (ref 0.2–1.0)
pH, UA: 6.5 (ref 5.0–7.5)

## 2016-05-29 LAB — MICROSCOPIC EXAMINATION
Bacteria, UA: NONE SEEN
RBC, UA: NONE SEEN /hpf (ref 0–?)
Renal Epithel, UA: NONE SEEN /hpf
WBC, UA: NONE SEEN /hpf (ref 0–?)

## 2016-05-29 MED ORDER — OMEPRAZOLE 20 MG PO CPDR: 20.0000 mg | DELAYED_RELEASE_CAPSULE | Freq: Two times a day (BID) | ORAL | 6 refills | Status: DC

## 2016-05-29 NOTE — Progress Notes (Signed)
BP 135/75   Pulse (!) 55   Temp 97.1 F (36.2 C) (Oral)   Ht 5' 3.5" (1.613 m)   Wt 170 lb (77.1 kg)   BMI 29.64 kg/m    Subjective:    Patient ID: [name] [name], female    DOB: Oct 11, 1950, 66 y.o.   MRN: [id number]  HPI: [name] [name] is a 66 y.o. female presenting on 05/29/2016 for Back Pain (right flank pain, began 2 weeks ago); Abdominal Pain (began 2-3 months ago, accompanied by nausea); and Labwork (patient is fasting, would like for full lab panel including thyroid)   Back Pain  This is a new problem. Episode onset: two weeks ago. The problem occurs intermittently. The problem is unchanged. Pain location: right flank. Quality: dull. The pain does not radiate. The pain is mild. Exacerbated by: walking. Associated symptoms include abdominal pain. She has tried NSAIDs for the symptoms. The treatment provided mild relief.  Abdominal Pain  This is a new problem. Episode onset: two months ago. The problem occurs 2 to 4 times per day. Duration: 10 minutes. The problem has been unchanged. The pain is located in the epigastric region. The pain is at a severity of 8/10. The quality of the pain is sharp and burning. The abdominal pain does not radiate. Associated symptoms include nausea. Pertinent negatives include no arthralgias, constipation, diarrhea, myalgias or vomiting. Associated symptoms comments: Patient states that she has gained ten pounds in the last two months because eating relieves the pain; she is worried that her lipid levels will be off and that her thyroid could be not working. Exacerbated by: empty stomach. Relieved by: eating. Treatments tried: occasional use of protonix. The treatment provided no relief. duodenal ulcer in her twenties   Tick bite Patient states that she removed two ticks on March 31st. One from her left flank area and the second from her left ankle. Denies swelling, bruising, rash, muscle or joint pains.  Relevant past medical, surgical, family and social  history reviewed and updated as indicated. Interim medical history since our last visit reviewed. Allergies and medications reviewed and updated.  Review of Systems  Constitutional:       10 lbs weight gain  HENT: Negative.   Eyes: Negative.   Respiratory:       Mild dyspnea on exertion, relived by inhaler  Cardiovascular: Negative.   Gastrointestinal: Positive for abdominal pain and nausea. Negative for constipation, diarrhea and vomiting.  Endocrine: Positive for polyphagia. Negative for cold intolerance, heat intolerance, polydipsia and polyuria.  Genitourinary: Negative.   Musculoskeletal: Positive for back pain. Negative for arthralgias and myalgias.  Skin: Negative.   Neurological: Negative.     Per HPI unless specifically indicated above      Objective:    BP 135/75   Pulse (!) 55   Temp 97.1 F (36.2 C) (Oral)   Ht 5' 3.5" (1.613 m)   Wt 170 lb (77.1 kg)   BMI 29.64 kg/m   Wt Readings from Last 3 Encounters:  05/29/16 170 lb (77.1 kg)  03/17/16 165 lb 3.2 oz (74.9 kg)  10/15/15 162 lb 6.4 oz (73.7 kg)    Physical Exam  Constitutional: She is oriented to person, place, and time. She appears well-developed and well-nourished. No distress.  HENT:  Head: Normocephalic and atraumatic.  Eyes: Conjunctivae and EOM are normal. Pupils are equal, round, and reactive to light.  Neck: Normal range of motion. Neck supple. No thyromegaly present.  Cardiovascular: Normal rate, regular  rhythm, normal heart sounds and intact distal pulses.  Exam reveals no gallop and no friction rub.   No murmur heard. Pulmonary/Chest: Effort normal and breath sounds normal. No respiratory distress.  Abdominal: Soft. Bowel sounds are normal. She exhibits no distension and no mass. There is tenderness. There is no rebound and no guarding.  Mild epigastric tenderness  Musculoskeletal: Normal range of motion.  Negative CVA tenderness  Neurological: She is alert and oriented to person, place,  and time.  Skin: Skin is warm and dry.  No sign of bruising, rash, erythema, or swelling in areas of tick bites  Psychiatric: She has a normal mood and affect. Her behavior is normal.      Assessment & Plan:   Problem List Items Addressed This Visit      Cardiovascular and Mediastinum   Essential hypertension   Relevant Orders   CMP14+EGFR (Completed)     Other   Hyperlipidemia LDL goal <70   Relevant Orders   Lipid panel (Completed)   BMI 29.0-29.9,adult   Relevant Orders   Lipid panel (Completed)   TSH (Completed)    Other Visit Diagnoses    Gastroesophageal reflux disease with esophagitis    -  Primary   Relevant Medications   omeprazole (PRILOSEC) 20 MG capsule   Flank pain       Likely referred pain from her stomach   Relevant Orders   Urinalysis, Complete (Completed)      Patient seen and examined with Nuala Alpha PA student, agree with assessment and plan above  Caryl Pina, MD [place] 05/31/2016, 12:55 PM      Follow up plan: Return in about 6 months (around 11/28/2016), or if symptoms worsen or fail to improve.  Counseling provided for all of the vaccine components Orders Placed This Encounter  Procedures  . Urinalysis, Complete  . CMP14+EGFR  . Lipid panel  . TSH

## 2016-05-30 LAB — CMP14+EGFR
ALT: 14 IU/L (ref 0–32)
AST: 15 IU/L (ref 0–40)
Albumin/Globulin Ratio: 2 (ref 1.2–2.2)
Albumin: 4.5 g/dL (ref 3.6–4.8)
Alkaline Phosphatase: 82 IU/L (ref 39–117)
BUN/Creatinine Ratio: 21 (ref 12–28)
BUN: 15 mg/dL (ref 8–27)
Bilirubin Total: 0.3 mg/dL (ref 0.0–1.2)
CO2: 23 mmol/L (ref 18–29)
Calcium: 9.5 mg/dL (ref 8.7–10.3)
Chloride: 102 mmol/L (ref 96–106)
Creatinine, Ser: 0.73 mg/dL (ref 0.57–1.00)
GFR calc Af Amer: 100 mL/min/{1.73_m2} (ref >59)
GFR calc non Af Amer: 87 mL/min/{1.73_m2} (ref >59)
Globulin, Total: 2.2 g/dL (ref 1.5–4.5)
Glucose: 92 mg/dL (ref 65–99)
Potassium: 4.7 mmol/L (ref 3.5–5.2)
Sodium: 143 mmol/L (ref 134–144)
Total Protein: 6.7 g/dL (ref 6.0–8.5)

## 2016-05-30 LAB — LIPID PANEL
Chol/HDL Ratio: 2.6 ratio (ref 0.0–4.4)
Cholesterol, Total: 174 mg/dL (ref 100–199)
HDL: 68 mg/dL (ref >39)
LDL Calculated: 83 mg/dL (ref 0–99)
Triglycerides: 115 mg/dL (ref 0–149)
VLDL Cholesterol Cal: 23 mg/dL (ref 5–40)

## 2016-05-30 LAB — TSH: TSH: 1.5 u[IU]/mL (ref 0.450–4.500)

## 2016-06-06 ENCOUNTER — Ambulatory Visit: Payer: Medicare HMO | Admitting: Family Medicine

## 2016-06-21 ENCOUNTER — Telehealth: Payer: Self-pay | Admitting: Family Medicine

## 2016-06-21 MED ORDER — PANTOPRAZOLE SODIUM 40 MG PO TBEC: 40.0000 mg | DELAYED_RELEASE_TABLET | Freq: Every day | ORAL | 3 refills | Status: DC

## 2016-06-21 NOTE — Telephone Encounter (Signed)
Okay go ahead and send the Protonix for her.

## 2016-06-21 NOTE — Telephone Encounter (Signed)
Patient states that she had a old rx of Protonix 40mg  and has been using it and states it really helps. Would like rx sent to the pharmacy not on med list. Please advise.

## 2016-06-21 NOTE — Telephone Encounter (Signed)
Per pt Insurance will cover Protonix instead of Prilosec She is currently taking Protonix 40mg  qd This is not on pt's med list Is this ok to refill

## 2016-07-06 ENCOUNTER — Telehealth: Payer: Self-pay | Admitting: Cardiovascular Disease

## 2016-07-06 NOTE — Telephone Encounter (Signed)
New message  ° ° ° ° °Pt is returning your call  °

## 2016-07-06 NOTE — Telephone Encounter (Signed)
Pt has an appointment tomorrow morning with Dr Claiborne Billings. She says he always order an ekg.She wants to know if she takes generic Flexeril will that  effect her ekg tomorrow?

## 2016-07-06 NOTE — Telephone Encounter (Signed)
Informed the patient that per the pharmacist the Flexeril should not effect the EKG results. She verbalized her understanding.

## 2016-07-06 NOTE — Telephone Encounter (Signed)
Left a message to call back.

## 2016-07-07 ENCOUNTER — Ambulatory Visit (INDEPENDENT_AMBULATORY_CARE_PROVIDER_SITE_OTHER): Payer: Medicare HMO | Admitting: Cardiovascular Disease

## 2016-07-07 ENCOUNTER — Encounter: Payer: Self-pay | Admitting: Cardiovascular Disease

## 2016-07-07 VITALS — BP 128/70 | HR 58 | Ht 63.0 in | Wt 164.0 lb

## 2016-07-07 DIAGNOSIS — I10 Essential (primary) hypertension: Secondary | ICD-10-CM

## 2016-07-07 DIAGNOSIS — I2510 Atherosclerotic heart disease of native coronary artery without angina pectoris: Secondary | ICD-10-CM

## 2016-07-07 DIAGNOSIS — K219 Gastro-esophageal reflux disease without esophagitis: Secondary | ICD-10-CM

## 2016-07-07 DIAGNOSIS — R001 Bradycardia, unspecified: Secondary | ICD-10-CM

## 2016-07-07 DIAGNOSIS — E785 Hyperlipidemia, unspecified: Secondary | ICD-10-CM

## 2016-07-07 MED ORDER — ATORVASTATIN CALCIUM 20 MG PO TABS: 20.0000 mg | ORAL_TABLET | Freq: Every day | ORAL | 3 refills | Status: DC

## 2016-07-07 NOTE — Progress Notes (Signed)
Patient ID: [name] [name], female   DOB: 09/29/1950, 66 y.o.   MRN: [id number]     HPI: [name] [name] is a 66 y.o. female who presents to the office today for a 19 month follow up cardiology evaluation.  [name] [name] has CAD and underwent cardiac catheterization on 05/22/2011 which revealed 70% distal left main stenosis, 60% ostial circumflex stenosis and mild RCA disease.  She underwent CABG surgery x2 by Dr. Nils Pyle and had a LIMA placed to her LAD, and vein to the OM 2 vessel.  Ejection fraction was 50-55%.  In October 2014, she  noticed some vague chest fullness and underwent a nuclear perfusion study which demonstrated hyperdynamic LV with an EF of 83% post stress.  She did not have ECG changes; she had normal perfusion and function.  When I last saw her in October 2016  she has been without chest pain.  She has a history of vitamin D insufficiency in the past which had improved.  She has issues with muscle spasm for which and was taking Flexeril on an as-needed basis.  She has GERD for which he takes Protonix on an as-needed basis.  She has been maintained on Lipitor 10 mg for hyperlipidemia.  She had issues with diarrhea.  At that time had stopped her losartan.  Since I last saw her, she has been without chest pain.  She does have right hip discomfort.  She underwent an echo Doppler study in October 2016 which showed an EF of 65-70% with grade 1 diastolic dysfunction.  There was mild TR, and otherwise no significant valvular pathology.  She is unaware of any palpitations.  She denies PND, orthopnea.  She had blood work one month ago by her primary physician and her TSH was 1.5.  BUN and creatinine were 15 and 0.73, respectively.  LFTs were normal.total cholesterol 174 with triglycerides 115, HDL 68, LDL 83.  She presents for evaluation   Past Surgical History:  Procedure Laterality Date  . ABDOMINAL HYSTERECTOMY  2003  . CARDIAC CATHETERIZATION  05/22/2011  . CORONARY ARTERY BYPASS GRAFT  05/26/2011     Procedure: CORONARY ARTERY BYPASS GRAFTING (CABG);  Surgeon: Ivin Poot, MD;  Location: [facility];  Service: Open Heart Surgery;  Laterality: N/A;  Coronary Artery Bypass Graft on Pump times two utlizing left internal mammary artery and right greater saphenous vein harvested endoscopically   . CORONARY ARTERY BYPASS GRAFT     done by Dr. Kerby Less  . LEFT HEART CATHETERIZATION WITH CORONARY ANGIOGRAM N/A 05/24/2011   Procedure: LEFT HEART CATHETERIZATION WITH CORONARY ANGIOGRAM;  Surgeon: Leonie Man, MD;  Location: [facility] CATH LAB;  Service: Cardiovascular;  Laterality: N/A;  . TONSILLECTOMY  1962  . TUBAL LIGATION      Allergies  Allergen Reactions  . Amlodipine Swelling  . Cetirizine & Related Other (See Comments)    Extreme Drowsiness - "knocks me loopy for 2 days"  . Latex Rash  . Levofloxacin Nausea And Vomiting  . Lisinopril Other (See Comments)    Excessive mucus production  . Prednisone Other (See Comments)    High dose - hallucinations    Current Outpatient Prescriptions  Medication Sig Dispense Refill  . aspirin 81 MG tablet Take 81 mg by mouth daily.    Marland Kitchen conjugated estrogens (PREMARIN) vaginal cream Place 1 Applicatorful vaginally 2 (two) times a week. 30 g 5  . losartan (COZAAR) 25 MG tablet Take 1 tablet (25 mg total) by mouth daily. Please  schedule appointment for 90 day supply or refills. 90 tablet 1  . pantoprazole (PROTONIX) 40 MG tablet Take 1 tablet (40 mg total) by mouth daily. (Patient taking differently: Take 40 mg by mouth as needed. ) 30 tablet 3  . atorvastatin (LIPITOR) 20 MG tablet Take 1 tablet (20 mg total) by mouth daily. 90 tablet 3  . cyclobenzaprine (FLEXERIL) 5 MG tablet Take 1 tablet (5 mg total) by mouth 3 (three) times daily as needed for muscle spasms. 21 tablet 0  . HYDROcodone-acetaminophen (NORCO/VICODIN) 5-325 MG tablet Take 1 tablet by mouth every 4 (four) hours as needed. 12 tablet 0  . naproxen (NAPROSYN) 500 MG tablet Take 1 tablet (500 mg  total) by mouth 2 (two) times daily. 30 tablet 0   No current facility-administered medications for this visit.     Social History   Social History  . Marital status: Divorced    Spouse name: N/A  . Number of children: N/A  . Years of education: N/A   Occupational History  . Not on file.   Social History Main Topics  . Smoking status: Former Smoker    Quit date: 05/23/2006  . Smokeless tobacco: Never Used  . Alcohol use 0.0 oz/week     Comment: rare glass of wine  . Drug use: No  . Sexual activity: Not Currently    Birth control/ protection: Post-menopausal   Other Topics Concern  . Not on file   Social History Narrative  . No narrative on file    Family History  Problem Relation Age of Onset  . Stroke Mother   . Diabetes Mother        history of diabetes  . Heart failure Mother   . Cancer Father 67       bone cancer  . Drug abuse Sister   . Cancer Brother        throat  . Alcohol abuse Sister   . Colon cancer Neg Hx     ROS General: Negative; No fevers, chills, or night sweats HEENT: Negative; No changes in vision or hearing, sinus congestion, difficulty swallowing Pulmonary: Negative; No cough, wheezing, shortness of breath, hemoptysis Cardiovascular: See HPI: No chest pain, presyncope, syncope, palpatations GI: Negative; No nausea, vomiting, diarrhea, or abdominal pain GU: Negative; No dysuria, hematuria, or difficulty voiding Musculoskeletal: Positive for occasional muscle spasm Hematologic: Negative; no easy bruising, bleeding Endocrine: Negative; no heat/cold intolerance; no diabetes, Neuro: Negative; no changes in balance, headaches Skin: Negative; No rashes or skin lesions Psychiatric: Negative; No behavioral problems, depression Sleep: Negative; No snoring,  daytime sleepiness, hypersomnolence, bruxism, restless legs, hypnogognic hallucinations. Other comprehensive 14 point system review is negative   Physical Exam BP 128/70   Pulse (!) 58    Ht _0  (1.6 m)   Wt 164 lb (74.4 kg)   BMI 29.05 kg/m    Repeat blood pressure by me 130/68  Wt Readings from Last 3 Encounters:  07/07/16 164 lb (74.4 kg)  05/29/16 170 lb (77.1 kg)  03/17/16 165 lb 3.2 oz (74.9 kg)    General: Alert, oriented, no distress.  Skin: normal turgor, no rashes, warm and dry HEENT: Normocephalic, atraumatic. Pupils equal round and reactive to light; sclera anicteric; extraocular muscles intact;  Nose without nasal septal hypertrophy Mouth/Parynx benign; Mallinpatti scale 2 Neck: No JVD, no carotid bruits; normal carotid upstroke Lungs: clear to ausculatation and percussion; no wheezing or rales Chest wall: without tenderness to palpitation Heart: PMI not displaced, RRR, s1  s2 normal, 1/6 systolic murmur, no diastolic murmur, no rubs, gallops, thrills, or heaves Abdomen: soft, nontender; no hepatosplenomehaly, BS+; abdominal aorta nontender and not dilated by palpation. Back: no CVA tenderness Pulses 2+ Musculoskeletal: full range of motion, normal strength, no joint deformities Extremities: no clubbing cyanosis or edema, Homan's sign negative  Neurologic: grossly nonfocal; Cranial nerves grossly wnl Psychologic: Normal mood and affect   ECG (independently read by me): Sinus bradycardia at 58 bpm.  Nonspecific ST-T changes.  QTc interval 394 [name].  PR interval 140 [name].  October 2016 ECG (independently read by me): Sinus bradycardia 53 bpm.  No ectopy.  Normal intervals.  August 2015 ECG (independently read by me): Sinus bradycardia at 47 beats per minute.  Nonspecific T changes.  QTc interval   385 [name]  LABS: BMP Latest Ref Rng & Units 07/08/2016 05/29/2016 10/15/2015  Glucose 65 - 99 mg/dL 98 92 89  BUN 6 - 20 mg/dL _0 Creatinine 0.44 - 1.00 mg/dL 0.81 0.73 0.78  BUN/Creat Ratio 12 - 28 - 21 17  Sodium 135 - 145 mmol/L 137 143 141  Potassium 3.5 - 5.1 mmol/L 3.7 4.7 4.1  Chloride 101 - 111 mmol/L 105 102 102  CO2 22 - 32 mmol/L _1 Calcium 8.9 - 10.3 mg/dL 9.3 9.5 9.4   Hepatic Function Latest Ref Rng & Units 07/08/2016 05/29/2016 10/15/2015  Total Protein 6.5 - 8.1 g/dL 6.8 6.7 6.5  Albumin 3.5 - 5.0 g/dL 4.5 4.5 4.4  AST 15 - 41 U/L _2 ALT 14 - 54 U/L 11(L) 14 16  Alk Phosphatase 38 - 126 U/L 68 82 80  Total Bilirubin 0.3 - 1.2 mg/dL 0.7 0.3 0.4   CBC Latest Ref Rng & Units 07/08/2016 10/15/2015 11/18/2014  WBC 4.0 - 10.5 K/uL 10.3 6.3 5.8  Hemoglobin 12.0 - 15.0 g/dL 14.8 - 14.6  Hematocrit 36.0 - 46.0 % 45.0 42.9 42.3  Platelets 150 - 400 K/uL 377 330 410(H)   Lab Results  Component Value Date   MCV 85.7 07/08/2016   MCV 86 10/15/2015   MCV 82.3 11/18/2014   Lab Results  Component Value Date   TSH 1.500 05/29/2016   Lab Results  Component Value Date   HGBA1C 6.0 04/08/2015   Lipid Panel     Component Value Date/Time   CHOL 174 05/29/2016 0922   TRIG 115 05/29/2016 0922   HDL 68 05/29/2016 0922   CHOLHDL 2.6 05/29/2016 0922   CHOLHDL 2.3 11/18/2014 0928   VLDL 15 11/18/2014 0928   LDLCALC 83 05/29/2016 0922   RADIOLOGY: No results found.  IMPRESSION:  1. Coronary artery disease involving native coronary artery of native heart without angina pectoris   2. Hyperlipidemia LDL goal <70   3. Essential hypertension   4. Gastroesophageal reflux disease without esophagitis   5. Sinus bradycardia     ASSESSMENT AND PLAN: [name] [name] is a very pleasant 66 year old female, who is 5 years status post CABG revascularization surgery in April 2013 after she was found to have high-grade left main stenosis.  A  2014 nuclear study showed normal perfusion and hyperdynamic LV function.  In the past.  She had issues with diarrhea and felt it may been contributed by losartan.  She also was on beta blocker therapy.  Has only, her blood pressure is controlled and she is back on very low-dose losartan at 25 mg.  She is no longer on beta blocker  treatment.  On ECG.  She has sinus bradycardia with a rate at  58.  I have recommended titration of atorvastatin to 20 mg in attempt to obtain an LDL cholesterol less than 70 since her most recent laboratory and shown this to be 83.  She has GERD and this is controlled with Protonix.  Her BMI is 29 and is consistent with being mildly overweight.  I have encouraged exercise at least 5 days per week for 30 minutes at a time if at all possible.  In 3 months.  Repeat laboratory will be obtained.  I will see her in 6 months for reevaluation.    Time spent: 25 minutes Troy Sine, MD, [facility]  07/09/2016 10:36 PM

## 2016-07-07 NOTE — Patient Instructions (Signed)
Your physician has recommended you make the following change in your medication:   1.) the atorvastatin has been increased from 10 mg daily to 20 mg daily. A new prescription has been provided to you today.  Your physician recommends that you return for lab work in: 3 months here in our office. No appointment is needed. DO NOT EAT OR DRINK PAST MIDNIGHT THE NIGHT BEFORE YOU COME.  Your physician wants you to follow-up in: 6 months or sooner if needed. You will receive a reminder letter in the mail two months in advance. If you don't receive a letter, please call our office to schedule the follow-up appointment.  If you need a refill on your cardiac medications before your next appointment, please call your pharmacy.

## 2016-07-08 ENCOUNTER — Encounter (HOSPITAL_COMMUNITY): Payer: Self-pay | Admitting: *Deleted

## 2016-07-08 ENCOUNTER — Emergency Department (HOSPITAL_COMMUNITY)
Admission: EM | Admit: 2016-07-08 | Discharge: 2016-07-08 | Disposition: A | Discharge status: Home / Self Care | Payer: Medicare HMO | Attending: Emergency Medicine | Admitting: Emergency Medicine

## 2016-07-08 ENCOUNTER — Emergency Department (HOSPITAL_COMMUNITY): Payer: Medicare HMO

## 2016-07-08 DIAGNOSIS — Z87891 Personal history of nicotine dependence: Secondary | ICD-10-CM | POA: Diagnosis not present

## 2016-07-08 DIAGNOSIS — Z79899 Other long term (current) drug therapy: Secondary | ICD-10-CM | POA: Insufficient documentation

## 2016-07-08 DIAGNOSIS — N189 Chronic kidney disease, unspecified: Secondary | ICD-10-CM | POA: Diagnosis not present

## 2016-07-08 DIAGNOSIS — I2510 Atherosclerotic heart disease of native coronary artery without angina pectoris: Secondary | ICD-10-CM | POA: Insufficient documentation

## 2016-07-08 DIAGNOSIS — I129 Hypertensive chronic kidney disease with stage 1 through stage 4 chronic kidney disease, or unspecified chronic kidney disease: Secondary | ICD-10-CM | POA: Diagnosis not present

## 2016-07-08 DIAGNOSIS — M5431 Sciatica, right side: Secondary | ICD-10-CM | POA: Diagnosis not present

## 2016-07-08 DIAGNOSIS — Z951 Presence of aortocoronary bypass graft: Secondary | ICD-10-CM | POA: Diagnosis not present

## 2016-07-08 DIAGNOSIS — Z7982 Long term (current) use of aspirin: Secondary | ICD-10-CM | POA: Diagnosis not present

## 2016-07-08 DIAGNOSIS — Z91040 Latex allergy status: Secondary | ICD-10-CM | POA: Diagnosis not present

## 2016-07-08 DIAGNOSIS — R1030 Lower abdominal pain, unspecified: Secondary | ICD-10-CM | POA: Diagnosis present

## 2016-07-08 DIAGNOSIS — M1611 Unilateral primary osteoarthritis, right hip: Secondary | ICD-10-CM | POA: Diagnosis not present

## 2016-07-08 DIAGNOSIS — M47816 Spondylosis without myelopathy or radiculopathy, lumbar region: Secondary | ICD-10-CM | POA: Diagnosis not present

## 2016-07-08 LAB — CBC
HCT: 45 % (ref 36.0–46.0)
Hemoglobin: 14.8 g/dL (ref 12.0–15.0)
MCH: 28.2 pg (ref 26.0–34.0)
MCHC: 32.9 g/dL (ref 30.0–36.0)
MCV: 85.7 fL (ref 78.0–100.0)
Platelets: 377 10*3/uL (ref 150–400)
RBC: 5.25 MIL/uL — ABNORMAL HIGH (ref 3.87–5.11)
RDW: 14 % (ref 11.5–15.5)
WBC: 10.3 10*3/uL (ref 4.0–10.5)

## 2016-07-08 LAB — URINALYSIS, ROUTINE W REFLEX MICROSCOPIC
Bilirubin Urine: NEGATIVE
Glucose, UA: NEGATIVE mg/dL
Hgb urine dipstick: NEGATIVE
Ketones, ur: NEGATIVE mg/dL
Leukocytes, UA: NEGATIVE
Nitrite: NEGATIVE
Protein, ur: NEGATIVE mg/dL
Specific Gravity, Urine: 1.02 (ref 1.005–1.030)
pH: 5 (ref 5.0–8.0)

## 2016-07-08 LAB — COMPREHENSIVE METABOLIC PANEL
ALT: 11 U/L — ABNORMAL LOW (ref 14–54)
AST: 17 U/L (ref 15–41)
Albumin: 4.5 g/dL (ref 3.5–5.0)
Alkaline Phosphatase: 68 U/L (ref 38–126)
Anion gap: 8 (ref 5–15)
BUN: 12 mg/dL (ref 6–20)
CO2: 24 mmol/L (ref 22–32)
Calcium: 9.3 mg/dL (ref 8.9–10.3)
Chloride: 105 mmol/L (ref 101–111)
Creatinine, Ser: 0.81 mg/dL (ref 0.44–1.00)
GFR calc Af Amer: >60 mL/min (ref >60)
GFR calc non Af Amer: >60 mL/min (ref >60)
Glucose, Bld: 98 mg/dL (ref 65–99)
Potassium: 3.7 mmol/L (ref 3.5–5.1)
Sodium: 137 mmol/L (ref 135–145)
Total Bilirubin: 0.7 mg/dL (ref 0.3–1.2)
Total Protein: 6.8 g/dL (ref 6.5–8.1)

## 2016-07-08 LAB — LIPASE, BLOOD: Lipase: 24 U/L (ref 11–51)

## 2016-07-08 MED ORDER — CYCLOBENZAPRINE HCL 5 MG PO TABS: 5.0000 mg | ORAL_TABLET | Freq: Three times a day (TID) | ORAL | 0 refills | Status: DC | PRN

## 2016-07-08 MED ORDER — HYDROCODONE-ACETAMINOPHEN 5-325 MG PO TABS: 1.0000 | ORAL_TABLET | ORAL | 0 refills | Status: DC | PRN

## 2016-07-08 MED ORDER — NAPROXEN 500 MG PO TABS: 500.0000 mg | ORAL_TABLET | Freq: Two times a day (BID) | ORAL | 0 refills | Status: DC

## 2016-07-08 MED ORDER — IBUPROFEN 400 MG PO TABS
400.0000 mg | ORAL_TABLET | Freq: Once | ORAL | Status: AC
  Administered 2016-07-08: 400 mg via ORAL
  Filled 2016-07-08: qty 1

## 2016-07-08 NOTE — ED Provider Notes (Signed)
[facility] DEPT Provider Note   CSN: [id number] Arrival date & time: 07/08/16  1324     History   Chief Complaint Chief Complaint  Patient presents with  . Hip Pain  . Groin Pain  . Abdominal Pain    HPI [name] [name] is a 66 y.o. female.  HPI Pt started having pain in her right hip area about a week ago.  The pain goes toward the back and down her leg.  Sometimes it goes toward her abdomen.  No dysuria.  No blood in the urine.  No numbness or weakness.   The pain gets very intense at times.  Certain movements make it flare up.  She has not had any injuries or falls.   She called her doctor today who told her to come to the ED. Past Medical History:  Diagnosis Date  . Anxiety   . Chronic kidney disease    cyst left kidney  . Complication of anesthesia    difficult waking up "  . Depression   . GERD (gastroesophageal reflux disease)   . Heart murmur    as child  . Hemorrhoids    will occ bleed   . Hypertension   . IBS (irritable bowel syndrome)     Patient Active Problem List   Diagnosis Date Noted  . Atrophic vaginitis 08/26/2015  . BMI 29.0-29.9,adult 04/08/2015  . Essential hypertension 11/21/2014  . Hyperlipidemia LDL goal <70 11/21/2014  . Bradycardia 10/10/2013  . S/P CABG x 2 06/19/2011  . CAD (coronary artery disease) CABG X 2 05/26/11 05/27/2011  . Abnormal screening cardiac CT, > 50% LMCA; Confirmed by Cardiac Cath 60-70% lesion 05/24/2011  . Unstable angina ([place]) 05/23/2011  . Anxiety 05/23/2011  . Depression 05/23/2011  . Renal cyst 05/23/2011  . History of smoking, quit 2008 05/23/2011    Past Surgical History:  Procedure Laterality Date  . ABDOMINAL HYSTERECTOMY  2003  . CARDIAC CATHETERIZATION  05/22/2011  . CORONARY ARTERY BYPASS GRAFT  05/26/2011   Procedure: CORONARY ARTERY BYPASS GRAFTING (CABG);  Surgeon: Ivin Poot, MD;  Location: [facility];  Service: Open Heart Surgery;  Laterality: N/A;  Coronary Artery Bypass Graft on Pump times  two utlizing left internal mammary artery and right greater saphenous vein harvested endoscopically   . CORONARY ARTERY BYPASS GRAFT     done by Dr. Kerby Less  . LEFT HEART CATHETERIZATION WITH CORONARY ANGIOGRAM N/A 05/24/2011   Procedure: LEFT HEART CATHETERIZATION WITH CORONARY ANGIOGRAM;  Surgeon: Leonie Man, MD;  Location: [facility] CATH LAB;  Service: Cardiovascular;  Laterality: N/A;  . TONSILLECTOMY  1962  . TUBAL LIGATION      OB History    No data available       Home Medications    Prior to Admission medications   Medication Sig Start Date End Date Taking? Authorizing Provider  aspirin 81 MG tablet Take 81 mg by mouth daily.    [provider]  atorvastatin (LIPITOR) 20 MG tablet Take 1 tablet (20 mg total) by mouth daily. 07/07/16 10/05/16  Troy Sine, MD  conjugated estrogens (PREMARIN) vaginal cream Place 1 Applicatorful vaginally 2 (two) times a week. 08/27/15   Timmothy Euler, MD  cyclobenzaprine (FLEXERIL) 5 MG tablet Take 1 tablet (5 mg total) by mouth 3 (three) times daily as needed for muscle spasms. 07/08/16   Dorie Rank, MD  HYDROcodone-acetaminophen (NORCO/VICODIN) 5-325 MG tablet Take 1 tablet by mouth every 4 (four) hours as needed. 07/08/16  Dorie Rank, MD  losartan (COZAAR) 25 MG tablet Take 1 tablet (25 mg total) by mouth daily. Please schedule appointment for 90 day supply or refills. 03/17/16   Wardell Honour, MD  naproxen (NAPROSYN) 500 MG tablet Take 1 tablet (500 mg total) by mouth 2 (two) times daily. 07/08/16   Dorie Rank, MD  pantoprazole (PROTONIX) 40 MG tablet Take 1 tablet (40 mg total) by mouth daily. Patient taking differently: Take 40 mg by mouth as needed.  06/21/16   Dettinger, Fransisca Kaufmann, MD    Family History Family History  Problem Relation Age of Onset  . Stroke Mother   . Diabetes Mother        history of diabetes  . Heart failure Mother   . Cancer Father 64       bone cancer  . Drug abuse Sister   . Cancer Brother         throat  . Alcohol abuse Sister   . Colon cancer Neg Hx     Social History Social History  Substance Use Topics  . Smoking status: Former Smoker    Quit date: 05/23/2006  . Smokeless tobacco: Never Used  . Alcohol use 0.0 oz/week     Comment: rare glass of wine     Allergies   Amlodipine; Cetirizine & related; Latex; Levofloxacin; Lisinopril; and Prednisone   Review of Systems Review of Systems  All other systems reviewed and are negative.    Physical Exam Updated Vital Signs BP (!) 156/77 (BP Location: Right Arm)   Pulse 65   Temp 98.5 F (36.9 C) (Oral)   Resp 16   SpO2 100%   Physical Exam  Constitutional: She appears well-developed and well-nourished. No distress.  HENT:  Head: Normocephalic and atraumatic.  Right Ear: External ear normal.  Left Ear: External ear normal.  Eyes: Conjunctivae are normal. Right eye exhibits no discharge. Left eye exhibits no discharge. No scleral icterus.  Neck: Neck supple. No tracheal deviation present.  Cardiovascular: Normal rate, regular rhythm and intact distal pulses.   Pulmonary/Chest: Effort normal and breath sounds normal. No stridor. No respiratory distress. She has no wheezes. She has no rales.  Abdominal: Soft. Bowel sounds are normal. She exhibits no distension. There is no tenderness. There is no rebound and no guarding.  Musculoskeletal: She exhibits no edema or tenderness.  Neurological: She is alert. She has normal strength. No cranial nerve deficit (no facial droop, extraocular movements intact, no slurred speech) or sensory deficit. She exhibits normal muscle tone. She displays no seizure activity. Coordination normal.  Negative straight leg raise, 5 out of 5 strength bilateral lower extremities, normal sensation  Skin: Skin is warm and dry. No rash noted.  Psychiatric: She has a normal mood and affect.  Nursing note and vitals reviewed.    ED Treatments / Results  Labs (all labs ordered are listed, but only  abnormal results are displayed) Labs Reviewed  COMPREHENSIVE METABOLIC PANEL - Abnormal; Notable for the following:       Result Value   ALT 11 (*)    All other components within normal limits  CBC - Abnormal; Notable for the following:    RBC 5.25 (*)    All other components within normal limits  URINALYSIS, ROUTINE W REFLEX MICROSCOPIC - Abnormal; Notable for the following:    APPearance HAZY (*)    All other components within normal limits  LIPASE, BLOOD      Radiology Dg Lumbar Spine Complete  Result Date: 07/08/2016 CLINICAL DATA:  Right hip pain. EXAM: LUMBAR SPINE - COMPLETE 4+ VIEW COMPARISON:  None FINDINGS: Mild curvature the lumbar spine is convex towards the left. The vertebral body heights are well preserved. No fractures. Mild multi level disc space narrowing and ventral endplate spurring is most advanced at L1-2. Aortic atherosclerosis noted. IMPRESSION: 1. Mild scoliosis and degenerative disc disease. Electronically Signed   By: Kerby Moors M.D.   On: 07/08/2016 17:25   Dg Hip Unilat W Or Wo Pelvis 2-3 Views Right  Result Date: 07/08/2016 CLINICAL DATA:  Right hip pain.  No trauma. EXAM: DG HIP (WITH OR WITHOUT PELVIS) 2-3V RIGHT COMPARISON:  None FINDINGS: There is no evidence of hip fracture or dislocation. Mild bilateral and symmetric degenerative joint disease involves the hips. No acute fracture or subluxation. IMPRESSION: 1. No acute findings. 2. Mild bilateral hip osteoarthritis. Electronically Signed   By: Kerby Moors M.D.   On: 07/08/2016 17:26    Procedures Procedures (including critical care time)  Medications Ordered in ED Medications  ibuprofen (ADVIL,MOTRIN) tablet 400 mg (400 mg Oral Given 07/08/16 1656)     Initial Impression / Assessment and Plan / ED Course  I have reviewed the triage vital signs and the nursing notes.  Pertinent labs & imaging results that were available during my care of the patient were reviewed by me and considered in  my medical decision making (see chart for details).  Clinical Course as of Jul 09 1815  Sat Jul 08, 2016  1815 Patient was concerned that she may have had some vaginal irritation associated with her Premarin use. The nurse and I offered to do a pelvic exam however after getting her x-rays the patient felt that the x-rays exacerbated her pain and she would prefer not to do the pelvic exam at this time  [JK]    Clinical Course User Index [JK] Dorie Rank, MD  I suspect the patient's symptoms are related to sciatica.  No abdominal ttp.  No hernia on exam.  Labs are reassuring.  Will dc home with pain medications, muscle relaxant.  DIscussed follow up with PCP, consider physical therapy, MRI if not improving.  Final Clinical Impressions(s) / ED Diagnoses   Final diagnoses:  Sciatica of right side    New Prescriptions New Prescriptions   CYCLOBENZAPRINE (FLEXERIL) 5 MG TABLET    Take 1 tablet (5 mg total) by mouth 3 (three) times daily as needed for muscle spasms.   HYDROCODONE-ACETAMINOPHEN (NORCO/VICODIN) 5-325 MG TABLET    Take 1 tablet by mouth every 4 (four) hours as needed.   NAPROXEN (NAPROSYN) 500 MG TABLET    Take 1 tablet (500 mg total) by mouth 2 (two) times daily.     Dorie Rank, MD 07/08/16 [phone]

## 2016-07-08 NOTE — ED Notes (Signed)
Patient transported to X-ray 

## 2016-07-08 NOTE — Discharge Instructions (Signed)
Take the medications as needed for pain, follow up with your primary care doctor for further evaluation, discussion of further treatments, tests such as physical therapy, MRI if not improving

## 2016-07-08 NOTE — ED Triage Notes (Addendum)
To ED for eval of right hip and groin pain and spasms. States prior to pain starting she may have walk/run of treadmill and swimming. Also states she uses vaginal medication which may have caused this pain. Pt is taking Aleve and Flexeril (old script pt filled) and states the pain is tolerable. No known injury noted. Ambulatory without difficulty at present. No trouble with urination or BM. Pt seems anxious. Pt points to RLQ when showing where her pain is.

## 2016-07-11 ENCOUNTER — Telehealth: Payer: Self-pay | Admitting: Family Medicine

## 2016-07-11 NOTE — Telephone Encounter (Signed)
DR Dettinger - can you review the notes and see if there is anything you would suggest?

## 2016-07-12 NOTE — Telephone Encounter (Signed)
There is not any further tests for what they said was likely sciatica versus degenerative disc issues unless we start talking MRI, we would need a visit to even think about scheduling an MRI in the future. There may be other options for treatment if she continues to have pain and issues despite the medication that they gave her which we could discuss other visit. Let me know if she was having further issues and wants to see [place].

## 2016-07-12 NOTE — Telephone Encounter (Signed)
lmtcb

## 2016-07-21 ENCOUNTER — Encounter: Payer: Medicare HMO | Admitting: Obstetrics and Gynecology

## 2016-07-21 NOTE — Telephone Encounter (Signed)
Spoke with patient, she is feeling much better and does not require an appointment at this time.

## 2016-07-27 ENCOUNTER — Telehealth: Payer: Self-pay

## 2016-07-27 MED ORDER — PANTOPRAZOLE SODIUM 40 MG PO TBEC: 40.0000 mg | DELAYED_RELEASE_TABLET | Freq: Every day | ORAL | 0 refills | Status: DC

## 2016-07-31 NOTE — Telephone Encounter (Signed)
Rx sent 

## 2016-09-13 ENCOUNTER — Telehealth: Payer: Self-pay | Admitting: Cardiovascular Disease

## 2016-09-13 NOTE — Telephone Encounter (Signed)
°  Pt c/o BP issue: STAT if pt c/o blurred vision, one-sided weakness or slurred speech  1. What are your last 5 BP readings?125/65  2. Are you having any other symptoms (ex. Dizziness, headache, blurred vision, passed out)? No other symptoms reported  3. What is your BP issue? Low BP and patient feels like her BP meds should be reduced.

## 2016-09-13 NOTE — Telephone Encounter (Signed)
Spoke with pt she states that she is feeling fatigued, has low energy and falling asleep during the day, she states that she has been going to bed at 7pm and then sleeping more than 12 hours at night. She states that her BP has been running today 125/65 HR 62, and 126/66, 130/70. She states that last night she took 1/2 losartan and her BP is still 125/65. She also states that she has recently started a plant based diet in attempts to decrease her lipids because she is concerned with this she is not eating meat and has switched from milk to almond milk. She states that she does not know if the fatigue/BP is from the new col medication and BP med dose changes of this new plant based diet. She would like input from Dr Claiborne Billings. Informed pt to continue losartan as ordered and continue to track her BP she would like call back with message from Dr Claiborne Billings, please advise.

## 2016-09-14 ENCOUNTER — Telehealth: Payer: Self-pay | Admitting: Internal Medicine

## 2016-09-14 NOTE — Telephone Encounter (Signed)
Please see note below and advise  

## 2016-09-15 ENCOUNTER — Ambulatory Visit: Payer: Medicare HMO | Admitting: Family Medicine

## 2016-09-15 NOTE — Telephone Encounter (Signed)
Spoke with pt and she is aware that Dr. Henrene Pastor will accept her as apt. Pt states she has been having issues with RUQ abd pain. She has seen her PCP for this in the past and it went away but now it has returned. She thinks it may be gallbladder related. Pt scheduled to see Alonza Bogus PA Tuesday at 2pm 09/19/16. Pt aware of appt and Dr. Henrene Pastor aware.

## 2016-09-15 NOTE — Telephone Encounter (Signed)
If she wishes to see me I am happy to accept her as a new patient. It would have to be next available office appointment. Alternatively, she might be able to see one of our extenders sooner and they can asign her to me thereafter if she lets them know that is her preference. If she feels she is having acute abdominal problems, she should go to the ER in the interim

## 2016-09-18 ENCOUNTER — Encounter (INDEPENDENT_AMBULATORY_CARE_PROVIDER_SITE_OTHER): Payer: Self-pay

## 2016-09-18 DIAGNOSIS — I2510 Atherosclerotic heart disease of native coronary artery without angina pectoris: Secondary | ICD-10-CM | POA: Diagnosis not present

## 2016-09-18 DIAGNOSIS — E785 Hyperlipidemia, unspecified: Secondary | ICD-10-CM | POA: Diagnosis not present

## 2016-09-18 LAB — COMPREHENSIVE METABOLIC PANEL
ALT: 18 IU/L (ref 0–32)
AST: 21 IU/L (ref 0–40)
Albumin/Globulin Ratio: 2.2 (ref 1.2–2.2)
Albumin: 4.2 g/dL (ref 3.6–4.8)
Alkaline Phosphatase: 78 IU/L (ref 39–117)
BUN/Creatinine Ratio: 21 (ref 12–28)
BUN: 16 mg/dL (ref 8–27)
Bilirubin Total: 0.3 mg/dL (ref 0.0–1.2)
CO2: 23 mmol/L (ref 20–29)
Calcium: 8.6 mg/dL — ABNORMAL LOW (ref 8.7–10.3)
Chloride: 104 mmol/L (ref 96–106)
Creatinine, Ser: 0.76 mg/dL (ref 0.57–1.00)
GFR calc Af Amer: 95 mL/min/{1.73_m2} (ref >59)
GFR calc non Af Amer: 83 mL/min/{1.73_m2} (ref >59)
Globulin, Total: 1.9 g/dL (ref 1.5–4.5)
Glucose: 87 mg/dL (ref 65–99)
Potassium: 4.6 mmol/L (ref 3.5–5.2)
Sodium: 140 mmol/L (ref 134–144)
Total Protein: 6.1 g/dL (ref 6.0–8.5)

## 2016-09-18 LAB — LIPID PANEL
Chol/HDL Ratio: 2.4 ratio (ref 0.0–4.4)
Cholesterol, Total: 138 mg/dL (ref 100–199)
HDL: 57 mg/dL (ref >39)
LDL Calculated: 67 mg/dL (ref 0–99)
Triglycerides: 68 mg/dL (ref 0–149)
VLDL Cholesterol Cal: 14 mg/dL (ref 5–40)

## 2016-09-18 NOTE — Telephone Encounter (Signed)
Blood pressure and pulse are stable.  She is not on any beta blocker.  Would continue the losartan and monitor blood pressure.  She may need evaluation of her sleep quality.  Question of any suggestion of sleep apnea, ensuring to daytime fatigue if sleep is nonrestorative.

## 2016-09-19 ENCOUNTER — Ambulatory Visit: Payer: Medicare HMO | Admitting: Gastroenterology

## 2016-09-19 NOTE — Telephone Encounter (Signed)
LEFT DETAILED MESSAGE FOR PT STAFF MESSAGE SENT TO SCHEDULING FOR SLEEP APPT

## 2016-09-19 NOTE — Telephone Encounter (Signed)
F/u message  Pt returning RN call about scheduling sleep appt. Please call back to discuss

## 2016-09-21 NOTE — Telephone Encounter (Signed)
10/29  @ 9 am

## 2016-09-26 ENCOUNTER — Telehealth: Payer: Self-pay

## 2016-09-26 NOTE — Telephone Encounter (Signed)
-----   Message from Corinna Lines sent at 09/20/2016  2:16 PM EDT ----- Regarding: labs   I forgot to tell you that the patient would like to know if she could get a call back about her lab results.  Thanks [facility]

## 2016-10-09 NOTE — Telephone Encounter (Signed)
See result note-patient aware 

## 2016-10-11 ENCOUNTER — Telehealth: Payer: Self-pay | Admitting: Cardiovascular Disease

## 2016-10-11 NOTE — Telephone Encounter (Signed)
Spoke with pt she states that at last chol labs she states that she modified her diet and was eating lentils and rice for dinner everyday and chol went down but she states that her weight is up more than 10#, she has modified her diet again and has added more fish to her diet. She will continue until upcoming appt to discuss this with Dr Claiborne Billings 11-29-16 appt.  Also, She thinks that the losartan is causing insomnia she states that she "passes out" at bedtime but wakes 1-3am sometimes she can go back to sleep but stays up when she awakes at 3am but is tired all day.she cannot take melatonin this makes her "black out" the next day. She has sleep apnea but states that she cannot use a machine due to her overbite and having to have HOB elevated.

## 2016-10-11 NOTE — Telephone Encounter (Signed)
Patient on losartan since 2015 and follow up for sleep apnea pending with Dr Claiborne Billings.   Need Dr Evette Georges input for current insomnia and weight gain problems.   Insomnia is not a common ADR associate with losartan.

## 2016-10-11 NOTE — Telephone Encounter (Signed)
New message   Pt c/o medication issue:  1. Name of Medication: losartan (COZAAR) 25 MG tablet, atorvastatin (LIPITOR) 20 MG tablet  2. How are you currently taking this medication (dosage and times per day)? 25mg   3. Are you having a reaction (difficulty breathing--STAT)?   4. What is your medication issue? Losartan causing insomnia which is also a side effect. Also cholesterol atorvastatin (LIPITOR) 20 MG tablet medicine that pt is on is causing weight gain. She is exercising and watching what she eats and its not working.

## 2016-10-11 NOTE — Telephone Encounter (Signed)
Pt notified of [place] message she states that she will discuss this with Dr Claiborne Billings at upcoming appt-10-29 @ 9am

## 2016-10-17 NOTE — Telephone Encounter (Signed)
I do not believe the insomnia is related to losartan.  Discussed with patient improvement in sleep hygiene, which may be playing a factor.

## 2016-10-18 NOTE — Telephone Encounter (Signed)
S/w pt she states that she is still taking losartan. She will discuss sleep apnea-fatigue is a little better. Pt is concerned about sweating when she is "getting ready" she states that she has taken her BP 124/65 HR 50's-60 avg while sweating, denies SOB during these episodes. She states that she walks 1 mile a day. She states that she cannot afford the CPAP machine due to insurance coverage. She will discuss this at her upcoming appt 10-29

## 2016-10-18 NOTE — Telephone Encounter (Signed)
ok 

## 2016-10-23 DIAGNOSIS — R69 Illness, unspecified: Secondary | ICD-10-CM | POA: Diagnosis not present

## 2016-11-02 ENCOUNTER — Other Ambulatory Visit: Payer: Self-pay | Admitting: Family Medicine

## 2016-11-29 ENCOUNTER — Ambulatory Visit: Payer: Medicare HMO | Admitting: Family Medicine

## 2016-12-11 ENCOUNTER — Ambulatory Visit (INDEPENDENT_AMBULATORY_CARE_PROVIDER_SITE_OTHER): Payer: Medicare HMO | Admitting: Cardiovascular Disease

## 2016-12-11 ENCOUNTER — Encounter: Payer: Self-pay | Admitting: Cardiovascular Disease

## 2016-12-11 VITALS — BP 114/72 | HR 60 | Ht 63.0 in | Wt 167.4 lb

## 2016-12-11 DIAGNOSIS — G4700 Insomnia, unspecified: Secondary | ICD-10-CM | POA: Diagnosis not present

## 2016-12-11 DIAGNOSIS — K219 Gastro-esophageal reflux disease without esophagitis: Secondary | ICD-10-CM | POA: Diagnosis not present

## 2016-12-11 DIAGNOSIS — G478 Other sleep disorders: Secondary | ICD-10-CM | POA: Diagnosis not present

## 2016-12-11 DIAGNOSIS — G4719 Other hypersomnia: Secondary | ICD-10-CM

## 2016-12-11 DIAGNOSIS — I10 Essential (primary) hypertension: Secondary | ICD-10-CM

## 2016-12-11 DIAGNOSIS — R635 Abnormal weight gain: Secondary | ICD-10-CM

## 2016-12-11 DIAGNOSIS — I2510 Atherosclerotic heart disease of native coronary artery without angina pectoris: Secondary | ICD-10-CM | POA: Diagnosis not present

## 2016-12-11 DIAGNOSIS — R0683 Snoring: Secondary | ICD-10-CM

## 2016-12-11 MED ORDER — ZOLPIDEM TARTRATE 5 MG PO TABS: 5.0000 mg | ORAL_TABLET | Freq: Every evening | ORAL | 1 refills | Status: DC | PRN

## 2016-12-11 MED ORDER — SPIRONOLACTONE 25 MG PO TABS: 12.5000 mg | ORAL_TABLET | Freq: Every day | ORAL | 3 refills | Status: DC

## 2016-12-11 NOTE — Patient Instructions (Signed)
Medication Instructions:  STOP Losartan  START spironolactone 12.5 mg (1/2 tablet) daily  Take Ambien 5 mg (1 tablet) as needed at bedtime  Testing/Procedures: Your physician has recommended that you have a sleep study. This test records several body functions during sleep, including: brain activity, eye movement, oxygen and carbon dioxide blood levels, heart rate and rhythm, breathing rate and rhythm, the flow of air through your mouth and nose, snoring, body muscle movements, and chest and belly movement.  Follow-Up: Your physician recommends that you schedule a follow-up appointment in: 4 months with Dr. Claiborne Billings in sleep clinic.   Any Other Special Instructions Will Be Listed Below (If Applicable).     If you need a refill on your cardiac medications before your next appointment, please call your pharmacy.

## 2016-12-11 NOTE — Progress Notes (Signed)
Patient ID: [name] [name], female   DOB: 1950-04-21, 66 y.o.   MRN: [id number]     HPI: [name] [name] is a 66 y.o. female who presents to the office today for a 19 month follow up cardiology evaluation.  [name] has CAD and underwent cardiac catheterization on 05/22/2011 which revealed 70% distal left main stenosis, 60% ostial circumflex stenosis and mild RCA disease.  She underwent CABG surgery x2 by Dr. Nils Pyle and had a LIMA placed to her LAD, and vein to the OM 2 vessel.  Ejection fraction was 50-55%.  In October 2014, she  noticed some vague chest fullness and underwent a nuclear perfusion study which demonstrated hyperdynamic LV with an EF of 83% post stress.  She did not have ECG changes; she had normal perfusion and function.  When I last saw her in October 2016  she has been without chest pain.  She has a history of vitamin D insufficiency in the past which had improved.  She has issues with muscle spasm for which and was taking Flexeril on an as-needed basis.  She has GERD for which he takes Protonix on an as-needed basis.  She has been maintained on Lipitor 10 mg for hyperlipidemia.  She had issues with diarrhea.  At that time had stopped her losartan.  Since I last saw her, she has been without chest pain.  She does have right hip discomfort.  She underwent an echo Doppler study in October 2016 which showed an EF of 65-70% with grade 1 diastolic dysfunction.  There was mild TR, and otherwise no significant valvular pathology.  She is unaware of any palpitations.  She denies PND, orthopnea.  She had blood work one month ago by her primary physician and her TSH was 1.5.  BUN and creatinine were 15 and 0.73, respectively.  LFTs were normal, total cholesterol 174 with triglycerides 115, HDL 68, LDL 83.    As I last saw her, she states that she was having issues with insomnia and was concerned that this could've been secondary to losartan.  She has been taking Benadryl for sleep.  Typically she  has been going to bed at 9:30, but typically may stay awake and total 1 AM.  If she goes to bed at 1 AM she falls asleep much more quickly.  She has noticed poor sleep, frequent awakenings, daytime fatigue, and snoring.  She has been sleeping with the head of the bed elevated.  She has an extreme overbite.  She had called the office and now presents for evaluation.  Past Surgical History:  Procedure Laterality Date  . ABDOMINAL HYSTERECTOMY  2003  . CARDIAC CATHETERIZATION  05/22/2011  . CORONARY ARTERY BYPASS GRAFT  05/26/2011   Procedure: CORONARY ARTERY BYPASS GRAFTING (CABG);  Surgeon: Ivin Poot, MD;  Location: [facility];  Service: Open Heart Surgery;  Laterality: N/A;  Coronary Artery Bypass Graft on Pump times two utlizing left internal mammary artery and right greater saphenous vein harvested endoscopically   . CORONARY ARTERY BYPASS GRAFT     done by Dr. Kerby Less  . LEFT HEART CATHETERIZATION WITH CORONARY ANGIOGRAM N/A 05/24/2011   Procedure: LEFT HEART CATHETERIZATION WITH CORONARY ANGIOGRAM;  Surgeon: Leonie Man, MD;  Location: [facility] CATH LAB;  Service: Cardiovascular;  Laterality: N/A;  . TONSILLECTOMY  1962  . TUBAL LIGATION      Allergies  Allergen Reactions  . Amlodipine Swelling  . Cetirizine & Related Other (See Comments)    Extreme  Drowsiness - "knocks me loopy for 2 days"  . Latex Rash  . Levofloxacin Nausea And Vomiting  . Lisinopril Other (See Comments)    Excessive mucus production  . Prednisone Other (See Comments)    High dose - hallucinations    Current Outpatient Prescriptions  Medication Sig Dispense Refill  . aspirin 81 MG tablet Take 81 mg by mouth daily.    Marland Kitchen atorvastatin (LIPITOR) 20 MG tablet Take 20 mg by mouth daily.    . cyclobenzaprine (FLEXERIL) 5 MG tablet Take 1 tablet (5 mg total) by mouth 3 (three) times daily as needed for muscle spasms. 21 tablet 0  . losartan (COZAAR) 25 MG tablet TAKE 1 TABLET BY MOUTH DAILY 90 tablet 1  . pantoprazole  (PROTONIX) 40 MG tablet TAKE 1 TABLET BY MOUTH EVERY DAY 90 tablet 0   No current facility-administered medications for this visit.     Social History   Social History  . Marital status: Divorced    Spouse name: N/A  . Number of children: N/A  . Years of education: N/A   Occupational History  . Not on file.   Social History Main Topics  . Smoking status: Former Smoker    Quit date: 05/23/2006  . Smokeless tobacco: Never Used  . Alcohol use 0.0 oz/week     Comment: rare glass of wine  . Drug use: No  . Sexual activity: Not Currently    Birth control/ protection: Post-menopausal   Other Topics Concern  . Not on file   Social History Narrative  . No narrative on file    Family History  Problem Relation Age of Onset  . Stroke Mother   . Diabetes Mother        history of diabetes  . Heart failure Mother   . Cancer Father 29       bone cancer  . Drug abuse Sister   . Cancer Brother        throat  . Alcohol abuse Sister   . Colon cancer Neg Hx     ROS General: Negative; No fevers, chills, or night sweats HEENT: Negative; No changes in vision or hearing, sinus congestion, difficulty swallowing Pulmonary: Negative; No cough, wheezing, shortness of breath, hemoptysis Cardiovascular: See HPI: No chest pain, presyncope, syncope, palpatations GI: Negative; No nausea, vomiting, diarrhea, or abdominal pain GU: Negative; No dysuria, hematuria, or difficulty voiding Musculoskeletal: Positive for occasional muscle spasm Hematologic: Negative; no easy bruising, bleeding Endocrine: Negative; no heat/cold intolerance; no diabetes, Neuro: Negative; no changes in balance, headaches Skin: Negative; No rashes or skin lesions Psychiatric: Negative; No behavioral problems, depression Sleep: Negative; No snoring,  daytime sleepiness, hypersomnolence, bruxism, restless legs, hypnogognic hallucinations. Other comprehensive 14 point system review is negative   Physical Exam BP 114/72    Pulse 60   Ht 5\' 3"  (1.6 m)   Wt 167 lb 6.4 oz (75.9 kg)   SpO2 97%   BMI 29.65 kg/m    Repeat blood pressure by me was 136/70  Wt Readings from Last 3 Encounters:  12/11/16 167 lb 6.4 oz (75.9 kg)  07/07/16 164 lb (74.4 kg)  05/29/16 170 lb (77.1 kg)   General: Alert, oriented, no distress.  Skin: normal turgor, no rashes, warm and dry HEENT: Normocephalic, atraumatic. Pupils equal round and reactive to light; sclera anicteric; extraocular muscles intact;  Nose without nasal septal hypertrophy Mouth/Parynx benign; Mallinpatti scale 2 Neck: No JVD, no carotid bruits; normal carotid upstroke Lungs: clear to ausculatation  and percussion; no wheezing or rales Chest wall: without tenderness to palpitation Heart: PMI not displaced, RRR, s1 s2 normal, 1/6 systolic murmur, no diastolic murmur, no rubs, gallops, thrills, or heaves Abdomen: soft, nontender; no hepatosplenomehaly, BS+; abdominal aorta nontender and not dilated by palpation. Back: no CVA tenderness Pulses 2+ Musculoskeletal: full range of motion, normal strength, no joint deformities Extremities: no clubbing cyanosis or edema, Homan's sign negative  Neurologic: grossly nonfocal; Cranial nerves grossly wnl Psychologic: Normal mood and affect  ECG (independently read by me): Sinus bradycardia at 58 bpm.  Nonspecific ST-T changes.  QTc interval 394 ms.  PR interval 140 ms.  October 2016 ECG (independently read by me): Sinus bradycardia 53 bpm.  No ectopy.  Normal intervals.  August 2015 ECG (independently read by me): Sinus bradycardia at 47 beats per minute.  Nonspecific T changes.  QTc interval   385 ms  LABS: BMP Latest Ref Rng & Units 09/18/2016 07/08/2016 05/29/2016  Glucose 65 - 99 mg/dL 87 98 92  BUN 8 - 27 mg/dL '16 12 15  '$ Creatinine 0.57 - 1.00 mg/dL 0.76 0.81 0.73  BUN/Creat Ratio 12 - 28 21 - 21  Sodium 134 - 144 mmol/L 140 137 143  Potassium 3.5 - 5.2 mmol/L 4.6 3.7 4.7  Chloride 96 - 106 mmol/L 104 105 102    CO2 20 - 29 mmol/L '23 24 23  '$ Calcium 8.7 - 10.3 mg/dL 8.6(L) 9.3 9.5   Hepatic Function Latest Ref Rng & Units 09/18/2016 07/08/2016 05/29/2016  Total Protein 6.0 - 8.5 g/dL 6.1 6.8 6.7  Albumin 3.6 - 4.8 g/dL 4.2 4.5 4.5  AST 0 - 40 IU/L '21 17 15  '$ ALT 0 - 32 IU/L 18 11(L) 14  Alk Phosphatase 39 - 117 IU/L 78 68 82  Total Bilirubin 0.0 - 1.2 mg/dL 0.3 0.7 0.3   CBC Latest Ref Rng & Units 07/08/2016 10/15/2015 11/18/2014  WBC 4.0 - 10.5 K/uL 10.3 6.3 5.8  Hemoglobin 12.0 - 15.0 g/dL 14.8 14.0 14.6  Hematocrit 36.0 - 46.0 % 45.0 42.9 42.3  Platelets 150 - 400 K/uL 377 330 410(H)   Lab Results  Component Value Date   MCV 85.7 07/08/2016   MCV 86 10/15/2015   MCV 82.3 11/18/2014   Lab Results  Component Value Date   TSH 1.500 05/29/2016   Lab Results  Component Value Date   HGBA1C 6.0 04/08/2015   Lipid Panel     Component Value Date/Time   CHOL 138 09/18/2016 0815   TRIG 68 09/18/2016 0815   HDL 57 09/18/2016 0815   CHOLHDL 2.4 09/18/2016 0815   CHOLHDL 2.3 11/18/2014 0928   VLDL 15 11/18/2014 0928   LDLCALC 67 09/18/2016 0815   RADIOLOGY: No results found.  IMPRESSION:  No diagnosis found.  ASSESSMENT AND PLAN: Ms. Brandyce Dimario is a very pleasant 66 year old female, who is tatus post CABG revascularization surgery in April 2013 after she was found to have high-grade left main stenosis.  A  2014 nuclear study showed normal perfusion and hyperdynamic LV function.  Remotely, she had issues with diarrhea and felt perhaps this was related to losartan.  More recently, she has had issues with insomnia and was concerned that this may be related to losartan therapy.  She has been taking Benadryl to help her fall sleep.  Her sleep is poor and is associated with frequent awakenings and recently out snoring.  I had a long discussion with her in the office today.  I have recommended  discontinuance of losartan.  I will try adding low-dose spironolactone at 12.5 mg to see if this can  improve blood pressure.  She is not having anginal symptoms.  I recommended a trial of zolpidem 5 mg at bedtime to see if this can significant improve her sleep initiation.  It is my recommendation that she undergo a sleep study to make certain she does not also have obstructive sleep apnea contributing to her poor sleep pattern.  I will see her in the office in follow-up and further recommendations will made at that time.  She continues to take atorvastatin 20, now grams for hyperlipidemia and most recent LDL was 67.  She is on baby aspirin.  GERD is controlled with pantoprazole.  She is unaware of any nocturnal reflux.  I will schedule her four-month follow-up sleep clinic evaluation.  Time spent: 30 minutes Troy Sine, MD, [facility]  12/11/2016 9:03 AM

## 2016-12-19 ENCOUNTER — Telehealth: Payer: Self-pay | Admitting: Cardiovascular Disease

## 2016-12-19 ENCOUNTER — Encounter: Payer: Self-pay | Admitting: Family Medicine

## 2016-12-19 ENCOUNTER — Ambulatory Visit (INDEPENDENT_AMBULATORY_CARE_PROVIDER_SITE_OTHER): Payer: Medicare Other | Admitting: Family Medicine

## 2016-12-19 VITALS — BP 133/74 | HR 52 | Temp 97.2°F | Ht 63.0 in | Wt 165.0 lb

## 2016-12-19 DIAGNOSIS — L570 Actinic keratosis: Secondary | ICD-10-CM | POA: Diagnosis not present

## 2016-12-19 DIAGNOSIS — L820 Inflamed seborrheic keratosis: Secondary | ICD-10-CM

## 2016-12-19 MED ORDER — NAPROXEN 500 MG PO TABS: 500.0000 mg | ORAL_TABLET | Freq: Two times a day (BID) | ORAL | 0 refills | Status: DC | PRN

## 2016-12-19 NOTE — Patient Instructions (Addendum)
I value your feedback and appreciate you entrusting [place] with your care.  If you get a survey, I would appreciate your taking the time to let us know what your experience was like.   Go ahead and schedule an appointment with a dermatologist.  If you need a referral please call me and I am happy to place this for you.  Today, you had one actinic keratosis along her neck and one seborrheic keratosis on your bra line treated with cryotherapy.  You may experience some inflammation/blistering and burning sensation at these areas.  This is completely normal.   Cryosurgery for Skin Conditions, Care After This sheet gives you information about how to care for yourself after your procedure. Your health care provider may also give you more specific instructions. If you have problems or questions, contact your health care provider. What can I expect after the procedure? After your procedure, it is common to have redness, swelling, and a blister that forms over the treated area. The blister may contain a small amount of blood. After about 2 weeks, the blister will break on its own, leaving a scab. Then the treated area will heal. After healing, there is usually little or no scarring. Follow these instructions at home: Caring for the treated area  Follow instructions from your health care provider about how to take care of the treated area. Make sure you: ? Keep the area covered with a bandage (dressing) until it heals, or for as long as told by your health care provider. ? Wash your hands with soap and water before you change your dressing. If soap and water are not available, use hand sanitizer. ? Change your dressing as told by your health care provider. ? Keep the dressing and the treated area clean and dry. If the dressing gets wet, change it right away. ? Clean the treated area with soap and water.  Check the treated area every day for signs of infection. Check for: ? More redness, swelling, or  pain. ? More fluid or blood. ? Warmth. ? Pus or a bad smell. General instructions  Do not pick at your blister or try to break it open. This can cause infection and scarring.  Do not apply any medicine, cream, or lotion to the treated area unless directed by your health care provider.  Take over-the-counter and prescription medicines only as told by your health care provider.  Keep all follow-up visits as told by your health care provider. This is important. Contact a health care provider if:  You have more redness, swelling, or pain around the treated area.  You have more fluid or blood coming from the treated area.  The treated area feels warm to the touch.  You have pus or a bad smell coming from the treated area.  Your blister becomes large and painful. Get help right away if:  You have a fever and have redness spreading from the treated area. Summary  The treated area will become red and swollen shortly after the procedure.  You should keep the treated area and your dressing clean and dry.  Check the treated area every day for signs of infection, such as fluid, pus, warmth, or having more redness, swelling, or pain.  Do not pick at your blister or try to break it open. This information is not intended to replace advice given to you by your health care provider. Make sure you discuss any questions you have with your health care provider. Document Released: 08/19/2004  Document Revised: 12/20/2015 Document Reviewed: 12/20/2015 Elsevier Interactive Patient Education  2017 [employer] Seborrheic Keratosis Seborrheic keratosis is a common, noncancerous (benign) skin growth. This condition causes waxy, rough, tan, brown, or black spots to appear on the skin. These skin growths can be flat or raised. What are the causes? The cause of this condition is not known. What increases the risk? This condition is more likely to develop in:  People who have a family history of  seborrheic keratosis.  People who are 38 or older.  People who are pregnant.  People who have had estrogen replacement therapy.  What are the signs or symptoms? This condition often occurs on the face, chest, shoulders, back, or other areas. These growths:  Are usually painless, but may become irritated and itchy.  Can be yellow, brown, black, or other colors.  Are slightly raised or have a flat surface.  Are sometimes rough or wart-like in texture.  Are often waxy on the surface.  Are round or oval-shaped.  Sometimes look like they are "stuck on."  Often occur in groups, but may occur as a single growth.  How is this diagnosed? This condition is diagnosed with a medical history and physical exam. A sample of the growth may be tested (skin biopsy). You may need to see a skin specialist (dermatologist). How is this treated? Treatment is not usually needed for this condition, unless the growths are irritated or are often bleeding. You may also choose to have the growths removed if you do not like their appearance. Most commonly, these growths are treated with a procedure in which liquid nitrogen is applied to "freeze" off the growth (cryosurgery). They may also be burned off with electricity or cut off. Follow these instructions at home:  Watch your growth for any changes.  Keep all follow-up visits as told by your health care provider. This is important.  Do not scratch or pick at the growth or growths. This can cause them to become irritated or infected. Contact a health care provider if:  You suddenly have many new growths.  Your growth bleeds, itches, or hurts.  Your growth suddenly becomes larger or changes color. This information is not intended to replace advice given to you by your health care provider. Make sure you discuss any questions you have with your health care provider. Document Released: 03/04/2010 Document Revised: 07/08/2015 Document Reviewed:  06/17/2014 Elsevier Interactive Patient Education  2017 [employer]  Actinic Keratosis An actinic keratosis is a precancerous growth on the skin. This means that it could develop into skin cancer if it is not treated. About 1% of these growths (actinic keratoses) turn into skin cancer within one year if they are not treated. It is important to have all of these growths evaluated to determine the best treatment approach. What are the causes? This condition is caused by getting too much ultraviolet (UV) radiation from the sun or other UV light sources. What increases the risk? The following factors may make you more likely to develop this condition:  Having light-colored skin and blue eyes.  Having blonde or red hair.  Spending a lot of time in the sun.  Inadequate skin protection when outdoors. This may include: ? Not using sunscreen properly. ? Not covering up skin that is exposed to sunlight.  Aging. The risk of developing an actinic keratosis increases with age.  What are the signs or symptoms? Actinic keratoses look like scaly, rough spots of skin.They can be as small as  a pinhead or as big as a quarter. They may itch, hurt, or feel sensitive. In most cases, the growths become red. In some cases, they may be skin-colored, light tan, dark tan, pink, or a combination of any of these colors. There may be a small piece of pink or gray skin (skin tag) growing from the actinic keratosis. In some cases, it may be easier to notice actinic keratoses by feeling them, rather than seeing them. Actinic keratoses appear most often on areas of skin that get a lot of sun exposure, including the scalp, face, ears, lips, upper back, forearms, and the backs of the hands. Sometimes, actinic keratoses disappear, but many reappear a few days to a few weeks later. How is this diagnosed? This condition is usually diagnosed with a physical exam. A tissue sample may be removed from the actinic keratosis and  examined under a microscope (biopsy). How is this treated?  Treatment for this condition may include:  Scraping off the actinic keratosis (curettage).  Freezing the actinic keratosis with liquid nitrogen (cryosurgery). This causes the growth to eventually fall off the skin.  Applying medicated creams or gels to destroy the cells in the growth.  Applying chemicals to the actinic keratosis to make the outer layers of skin peel off (chemical peel).  Photodynamic therapy. In this procedure, medicated cream is applied to the actinic keratosis. This cream increases your skin's sensitivity to light. Then, a strong light is aimed at the actinic keratosis to destroy cells in the growth.  Follow these instructions at home: Skin care  Apply cool, wet cloths (cool compresses) to the affected areas.  Do not scratch your skin.  Check your skin regularly for any growths, especially growths that: ? Start to itch or bleed. ? Change in size, shape, or color. Caring for the treated area  Keep the treated area clean and dry as told by your health care provider.  Do not apply any medicine, cream, or lotion to the treated area unless your health care provider tells you to do that.  Do not pick at blisters or try to break them open. This can cause infection and scarring.  If you have red or irritated skin after treatment, follow instructions from your health care provider about how to take care of the treated area. Make sure you: ? Wash your hands with soap and water before you change your bandage (dressing). If soap and water are not available, use hand sanitizer. ? Change your dressing as told by your health care provider.  If you have red or irritated skin after treatment, check your treated area every day for signs of infection. Check for: ? Swelling, pain, or more redness. ? Fluid or blood. ? Warmth. ? Pus or a bad smell. General instructions  Take over-the-counter and prescription  medicines only as told by your health care provider.  Return to your normal activities as told by your health care provider. Ask your health care provider what activities are safe for you.  Do not use any tobacco products, such as cigarettes, chewing tobacco, and e-cigarettes. If you need help quitting, ask your health care provider.  Have a skin exam done every year by a health care provider who is a skin conditions specialist (dermatologist).  Keep all follow-up visits as told by your health care provider. This is important. How is this prevented?  Do not get sunburns.  Try to avoid the sun between 10:00 a.m. and 4:00 p.m. This is when the UV  light is the strongest.  Use a sunscreen or sunblock with SPF 30 (sun protection factor 30) or greater.  Apply sunscreen before you are exposed to sunlight, and reapply periodically as often as directed by the instructions on the sunscreen container.  Always wear sunglasses that have UV protection, and always wear hats and clothing to protect your skin from sunlight.  When possible, avoid medicines that increase your sensitivity to sunlight. These include: ? Certain antibiotic medicines. ? Certain water pills (diuretics). ? Certain prescription medicines that are used to treat acne (retinoids).  Do not use tanning beds or other indoor tanning devices. Contact a health care provider if:  You notice any changes or new growths on your skin.  You have swelling, pain, or more redness around your treated area.  You have fluid or blood coming from your treated area.  Your treated area feels warm to the touch.  You have pus or a bad smell coming from your treated area.  You have a fever.  You have a blister that becomes large and painful. This information is not intended to replace advice given to you by your health care provider. Make sure you discuss any questions you have with your health care provider. Document Released: 04/28/2008  Document Revised: 10/01/2015 Document Reviewed: 10/10/2014 Elsevier Interactive Patient Education  [employer].

## 2016-12-19 NOTE — Progress Notes (Signed)
Subjective: CC: "place on side" PCP: Dettinger, Fransisca Kaufmann, MD HPI:[name] [name] is a 66 y.o. female presenting to clinic today for:  1. Skin lesions Patient reports 2 skin lesions of concern.  The first is located on her right side near her bra line.  She notes that this seems to be irritated.  She is unsure if it is growing, changing color or changing shape as she cannot see it well.  She denies spontaneous bleeding.  The second lesion is at the nape of her neck.  She notes that this 1 feels crusty.  Again she is unsure if it is changing color, shape or size as she cannot see it.  She does report a significant history of sun exposure with inadequate sun protection.  She denies personal history or family history of skin cancer, including melanoma.  Denies fevers, chills, unplanned weight loss.  She is a former smoker that quit in 2008.  She did attempt to schedule an appointment with a dermatologist but unfortunately was not able to get in until January.  Allergies  Allergen Reactions  . Amlodipine Swelling  . Cetirizine & Related Other (See Comments)    Extreme Drowsiness - "knocks me loopy for 2 days"  . Latex Rash  . Levofloxacin Nausea And Vomiting  . Lisinopril Other (See Comments)    Excessive mucus production  . Prednisone Other (See Comments)    High dose - hallucinations   Past Medical History:  Diagnosis Date  . Anxiety   . Chronic kidney disease    cyst left kidney  . Complication of anesthesia    difficult waking up "  . Depression   . GERD (gastroesophageal reflux disease)   . Heart murmur    as child  . Hemorrhoids    will occ bleed   . Hypertension   . IBS (irritable bowel syndrome)    Family History  Problem Relation Age of Onset  . Stroke Mother   . Diabetes Mother        history of diabetes  . Heart failure Mother   . Cancer Father 55       bone cancer  . Drug abuse Sister   . Cancer Brother        throat  . Alcohol abuse Sister   . Colon cancer Neg  Hx     Current Outpatient Medications:  .  aspirin 81 MG tablet, Take 81 mg by mouth daily., Disp: , Rfl:  .  atorvastatin (LIPITOR) 20 MG tablet, Take 20 mg by mouth daily., Disp: , Rfl:  .  cyclobenzaprine (FLEXERIL) 5 MG tablet, Take 1 tablet (5 mg total) by mouth 3 (three) times daily as needed for muscle spasms., Disp: 21 tablet, Rfl: 0 .  naproxen (NAPROSYN) 500 MG tablet, Take 500 mg 2 (two) times daily as needed by mouth., Disp: , Rfl:  .  pantoprazole (PROTONIX) 40 MG tablet, TAKE 1 TABLET BY MOUTH EVERY DAY, Disp: 90 tablet, Rfl: 0 .  spironolactone (ALDACTONE) 25 MG tablet, Take 0.5 tablets (12.5 mg total) by mouth daily., Disp: 45 tablet, Rfl: 3 .  zolpidem (AMBIEN) 5 MG tablet, Take 1 tablet (5 mg total) by mouth at bedtime as needed for sleep., Disp: 30 tablet, Rfl: 1  Social Hx: former smoker.  Health Maintenance: Flu shot done   ROS: Per HPI  Objective: Office vital signs reviewed. BP 133/74   Pulse (!) 52   Temp (!) 97.2 F (36.2 C) (Oral)  Ht 5\' 3"  (1.6 m)   Wt 165 lb (74.8 kg)   BMI 29.23 kg/m   Physical Examination:  General: Awake, alert, well nourished, well appearing female, No acute distress Skin: dry; there is a 2 mm crusting lesion at approximately the T1 level.  This is a nonpigmented lesion.  There is no surrounding erythema. There is a 3.5 mm lesion along the right side along her bra line at approximately rib 9 that is consistent with an inflamed seborrheic keratosis. Psych: Calm affect appropriate, pleasant   Cryotherapy Procedure Note  Pre-operative Diagnosis: Actinic keratosis, Seborrheic keratosis  Post-operative Diagnosis: Actinic keratosis, Seborrheic keratosis  Locations: AK:  T1 level.; SK: right rib 9  Indications: AK: precancerous; SK: inflammed  Anesthesia: not required   Procedure Details  Patient informed of risks (permanent scarring, infection, light or dark discoloration, bleeding, infection, weakness, numbness and  recurrence of the lesion) and benefits of the procedure and written informed consent obtained.  The areas are treated with liquid nitrogen therapy, frozen until ice ball extended 2 mm beyond lesion, allowed to thaw, and treated again. The patient tolerated procedure well.  No bleeding occurred.  No immediate complications.  Patient tolerated procedure well.  The patient was instructed on post-op care, warned that there may be blister formation, redness and pain. Recommend OTC analgesia as needed for pain.    Assessment/ Plan: 66 y.o. female   1. Actinic keratosis Lesion along neck appears to be an actinic keratosis.  This was treated with cryo today in office.  See above note.  Home care instructions were reviewed with patient and a copy was provided to her.  I did encourage her to schedule an appt with Dermatology for continued surveillance of her skin, as she has several nevi/ freckles along her back and chest.  She will call me if she needs a referral.  Return precautions and reasons for emergent evaluation in the emergency department review with patient.  They voiced understanding and will follow-up as needed.   2. Inflamed seborrheic keratosis Discussed the benign nature of SKs.  I did treat the lesion along her bra line w/ cryo today because it was irritated.  Home care instructions reviewed.   No orders of the defined types were placed in this encounter.  Additionally, patient noted she needs refills on Naproxen, which she uses not more than 1 week every 2 months for low back pain/ sciatica.  We discussed the risk of NSAID use w/ ASA 81mg  and h/o CAD.  She voiced good understanding and will use this sparingly.  Meds ordered this encounter  Medications  . naproxen (NAPROSYN) 500 MG tablet    Sig: Take 500 mg 2 (two) times daily as needed by mouth.     Janora Norlander, DO [place] [phone]

## 2016-12-19 NOTE — Telephone Encounter (Signed)
Returned the call to the patient. She stated that her heart rate runs low (50-60's). She stated that it makes her nervous when she sleeps at night that it may be running too low.  She has an order for a sleep study but she stated that she cancelled that. She would rather wear a monitor than do the sleep study. She was educated on the importance of the sleep study but still declined. She would like the monitor instead. Will route to the provider for his recommendation.

## 2016-12-19 NOTE — Telephone Encounter (Signed)
New Message     Pt wants you to put an order in for a event monitor , to monitor her heart rate at night , she thinks it is dropping to low   STAT if HR is under 50 or over 120 (normal HR is 60-100 beats per minute)  1) What is your heart rate? 52 when she wakes up in morning  2) Do you have a log of your heart rate readings (document readings)?  No   3) Do you have any other symptoms? She is concerned with her heart rate dropping at night, in the day time it is 50-60

## 2016-12-22 NOTE — Telephone Encounter (Signed)
HR in the 50 - 60s while sleeping is normal.

## 2016-12-25 NOTE — Telephone Encounter (Signed)
Left a message to call back.

## 2016-12-26 NOTE — Telephone Encounter (Signed)
ok 

## 2016-12-26 NOTE — Telephone Encounter (Signed)
Returned the call to the patient to inform her of Dr. Evette Georges response. She verbalized her understanding. She stated that she would like for Dr. Claiborne Billings to know that she cannot afford the sleep study at this time due to out of pocket expensives.

## 2017-01-09 ENCOUNTER — Other Ambulatory Visit: Payer: Self-pay | Admitting: *Deleted

## 2017-01-09 ENCOUNTER — Telehealth: Payer: Self-pay

## 2017-01-09 DIAGNOSIS — Z1211 Encounter for screening for malignant neoplasm of colon: Secondary | ICD-10-CM

## 2017-01-09 MED ORDER — ATORVASTATIN CALCIUM 20 MG PO TABS: 20.0000 mg | ORAL_TABLET | Freq: Every day | ORAL | 1 refills | Status: DC

## 2017-01-09 MED ORDER — SPIRONOLACTONE 25 MG PO TABS: 12.5000 mg | ORAL_TABLET | Freq: Every day | ORAL | 1 refills | Status: DC

## 2017-01-09 NOTE — Telephone Encounter (Signed)
Referral to GI for colonoscopy ordered

## 2017-01-09 NOTE — Telephone Encounter (Signed)
Dr Dettinger patient  Wants a referral for colonoscopy to Dr Oneida Alar in [place]

## 2017-01-09 NOTE — Addendum Note (Signed)
Addended by: Evelina Dun A on: 01/09/2017 05:20 PM   Modules accepted: Orders

## 2017-01-12 ENCOUNTER — Other Ambulatory Visit: Payer: Self-pay | Admitting: Family Medicine

## 2017-01-12 ENCOUNTER — Other Ambulatory Visit: Payer: Self-pay | Admitting: Obstetrics and Gynecology

## 2017-01-12 DIAGNOSIS — Z1231 Encounter for screening mammogram for malignant neoplasm of breast: Secondary | ICD-10-CM

## 2017-01-15 ENCOUNTER — Telehealth: Payer: Self-pay

## 2017-01-15 NOTE — Telephone Encounter (Signed)
[phone]  Patient sent letter to schedule tcs, no gi issues or heart attacks within the last year

## 2017-01-16 ENCOUNTER — Encounter (HOSPITAL_BASED_OUTPATIENT_CLINIC_OR_DEPARTMENT_OTHER): Payer: Medicare HMO

## 2017-01-16 NOTE — Telephone Encounter (Signed)
Patient called back to speak with Tamela Oddi.

## 2017-01-17 ENCOUNTER — Encounter: Payer: Self-pay | Admitting: Family Medicine

## 2017-01-17 ENCOUNTER — Ambulatory Visit (INDEPENDENT_AMBULATORY_CARE_PROVIDER_SITE_OTHER): Payer: Medicare Other | Admitting: Family Medicine

## 2017-01-17 VITALS — BP 148/66 | HR 56 | Temp 98.7°F | Ht 63.0 in | Wt 168.0 lb

## 2017-01-17 DIAGNOSIS — F430 Acute stress reaction: Secondary | ICD-10-CM | POA: Diagnosis not present

## 2017-01-17 DIAGNOSIS — F418 Other specified anxiety disorders: Secondary | ICD-10-CM

## 2017-01-17 MED ORDER — CITALOPRAM HYDROBROMIDE 10 MG PO TABS: 10.0000 mg | ORAL_TABLET | Freq: Every day | ORAL | 0 refills | Status: DC

## 2017-01-17 NOTE — Patient Instructions (Signed)
I sent in Celexa for you to start taking today.  The lowest dose available is 10 mg.  We will start you low and slow and reconvene in 4 weeks to make sure that you are tolerating this dose well.  I highly recommend that you do see the therapist and possibly psychiatrist for further evaluation of other symptoms.  Taking the medicine as directed and not missing any doses is one of the best things you can do to treat your depression.  Here are some things to keep in mind:  1) Side effects (stomach upset, some increased anxiety) may happen before you notice a benefit.  These side effects typically go away over time. 2) Changes to your dose of medicine or a change in medication all together is sometimes necessary 3) Most people need to be on medication at least 12 months 4) Many people will notice an improvement within two weeks but the full effect of the medication can take up to 4-6 weeks 5) Stopping the medication when you start feeling better often results in a return of symptoms 6) Never discontinue your medication without contacting a health care professional first.  Some medications require gradual discontinuation/ taper and can make you sick if you stop them abruptly.  If your symptoms worsen or you have thoughts of suicide/homicide, PLEASE SEEK IMMEDIATE MEDICAL ATTENTION.  You may always call the National Suicide Hotline.  This is available 24 hours a day, 7 days a week.  Their phone number is: [phone]  Your provider wants you to schedule an appointment with a Psychologist/Psychiatrist. The following list of offices requires the patient to call and make their own appointment, as there is information they need that only you can provide. Please feel free to choose form the following providers:  [facility] in [place]  [facility]  [phone] [place], [place]  (Scheduled through [place])  Must call and do an interview for appointment. Sees Children / Accepts Medicaid  [facility] [place]  [address], [place]    [place], [place]  [phone] [place], [place] for Autism but does not treat it Sees Children / Accepts Medicaid  [facility]    [phone] [address], [place], [place] Medication management, substance abuse, bipolar, grief, family, marriage, OCD, anxiety, PTSD Sees children / Accepts Medicaid  [facility]    [phone] [address], [place], [place] children / Accepts [facility]  [facility]  [phone] [address] [place], [place]   Dr Lorenza Evangelist     [phone] [address] [place], [place]  Sees ADD & ADHD for treatment Accepts Medicaid  [facility]  [phone] [address] Arlean Hopping, [place] for Autism Accepts [facility]  [facility]  [phone] [place], [place]  Does Adult ADD evaluations Does not accept Medicaid  [facility]   [phone] [place], [place] children as young as 30 years old Accepts [facility]     [phone]    [place], [place] [address] Sees children Accepts Medicaid
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
## 2017-01-18 NOTE — Telephone Encounter (Signed)
I called pt and she said Dr. Olevia Perches did her last colonoscopy. She said her insurance told her that [place] is not on her insurance plan any longer and that is why she asked for a referral here. I told her her last colonoscopy was 08/07/2014 ( she had thought it was 2013). She is aware she is on recall from [facility] for 2021 and she will be notified by them at that time. She is not having any problems.  Sending FYI to Walden Field, NP.

## 2017-01-19 ENCOUNTER — Other Ambulatory Visit: Payer: Self-pay | Admitting: Family Medicine

## 2017-01-19 ENCOUNTER — Telehealth: Payer: Self-pay | Admitting: Family Medicine

## 2017-01-19 MED ORDER — FLUOXETINE HCL 10 MG PO CAPS
10.0000 mg | ORAL_CAPSULE | Freq: Every day | ORAL | 0 refills | Status: DC
Start: 2017-01-19 — End: 2017-02-16

## 2017-01-19 NOTE — Telephone Encounter (Signed)
May discontinue Celexa.  Prozac sent to pharmacy.

## 2017-01-20 NOTE — Telephone Encounter (Signed)
Patient aware and verbalizes understanding. 

## 2017-01-22 NOTE — Telephone Encounter (Signed)
Noted. Please put her on the [place] list to check in in 2021 if she will be at [facility] vs [facility]. Not sure abut the "not on insurance anymore" because we're all part of the same group ([facility])? She can contact us again for any symptoms.

## 2017-01-24 NOTE — Telephone Encounter (Signed)
PT is on Recall for [place] for 2021.

## 2017-01-31 ENCOUNTER — Other Ambulatory Visit: Payer: Self-pay | Admitting: *Deleted

## 2017-01-31 ENCOUNTER — Ambulatory Visit: Payer: Medicare Other | Admitting: Family Medicine

## 2017-01-31 MED ORDER — PANTOPRAZOLE SODIUM 40 MG PO TBEC: 40.0000 mg | DELAYED_RELEASE_TABLET | Freq: Every day | ORAL | 0 refills | Status: DC

## 2017-02-13 HISTORY — PX: COLONOSCOPY: SHX174

## 2017-02-13 HISTORY — PX: DIAGNOSTIC MAMMOGRAM: HXRAD719

## 2017-02-16 ENCOUNTER — Other Ambulatory Visit: Payer: Self-pay | Admitting: Family Medicine

## 2017-02-16 ENCOUNTER — Telehealth: Payer: Self-pay | Admitting: Family Medicine

## 2017-02-16 MED ORDER — FLUOXETINE HCL 10 MG PO CAPS: 10.0000 mg | ORAL_CAPSULE | Freq: Every day | ORAL | 0 refills | Status: DC

## 2017-02-16 NOTE — Telephone Encounter (Signed)
Please advise on refill.

## 2017-02-16 NOTE — Telephone Encounter (Signed)
That's great to hear.  I have sent in a month's refill.  I'd love to see her for follow up on this in the next few weeks.

## 2017-02-16 NOTE — Telephone Encounter (Signed)
Left message for patient to call  To schedule and medication was refilled.

## 2017-02-16 NOTE — Telephone Encounter (Signed)
What is the name of the medication? FLUoxetine (PROZAC) 10 MG capsule-Pt wants to let Lajuana Ripple know that this medication really helped her and that she only has two left Also wants a refill before the weekend if possible because of the withdraw side effects.  Have you contacted your pharmacy to request a refill? yes  Which pharmacy would you like this sent to? [facility] [place]   Patient notified that their request is being sent to the clinical staff for review and that they should receive a call once it is complete. If they do not receive a call within 24 hours they can check with their pharmacy or our office.

## 2017-02-21 NOTE — Progress Notes (Signed)
Subjective: CC: f/u Depression HPI: ARYIA DELIRA is a 67 y.o. female presenting to clinic today for:  1.  Stress reaction / depression Patient was seen on 01/17/2017 for depressive/anxiety symptoms.  Symptoms seem to be exacerbated by the approaching holiday season.  She had reported feelings of isolation and estrangement at that time.  She was planning to see a therapist.  She was started on Celexa, which she did not tolerate.  Medications was changed to Prozac, which she reports good tolerance and good response to.  Today she notes that her symptoms have essentially resolved.  She does report intermittent episodes of anxiety but she notes that symptoms are substantially better than previous.  She notes that she has had great improvement in sleep.  She is coping with stressors better.  She notes improved relationship with her daughter.  She reports that Christmas went very well and that she actually enjoyed herself.  She did not see the therapist in [place] but did try to establish with one in [place] who she thought was better qualified.  However, an appointment there was going to be at least $150 and she could not afford that at this time.  She denies diarrhea, abdominal pain; she does report a spell of acid reflux last week which made her nauseated.  At that time, she felt like her breathing was a little funny " like I had to take a deep breath in".  She notes that symptoms resolved with Protonix x1.  She denies chest pain, heart palpitations, shortness of breath, dyspnea exertion, change in activity tolerance, nausea, vomiting, dizziness, diaphoresis, lower extremity swelling.  ROS: Per HPI  Past Medical History:  Diagnosis Date  . Anxiety   . Complication of anesthesia    difficult waking up "  . Depression   . GERD (gastroesophageal reflux disease)   . Heart murmur    as child  . Hemorrhoids    will occ bleed   . Hypertension   . IBS (irritable bowel syndrome)   . Renal  cyst, left    cyst left kidney   Allergies  Allergen Reactions  . Amlodipine Swelling  . Cetirizine & Related Other (See Comments)    Extreme Drowsiness - "knocks me loopy for 2 days"  . Latex Rash  . Levofloxacin Nausea And Vomiting  . Lisinopril Other (See Comments)    Excessive mucus production  . Prednisone Other (See Comments)    High dose - hallucinations    Current Outpatient Medications:  .  aspirin 81 MG tablet, Take 81 mg by mouth daily., Disp: , Rfl:  .  atorvastatin (LIPITOR) 20 MG tablet, Take 1 tablet (20 mg total) by mouth daily., Disp: 90 tablet, Rfl: 1 .  cyclobenzaprine (FLEXERIL) 5 MG tablet, Take 1 tablet (5 mg total) by mouth 3 (three) times daily as needed for muscle spasms., Disp: 21 tablet, Rfl: 0 .  FLUoxetine (PROZAC) 10 MG capsule, Take 1 capsule (10 mg total) by mouth daily., Disp: 30 capsule, Rfl: 0 .  naproxen (NAPROSYN) 500 MG tablet, Take 1 tablet (500 mg total) 2 (two) times daily as needed by mouth., Disp: 30 tablet, Rfl: 0 .  pantoprazole (PROTONIX) 40 MG tablet, Take 1 tablet (40 mg total) by mouth daily., Disp: 90 tablet, Rfl: 0 .  spironolactone (ALDACTONE) 25 MG tablet, Take 0.5 tablets (12.5 mg total) by mouth daily., Disp: 45 tablet, Rfl: 1 .  zolpidem (AMBIEN) 5 MG tablet, Take 1 tablet (5 mg total) by  mouth at bedtime as needed for sleep. (Patient not taking: Reported on 01/17/2017), Disp: 30 tablet, Rfl: 1 Social History   Socioeconomic History  . Marital status: Divorced    Spouse name: Not on file  . Number of children: Not on file  . Years of education: Not on file  . Highest education level: Not on file  Social Needs  . Financial resource strain: Not on file  . Food insecurity - worry: Not on file  . Food insecurity - inability: Not on file  . Transportation needs - medical: Not on file  . Transportation needs - non-medical: Not on file  Occupational History  . Not on file  Tobacco Use  . Smoking status: Former Smoker    Last  attempt to quit: 05/23/2006    Years since quitting: 10.7  . Smokeless tobacco: Never Used  Substance and Sexual Activity  . Alcohol use: Yes    Alcohol/week: 0.0 oz    Comment: rare glass of wine  . Drug use: No  . Sexual activity: Not Currently    Birth control/protection: Post-menopausal  Other Topics Concern  . Not on file  Social History Narrative  . Not on file   Family History  Problem Relation Age of Onset  . Stroke Mother   . Diabetes Mother        history of diabetes  . Heart failure Mother   . Cancer Father 45       bone cancer  . Drug abuse Sister   . Cancer Brother        throat  . Alcohol abuse Sister   . Colon cancer Neg Hx     Health Maintenance: PNA, TDap  Objective: Office vital signs reviewed. BP 139/69   Pulse (!) 56   Temp 97.6 F (36.4 C) (Oral)   Ht 5\' 3"  (1.6 m)   Wt 165 lb (74.8 kg)   BMI 29.23 kg/m   Physical Examination:  General: Awake, alert, well nourished, well appearing, No acute distress HEENT: sclera white, MMM Cardio: bradycardic w/ regular rhythm, S1S2 heard, no murmurs appreciated Pulm: clear to auscultation bilaterally, no wheezes, rhonchi or rales; normal work of breathing on room air Psych: Mood stable, speech normal, affect appropriate, pleasant, interactive, does not appear to be responding to internal stimuli.  Depression screen [facility] 2/9 02/22/2017 01/17/2017 12/19/2016  Decreased Interest 0 3 0  Down, Depressed, Hopeless 0 3 0  PHQ - 2 Score 0 6 0  Altered sleeping 0 1 -  Tired, decreased energy 0 2 -  Change in appetite 0 3 -  Feeling bad or failure about yourself  0 - -  Trouble concentrating 0 3 -  Moving slowly or fidgety/restless 0 - -  Suicidal thoughts 0 0 -  PHQ-9 Score 0 15 -  Difficult doing work/chores Not difficult at all - -   GAD 7 : Generalized Anxiety Score 02/22/2017 01/17/2017  Nervous, Anxious, on Edge 1 3  Control/stop worrying 0 2  Worry too much - different things 0 1  Trouble relaxing 1 1    Restless 0 0  Easily annoyed or irritable 0 1  Afraid - awful might happen 0 2  Total GAD 7 Score 2 10  Anxiety Difficulty - Not difficult at all    Assessment/ Plan: 67 y.o. female   Depression PHQ 9 score greatly improved from a 15 to a 0.  Gad 7 score down from a 10 to a 2.  Patient subjectively  is feeling much better.  While I still think that counseling would benefit patient, I do understand her financial limitations.  We will continue to follow patient.  I asked that she follow-up in the next 3-6 months for recheck or sooner if needed.  Refills for Prozac have been sent to the pharmacy.  Additionally, I did remind her that Prozac and SSRIs as a general class can cause heart rhythm abnormalities.  Should she develop any unusual signs or symptoms, I recommended that she seek immediate medical attention, particularly with her history of bradycardia and known heart disease.  She was good understanding and will follow-up if needed.   Janora Norlander, DO [place] [phone]

## 2017-02-22 ENCOUNTER — Encounter: Payer: Self-pay | Admitting: Family Medicine

## 2017-02-22 ENCOUNTER — Ambulatory Visit (INDEPENDENT_AMBULATORY_CARE_PROVIDER_SITE_OTHER): Payer: Medicare Other | Admitting: Family Medicine

## 2017-02-22 VITALS — BP 139/69 | HR 56 | Temp 97.6°F | Ht 63.0 in | Wt 165.0 lb

## 2017-02-22 DIAGNOSIS — F329 Major depressive disorder, single episode, unspecified: Secondary | ICD-10-CM | POA: Diagnosis not present

## 2017-02-22 MED ORDER — FLUOXETINE HCL 10 MG PO CAPS: 10.0000 mg | ORAL_CAPSULE | Freq: Every day | ORAL | 1 refills | Status: DC

## 2017-02-22 NOTE — Assessment & Plan Note (Signed)
PHQ 9 score greatly improved from a 15 to a 0.  Gad 7 score down from a 10 to a 2.  Patient subjectively is feeling much better.  While I still think that counseling would benefit patient, I do understand her financial limitations.  We will continue to follow patient.  I asked that she follow-up in the next 3-6 months for recheck or sooner if needed.  Refills for Prozac have been sent to the pharmacy.  Additionally, I did remind her that Prozac and SSRIs as a general class can cause heart rhythm abnormalities.  Should she develop any unusual signs or symptoms, I recommended that she seek immediate medical attention, particularly with her history of bradycardia and known heart disease.  She was good understanding and will follow-up if needed.

## 2017-02-22 NOTE — Patient Instructions (Signed)
I'm so glad to hear that you are doing better!  I have refilled your medication.

## 2017-02-27 DIAGNOSIS — D2239 Melanocytic nevi of other parts of face: Secondary | ICD-10-CM | POA: Diagnosis not present

## 2017-02-27 DIAGNOSIS — L821 Other seborrheic keratosis: Secondary | ICD-10-CM | POA: Diagnosis not present

## 2017-02-27 DIAGNOSIS — L570 Actinic keratosis: Secondary | ICD-10-CM | POA: Diagnosis not present

## 2017-02-27 DIAGNOSIS — L853 Xerosis cutis: Secondary | ICD-10-CM | POA: Diagnosis not present

## 2017-02-27 DIAGNOSIS — L814 Other melanin hyperpigmentation: Secondary | ICD-10-CM | POA: Diagnosis not present

## 2017-03-01 ENCOUNTER — Encounter: Payer: Self-pay | Admitting: Family Medicine

## 2017-03-01 ENCOUNTER — Ambulatory Visit (INDEPENDENT_AMBULATORY_CARE_PROVIDER_SITE_OTHER): Payer: Medicare Other

## 2017-03-01 ENCOUNTER — Ambulatory Visit (INDEPENDENT_AMBULATORY_CARE_PROVIDER_SITE_OTHER): Payer: Medicare Other | Admitting: Family Medicine

## 2017-03-01 VITALS — BP 155/67 | HR 52 | Temp 97.9°F | Ht 63.0 in | Wt 167.0 lb

## 2017-03-01 DIAGNOSIS — R0789 Other chest pain: Secondary | ICD-10-CM | POA: Diagnosis not present

## 2017-03-01 DIAGNOSIS — R072 Precordial pain: Secondary | ICD-10-CM | POA: Diagnosis not present

## 2017-03-01 DIAGNOSIS — R079 Chest pain, unspecified: Secondary | ICD-10-CM | POA: Diagnosis not present

## 2017-03-01 NOTE — Progress Notes (Signed)
Subjective: CC: chest pain PCP: Janora Norlander, DO HPI:[name] [name] is a 67 y.o. female presenting to clinic today for:  1. Chest pain Patient reports intermittent substernal chest pain that has been occurring over the last several weeks.  Initially, chest pain radiated to the left axilla but it now radiates to the right upper extremity.  She describes the pain as sharp.  It was previously relieved by Protonix.  It is not exacerbated or relieved by rest or activity.  She reports associated dry heaves this morning.  Denies vomiting.  Chest pain is not pleuritic.  No associated shortness of breath.  No diaphoresis.  No hemoptysis.  She notes that she tried to take a Protonix to see if this would relieve and it did not.  Past medical history is significant for coronary artery disease status post CABG x2.  She reports she tried to get an appointment with her cardiologist but unfortunately was not able to get into their office until the end of the month.  She did not want to go to the emergency department if possible as she is not entirely sure that this is cardiac in nature.  She notes that this does not feel like her heart attacks in the past.   ROS: Per HPI  Allergies  Allergen Reactions  . Amlodipine Swelling  . Cetirizine & Related Other (See Comments)    Extreme Drowsiness - "knocks me loopy for 2 days"  . Latex Rash  . Levofloxacin Nausea And Vomiting  . Lisinopril Other (See Comments)    Excessive mucus production  . Prednisone Other (See Comments)    High dose - hallucinations   Past Medical History:  Diagnosis Date  . Anxiety   . Complication of anesthesia    difficult waking up "  . Depression   . GERD (gastroesophageal reflux disease)   . Heart murmur    as child  . Hemorrhoids    will occ bleed   . Hypertension   . IBS (irritable bowel syndrome)   . Renal cyst, left    cyst left kidney    Current Outpatient Medications:  .  aspirin 81 MG tablet, Take 81 mg by  mouth daily., Disp: , Rfl:  .  Aspirin-Acetaminophen-Caffeine (EXCEDRIN PO), Take by mouth., Disp: , Rfl:  .  atorvastatin (LIPITOR) 20 MG tablet, Take 1 tablet (20 mg total) by mouth daily., Disp: 90 tablet, Rfl: 1 .  FLUoxetine (PROZAC) 10 MG capsule, Take 1 capsule (10 mg total) by mouth daily., Disp: 90 capsule, Rfl: 1 .  naproxen (NAPROSYN) 500 MG tablet, Take 1 tablet (500 mg total) 2 (two) times daily as needed by mouth., Disp: 30 tablet, Rfl: 0 .  pantoprazole (PROTONIX) 40 MG tablet, Take 1 tablet (40 mg total) by mouth daily., Disp: 90 tablet, Rfl: 0 .  spironolactone (ALDACTONE) 25 MG tablet, Take 0.5 tablets (12.5 mg total) by mouth daily., Disp: 45 tablet, Rfl: 1 Social History   Socioeconomic History  . Marital status: Divorced    Spouse name: Not on file  . Number of children: Not on file  . Years of education: Not on file  . Highest education level: Not on file  Social Needs  . Financial resource strain: Not on file  . Food insecurity - worry: Not on file  . Food insecurity - inability: Not on file  . Transportation needs - medical: Not on file  . Transportation needs - non-medical: Not on file  Occupational History  .  Not on file  Tobacco Use  . Smoking status: Former Smoker    Last attempt to quit: 05/23/2006    Years since quitting: 10.7  . Smokeless tobacco: Never Used  Substance and Sexual Activity  . Alcohol use: Yes    Alcohol/week: 0.0 oz    Comment: rare glass of wine  . Drug use: No  . Sexual activity: Not Currently    Birth control/protection: Post-menopausal  Other Topics Concern  . Not on file  Social History Narrative  . Not on file   Family History  Problem Relation Age of Onset  . Stroke Mother   . Diabetes Mother        history of diabetes  . Heart failure Mother   . Cancer Father 50       bone cancer  . Drug abuse Sister   . Cancer Brother        throat  . Alcohol abuse Sister   . Colon cancer Neg Hx     Objective: Office vital  signs reviewed. BP (!) 155/67   Pulse (!) 52   Temp 97.9 F (36.6 C) (Oral)   Ht 5\' 3"  (1.6 m)   Wt 167 lb (75.8 kg)   BMI 29.58 kg/m   Physical Examination:  General: Awake, alert, well nourished, No acute distress HEENT: no JVD, MMM Cardio: Bradycardic with regular rhythm, S1S2 heard, no murmurs appreciated Pulm: clear to auscultation bilaterally, no wheezes, rhonchi or rales; normal work of breathing on room air GI: soft, non-tender, non-distended, bowel sounds present x4, no hepatomegaly, no splenomegaly, no masses Psych: Mood stable, speech normal, good eye contact, affect appropriate, appears somewhat anxious. Skin: vertical, midline chest scar that is well healed   Dg Chest 2 View  Result Date: 03/01/2017 CLINICAL DATA:  Patient with sub sternal chest pain radiating to the right upper extremity. EXAM: CHEST  2 VIEW COMPARISON:  Chest radiograph 08/31/2014 FINDINGS: Stable cardiac and mediastinal contours status post median sternotomy. No consolidative pulmonary opacities. No pleural effusion or pneumothorax. Thoracic spine degenerative changes. IMPRESSION: No acute cardiopulmonary process. Electronically Signed   By: Lovey Newcomer M.D.   On: 03/01/2017 09:20    Assessment/ Plan: 67 y.o. female   1. Substernal chest pain Blood pressure is slightly elevated.  Heart rate is bradycardic, but this is typical for patient.  EKG obtained.  This was unchanged compared to previous performed in May 2018 by her cardiologist.  Her chest x-ray was obtained.  Personal review of CXR: An irregularity in the left lower lung field appreciated.  This may be postsurgical changes.  It appears unchanged from previous.  Still awaiting formal read by radiologist.  Her symptoms are actually improving during the visit.  Patient's heart score today is 5.  I did discuss with her that she is at increased risk, especially given history of MI and CABG in the past.  I did recommend that she go to the emergency  department for ACS rule out.  She declined this.  She has capacity and understands and accepts the risk of adverse event.  She will go "on Saturday" if symptoms have not resolved.  Again, I reiterated that she should strongly consider going to the emergency department now. - DG Chest 2 View; Future - EKG 12-Lead  2. Atypical chest pain   Orders Placed This Encounter  Procedures  . DG Chest 2 View    Standing Status:   Future    Number of Occurrences:   1  Standing Expiration Date:   04/30/2018    Order Specific Question:   Reason for Exam (SYMPTOM  OR DIAGNOSIS REQUIRED)    Answer:   substernal chest pain radiating to right upper extremity; w/ history of MI in past    Order Specific Question:   Preferred imaging location?    Answer:   Internal    Order Specific Question:   Radiology Contrast Protocol - do NOT remove file path    Answer:   [email or web address]  . EKG 12-Lead     Janora Norlander, [facility] [phone]

## 2017-03-05 ENCOUNTER — Telehealth: Payer: Self-pay | Admitting: Family Medicine

## 2017-03-06 NOTE — Telephone Encounter (Signed)
[facility] to x-ray 

## 2017-03-06 NOTE — Telephone Encounter (Signed)
Spoke with pt and got her an appointment to see cardiologist. This is chest pain, that we recently saw her about, but she thought maybe she should rule out any type of breast abnormality since she couldn't get an appointment with cardiologist before April 2019. For now, pt agreed to cancel this request

## 2017-03-07 ENCOUNTER — Encounter: Payer: Self-pay | Admitting: Cardiology

## 2017-03-07 ENCOUNTER — Ambulatory Visit (INDEPENDENT_AMBULATORY_CARE_PROVIDER_SITE_OTHER): Payer: Medicare Other | Admitting: Cardiology

## 2017-03-07 ENCOUNTER — Telehealth: Payer: Self-pay | Admitting: Physician Assistant

## 2017-03-07 ENCOUNTER — Ambulatory Visit: Payer: Medicare HMO | Admitting: Physician Assistant

## 2017-03-07 VITALS — BP 130/68 | HR 59 | Ht 63.0 in | Wt 166.4 lb

## 2017-03-07 DIAGNOSIS — R001 Bradycardia, unspecified: Secondary | ICD-10-CM | POA: Diagnosis not present

## 2017-03-07 DIAGNOSIS — R079 Chest pain, unspecified: Secondary | ICD-10-CM

## 2017-03-07 DIAGNOSIS — R0789 Other chest pain: Secondary | ICD-10-CM | POA: Diagnosis not present

## 2017-03-07 DIAGNOSIS — E785 Hyperlipidemia, unspecified: Secondary | ICD-10-CM

## 2017-03-07 DIAGNOSIS — Z951 Presence of aortocoronary bypass graft: Secondary | ICD-10-CM

## 2017-03-07 MED ORDER — NITROGLYCERIN 0.4 MG SL SUBL: 0.4000 mg | SUBLINGUAL_TABLET | SUBLINGUAL | 3 refills | Status: DC | PRN

## 2017-03-07 NOTE — Assessment & Plan Note (Signed)
Not on beta blocker 

## 2017-03-07 NOTE — Telephone Encounter (Signed)
Called patient cell phone and LVM to call back to reschedule her stress test to this week if available or early next week.

## 2017-03-07 NOTE — Assessment & Plan Note (Signed)
LDL 68 Aug 2018

## 2017-03-07 NOTE — Patient Instructions (Addendum)
Medication Instructions:  Your physician recommends that you continue on your current medications as directed. Please refer to the Current Medication list given to you today.  Labwork: NONE   Testing/Procedures: Your physician has requested that you have en exercise stress myoview. For further information please visit [email or web address]. Please follow instruction sheet, as given. TREADMILL MYOVIEW-STAT  Follow-Up: Your physician wants you to follow-up in: [place].  You will receive a reminder letter in the mail two months in advance. If you don't receive a letter, please call our office to schedule the follow-up appointment.  Any Other Special Instructions Will Be Listed Below (If Applicable).     If you need a refill on your cardiac medications before your next appointment, please call your pharmacy.

## 2017-03-07 NOTE — Progress Notes (Signed)
03/07/2017 [name] [name]   1950/11/09  [id number]  Primary Physician Janora Norlander, DO Primary Cardiologist: Dr Claiborne Billings  HPI:  Pleasant 67 y/o female followed by Dr Claiborne Billings with a history of CAD, s/p CABG x 2 in April 2013.  The pt says she didn't have chest pain then just labile B/P and then she had an abnormal Myoview that led to cath. At cath she had 60-70% LM and 60% OM. She had CABG x 2 with an LIMA to LAD and SVG to OM. She had a low risk Myoview in 2014 and a normal echo in 2016. Her LOV with Dr Claiborne Billings was in Oct 2018.   She is in the office today for evaluation of chest pain. The pt says she has had epigastric, mid sternal chest discomfort for the past two weeks. It initially woke her up. She denies any associated nausea, vomiting, or dyspnea. It does not radiate to her arms or jaw. Its not exertional. She takes Excedrin with relief. She saw her PCP who suggested she f/u here.    Current Outpatient Medications  Medication Sig Dispense Refill  . aspirin 81 MG tablet Take 81 mg by mouth daily.    . Aspirin-Acetaminophen-Caffeine (EXCEDRIN PO) Take by mouth.    Marland Kitchen atorvastatin (LIPITOR) 20 MG tablet Take 1 tablet (20 mg total) by mouth daily. 90 tablet 1  . FLUoxetine (PROZAC) 10 MG capsule Take 1 capsule (10 mg total) by mouth daily. 90 capsule 1  . pantoprazole (PROTONIX) 40 MG tablet Take 1 tablet (40 mg total) by mouth daily. 90 tablet 0  . spironolactone (ALDACTONE) 25 MG tablet Take 0.5 tablets (12.5 mg total) by mouth daily. 45 tablet 1  . nitroGLYCERIN (NITROSTAT) 0.4 MG SL tablet Place 1 tablet (0.4 mg total) under the tongue every 5 (five) minutes as needed for chest pain. 25 tablet 3   No current facility-administered medications for this visit.     Allergies  Allergen Reactions  . Amlodipine Swelling  . Cetirizine & Related Other (See Comments)    Extreme Drowsiness - "knocks me loopy for 2 days"  . Latex Rash  . Levofloxacin Nausea And Vomiting  . Lisinopril Other  (See Comments)    Excessive mucus production  . Prednisone Other (See Comments)    High dose - hallucinations    Past Medical History:  Diagnosis Date  . Anxiety   . Complication of anesthesia    difficult waking up "  . Depression   . GERD (gastroesophageal reflux disease)   . Heart murmur    as child  . Hemorrhoids    will occ bleed   . Hypertension   . IBS (irritable bowel syndrome)   . Renal cyst, left    cyst left kidney    Social History   Socioeconomic History  . Marital status: Divorced    Spouse name: Not on file  . Number of children: Not on file  . Years of education: Not on file  . Highest education level: Not on file  Social Needs  . Financial resource strain: Not on file  . Food insecurity - worry: Not on file  . Food insecurity - inability: Not on file  . Transportation needs - medical: Not on file  . Transportation needs - non-medical: Not on file  Occupational History  . Not on file  Tobacco Use  . Smoking status: Former Smoker    Last attempt to quit: 05/23/2006    Years since  quitting: 10.7  . Smokeless tobacco: Never Used  Substance and Sexual Activity  . Alcohol use: Yes    Alcohol/week: 0.0 oz    Comment: rare glass of wine  . Drug use: No  . Sexual activity: Not Currently    Birth control/protection: Post-menopausal  Other Topics Concern  . Not on file  Social History Narrative  . Not on file     Family History  Problem Relation Age of Onset  . Stroke Mother   . Diabetes Mother        history of diabetes  . Heart failure Mother   . Cancer Father 15       bone cancer  . Drug abuse Sister   . Cancer Brother        throat  . Alcohol abuse Sister   . Colon cancer Neg Hx      Review of Systems: General: negative for chills, fever, night sweats or weight changes.  Cardiovascular: negative for chest pain, dyspnea on exertion, edema, orthopnea, palpitations, paroxysmal nocturnal dyspnea or shortness of breath Dermatological:  negative for rash Respiratory: negative for cough or wheezing Urologic: negative for hematuria Abdominal: negative for nausea, vomiting, diarrhea, bright red blood per rectum, melena, or hematemesis Neurologic: negative for visual changes, syncope, or dizziness All other systems reviewed and are otherwise negative except as noted above.    Blood pressure 130/68, pulse (!) 59, height 5\' 3"  (1.6 m), weight 166 lb 6.4 oz (75.5 kg).  General appearance: alert, cooperative and no distress Neck: no carotid bruit and no JVD Lungs: clear to auscultation bilaterally Heart: regular rate and rhythm Abdomen: soft, non-tender; bowel sounds normal; no masses,  no organomegaly and no RUQ tenderness Extremities: extremities normal, atraumatic, no cyanosis or edema Pulses: 2+ and symmetric Skin: Skin color, texture, turgor normal. No rashes or lesions Neurologic: Grossly normal  EKG NSR, HR 59, TWI V2 (old)  ASSESSMENT AND PLAN:   Chest pain with moderate risk of acute coronary syndrome Plan GXT Myoview  S/P CABG x 2 LIMA-LAD, SVG-OM Aprill 2013 Myoview low risk 2014 Echo normal LVF 2016  Hyperlipidemia LDL goal <70 LDL 68 Aug 2018  Bradycardia Not on beta blocker   PLAN  Plan GXT Myoview. Rx for SL NTG provided  [name] PA-C 03/07/2017 2:41 PM

## 2017-03-07 NOTE — Assessment & Plan Note (Signed)
LIMA-LAD, SVG-OM Aprill 2013 Myoview low risk 2014 Echo normal LVF 2016

## 2017-03-07 NOTE — Assessment & Plan Note (Signed)
Plan GXT Myoview

## 2017-03-08 ENCOUNTER — Other Ambulatory Visit: Payer: Self-pay | Admitting: *Deleted

## 2017-03-08 DIAGNOSIS — R079 Chest pain, unspecified: Secondary | ICD-10-CM

## 2017-03-08 DIAGNOSIS — Z951 Presence of aortocoronary bypass graft: Secondary | ICD-10-CM

## 2017-03-12 ENCOUNTER — Other Ambulatory Visit: Payer: Self-pay

## 2017-03-12 ENCOUNTER — Telehealth: Payer: Self-pay | Admitting: Cardiovascular Disease

## 2017-03-12 DIAGNOSIS — R079 Chest pain, unspecified: Secondary | ICD-10-CM

## 2017-03-12 NOTE — Telephone Encounter (Signed)
New message     1. What dental office are you calling from? Dr  Ephraim Hamburger  2. What is your office phone and fax number? [phone] AttnMardene Celeste, phone [phone]  3. What type of procedure is the patient having performed? Filing, extraction  4. What date is procedure scheduled or is the patient there now? 03/26/17  5. What is your question (ex. Antibiotics prior to procedure, holding medication-we need to know how long dentist wants pt to hold med)? Are there medications patient needs to hold, including aspirin

## 2017-03-13 ENCOUNTER — Encounter: Payer: Self-pay | Admitting: *Deleted

## 2017-03-13 ENCOUNTER — Encounter (HOSPITAL_COMMUNITY): Payer: Medicare Other

## 2017-03-13 NOTE — Telephone Encounter (Signed)
   Primary Cardiologist: Dr. Claiborne Billings  Chart reviewed as part of pre-operative protocol coverage.   [name] [name] was last seen on 03/07/17  by [name] for chest pain. Pending Myoview prior to clearance.   Pre-op covering staff: - Please contact requesting surgeon's office via preferred method (i.e, phone, fax) and the patient to inform them Pending study prior to clearance and advice regarding ASA holding.   [place], [place] 03/13/2017, 12:08 PM

## 2017-03-13 NOTE — Progress Notes (Signed)
This encounter was created in error - please disregard.

## 2017-03-13 NOTE — Telephone Encounter (Signed)
Caryn Section at Dr. Chuck Hint office. Informed her that patient has stress test pending prior to clearance. Informed her that patient will have stress test on Friday. Mardene Celeste request to hear back about clearance after stress test is complete.

## 2017-03-14 ENCOUNTER — Encounter (HOSPITAL_COMMUNITY): Payer: Medicare Other

## 2017-03-14 ENCOUNTER — Ambulatory Visit: Payer: Medicare HMO | Admitting: Cardiology

## 2017-03-16 ENCOUNTER — Encounter (HOSPITAL_COMMUNITY): Payer: Self-pay

## 2017-03-16 ENCOUNTER — Encounter (HOSPITAL_COMMUNITY)
Admission: RE | Admit: 2017-03-16 | Discharge: 2017-03-16 | Disposition: A | Discharge status: Home / Self Care | Payer: Medicare Other | Source: Ambulatory Visit | Attending: Cardiology | Admitting: Cardiology

## 2017-03-16 ENCOUNTER — Encounter (HOSPITAL_BASED_OUTPATIENT_CLINIC_OR_DEPARTMENT_OTHER)
Admission: RE | Admit: 2017-03-16 | Discharge: 2017-03-16 | Disposition: A | Discharge status: Home / Self Care | Payer: Medicare Other | Source: Ambulatory Visit | Attending: Cardiology | Admitting: Cardiology

## 2017-03-16 DIAGNOSIS — R079 Chest pain, unspecified: Secondary | ICD-10-CM

## 2017-03-16 DIAGNOSIS — Z951 Presence of aortocoronary bypass graft: Secondary | ICD-10-CM | POA: Insufficient documentation

## 2017-03-16 LAB — NM MYOCAR MULTI W/SPECT W/WALL MOTION / EF
Estimated workload: 8.2 METS
Exercise duration (min): 7 min
Exercise duration (sec): 36 s
LV dias vol: 74 mL (ref 46–106)
LV sys vol: 22 mL
MPHR: 155 {beats}/min
Peak HR: 134 {beats}/min
Percent HR: 86 %
RATE: 0.38
Rest HR: 57 {beats}/min
SDS: 4
SRS: 0
SSS: 4
TID: 0.98

## 2017-03-16 MED ORDER — TECHNETIUM TC 99M TETROFOSMIN IV KIT
30.0000 | PACK | Freq: Once | INTRAVENOUS | Status: AC | PRN
  Administered 2017-03-16: 32 via INTRAVENOUS

## 2017-03-16 MED ORDER — SODIUM CHLORIDE 0.9% FLUSH
INTRAVENOUS | Status: AC
  Administered 2017-03-16: 10 mL via INTRAVENOUS
  Filled 2017-03-16: qty 10

## 2017-03-16 MED ORDER — REGADENOSON 0.4 MG/5ML IV SOLN
INTRAVENOUS | Status: AC
  Filled 2017-03-16: qty 5

## 2017-03-16 MED ORDER — TECHNETIUM TC 99M TETROFOSMIN IV KIT
10.0000 | PACK | Freq: Once | INTRAVENOUS | Status: AC | PRN
  Administered 2017-03-16: 10.8 via INTRAVENOUS

## 2017-03-19 NOTE — Telephone Encounter (Signed)
   Chart reviewed as part of pre-operative protocol coverage. Clearance request was for filling/extractions. This also raised question of whether to hold aspirin. Per American Dental Association guidelines there is typically no need to hold aspirin before minor dental procedure such as extraction. Will route to [facility] for final input on clearance given resulted nuclear study (do not see result note yet filed under this). Luke, please route response to P CV DIV PREOP and I will get this to the dental office.  Charlie Pitter, PA-C 03/19/2017, 8:37 AM

## 2017-03-21 NOTE — Telephone Encounter (Signed)
Recent myoview was low risk, I have discussed with [name], recent Brantley Fling was low risk, Lurena Joiner has already cleared the patient in a separate result note. This decision was informed to the patient as well. Please double check with Dr. Chuck Hint office to make sure they have also received our clearance.   Hilbert Corrigan PA Pager: [phone]

## 2017-03-21 NOTE — Telephone Encounter (Signed)
Pre op clearance received per office staff

## 2017-03-22 ENCOUNTER — Telehealth: Payer: Self-pay | Admitting: Cardiology

## 2017-03-22 NOTE — Telephone Encounter (Signed)
Pt would like to know if her Stress Test results are ready from 03-16-17?

## 2017-03-22 NOTE — Telephone Encounter (Signed)
Returned call to patient stress test results given.

## 2017-04-02 ENCOUNTER — Emergency Department (HOSPITAL_COMMUNITY)
Admission: EM | Admit: 2017-04-02 | Discharge: 2017-04-02 | Disposition: A | Discharge status: Home / Self Care | Payer: Medicare Other | Attending: Emergency Medicine | Admitting: Emergency Medicine

## 2017-04-02 ENCOUNTER — Other Ambulatory Visit: Payer: Self-pay

## 2017-04-02 ENCOUNTER — Encounter (HOSPITAL_COMMUNITY): Payer: Self-pay

## 2017-04-02 DIAGNOSIS — R0789 Other chest pain: Secondary | ICD-10-CM | POA: Diagnosis not present

## 2017-04-02 DIAGNOSIS — Z7982 Long term (current) use of aspirin: Secondary | ICD-10-CM | POA: Insufficient documentation

## 2017-04-02 DIAGNOSIS — I10 Essential (primary) hypertension: Secondary | ICD-10-CM | POA: Diagnosis not present

## 2017-04-02 DIAGNOSIS — Z91040 Latex allergy status: Secondary | ICD-10-CM | POA: Insufficient documentation

## 2017-04-02 DIAGNOSIS — Z951 Presence of aortocoronary bypass graft: Secondary | ICD-10-CM | POA: Diagnosis not present

## 2017-04-02 DIAGNOSIS — Z79899 Other long term (current) drug therapy: Secondary | ICD-10-CM | POA: Diagnosis not present

## 2017-04-02 DIAGNOSIS — Z87891 Personal history of nicotine dependence: Secondary | ICD-10-CM | POA: Insufficient documentation

## 2017-04-02 LAB — COMPREHENSIVE METABOLIC PANEL
ALT: 17 U/L (ref 14–54)
AST: 16 U/L (ref 15–41)
Albumin: 3.6 g/dL (ref 3.5–5.0)
Alkaline Phosphatase: 76 U/L (ref 38–126)
Anion gap: 10 (ref 5–15)
BUN: 24 mg/dL — ABNORMAL HIGH (ref 6–20)
CO2: 24 mmol/L (ref 22–32)
Calcium: 8.9 mg/dL (ref 8.9–10.3)
Chloride: 105 mmol/L (ref 101–111)
Creatinine, Ser: 0.79 mg/dL (ref 0.44–1.00)
GFR calc Af Amer: >60 mL/min (ref >60)
GFR calc non Af Amer: >60 mL/min (ref >60)
Glucose, Bld: 107 mg/dL — ABNORMAL HIGH (ref 65–99)
Potassium: 3.9 mmol/L (ref 3.5–5.1)
Sodium: 139 mmol/L (ref 135–145)
Total Bilirubin: 0.3 mg/dL (ref 0.3–1.2)
Total Protein: 6.5 g/dL (ref 6.5–8.1)

## 2017-04-02 LAB — CBC WITH DIFFERENTIAL/PLATELET
Basophils Absolute: 0 10*3/uL (ref 0.0–0.1)
Basophils Relative: 0 %
Eosinophils Absolute: 0.2 10*3/uL (ref 0.0–0.7)
Eosinophils Relative: 2 %
HCT: 41.5 % (ref 36.0–46.0)
Hemoglobin: 13.4 g/dL (ref 12.0–15.0)
Lymphocytes Relative: 31 %
Lymphs Abs: 2.8 10*3/uL (ref 0.7–4.0)
MCH: 27.6 pg (ref 26.0–34.0)
MCHC: 32.3 g/dL (ref 30.0–36.0)
MCV: 85.6 fL (ref 78.0–100.0)
Monocytes Absolute: 0.8 10*3/uL (ref 0.1–1.0)
Monocytes Relative: 9 %
Neutro Abs: 5.2 10*3/uL (ref 1.7–7.7)
Neutrophils Relative %: 58 %
Platelets: 376 10*3/uL (ref 150–400)
RBC: 4.85 MIL/uL (ref 3.87–5.11)
RDW: 13.2 % (ref 11.5–15.5)
WBC: 9.1 10*3/uL (ref 4.0–10.5)

## 2017-04-02 LAB — TROPONIN I
Troponin I: <0.03 ng/mL (ref <0.03)
Troponin I: <0.03 ng/mL (ref <0.03)

## 2017-04-02 LAB — LIPASE, BLOOD: Lipase: 29 U/L (ref 11–51)

## 2017-04-02 MED ORDER — FAMOTIDINE 20 MG PO TABS
20.0000 mg | ORAL_TABLET | Freq: Once | ORAL | Status: AC
  Administered 2017-04-02: 20 mg via ORAL
  Filled 2017-04-02: qty 1

## 2017-04-02 MED ORDER — GI COCKTAIL ~~LOC~~
30.0000 mL | Freq: Once | ORAL | Status: AC
  Administered 2017-04-02: 30 mL via ORAL
  Filled 2017-04-02: qty 30

## 2017-04-02 NOTE — ED Provider Notes (Signed)
[facility] EMERGENCY DEPARTMENT Provider Note   CSN: [id number] Arrival date & time: 04/02/17  0110  Time seen 02:00 AM   History   Chief Complaint Chief Complaint  Patient presents with  . Chest Pain    HPI [name] [name] is a 67 y.o. female.  HPI the patient states she has been having chest pain off and on for the past month.  She states she gets it once to twice a week.  She does not related to anything she does.  She states she has been evaluated by her family practice doctor and they did a chest x-ray and EKG that she states were no change from her usual.  She also was seen by her cardiology office and had a another EKG done.  They scheduled her for a stress test on February 1 which she states was normal.  They did not give her any further instructions after she had the stress test.  She states this evening about 6 PM she had pain in the center of her chest that radiated around into her right axilla.  It lasted about 30 minutes.  She states again at midnight she was awakened from sleep with the worst pain to that she has had that again was in the right side of her chest and right axilla.  She described the pain as sharp.  She denies nausea, vomiting, diaphoresis, or shortness of breath.  She states she took an Excedrin and then EMS advised her to take 4 baby aspirin which she did about 1215.  She states this pain lasted just under 30 minutes.  She states she currently feels fine.  She states she rarely gets abdominal bloating and has a discomfort in her right upper lateral abdomen.  She also states she gets lots of reflux and she has been taking Protonix on a regular basis recently.  She states she even sleeps on the couch so her head is elevated.  She states she has been following a low-fat diet and had baked chicken and rice tonight for dinner.  Patient states she is status post 2 vessel bypass surgery in 2013.  PCP Janora Norlander, DO Cardiology Dr Claiborne Billings  Past Medical History:    Diagnosis Date  . Anxiety   . Complication of anesthesia    difficult waking up "  . Depression   . GERD (gastroesophageal reflux disease)   . Heart murmur    as child  . Hemorrhoids    will occ bleed   . Hypertension   . IBS (irritable bowel syndrome)   . Renal cyst, left    cyst left kidney    Patient Active Problem List   Diagnosis Date Noted  . Atrophic vaginitis 08/26/2015  . BMI 29.0-29.9,adult 04/08/2015  . Essential hypertension 11/21/2014  . Hyperlipidemia LDL goal <70 11/21/2014  . Bradycardia 10/10/2013  . Chest pain with moderate risk of acute coronary syndrome 11/20/2012  . S/P CABG x 2 06/19/2011  . Unstable angina ([place]) 05/23/2011  . Anxiety 05/23/2011  . Depression 05/23/2011  . Renal cyst 05/23/2011  . History of smoking, quit 2008 05/23/2011    Past Surgical History:  Procedure Laterality Date  . ABDOMINAL HYSTERECTOMY  2003  . CARDIAC CATHETERIZATION  05/22/2011  . CORONARY ARTERY BYPASS GRAFT  05/26/2011   Procedure: CORONARY ARTERY BYPASS GRAFTING (CABG);  Surgeon: Ivin Poot, MD;  Location: [facility];  Service: Open Heart Surgery;  Laterality: N/A;  Coronary Artery Bypass Graft on Pump  times two utlizing left internal mammary artery and right greater saphenous vein harvested endoscopically   . CORONARY ARTERY BYPASS GRAFT     done by Dr. Kerby Less  . LEFT HEART CATHETERIZATION WITH CORONARY ANGIOGRAM N/A 05/24/2011   Procedure: LEFT HEART CATHETERIZATION WITH CORONARY ANGIOGRAM;  Surgeon: Leonie Man, MD;  Location: [facility] CATH LAB;  Service: Cardiovascular;  Laterality: N/A;  . TONSILLECTOMY  1962  . TUBAL LIGATION      OB History    No data available       Home Medications    Prior to Admission medications   Medication Sig Start Date End Date Taking? Authorizing Provider  aspirin 81 MG tablet Take 81 mg by mouth daily.    [provider]  Aspirin-Acetaminophen-Caffeine (EXCEDRIN PO) Take by mouth.    [provider]   atorvastatin (LIPITOR) 20 MG tablet Take 1 tablet (20 mg total) by mouth daily. 01/09/17   Troy Sine, MD  FLUoxetine (PROZAC) 10 MG capsule Take 1 capsule (10 mg total) by mouth daily. 02/22/17   Janora Norlander, DO  nitroGLYCERIN (NITROSTAT) 0.4 MG SL tablet Place 1 tablet (0.4 mg total) under the tongue every 5 (five) minutes as needed for chest pain. 03/07/17 06/05/17  Erlene Quan, PA-C  pantoprazole (PROTONIX) 40 MG tablet Take 1 tablet (40 mg total) by mouth daily. 01/31/17   Janora Norlander, DO  spironolactone (ALDACTONE) 25 MG tablet Take 0.5 tablets (12.5 mg total) by mouth daily. 01/09/17 04/09/17  Troy Sine, MD    Family History Family History  Problem Relation Age of Onset  . Stroke Mother   . Diabetes Mother        history of diabetes  . Heart failure Mother   . Cancer Father 71       bone cancer  . Drug abuse Sister   . Cancer Brother        throat  . Alcohol abuse Sister   . Colon cancer Neg Hx     Social History Social History   Tobacco Use  . Smoking status: Former Smoker    Last attempt to quit: 05/23/2006    Years since quitting: 10.8  . Smokeless tobacco: Never Used  Substance Use Topics  . Alcohol use: Yes    Alcohol/week: 0.0 oz    Comment: rare glass of wine  . Drug use: No  retired   Allergies   Amlodipine; Cetirizine & related; Latex; Levofloxacin; Lisinopril; Prednisone; and Tape   Review of Systems Review of Systems  All other systems reviewed and are negative.    Physical Exam Updated Vital Signs BP (!) 157/74   Pulse (!) 58   Temp 98.4 F (36.9 C) (Oral)   Resp 14   Ht 5\' 3"  (1.6 m)   Wt 75.3 kg (166 lb)   SpO2 97%   BMI 29.41 kg/m   Vital signs normal except borderline bradycardia   Physical Exam  Constitutional: She is oriented to person, place, and time. She appears well-developed and well-nourished.  Non-toxic appearance. She does not appear ill. No distress.  HENT:  Head: Normocephalic and  atraumatic.  Right Ear: External ear normal.  Left Ear: External ear normal.  Nose: Nose normal. No mucosal edema or rhinorrhea.  Mouth/Throat: Oropharynx is clear and moist and mucous membranes are normal. No dental abscesses or uvula swelling.  Eyes: Conjunctivae and EOM are normal. Pupils are equal, round, and reactive to light.  Neck: Normal range of  motion and full passive range of motion without pain. Neck supple.  Cardiovascular: Normal rate, regular rhythm and normal heart sounds. Exam reveals no gallop and no friction rub.  No murmur heard. Pulmonary/Chest: Effort normal and breath sounds normal. No respiratory distress. She has no wheezes. She has no rhonchi. She has no rales. She exhibits no tenderness and no crepitus.  Area of chest pain noted    Abdominal: Soft. Normal appearance and bowel sounds are normal. She exhibits no distension. There is no tenderness. There is no rebound and no guarding.  Musculoskeletal: Normal range of motion. She exhibits no edema or tenderness.  Moves all extremities well.   Neurological: She is alert and oriented to person, place, and time. She has normal strength. No cranial nerve deficit.  Skin: Skin is warm, dry and intact. No rash noted. No erythema. No pallor.  Psychiatric: She has a normal mood and affect. Her speech is normal and behavior is normal. Her mood appears not anxious.  Nursing note and vitals reviewed.    ED Treatments / Results  Labs (all labs ordered are listed, but only abnormal results are displayed) Results for orders placed or performed during the hospital encounter of 04/02/17  Comprehensive metabolic panel  Result Value Ref Range   Sodium 139 135 - 145 mmol/L   Potassium 3.9 3.5 - 5.1 mmol/L   Chloride 105 101 - 111 mmol/L   CO2 24 22 - 32 mmol/L   Glucose, Bld 107 (H) 65 - 99 mg/dL   BUN 24 (H) 6 - 20 mg/dL   Creatinine, Ser 0.79 0.44 - 1.00 mg/dL   Calcium 8.9 8.9 - 10.3 mg/dL   Total Protein 6.5 6.5 - 8.1  g/dL   Albumin 3.6 3.5 - 5.0 g/dL   AST 16 15 - 41 U/L   ALT 17 14 - 54 U/L   Alkaline Phosphatase 76 38 - 126 U/L   Total Bilirubin 0.3 0.3 - 1.2 mg/dL   GFR calc non Af Amer >60 >60 mL/min   GFR calc Af Amer >60 >60 mL/min   Anion gap 10 5 - 15  Lipase, blood  Result Value Ref Range   Lipase 29 11 - 51 U/L  CBC with Differential  Result Value Ref Range   WBC 9.1 4.0 - 10.5 K/uL   RBC 4.85 3.87 - 5.11 MIL/uL   Hemoglobin 13.4 12.0 - 15.0 g/dL   HCT 41.5 36.0 - 46.0 %   MCV 85.6 78.0 - 100.0 fL   MCH 27.6 26.0 - 34.0 pg   MCHC 32.3 30.0 - 36.0 g/dL   RDW 13.2 11.5 - 15.5 %   Platelets 376 150 - 400 K/uL   Neutrophils Relative % 58 %   Neutro Abs 5.2 1.7 - 7.7 K/uL   Lymphocytes Relative 31 %   Lymphs Abs 2.8 0.7 - 4.0 K/uL   Monocytes Relative 9 %   Monocytes Absolute 0.8 0.1 - 1.0 K/uL   Eosinophils Relative 2 %   Eosinophils Absolute 0.2 0.0 - 0.7 K/uL   Basophils Relative 0 %   Basophils Absolute 0.0 0.0 - 0.1 K/uL  Troponin I  Result Value Ref Range   Troponin I <0.03 <0.03 ng/mL  Troponin I  Result Value Ref Range   Troponin I <0.03 <0.03 ng/mL   Laboratory interpretation all normal including delta troponin     EKG  EKG Interpretation  Date/Time:  Monday April 02 2017 01:18:31 EST Ventricular Rate:  59 PR Interval:  QRS Duration: 90 QT Interval:  416 QTC Calculation: 413 R Axis:   79 Text Interpretation:  Sinus rhythm Nonspecific T wave abnormality Anterior leads No significant change since last tracing 03 Sep 2014 Confirmed by Rolland Porter [phone]) on 04/02/2017 1:22:44 AM       #2 with episode of CP  EKG Interpretation  Date/Time:  Monday April 02 2017 04:46:06 EST Ventricular Rate:  64 PR Interval:    QRS Duration: 94 QT Interval:  424 QTC Calculation: 438 R Axis:   72 Text Interpretation:  Sinus rhythm Borderline T abnormalities, anterior leads No significant change since EARLIER SAME DATE Confirmed by Rolland Porter [phone]) on 04/02/2017  5:42:19 AM        Radiology No results found.   Nm Myocar Multi W/spect W/wall Motion / Ef  Result Date: 03/16/2017  Equivocal ST segment depression in the setting of lead artifact. No chest pain was reported. There was a hypertensive response. No arrhythmias. Low risk Duke treadmill score of 5.  Blood pressure demonstrated a hypertensive response to exercise.  No significant myocardial perfusion defects to indicate scar or ischemia.  This is a low risk study.  Nuclear stress EF: 71%.     Procedures Procedures (including critical care time)  Medications Ordered in ED Medications  famotidine (PEPCID) tablet 20 mg (not administered)  gi cocktail (Maalox,Lidocaine,Donnatal) (not administered)     Initial Impression / Assessment and Plan / ED Course  I have reviewed the triage vital signs and the nursing notes.  Pertinent labs & imaging results that were available during my care of the patient were reviewed by me and considered in my medical decision making (see chart for details).    Patient is currently pain-free.  She refused any medications at this time.  We discussed her GERD and possible gallbladder as etiology of her chest pain.  I did appropriate lab testing for that and we can consider doing an outpatient ultrasound to look for gallstones.  Recheck at 3:30 AM she remains pain-free.  We discussed getting a delta troponin at 4 AM which will be 4 hours after her pain started.  We discussed her laboratory blood test results which were normal.  She is agreeable to getting an outpatient ultrasound to look for gallstones.  If that is negative we discussed seeing gastroenterology.  She has had colonoscopy but has not had endoscopy before.  We discussed that her pain could be from a hiatal hernia, she states when she had her bypass surgery they did comment that that she did have a hiatal hernia.  Recheck at 5:30 AM she states she now having some indigestion or acid reflux.  She was  given oral Pepcid and a GI cocktail.  She states she did have a another episode of more central chest pain that lasted only a few minutes that is gone now.  I do not think this changes her ED course or disposition.  She was advised to increase her Protonix to twice a day for the next 2 weeks then once a day.  I ordered an outpatient ultrasound of the right upper quadrant to check for gallstones, if she has gallstones she should follow-up with surgery, if she does not she should follow-up with her gastroenterologist and consideration for an upper endoscopy to look for hiatal hernia, gastritis, esophagitis, or some other GI etiology for her chest pain.  She was advised to return to the ED if her chest pain gets worse.  However her recent nuclear  medicine stress test is reassuring that this is not going to be cardiac.  Final Clinical Impressions(s) / ED Diagnoses   Final diagnoses:  Atypical chest pain    ED Discharge Orders        Ordered    US Abdomen Limited RUQ/Gall Gladder     04/02/17 0544      Plan discharge  Rolland Porter, MD, Barbette Or, MD 04/02/17 [phone]

## 2017-04-02 NOTE — ED Notes (Signed)
Pt reports increased CP- EKG performed and given to Dr Tomi Bamberger- no new orders received at this time.

## 2017-04-02 NOTE — ED Triage Notes (Signed)
Pt reports intermittent CP that started about 3 weeks ago. Pt had stress test here on Feb 1 and everything checked out okay. Pain to right side of chest radiating to right axilla. Pt denies any chest pain at this time. 4 baby ASA taken prior to arrival. Pt also took an Excedrin at home.

## 2017-04-02 NOTE — Discharge Instructions (Signed)
Increase your protonix to twice a day for the next 2 weeks then back down to once a day. Call to get an appointment for the gallbladder ultrasound. If you have gallstones, you can talk to Dr Arnoldo Morale, a surgeon about having your gallbladder removed. If you don't have gallstones, consider seeing your gastroenterologist to get an upper endoscopy done to see if you have gastritis, esophagitis, hiatal hernia or something else causing your pain.   Return to the ED if you get worse.

## 2017-04-03 ENCOUNTER — Other Ambulatory Visit: Payer: Self-pay

## 2017-04-03 MED ORDER — NITROGLYCERIN 0.4 MG SL SUBL: 0.4000 mg | SUBLINGUAL_TABLET | SUBLINGUAL | 1 refills | Status: DC | PRN

## 2017-04-04 ENCOUNTER — Ambulatory Visit (INDEPENDENT_AMBULATORY_CARE_PROVIDER_SITE_OTHER): Payer: Medicare Other | Admitting: Family Medicine

## 2017-04-04 ENCOUNTER — Ambulatory Visit (HOSPITAL_COMMUNITY)
Admission: RE | Admit: 2017-04-04 | Discharge: 2017-04-04 | Disposition: A | Discharge status: Home / Self Care | Payer: Medicare Other | Source: Ambulatory Visit | Attending: Family Medicine | Admitting: Family Medicine

## 2017-04-04 ENCOUNTER — Encounter: Payer: Self-pay | Admitting: Family Medicine

## 2017-04-04 VITALS — BP 114/69 | HR 80 | Temp 97.1°F | Ht 63.0 in | Wt 167.0 lb

## 2017-04-04 DIAGNOSIS — R112 Nausea with vomiting, unspecified: Secondary | ICD-10-CM | POA: Insufficient documentation

## 2017-04-04 DIAGNOSIS — R1011 Right upper quadrant pain: Secondary | ICD-10-CM | POA: Insufficient documentation

## 2017-04-04 MED ORDER — ONDANSETRON 4 MG PO TBDP: 4.0000 mg | ORAL_TABLET | Freq: Three times a day (TID) | ORAL | 0 refills | Status: DC | PRN

## 2017-04-04 NOTE — Patient Instructions (Signed)
I have ordered your ultrasound of your abdomen.  We will work on getting scheduled for you.  In the meantime, I have prescribed Zofran to use every 8 hours if needed for nausea and vomiting.  Be mindful that this can cause you to be sleepy so do not drive while taking this medicine.  Make sure that you are getting plenty of fluids.  If your symptoms worsen, you vomit or put blood have blood in your stool, you develop severe abdominal pain or you are unable to stay hydrated please seek immediate medical attention in the emergency department   Nausea and Vomiting, Adult Feeling sick to your stomach (nausea) means that your stomach is upset or you feel like you have to throw up (vomit). Feeling more and more sick to your stomach can lead to throwing up. Throwing up happens when food and liquid from your stomach are thrown up and out the mouth. Throwing up can make you feel weak and cause you to get dehydrated. Dehydration can make you tired and thirsty, make you have a dry mouth, and make it so you pee (urinate) less often. Older adults and people with other diseases or a weak defense system (immune system) are at higher risk for dehydration. If you feel sick to your stomach or if you throw up, it is important to follow instructions from your doctor about how to take care of yourself. Follow these instructions at home: Eating and drinking Follow these instructions as told by your doctor:  Take an oral rehydration solution (ORS). This is a drink that is sold at pharmacies and stores.  Drink clear fluids in small amounts as you are able, such as: ? Water. ? Ice chips. ? Diluted fruit juice. ? Low-calorie sports drinks.  Eat bland, easy-to-digest foods in small amounts as you are able, such as: ? Bananas. ? Applesauce. ? Rice. ? Low-fat (lean) meats. ? Toast. ? Crackers.  Avoid fluids that have a lot of sugar or caffeine in them.  Avoid alcohol.  Avoid spicy or fatty foods.  General  instructions  Drink enough fluid to keep your pee (urine) clear or pale yellow.  Wash your hands often. If you cannot use soap and water, use hand sanitizer.  Make sure that all people in your home wash their hands well and often.  Take over-the-counter and prescription medicines only as told by your doctor.  Rest at home while you get better.  Watch your condition for any changes.  Breathe slowly and deeply when you feel sick to your stomach.  Keep all follow-up visits as told by your doctor. This is important. Contact a doctor if:  You have a fever.  You cannot keep fluids down.  Your symptoms get worse.  You have new symptoms.  You feel sick to your stomach for more than two days.  You feel light-headed or dizzy.  You have a headache.  You have muscle cramps. Get help right away if:  You have pain in your chest, neck, arm, or jaw.  You feel very weak or you pass out (faint).  You throw up again and again.  You see blood in your throw-up.  Your throw-up looks like black coffee grounds.  You have bloody or black poop (stools) or poop that look like tar.  You have a very bad headache, a stiff neck, or both.  You have a rash.  You have very bad pain, cramping, or bloating in your belly (abdomen).  You have trouble  breathing.  You are breathing very quickly.  Your heart is beating very quickly.  Your skin feels cold and clammy.  You feel confused.  You have pain when you pee.  You have signs of dehydration, such as: ? Dark pee, hardly any pee, or no pee. ? Cracked lips. ? Dry mouth. ? Sunken eyes. ? Sleepiness. ? Weakness. These symptoms may be an emergency. Do not wait to see if the symptoms will go away. Get medical help right away. Call your local emergency services (911 in the [place] Do not drive yourself to the hospital. This information is not intended to replace advice given to you by your health care provider. Make sure you discuss any  questions you have with your health care provider. Document Released: 07/19/2007 Document Revised: 08/20/2015 Document Reviewed: 10/06/2014 Elsevier Interactive Patient Education  2018 [employer].

## 2017-04-04 NOTE — Progress Notes (Signed)
Subjective: [name] and vomiting PCP: Janora Norlander, DO HPI:[name] [name] is a 67 y.o. female presenting to clinic today for:  1. Nausea/ vomiting Patient notes that she has had nausea and vomiting for the last several days.  She actually notes that she woke up on Sunday evening with right-sided chest pain and upper abdominal pain.  She called the ambulance to transfer to the emergency department.  In the emergency department, she was ruled out for ACS.  She is actually had a recent stress test that was low risk.  She has called her gastroenterologist for an appointment and expects to see Dr. Henrene Pastor next week.  She notes associated diarrhea that is nonbloody.  Her vomit is nonbloody.  Denies fevers.  No known consumption of undercooked foods or foods that have been left out too long.  No sick contacts.  She has been taking Protonix twice daily with little improvement in symptoms.  She notes that she has been attempting to eat a bland diet as well to reduce symptoms.  She was able to drink a little bit of water this morning but has not had her morning medications or any food because of nausea.   ROS: Per HPI  Allergies  Allergen Reactions  . Amlodipine Swelling  . Cetirizine & Related Other (See Comments)    Extreme Drowsiness - "knocks me loopy for 2 days"  . Latex Rash  . Levofloxacin Nausea And Vomiting  . Lisinopril Other (See Comments)    Excessive mucus production  . Prednisone Other (See Comments)    High dose - hallucinations  . Tape Rash   Past Medical History:  Diagnosis Date  . Anxiety   . Complication of anesthesia    difficult waking up "  . Depression   . GERD (gastroesophageal reflux disease)   . Heart murmur    as child  . Hemorrhoids    will occ bleed   . Hypertension   . IBS (irritable bowel syndrome)   . Renal cyst, left    cyst left kidney    Current Outpatient Medications:  .  aspirin 81 MG tablet, Take 81 mg by mouth daily., Disp: , Rfl:  .   Aspirin-Acetaminophen-Caffeine (EXCEDRIN PO), Take by mouth., Disp: , Rfl:  .  atorvastatin (LIPITOR) 20 MG tablet, Take 1 tablet (20 mg total) by mouth daily., Disp: 90 tablet, Rfl: 1 .  FLUoxetine (PROZAC) 10 MG capsule, Take 1 capsule (10 mg total) by mouth daily., Disp: 90 capsule, Rfl: 1 .  nitroGLYCERIN (NITROSTAT) 0.4 MG SL tablet, Place 1 tablet (0.4 mg total) under the tongue every 5 (five) minutes as needed for chest pain., Disp: 75 tablet, Rfl: 1 .  pantoprazole (PROTONIX) 40 MG tablet, Take 1 tablet (40 mg total) by mouth daily. (Patient taking differently: Take 40 mg by mouth 2 (two) times daily. ), Disp: 90 tablet, Rfl: 0 .  spironolactone (ALDACTONE) 25 MG tablet, Take 0.5 tablets (12.5 mg total) by mouth daily., Disp: 45 tablet, Rfl: 1   Social History   Socioeconomic History  . Marital status: Divorced    Spouse name: Not on file  . Number of children: Not on file  . Years of education: Not on file  . Highest education level: Not on file  Social Needs  . Financial resource strain: Not on file  . Food insecurity - worry: Not on file  . Food insecurity - inability: Not on file  . Transportation needs - medical: Not on  file  . Transportation needs - non-medical: Not on file  Occupational History  . Not on file  Tobacco Use  . Smoking status: Former Smoker    Last attempt to quit: 05/23/2006    Years since quitting: 10.8  . Smokeless tobacco: Never Used  Substance and Sexual Activity  . Alcohol use: Yes    Alcohol/week: 0.0 oz    Comment: rare glass of wine  . Drug use: No  . Sexual activity: Not Currently    Birth control/protection: Post-menopausal  Other Topics Concern  . Not on file  Social History Narrative  . Not on file   Family History  Problem Relation Age of Onset  . Stroke Mother   . Diabetes Mother        history of diabetes  . Heart failure Mother   . Cancer Father 23       bone cancer  . Drug abuse Sister   . Cancer Brother        throat  .  Alcohol abuse Sister   . Colon cancer Neg Hx     Objective: Office vital signs reviewed. BP 114/69   Pulse 80   Temp (!) 97.1 F (36.2 C) (Oral)   Ht 5\' 3"  (1.6 m)   Wt 167 lb (75.8 kg)   BMI 29.58 kg/m   Physical Examination:  General: Awake, alert, nontoxic but appears ill, No acute distress HEENT: Moist mucous membranes, sclera white Pulm: Normal work of breathing on room air GI: soft, non-tender, non-distended, bowel sounds hypoactive x4, no hepatomegaly, no splenomegaly, no masses; negative McBurney's and negative Murphy's.  No peritoneal signs. Neuro: Follows all commands.  No focal neurologic deficits.  Alert and oriented x3.  Assessment/ Plan: 67 y.o. female   1. Non-intractable vomiting with nausea, unspecified vomiting type Zofran 4 mg ODT dissolved every 8 hours as needed nausea and vomiting prescribed.  Caution sedation.  Push oral fluids.  Will check right upper quadrant ultrasound to rule out gallbladder involvement.  It appears that they attempted to do this in the emergency department but did not have staffing to check ultrasound.  Patient is currently fasting so we will attempt to get this scheduled this morning.  Home care instructions reviewed with the patient.  Reasons for emergent evaluation in the emergency department reviewed and handout was provided.  Will contact patient with results of ultrasound once they are available.  Keep appointment with GI. - US Abdomen Limited RUQ; Future  2. Right upper quadrant abdominal pain - US Abdomen Limited RUQ; Future   Orders Placed This Encounter  Procedures  . US Abdomen Limited RUQ    Standing Status:   Future    Standing Expiration Date:   06/03/2018    Order Specific Question:   Reason for Exam (SYMPTOM  OR DIAGNOSIS REQUIRED)    Answer:   nausea, vomiting, abdominal pain    Order Specific Question:   Preferred imaging location?    Answer:   Internal   Meds ordered this encounter  Medications  . ondansetron  (ZOFRAN ODT) 4 MG disintegrating tablet    Sig: Take 1 tablet (4 mg total) by mouth every 8 (eight) hours as needed for nausea or vomiting.    Dispense:  20 tablet    Refill:  [place], DO [place] [phone]

## 2017-04-05 ENCOUNTER — Encounter (HOSPITAL_COMMUNITY): Payer: Medicare Other

## 2017-04-09 ENCOUNTER — Ambulatory Visit: Payer: Medicare HMO | Admitting: Cardiovascular Disease

## 2017-04-12 ENCOUNTER — Ambulatory Visit: Payer: Medicare Other | Admitting: Physician Assistant

## 2017-04-16 ENCOUNTER — Ambulatory Visit
Admission: RE | Admit: 2017-04-16 | Discharge: 2017-04-16 | Disposition: A | Discharge status: Home / Self Care | Payer: Medicare HMO | Source: Ambulatory Visit | Attending: Family Medicine | Admitting: Family Medicine

## 2017-04-16 DIAGNOSIS — Z1231 Encounter for screening mammogram for malignant neoplasm of breast: Secondary | ICD-10-CM

## 2017-05-09 ENCOUNTER — Telehealth: Payer: Self-pay | Admitting: Family Medicine

## 2017-05-10 NOTE — Telephone Encounter (Signed)
Spoke with pt and she states she had sent a request in for a refill on her Protonix and it was denied so she wanted to know why. Pt advised she was last seen on 05/29/16 for routine chronic f/u with labs and since then has only been seen for acute problems so she needed to schedule a f/u for chronic medical conditions and also have her blood work. Pt voiced understanding and states she has some protonix left and she will call back to schedule her f/u.

## 2017-05-17 ENCOUNTER — Other Ambulatory Visit: Payer: Self-pay | Admitting: Cardiovascular Disease

## 2017-05-17 NOTE — Telephone Encounter (Signed)
°*  STAT* If patient is at the pharmacy, call can be transferred to refill team.   1. Which medications need to be refilled? (please list name of each medication and dose if known) pantoprazole (PROTONIX) 40 MG tablet  2. Which pharmacy/location (including street and city if local pharmacy) is medication to be sent to? Take 1 tablet (40 mg total) by mouth daily. Patient taking differently: Take 40 mg by mouth 2 (two) times daily.   3. Do they need a 30 day or 90 day supply? [place]

## 2017-05-17 NOTE — Telephone Encounter (Signed)
This should go through her PCP.  [name] PA-C 05/17/2017 1:15 PM

## 2017-05-24 ENCOUNTER — Other Ambulatory Visit: Payer: Self-pay | Admitting: Cardiovascular Disease

## 2017-05-24 NOTE — Telephone Encounter (Signed)
REFILL 

## 2017-07-31 ENCOUNTER — Telehealth: Payer: Self-pay | Admitting: Family Medicine

## 2017-08-01 ENCOUNTER — Ambulatory Visit (INDEPENDENT_AMBULATORY_CARE_PROVIDER_SITE_OTHER): Payer: Medicare HMO | Admitting: Family Medicine

## 2017-08-01 VITALS — BP 126/63 | HR 55 | Temp 97.2°F | Ht 63.0 in | Wt 161.0 lb

## 2017-08-01 DIAGNOSIS — F419 Anxiety disorder, unspecified: Secondary | ICD-10-CM

## 2017-08-01 DIAGNOSIS — R300 Dysuria: Secondary | ICD-10-CM

## 2017-08-01 DIAGNOSIS — I10 Essential (primary) hypertension: Secondary | ICD-10-CM | POA: Diagnosis not present

## 2017-08-01 DIAGNOSIS — F321 Major depressive disorder, single episode, moderate: Secondary | ICD-10-CM | POA: Diagnosis not present

## 2017-08-01 DIAGNOSIS — K219 Gastro-esophageal reflux disease without esophagitis: Secondary | ICD-10-CM | POA: Insufficient documentation

## 2017-08-01 DIAGNOSIS — R69 Illness, unspecified: Secondary | ICD-10-CM | POA: Diagnosis not present

## 2017-08-01 DIAGNOSIS — K649 Unspecified hemorrhoids: Secondary | ICD-10-CM | POA: Diagnosis not present

## 2017-08-01 DIAGNOSIS — R7301 Impaired fasting glucose: Secondary | ICD-10-CM | POA: Diagnosis not present

## 2017-08-01 DIAGNOSIS — Z7689 Persons encountering health services in other specified circumstances: Secondary | ICD-10-CM | POA: Diagnosis not present

## 2017-08-01 LAB — CBC WITH DIFFERENTIAL/PLATELET
Basophils Absolute: 0 10*3/uL (ref 0.0–0.2)
Basos: 0 %
EOS (ABSOLUTE): 0.2 10*3/uL (ref 0.0–0.4)
Eos: 2 %
Hematocrit: 44.3 % (ref 34.0–46.6)
Hemoglobin: 14.5 g/dL (ref 11.1–15.9)
Immature Grans (Abs): 0 10*3/uL (ref 0.0–0.1)
Immature Granulocytes: 0 %
Lymphocytes Absolute: 2.6 10*3/uL (ref 0.7–3.1)
Lymphs: 38 %
MCH: 28.6 pg (ref 26.6–33.0)
MCHC: 32.7 g/dL (ref 31.5–35.7)
MCV: 87 fL (ref 79–97)
Monocytes Absolute: 0.5 10*3/uL (ref 0.1–0.9)
Monocytes: 7 %
Neutrophils Absolute: 3.5 10*3/uL (ref 1.4–7.0)
Neutrophils: 53 %
Platelets: 433 10*3/uL (ref 150–450)
RBC: 5.07 x10E6/uL (ref 3.77–5.28)
RDW: 14.7 % (ref 12.3–15.4)
WBC: 6.7 10*3/uL (ref 3.4–10.8)

## 2017-08-01 LAB — URINALYSIS, COMPLETE
Bilirubin, UA: NEGATIVE
Glucose, UA: NEGATIVE
Ketones, UA: NEGATIVE
Leukocytes, UA: NEGATIVE
Nitrite, UA: NEGATIVE
Protein, UA: NEGATIVE
RBC, UA: NEGATIVE
Specific Gravity, UA: 1.02 (ref 1.005–1.030)
Urobilinogen, Ur: 0.2 mg/dL (ref 0.2–1.0)
pH, UA: 6 (ref 5.0–7.5)

## 2017-08-01 LAB — MICROSCOPIC EXAMINATION
Bacteria, UA: NONE SEEN
RBC, UA: NONE SEEN /hpf (ref 0–2)
Renal Epithel, UA: NONE SEEN /hpf

## 2017-08-01 LAB — BASIC METABOLIC PANEL
BUN/Creatinine Ratio: 12 (ref 12–28)
BUN: 9 mg/dL (ref 8–27)
CO2: 26 mmol/L (ref 20–29)
Calcium: 9.2 mg/dL (ref 8.7–10.3)
Chloride: 105 mmol/L (ref 96–106)
Creatinine, Ser: 0.76 mg/dL (ref 0.57–1.00)
GFR calc Af Amer: 95 mL/min/{1.73_m2} (ref >59)
GFR calc non Af Amer: 82 mL/min/{1.73_m2} (ref >59)
Glucose: 88 mg/dL (ref 65–99)
Potassium: 4.3 mmol/L (ref 3.5–5.2)
Sodium: 142 mmol/L (ref 134–144)

## 2017-08-01 MED ORDER — PANTOPRAZOLE SODIUM 40 MG PO TBEC: 40.0000 mg | DELAYED_RELEASE_TABLET | Freq: Every day | ORAL | 1 refills | Status: DC

## 2017-08-01 MED ORDER — BUPROPION HCL ER (XL) 150 MG PO TB24: 150.0000 mg | ORAL_TABLET | Freq: Every day | ORAL | 1 refills | Status: DC

## 2017-08-01 NOTE — Progress Notes (Signed)
Subjective: CC: multiple concerns PCP: Janora Norlander, DO HPI:[name] [name] is a 67 y.o. female presenting to clinic today for:  1. Depressive symptoms Patient reports return of her depressive symptoms.  She notes that she actually had good control with the fluoxetine 10 mg but that she "hit a wall" with the medication citing that she had headaches after using it for a while.  She did self discontinue the medication.  She has started seeing a therapist to help with coping of the stress that she attributes to a strained relationship with her daughter.  She is wanting to go on something different to help with symptoms.  She does site that she is had adverse side effects to many of the SSRI class and is considering Wellbutrin for control of symptoms since this has worked well for her daughter.  Denies any SI, HI, visual or auditory hallucinations.  She does feel like she is starting to remember other traumas from her youth.  She reports poor sleep related to the stress.  2.  Jaw pain Patient notes that she has had 3-4 episodes of her jaw feeling like it is locking.  She saw her dentist, who took x-rays.  She notes that a significant overbite was appreciated but no other abnormalities to explain.  She is wondering if she should see an oral surgeon for this.  She is not currently using a night guard.  3.  Bleeding hemorrhoid/GERD Patient reports that she has had one episode of about 2 tablespoons of blood loss after a large bowel movement.  She wonders how much blood loss is "too much blood loss."  She notes that she saw gastroenterology in the last couple of years and she is not expected to have a repeat colonoscopy for several years.  Denies any dizziness or blood loss from other places.  No unplanned weight loss.  Patient reports that she uses Protonix as needed.  She was placed on twice daily dosing after being hospitalized but has never had a stomach ulcer or upper GI bleed.  4.   Hypertension Patient reports that her blood pressures have been well controlled on the spironolactone.  She notes that they actually go quite low at home.  She is wondering if we can discontinue the spironolactone because she thinks that her blood pressures in office were reflective of her "being too excited".  Denies any chest pain, shortness of breath, lower extremity edema or visual disturbance.  5.  Dysuria Patient reports that she had onset of "kidney pain" and urinary symptoms yesterday.  Denies any hematuria, fevers, chills.  She would like her urine checked today.   ROS: Per HPI  Allergies  Allergen Reactions  . Amlodipine Swelling  . Cetirizine & Related Other (See Comments)    Extreme Drowsiness - "knocks me loopy for 2 days"  . Latex Rash  . Levofloxacin Nausea And Vomiting  . Lisinopril Other (See Comments)    Excessive mucus production  . Prednisone Other (See Comments)    High dose - hallucinations  . Tape Rash   Past Medical History:  Diagnosis Date  . Anxiety   . Complication of anesthesia    difficult waking up "  . Depression   . GERD (gastroesophageal reflux disease)   . Heart murmur    as child  . Hemorrhoids    will occ bleed   . Hypertension   . IBS (irritable bowel syndrome)   . Renal cyst, left    cyst  left kidney    Current Outpatient Medications:  .  aspirin 81 MG tablet, Take 81 mg by mouth daily., Disp: , Rfl:  .  Aspirin-Acetaminophen-Caffeine (EXCEDRIN PO), Take by mouth., Disp: , Rfl:  .  atorvastatin (LIPITOR) 20 MG tablet, TAKE 1 TABLET BY MOUTH  DAILY, Disp: 90 tablet, Rfl: 1 .  pantoprazole (PROTONIX) 40 MG tablet, Take 1 tablet (40 mg total) by mouth daily. (Patient taking differently: Take 40 mg by mouth 2 (two) times daily. ), Disp: 90 tablet, Rfl: 0 .  spironolactone (ALDACTONE) 25 MG tablet, TAKE ONE-HALF TABLET BY  MOUTH DAILY, Disp: 45 tablet, Rfl: 1 .  nitroGLYCERIN (NITROSTAT) 0.4 MG SL tablet, Place 1 tablet (0.4 mg total)  under the tongue every 5 (five) minutes as needed for chest pain., Disp: 75 tablet, Rfl: 1 Social History   Socioeconomic History  . Marital status: Divorced    Spouse name: Not on file  . Number of children: Not on file  . Years of education: Not on file  . Highest education level: Not on file  Occupational History  . Not on file  Social Needs  . Financial resource strain: Not on file  . Food insecurity:    Worry: Not on file    Inability: Not on file  . Transportation needs:    Medical: Not on file    Non-medical: Not on file  Tobacco Use  . Smoking status: Former Smoker    Last attempt to quit: 05/23/2006    Years since quitting: 11.2  . Smokeless tobacco: Never Used  Substance and Sexual Activity  . Alcohol use: Yes    Alcohol/week: 0.0 oz    Comment: rare glass of wine  . Drug use: No  . Sexual activity: Not Currently    Birth control/protection: Post-menopausal  Lifestyle  . Physical activity:    Days per week: Not on file    Minutes per session: Not on file  . Stress: Not on file  Relationships  . Social connections:    Talks on phone: Not on file    Gets together: Not on file    Attends religious service: Not on file    Active member of club or organization: Not on file    Attends meetings of clubs or organizations: Not on file    Relationship status: Not on file  . Intimate partner violence:    Fear of current or ex partner: Not on file    Emotionally abused: Not on file    Physically abused: Not on file    Forced sexual activity: Not on file  Other Topics Concern  . Not on file  Social History Narrative  . Not on file   Family History  Problem Relation Age of Onset  . Stroke Mother   . Diabetes Mother        history of diabetes  . Heart failure Mother   . Cancer Father 29       bone cancer  . Drug abuse Sister   . Cancer Brother        throat  . Alcohol abuse Sister   . Colon cancer Neg Hx   . Breast cancer Neg Hx     Objective: Office  vital signs reviewed. BP 126/63   Pulse (!) 55   Temp (!) 97.2 F (36.2 C) (Oral)   Ht 5\' 3"  (1.6 m)   Wt 161 lb (73 kg)   BMI 28.52 kg/m   Physical Examination:  General:  Awake, alert, well nourished, No acute distress HEENT: Normal    Eyes: PERRLA, extraocular membranes intact, sclera white    Throat: moist mucus membranes Cardio: regular rate and rhythm, S1S2 heard, no murmurs appreciated Pulm: clear to auscultation bilaterally, no wheezes, rhonchi or rales; normal work of breathing on room air Extremities: warm, well perfused, No edema, cyanosis or clubbing; +2 pulses bilaterally MSK: normal gait and normal station; no CVA TTP Psych: Speech somewhat pressured.  Good eye contact, mood stable, does not appear to be responding to internal stimuli. Depression screen [facility] 2/9 08/01/2017 03/01/2017 02/22/2017  Decreased Interest 2 0 0  Down, Depressed, Hopeless 1 0 0  PHQ - 2 Score 3 0 0  Altered sleeping 3 - 0  Tired, decreased energy 2 - 0  Change in appetite 2 - 0  Feeling bad or failure about yourself  1 - 0  Trouble concentrating 2 - 0  Moving slowly or fidgety/restless 0 - 0  Suicidal thoughts 0 - 0  PHQ-9 Score 13 - 0  Difficult doing work/chores Very difficult - Not difficult at all   GAD 7 : Generalized Anxiety Score 02/22/2017 01/17/2017  Nervous, Anxious, on Edge 1 3  Control/stop worrying 0 2  Worry too much - different things 0 1  Trouble relaxing 1 1  Restless 0 0  Easily annoyed or irritable 0 1  Afraid - awful might happen 0 2  Total GAD 7 Score 2 10  Anxiety Difficulty - Not difficult at all    Assessment/ Plan: 67 y.o. female   1. Current moderate episode of major depressive disorder without prior episode [facility]) We discussed the risks of Wellbutrin use in the setting of anxiety.  Patient wishes to proceed with medication.  I prescribed her Wellbutrin XL 150 mg p.o. daily.  Continue seeing the therapist.  Follow-up in the next 4 to 6 weeks for recheck.  2.  Anxiety As above  3. Essential hypertension Blood pressure is controlled.  She is actually not taken her spironolactone today.  We discussed trialing off of medication for the next 2 weeks.  She will contact me with her blood pressure readings.  If blood pressures go above 150/90, patient will start the medication again. - Basic Metabolic Panel  4. Bleeding hemorrhoid Not bleeding currently.  We discussed consideration for seeing the gastroenterologist again if this resumes.  May need to have banding procedure performed.  Keep bowel movements soft.  Follow-up as needed. - CBC with Differential  5. Gastroesophageal reflux disease without esophagitis Okay to continue Protonix if needed for GERD as needed.  I advised her to use this only once daily instead of twice daily if she is never had an upper GI bleed.  She was good understanding.  6. Dysuria She is afebrile and nontoxic-appearing.  Exam was negative for CVA tenderness.  May be MSK related.  We will check UA today and contact patient with results. - Urinalysis, Complete   Orders Placed This Encounter  Procedures  . Urinalysis, Complete  . Basic Metabolic Panel  . CBC with Differential   Meds ordered this encounter  Medications  . buPROPion (WELLBUTRIN XL) 150 MG 24 hr tablet    Sig: Take 1 tablet (150 mg total) by mouth daily.    Dispense:  30 tablet    Refill:  1  . pantoprazole (PROTONIX) 40 MG tablet    Sig: Take 1 tablet (40 mg total) by mouth daily.    Dispense:  90 tablet  Refill:  [place], [place] [phone]

## 2017-08-01 NOTE — Patient Instructions (Signed)
You had labs performed today.  You will be contacted with the results of the labs once they are available, usually in the next 3 business days for routine lab work.  If you had a pap smear or biopsy performed, expect to be contacted in about 7-10 days.  Follow up on Wellbutrin/ Depression in 4 weeks.  Taking the medicine as directed and not missing any doses is one of the best things you can do to treat your depression.  Here are some things to keep in mind:  1) Side effects (stomach upset, some increased anxiety) may happen before you notice a benefit.  These side effects typically go away over time. 2) Changes to your dose of medicine or a change in medication all together is sometimes necessary 3) Most people need to be on medication at least 12 months 4) Many people will notice an improvement within two weeks but the full effect of the medication can take up to 4-6 weeks 5) Stopping the medication when you start feeling better often results in a return of symptoms 6) Never discontinue your medication without contacting a health care professional first.  Some medications require gradual discontinuation/ taper and can make you sick if you stop them abruptly.  If your symptoms worsen or you have thoughts of suicide/homicide, PLEASE SEEK IMMEDIATE MEDICAL ATTENTION.  You may always call:  National Suicide Hotline: 4122228310 [place]: 336-755-6033 Crisis Recovery in [place]: [phone]   These are available 24 hours a day, 7 days a week.

## 2017-08-03 ENCOUNTER — Encounter: Payer: Self-pay | Admitting: Internal Medicine

## 2017-08-06 ENCOUNTER — Telehealth: Payer: Self-pay | Admitting: Family Medicine

## 2017-08-06 NOTE — Telephone Encounter (Signed)
Patient was concerned with A1C at 5.9.  She was thinking that maybe Dr. Lajuana Ripple would want her to start on medication for this.  I explained to her that Dr. Lajuana Ripple had reviewed her labs and did not feel she needed meds at this time.  I advised her to be aware of her carbohydrate intake at this time and we would check this again later.

## 2017-08-06 NOTE — Telephone Encounter (Signed)
Left message to call back  

## 2017-08-10 ENCOUNTER — Telehealth: Payer: Self-pay | Admitting: Family Medicine

## 2017-08-10 LAB — SPECIMEN STATUS REPORT

## 2017-08-10 NOTE — Telephone Encounter (Signed)
Pt aware that Dr Darnell Level will not be back until Monday

## 2017-08-13 NOTE — Telephone Encounter (Signed)
She needs to be on the 150mg  for 1 month.  It takes up to 6 weeks before maximal affect can be seen on current dose. We can discuss increasing at our next visit.

## 2017-08-13 NOTE — Telephone Encounter (Signed)
Patient notified

## 2017-08-14 LAB — HGB A1C W/O EAG: Hgb A1c MFr Bld: 5.9 % — ABNORMAL HIGH (ref 4.8–5.6)

## 2017-08-14 LAB — SPECIMEN STATUS REPORT

## 2017-08-18 ENCOUNTER — Emergency Department (HOSPITAL_COMMUNITY)
Admission: EM | Admit: 2017-08-18 | Discharge: 2017-08-18 | Disposition: A | Discharge status: Home / Self Care | Payer: Medicare HMO | Attending: Emergency Medicine | Admitting: Emergency Medicine

## 2017-08-18 ENCOUNTER — Encounter (HOSPITAL_COMMUNITY): Payer: Self-pay | Admitting: *Deleted

## 2017-08-18 DIAGNOSIS — L500 Allergic urticaria: Secondary | ICD-10-CM | POA: Diagnosis not present

## 2017-08-18 DIAGNOSIS — Z951 Presence of aortocoronary bypass graft: Secondary | ICD-10-CM | POA: Diagnosis not present

## 2017-08-18 DIAGNOSIS — I10 Essential (primary) hypertension: Secondary | ICD-10-CM | POA: Diagnosis not present

## 2017-08-18 DIAGNOSIS — T7840XA Allergy, unspecified, initial encounter: Secondary | ICD-10-CM | POA: Diagnosis not present

## 2017-08-18 DIAGNOSIS — Z79899 Other long term (current) drug therapy: Secondary | ICD-10-CM | POA: Insufficient documentation

## 2017-08-18 DIAGNOSIS — R21 Rash and other nonspecific skin eruption: Secondary | ICD-10-CM

## 2017-08-18 DIAGNOSIS — Z87891 Personal history of nicotine dependence: Secondary | ICD-10-CM | POA: Insufficient documentation

## 2017-08-18 DIAGNOSIS — Z7982 Long term (current) use of aspirin: Secondary | ICD-10-CM | POA: Diagnosis not present

## 2017-08-18 MED ORDER — FAMOTIDINE 20 MG PO TABS
20.0000 mg | ORAL_TABLET | Freq: Once | ORAL | Status: AC
  Administered 2017-08-18: 20 mg via ORAL
  Filled 2017-08-18: qty 1

## 2017-08-18 MED ORDER — FAMOTIDINE 20 MG PO TABS: 20.0000 mg | ORAL_TABLET | Freq: Two times a day (BID) | ORAL | 0 refills | Status: DC

## 2017-08-18 MED ORDER — METHYLPREDNISOLONE SODIUM SUCC 125 MG IJ SOLR
80.0000 mg | Freq: Once | INTRAMUSCULAR | Status: AC
  Administered 2017-08-18: 80 mg via INTRAMUSCULAR
  Filled 2017-08-18: qty 2

## 2017-08-18 NOTE — ED Provider Notes (Signed)
[facility] Provider Note   CSN: [id number] Arrival date & time: 08/18/17  1722     History   Chief Complaint Chief Complaint  Patient presents with  . Allergic Reaction    HPI [name] [name] is a 67 y.o. female.  The history is provided by the patient.  Allergic Reaction  Presenting symptoms: itching and rash   Presenting symptoms: no wheezing   Severity:  Moderate Duration:  1 day Prior allergic episodes:  No prior episodes Context comment:  Unknown. Pt stayed with a friend last night who has cats, but the itching started while she was at a holiday event before she stayed at the home last night Relieved by:  Nothing Worsened by:  Nothing Ineffective treatments:  Antihistamines (Pt took one benadryl prior to arrival without relief.)   Past Medical History:  Diagnosis Date  . Anxiety   . Complication of anesthesia    difficult waking up "  . Depression   . GERD (gastroesophageal reflux disease)   . Heart murmur    as child  . Hemorrhoids    will occ bleed   . Hypertension   . IBS (irritable bowel syndrome)   . Renal cyst, left    cyst left kidney    Patient Active Problem List   Diagnosis Date Noted  . Bleeding hemorrhoid 08/01/2017  . Gastroesophageal reflux disease without esophagitis 08/01/2017  . Atrophic vaginitis 08/26/2015  . BMI 29.0-29.9,adult 04/08/2015  . Essential hypertension 11/21/2014  . Hyperlipidemia LDL goal <70 11/21/2014  . Bradycardia 10/10/2013  . Chest pain with moderate risk of acute coronary syndrome 11/20/2012  . S/P CABG x 2 06/19/2011  . Unstable angina ([place]) 05/23/2011  . Anxiety 05/23/2011  . Depression 05/23/2011  . Renal cyst 05/23/2011  . History of smoking, quit 2008 05/23/2011    Past Surgical History:  Procedure Laterality Date  . ABDOMINAL HYSTERECTOMY  2003  . CARDIAC CATHETERIZATION  05/22/2011  . CORONARY ARTERY BYPASS GRAFT  05/26/2011   Procedure: CORONARY ARTERY BYPASS GRAFTING (CABG);   Surgeon: Ivin Poot, MD;  Location: [facility];  Service: Open Heart Surgery;  Laterality: N/A;  Coronary Artery Bypass Graft on Pump times two utlizing left internal mammary artery and right greater saphenous vein harvested endoscopically   . CORONARY ARTERY BYPASS GRAFT     done by Dr. Kerby Less  . LEFT HEART CATHETERIZATION WITH CORONARY ANGIOGRAM N/A 05/24/2011   Procedure: LEFT HEART CATHETERIZATION WITH CORONARY ANGIOGRAM;  Surgeon: Leonie Man, MD;  Location: [facility] CATH LAB;  Service: Cardiovascular;  Laterality: N/A;  . TONSILLECTOMY  1962  . TUBAL LIGATION       OB History   None      Home Medications    Prior to Admission medications   Medication Sig Start Date End Date Taking? Authorizing Provider  aspirin 81 MG tablet Take 81 mg by mouth daily.    [provider]  Aspirin-Acetaminophen-Caffeine (EXCEDRIN PO) Take by mouth.    [provider]  atorvastatin (LIPITOR) 20 MG tablet TAKE 1 TABLET BY MOUTH  DAILY 05/24/17   Troy Sine, MD  buPROPion (WELLBUTRIN XL) 150 MG 24 hr tablet Take 1 tablet (150 mg total) by mouth daily. 08/01/17   Janora Norlander, DO  famotidine (PEPCID) 20 MG tablet Take 1 tablet (20 mg total) by mouth 2 (two) times daily. 08/18/17   Evalee Jefferson, PA-C  nitroGLYCERIN (NITROSTAT) 0.4 MG SL tablet Place 1 tablet (0.4 mg total)  under the tongue every 5 (five) minutes as needed for chest pain. 04/03/17 07/02/17  Troy Sine, MD  pantoprazole (PROTONIX) 40 MG tablet Take 1 tablet (40 mg total) by mouth daily. 08/01/17   Janora Norlander, DO  spironolactone (ALDACTONE) 25 MG tablet TAKE ONE-HALF TABLET BY  MOUTH DAILY 05/24/17   Troy Sine, MD    Family History Family History  Problem Relation Age of Onset  . Stroke Mother   . Diabetes Mother        history of diabetes  . Heart failure Mother   . Cancer Father 69       bone cancer  . Drug abuse Sister   . Cancer Brother        throat  . Alcohol abuse Sister   . Colon  cancer Neg Hx   . Breast cancer Neg Hx     Social History Social History   Tobacco Use  . Smoking status: Former Smoker    Last attempt to quit: 05/23/2006    Years since quitting: 11.2  . Smokeless tobacco: Never Used  Substance Use Topics  . Alcohol use: Yes    Alcohol/week: 0.0 oz    Comment: rare glass of wine  . Drug use: No     Allergies   Amlodipine; Cetirizine & related; Latex; Levofloxacin; Lisinopril; Prednisone; and Tape   Review of Systems Review of Systems  Constitutional: Negative for chills and fever.  HENT: Negative.   Respiratory: Negative for shortness of breath and wheezing.   Cardiovascular: Negative.   Gastrointestinal: Negative.   Skin: Positive for itching and rash.  Neurological: Negative for numbness.     Physical Exam Updated Vital Signs BP 140/84   Pulse 74   Temp 98 F (36.7 C) (Oral)   Resp 16   Wt 74.1 kg (163 lb 6.4 oz)   BMI 28.95 kg/m   Physical Exam  Constitutional: She appears well-developed and well-nourished. No distress.  HENT:  Head: Normocephalic.  Neck: Neck supple.  Cardiovascular: Normal rate.  Pulmonary/Chest: Effort normal. She has no wheezes.  Musculoskeletal: Normal range of motion. She exhibits no edema.  Skin: Rash noted. Rash is macular and urticarial. There is erythema.  Large areas of erythematous rash on abdomen and groin, most confluent in the intertriginous spaces.  No desquamation, no drainage. Rash is dry.     ED Treatments / Results  Labs (all labs ordered are listed, but only abnormal results are displayed) Labs Reviewed - No data to display  EKG None  Radiology No results found.  Procedures Procedures (including critical care time)  Medications Ordered in ED Medications  famotidine (PEPCID) tablet 20 mg (20 mg Oral Given 08/18/17 1809)  methylPREDNISolone sodium succinate (SOLU-MEDROL) 125 mg/2 mL injection 80 mg (80 mg Intramuscular Given 08/18/17 1810)     Initial Impression /  Assessment and Plan / ED Course  I have reviewed the triage vital signs and the nursing notes.  Pertinent labs & imaging results that were available during my care of the patient were reviewed by me and considered in my medical decision making (see chart for details).     Pt reports having hallucinatory reaction to high dose steroids in the past, has been able to tolerate lower doses.  She was given an IM dose of solumedrol 80 mg here, pepcid started, advised to continue benadryl, keep cool, tepid baths, ice packs, anti itch creams. Plan f/u with pcp if not improving, recheck here for any worsening  sx. Pt has no sob, wheezing, edema, also no n/v or other GI complaints suggesting anaphylactic reaction.  No obvious new triggers for this rash which does appear to allergic.  Pt also seen by Dr. Roderic Palau who formulated plan.   Final Clinical Impressions(s) / ED Diagnoses   Final diagnoses:  Allergic reaction, initial encounter  Rash    ED Discharge Orders        Ordered    famotidine (PEPCID) 20 MG tablet  2 times daily     08/18/17 1804       Evalee Jefferson, PA-C 08/18/17 1821    Milton Ferguson, MD 08/19/17 1506

## 2017-08-18 NOTE — ED Triage Notes (Signed)
Pt is here for itching and rash. Pt states that the itching is most in axilla, under breasts and is now spreading to stomach.  Pt states that she began having itching yesterday and began feeling worse today.  Pt states that she did not change any soaps or lotions but that she has been visiting with a friend that has several cats and she wonders if she may be allergic to cats.  Pt denies any sob, no swelling in face.  Pt took benadryl 42min PTA

## 2017-08-18 NOTE — Discharge Instructions (Signed)
Your reaction appears to be an allergic reaction to some unknown trigger. The timing is not suggestive that it is to your new medicine Wellbutrin since you have already been on this for a few weeks.  Continue taking your benadryl 25 -50 mg (1-2 capsules) every 6 hours until your rash is resolved. In addition, you have been prescribed pepcid which is a different anti histamine than benadryl and works in conjunction with benadryl to get better control of allergic reactions. Get rechecked here or by your doctor for any worsened symptoms, or symptoms lasting more than just a few days.  Cool or tepid baths can help alleviate itching, as can topical anti itch creams such as [place] anti itch cream, although your rash is so widespread, you may choose to treat just the most problematic locations with this cream. Return here immediately for any shortness of breath or any swelling of face, mouth, lips, tongue or throat.

## 2017-08-19 ENCOUNTER — Other Ambulatory Visit: Payer: Self-pay

## 2017-08-19 ENCOUNTER — Encounter (HOSPITAL_COMMUNITY): Payer: Self-pay | Admitting: Emergency Medicine

## 2017-08-19 ENCOUNTER — Emergency Department (HOSPITAL_COMMUNITY)
Admission: EM | Admit: 2017-08-19 | Discharge: 2017-08-19 | Disposition: A | Discharge status: Home / Self Care | Payer: Medicare HMO | Attending: Emergency Medicine | Admitting: Emergency Medicine

## 2017-08-19 DIAGNOSIS — Z5321 Procedure and treatment not carried out due to patient leaving prior to being seen by health care provider: Secondary | ICD-10-CM | POA: Diagnosis not present

## 2017-08-19 DIAGNOSIS — T7840XA Allergy, unspecified, initial encounter: Secondary | ICD-10-CM | POA: Diagnosis not present

## 2017-08-19 NOTE — ED Notes (Signed)
Registration called and advised that pt came to them and advised that she was leaving,

## 2017-08-19 NOTE — ED Triage Notes (Signed)
Returns for allergic reaction, worse today, pt has taken benadryl and Pepcid twice today without relief.

## 2017-08-20 ENCOUNTER — Ambulatory Visit (INDEPENDENT_AMBULATORY_CARE_PROVIDER_SITE_OTHER): Payer: Medicare HMO | Admitting: Family Medicine

## 2017-08-20 ENCOUNTER — Encounter: Payer: Self-pay | Admitting: Family Medicine

## 2017-08-20 VITALS — BP 122/73 | HR 67 | Temp 97.9°F | Ht 63.0 in | Wt 163.0 lb

## 2017-08-20 DIAGNOSIS — J029 Acute pharyngitis, unspecified: Secondary | ICD-10-CM

## 2017-08-20 DIAGNOSIS — R21 Rash and other nonspecific skin eruption: Secondary | ICD-10-CM | POA: Diagnosis not present

## 2017-08-20 DIAGNOSIS — T7840XA Allergy, unspecified, initial encounter: Secondary | ICD-10-CM | POA: Diagnosis not present

## 2017-08-20 LAB — RAPID STREP SCREEN (MED CTR MEBANE ONLY): Strep Gp A Ag, IA W/Reflex: NEGATIVE

## 2017-08-20 LAB — CULTURE, GROUP A STREP

## 2017-08-20 MED ORDER — SPIRONOLACTONE 25 MG PO TABS: 12.5000 mg | ORAL_TABLET | Freq: Every day | ORAL | 1 refills | Status: DC

## 2017-08-20 MED ORDER — METHYLPREDNISOLONE ACETATE 80 MG/ML IJ SUSP
80.0000 mg | Freq: Once | INTRAMUSCULAR | Status: AC
  Administered 2017-08-20: 80 mg via INTRAMUSCULAR

## 2017-08-20 MED ORDER — LORATADINE 10 MG PO TABS: 10.0000 mg | ORAL_TABLET | Freq: Every day | ORAL | 11 refills | Status: DC

## 2017-08-20 MED ORDER — ATORVASTATIN CALCIUM 20 MG PO TABS: 20.0000 mg | ORAL_TABLET | Freq: Every day | ORAL | 1 refills | Status: DC

## 2017-08-20 MED ORDER — METHYLPREDNISOLONE 4 MG PO TBPK: ORAL_TABLET | ORAL | 0 refills | Status: DC

## 2017-08-20 NOTE — ED Notes (Signed)
Follow up call made  No answer  08/20/17  0833  s Allix Blomquist rn

## 2017-08-20 NOTE — Patient Instructions (Signed)
Add Claritin to your regimen.  Take 1 Claritin tablet daily.  You may continue the Benadryl as tolerated up to 4 times daily.  Continue the Pepcid twice a day.  I have added a Medrol Dosepak to help with the rash.  If symptoms worsen, you develop shortness of breath, difficulty swallowing or facial swelling, please seek immediate medical attention the emergency department.   Hives Hives (urticaria) are itchy, red, swollen areas on your skin. Hives can appear on any part of your body and can vary in size. They can be as small as the tip of a pen or much larger. Hives often fade within 24 hours (acute hives). In other cases, new hives appear after old ones fade. This cycle can continue for several days or weeks (chronic hives). Hives result from your body's reaction to an irritant or to something that you are allergic to (trigger). When you are exposed to a trigger, your body releases a chemical (histamine) that causes redness, itching, and swelling. You can get hives immediately after being exposed to a trigger or hours later. Hives do not spread from person to person (are not contagious). Your hives may get worse with scratching, exercise, and emotional stress. What are the causes? Causes of this condition include:  Allergies to certain foods or ingredients.  Insect bites or stings.  Exposure to pollen or pet dander.  Contact with latex or chemicals.  Spending time in sunlight, heat, or cold (exposure).  Exercise.  Stress.  You can also get hives from some medical conditions and treatments. These include:  Viruses, including the common cold.  Bacterial infections, such as urinary tract infections and strep throat.  Disorders such as vasculitis, lupus, or thyroid disease.  Certain medications.  Allergy shots.  Blood transfusions.  Sometimes, the cause of hives is not known (idiopathic hives). What increases the risk? This condition is more likely to develop  in:  Women.  People who have food allergies, especially to citrus fruits, milk, eggs, peanuts, tree nuts, or shellfish.  People who are allergic to: ? Medicines. ? Latex. ? Insects. ? Animals. ? Pollen.  People who have certain medical conditions, includinglupus or thyroid disease.  What are the signs or symptoms? The main symptom of this condition is raised, itchyred or white bumps or patches on your skin. These areas may:  Become large and swollen (welts).  Change in shape and location, quickly and repeatedly.  Be separate hives or connect over a large area of skin.  Sting or become painful.  Turn white when pressed in the center (blanch).  In severe cases, yourhands, feet, and face may also become swollen. This may occur if hives develop deeper in your skin. How is this diagnosed? This condition is diagnosed based on your symptoms, medical history, and physical exam. Your skin, urine, or blood may be tested to find out what is causing your hives and to rule out other health issues. Your health care provider may also remove a small sample of skin from the affected area and examine it under a microscope (biopsy). How is this treated? Treatment depends on the severity of your condition. Your health care provider may recommend using cool, wet cloths (cool compresses) or taking cool showers to relieve itching. Hives are sometimes treated with medicines, including:  Antihistamines.  Corticosteroids.  Antibiotics.  An injectable medicine (omalizumab). Your health care provider may prescribe this if you have chronic idiopathic hives and you continue to have symptoms even after treatment with antihistamines.  Severe cases may require an emergency injection of adrenaline (epinephrine) to prevent a life-threatening allergic reaction (anaphylaxis). Follow these instructions at home: Medicines  Take or apply over-the-counter and prescription medicines only as told by your  health care provider.  If you were prescribed an antibiotic medicine, use it as told by your health care provider. Do not stop taking the antibiotic even if you start to feel better. Skin Care  Apply cool compresses to the affected areas.  Do not scratch or rub your skin. General instructions  Do not take hot showers or baths. This can make itching worse.  Do not wear tight-fitting clothing.  Use sunscreen and wear protective clothing when you are outside.  Avoid any substances that cause your hives. Keep a journal to help you track what causes your hives. Write down: ? What medicines you take. ? What you eat and drink. ? What products you use on your skin.  Keep all follow-up visits as told by your health care provider. This is important. Contact a health care provider if:  Your symptoms are not controlled with medicine.  Your joints are painful or swollen. Get help right away if:  You have a fever.  You have pain in your abdomen.  Your tongue or lips are swollen.  Your eyelids are swollen.  Your chest or throat feels tight.  You have trouble breathing or swallowing. These symptoms may represent a serious problem that is an emergency. Do not wait to see if the symptoms will go away. Get medical help right away. Call your local emergency services (911 in the [place] Do not drive yourself to the hospital. This information is not intended to replace advice given to you by your health care provider. Make sure you discuss any questions you have with your health care provider. Document Released: 01/30/2005 Document Revised: 06/30/2015 Document Reviewed: 11/18/2014 Elsevier Interactive Patient Education  2018 [employer].

## 2017-08-20 NOTE — Progress Notes (Signed)
Subjective: CC: rash PCP: Janora Norlander, DO HPI:[name] [name] is a 67 y.o. female presenting to clinic today for:  1. Rash Patient was seen for a rash on 08/18/2017 in the emergency department at [facility].  She was treated with a dose of IM Solu-Medrol, Pepcid and Benadryl.  Home care instructions were to continue Pepcid and Benadryl.  There was no evidence of anaphylaxis during that visit.  She returned the following day with persistent symptoms but left without being seen.  She notes that she has been using the Pepcid and Benadryl twice daily with little improvement in symptoms.  She did feel like the Solu-Medrol helped some with the itching.  She does note sedation with the Benadryl.  No known exposures.  No new soaps, lotions, detergents, foods, pets, hair products.  She reports itching from head to toe.  She has a rash along the chest, abdomen and back.  Denies any shortness of breath.  She does report intermittent right upper quadrant pain which she has had in the past.  This is evaluated by ultrasound previously, which was negative.  She has an appoint with Dr. Henrene Pastor in September.  She takes her Protonix intermittently.   We discussed her reaction to prednisone in the past.  She notes that she "thought she saw her sister's blue jeans when she looked out the window".  She realized that this was her daughter after her daughter opened the door.  ROS: Per HPI  Allergies  Allergen Reactions  . Amlodipine Swelling  . Cetirizine & Related Other (See Comments)    Extreme Drowsiness - "knocks me loopy for 2 days"  . Latex Rash  . Levofloxacin Nausea And Vomiting  . Lisinopril Other (See Comments)    Excessive mucus production  . Prednisone Other (See Comments)    High dose - hallucinations  . Tape Rash   Past Medical History:  Diagnosis Date  . Anxiety   . Complication of anesthesia    difficult waking up "  . Depression   . GERD (gastroesophageal reflux disease)   .  Heart murmur    as child  . Hemorrhoids    will occ bleed   . Hypertension   . IBS (irritable bowel syndrome)   . Renal cyst, left    cyst left kidney    Current Outpatient Medications:  .  aspirin 81 MG tablet, Take 81 mg by mouth daily., Disp: , Rfl:  .  Aspirin-Acetaminophen-Caffeine (EXCEDRIN PO), Take by mouth., Disp: , Rfl:  .  atorvastatin (LIPITOR) 20 MG tablet, TAKE 1 TABLET BY MOUTH  DAILY, Disp: 90 tablet, Rfl: 1 .  buPROPion (WELLBUTRIN XL) 150 MG 24 hr tablet, Take 1 tablet (150 mg total) by mouth daily., Disp: 30 tablet, Rfl: 1 .  famotidine (PEPCID) 20 MG tablet, Take 1 tablet (20 mg total) by mouth 2 (two) times daily., Disp: 20 tablet, Rfl: 0 .  nitroGLYCERIN (NITROSTAT) 0.4 MG SL tablet, Place 1 tablet (0.4 mg total) under the tongue every 5 (five) minutes as needed for chest pain., Disp: 75 tablet, Rfl: 1 .  pantoprazole (PROTONIX) 40 MG tablet, Take 1 tablet (40 mg total) by mouth daily., Disp: 90 tablet, Rfl: 1 .  spironolactone (ALDACTONE) 25 MG tablet, TAKE ONE-HALF TABLET BY  MOUTH DAILY, Disp: 45 tablet, Rfl: 1 Social History   Socioeconomic History  . Marital status: Divorced    Spouse name: Not on file  . Number of children: Not on file  .  Years of education: Not on file  . Highest education level: Not on file  Occupational History  . Not on file  Social Needs  . Financial resource strain: Not on file  . Food insecurity:    Worry: Not on file    Inability: Not on file  . Transportation needs:    Medical: Not on file    Non-medical: Not on file  Tobacco Use  . Smoking status: Former Smoker    Last attempt to quit: 05/23/2006    Years since quitting: 11.2  . Smokeless tobacco: Never Used  Substance and Sexual Activity  . Alcohol use: Yes    Alcohol/week: 0.0 oz    Comment: rare glass of wine  . Drug use: No  . Sexual activity: Not Currently    Birth control/protection: Post-menopausal  Lifestyle  . Physical activity:    Days per week: Not on  file    Minutes per session: Not on file  . Stress: Not on file  Relationships  . Social connections:    Talks on phone: Not on file    Gets together: Not on file    Attends religious service: Not on file    Active member of club or organization: Not on file    Attends meetings of clubs or organizations: Not on file    Relationship status: Not on file  . Intimate partner violence:    Fear of current or ex partner: Not on file    Emotionally abused: Not on file    Physically abused: Not on file    Forced sexual activity: Not on file  Other Topics Concern  . Not on file  Social History Narrative  . Not on file   Family History  Problem Relation Age of Onset  . Stroke Mother   . Diabetes Mother        history of diabetes  . Heart failure Mother   . Cancer Father 17       bone cancer  . Drug abuse Sister   . Cancer Brother        throat  . Alcohol abuse Sister   . Colon cancer Neg Hx   . Breast cancer Neg Hx     Objective: Office vital signs reviewed. BP 122/73   Pulse 67   Temp 97.9 F (36.6 C) (Oral)   Ht 5\' 3"  (1.6 m)   Wt 163 lb (73.9 kg)   SpO2 94%   BMI 28.87 kg/m   Physical Examination:  General: Awake, alert, well nourished, No acute distress HEENT: Normal    Eyes: PERRLA, extraocular membranes intact, sclera white    Throat: moist mucus membranes, no erythema; tonsils surgically absent.  No mucosal swelling appreciated.  Airway is patent Pulm: Normal work of breathing on room air Extremities: warm, well perfused, No edema, cyanosis or clubbing; +2 pulses bilaterally Skin: dry; intact; blanching, splotchy, erythematous maculopapular rash appreciated along the entire trunk.  No vesicles or skin breakdown appreciated. Psych: Anxious, mood stable, does not appear to be responding to internal stimuli  Assessment/ Plan: 67 y.o. female   1. Rash in adult Appears to be some type of allergic dermatitis.  No evidence of anaphylaxis on today's exam.  I suspect  that she has had no resolution in symptoms because she received only a single dose of corticosteroid.  We discussed her adverse reaction to oral corticosteroids and a bit more detail during today's visit and I think that the benefits of oral corticosteroids  outweighs the risks.  She was given an additional dose of Depo-Medrol 80 mg here in office.  I have prescribed her a Medrol Dosepak to start tomorrow.  Instructions for use were reviewed with the patient.  She voiced good understanding.  Continue Benadryl every 6 hours if tolerated, Pepcid twice daily.  Add Claritin 10 mg daily.  I also asked that she use her Protonix daily given corticosteroid use.  We discussed reasons for emergent evaluation the emergency department.  She will follow-up as needed. - methylPREDNISolone acetate (DEPO-MEDROL) injection 80 mg  2. Allergic reaction, initial encounter Of unknown etiology. - methylPREDNISolone acetate (DEPO-MEDROL) injection 80 mg  3. Sore throat Strep was negative. - Rapid Strep Screen ([facility] ONLY)   Orders Placed This Encounter  Procedures  . Rapid Strep Screen ([facility] ONLY)   Meds ordered this encounter  Medications  . methylPREDNISolone (MEDROL DOSEPAK) 4 MG TBPK tablet    Sig: Take as directed    Dispense:  21 tablet    Refill:  0  . methylPREDNISolone acetate (DEPO-MEDROL) injection 80 mg  . loratadine (CLARITIN) 10 MG tablet    Sig: Take 1 tablet (10 mg total) by mouth daily.    Dispense:  30 tablet    Refill:  11  . atorvastatin (LIPITOR) 20 MG tablet    Sig: Take 1 tablet (20 mg total) by mouth daily.    Dispense:  90 tablet    Refill:  1  . spironolactone (ALDACTONE) 25 MG tablet    Sig: Take 0.5 tablets (12.5 mg total) by mouth daily.    Dispense:  45 tablet    Refill:  [place], Melissa [phone]

## 2017-08-21 ENCOUNTER — Other Ambulatory Visit: Payer: Self-pay | Admitting: Family Medicine

## 2017-08-21 ENCOUNTER — Telehealth: Payer: Self-pay | Admitting: Family Medicine

## 2017-08-21 NOTE — Telephone Encounter (Signed)
I contacted the patient to find out what symptoms she is having and she said since taking the Prednisone dose pack and Loratadine she was feeling better and did not feel as if she needed anything now.  Advised her if anything changed, to give [place] a call.

## 2017-08-23 ENCOUNTER — Other Ambulatory Visit: Payer: Self-pay | Admitting: Family Medicine

## 2017-08-31 ENCOUNTER — Ambulatory Visit (INDEPENDENT_AMBULATORY_CARE_PROVIDER_SITE_OTHER): Payer: Medicare HMO | Admitting: Family Medicine

## 2017-08-31 ENCOUNTER — Encounter: Payer: Self-pay | Admitting: Family Medicine

## 2017-08-31 VITALS — BP 138/82 | HR 66 | Temp 98.4°F | Ht 63.0 in | Wt 159.0 lb

## 2017-08-31 DIAGNOSIS — Z780 Asymptomatic menopausal state: Secondary | ICD-10-CM | POA: Diagnosis not present

## 2017-08-31 DIAGNOSIS — Z23 Encounter for immunization: Secondary | ICD-10-CM | POA: Diagnosis not present

## 2017-08-31 DIAGNOSIS — F330 Major depressive disorder, recurrent, mild: Secondary | ICD-10-CM

## 2017-08-31 DIAGNOSIS — R69 Illness, unspecified: Secondary | ICD-10-CM | POA: Diagnosis not present

## 2017-08-31 DIAGNOSIS — F419 Anxiety disorder, unspecified: Secondary | ICD-10-CM | POA: Diagnosis not present

## 2017-08-31 MED ORDER — ALBUTEROL SULFATE HFA 108 (90 BASE) MCG/ACT IN AERS: 2.0000 | INHALATION_SPRAY | Freq: Four times a day (QID) | RESPIRATORY_TRACT | 0 refills | Status: DC | PRN

## 2017-08-31 NOTE — Progress Notes (Signed)
Subjective: CC: depression/ anxiety PCP: Janora Norlander, DO HPI:[name] [name] is a 67 y.o. female presenting to clinic today for:  1. Depression/ anxiety Patient follows up for depression anxiety.  She is been slowly weaning herself off of Wellbutrin, as she developed an allergic reaction of unknown etiology about 1-1/2 to 2 weeks into the medication.  It was difficult to tell if the Wellbutrin caused allergic reaction or if she came in contact with something else.  Since Medrol Dosepak, allergic reaction has been totally resolved, despite continued use of Wellbutrin intermittently.  Patient has felt like her breathing has been somewhat altered.  She reports sometimes having difficulty taking a deep breath in.  Denies any wheezing.  She thinks that she may need a refill on her rescue inhaler.  No chest pain.  Patient notes that she is considering revisiting Wellbutrin after she is seen her new cardiologist in September and her gastroenterologist.  She will follow-up at that time to decide whether or not she would like to revisit this medicine, as it did control her symptoms.   ROS: Per HPI  Allergies  Allergen Reactions  . Amlodipine Swelling  . Cetirizine & Related Other (See Comments)    Extreme Drowsiness - "knocks me loopy for 2 days"  . Latex Rash  . Levofloxacin Nausea And Vomiting  . Lisinopril Other (See Comments)    Excessive mucus production  . Prednisone Other (See Comments)    High dose - hallucinations  . Tape Rash   Past Medical History:  Diagnosis Date  . Anxiety   . Complication of anesthesia    difficult waking up "  . Depression   . GERD (gastroesophageal reflux disease)   . Heart murmur    as child  . Hemorrhoids    will occ bleed   . Hypertension   . IBS (irritable bowel syndrome)   . Renal cyst, left    cyst left kidney    Current Outpatient Medications:  .  aspirin 81 MG tablet, Take 81 mg by mouth daily., Disp: , Rfl:  .   Aspirin-Acetaminophen-Caffeine (EXCEDRIN PO), Take by mouth., Disp: , Rfl:  .  atorvastatin (LIPITOR) 20 MG tablet, Take 1 tablet (20 mg total) by mouth daily., Disp: 90 tablet, Rfl: 1 .  buPROPion (WELLBUTRIN XL) 150 MG 24 hr tablet, TAKE 1 TABLET BY MOUTH EVERY DAY, Disp: 90 tablet, Rfl: 0 .  famotidine (PEPCID) 20 MG tablet, Take 1 tablet (20 mg total) by mouth 2 (two) times daily., Disp: 20 tablet, Rfl: 0 .  loratadine (CLARITIN) 10 MG tablet, Take 1 tablet (10 mg total) by mouth daily., Disp: 30 tablet, Rfl: 11 .  methylPREDNISolone (MEDROL DOSEPAK) 4 MG TBPK tablet, Take as directed, Disp: 21 tablet, Rfl: 0 .  pantoprazole (PROTONIX) 40 MG tablet, Take 1 tablet (40 mg total) by mouth daily., Disp: 90 tablet, Rfl: 1 .  spironolactone (ALDACTONE) 25 MG tablet, Take 0.5 tablets (12.5 mg total) by mouth daily., Disp: 45 tablet, Rfl: 1 .  nitroGLYCERIN (NITROSTAT) 0.4 MG SL tablet, Place 1 tablet (0.4 mg total) under the tongue every 5 (five) minutes as needed for chest pain., Disp: 75 tablet, Rfl: 1 .  PREMARIN vaginal cream, PLACE 1 APPLICATORFUL VAGINALLY TWICE A WEEK (Patient not taking: Reported on 08/31/2017), Disp: 30 g, Rfl: 5 Social History   Socioeconomic History  . Marital status: Divorced    Spouse name: Not on file  . Number of children: Not on file  .  Years of education: Not on file  . Highest education level: Not on file  Occupational History  . Not on file  Social Needs  . Financial resource strain: Not on file  . Food insecurity:    Worry: Not on file    Inability: Not on file  . Transportation needs:    Medical: Not on file    Non-medical: Not on file  Tobacco Use  . Smoking status: Former Smoker    Last attempt to quit: 05/23/2006    Years since quitting: 11.2  . Smokeless tobacco: Never Used  Substance and Sexual Activity  . Alcohol use: Yes    Alcohol/week: 0.0 oz    Comment: rare glass of wine  . Drug use: No  . Sexual activity: Not Currently    Birth  control/protection: Post-menopausal  Lifestyle  . Physical activity:    Days per week: Not on file    Minutes per session: Not on file  . Stress: Not on file  Relationships  . Social connections:    Talks on phone: Not on file    Gets together: Not on file    Attends religious service: Not on file    Active member of club or organization: Not on file    Attends meetings of clubs or organizations: Not on file    Relationship status: Not on file  . Intimate partner violence:    Fear of current or ex partner: Not on file    Emotionally abused: Not on file    Physically abused: Not on file    Forced sexual activity: Not on file  Other Topics Concern  . Not on file  Social History Narrative  . Not on file   Family History  Problem Relation Age of Onset  . Stroke Mother   . Diabetes Mother        history of diabetes  . Heart failure Mother   . Cancer Father 73       bone cancer  . Drug abuse Sister   . Cancer Brother        throat  . Alcohol abuse Sister   . Colon cancer Neg Hx   . Breast cancer Neg Hx     Objective: Office vital signs reviewed. BP 138/82 (BP Location: Right Arm, Patient Position: Sitting, Cuff Size: Normal)   Pulse 66   Temp 98.4 F (36.9 C) (Oral)   Ht 5\' 3"  (1.6 m)   Wt 159 lb (72.1 kg)   SpO2 95%   BMI 28.17 kg/m   Physical Examination:  General: Awake, alert, well nourished, No acute distress HEENT: Moist mucous membranes, sclera white Cardio: regular rate and rhythm, S1S2 heard, no murmurs appreciated Pulm: clear to auscultation bilaterally, no wheezes, rhonchi or rales; normal work of breathing on room air Psych: Mood stable, speech normal, affect appropriate, does not appear to be responding to internal stimuli  Depression screen [facility] 2/9 08/31/2017 08/20/2017 08/01/2017  Decreased Interest 0 0 2  Down, Depressed, Hopeless 0 0 1  PHQ - 2 Score 0 0 3  Altered sleeping - - 3  Tired, decreased energy - - 2  Change in appetite - - 2  Feeling  bad or failure about yourself  - - 1  Trouble concentrating - - 2  Moving slowly or fidgety/restless - - 0  Suicidal thoughts - - 0  PHQ-9 Score - - 13  Difficult doing work/chores - - Very difficult   GAD 7 : Generalized Anxiety  Score 08/31/2017 02/22/2017 01/17/2017  Nervous, Anxious, on Edge 0 1 3  Control/stop worrying 0 0 2  Worry too much - different things 0 0 1  Trouble relaxing 1 1 1   Restless 0 0 0  Easily annoyed or irritable 0 0 1  Afraid - awful might happen 0 0 2  Total GAD 7 Score 1 2 10   Anxiety Difficulty Not difficult at all - Not difficult at all    Assessment/ Plan: 67 y.o. female   1. Anxiety Currently controlled.  She is weaning off of Wellbutrin.  We discussed that we may need to consider having her see a psychiatrist given her failure of many SSRIs.  She seemed to do well with the Wellbutrin class of medication.  We could consider possibly place her on Effexor if she does not use this medication in the past versus possibly Cymbalta.  We will discuss this further at her follow-up visit.  2. Mild episode of recurrent major depressive disorder ([place]) As above  3. Asymptomatic menopausal state - DG [facility] DEXA; Future  4. Need for tetanus, diphtheria, and acellular pertussis (Tdap) vaccine Tdap administered.   Orders Placed This Encounter  Procedures  . DG [facility] DEXA    Standing Status:   Future    Standing Expiration Date:   11/02/2018    Order Specific Question:   Reason for Exam (SYMPTOM  OR DIAGNOSIS REQUIRED)    Answer:   post menopausal estrogen deficiency  . Tdap vaccine greater than or equal to 7yo IM   Meds ordered this encounter  Medications  . albuterol (PROVENTIL HFA;VENTOLIN HFA) 108 (90 Base) MCG/ACT inhaler    Sig: Inhale 2 puffs into the lungs every 6 (six) hours as needed for wheezing or shortness of breath.    Dispense:  1 Inhaler    Refill:  [place], [place] [phone]

## 2017-09-01 ENCOUNTER — Encounter: Payer: Self-pay | Admitting: Cardiology

## 2017-09-13 ENCOUNTER — Encounter: Payer: Self-pay | Admitting: Family

## 2017-09-13 ENCOUNTER — Ambulatory Visit (INDEPENDENT_AMBULATORY_CARE_PROVIDER_SITE_OTHER): Payer: Medicare HMO | Admitting: Family

## 2017-09-13 VITALS — BP 126/66 | HR 56 | Temp 97.6°F | Ht 63.0 in | Wt 158.6 lb

## 2017-09-13 DIAGNOSIS — R300 Dysuria: Secondary | ICD-10-CM

## 2017-09-13 DIAGNOSIS — M545 Low back pain, unspecified: Secondary | ICD-10-CM

## 2017-09-13 DIAGNOSIS — R399 Unspecified symptoms and signs involving the genitourinary system: Secondary | ICD-10-CM | POA: Diagnosis not present

## 2017-09-13 DIAGNOSIS — R350 Frequency of micturition: Secondary | ICD-10-CM

## 2017-09-13 LAB — URINALYSIS, COMPLETE
Bilirubin, UA: NEGATIVE
Glucose, UA: NEGATIVE
Ketones, UA: NEGATIVE
Leukocytes, UA: NEGATIVE
Nitrite, UA: NEGATIVE
Protein, UA: NEGATIVE
RBC, UA: NEGATIVE
Specific Gravity, UA: 1.01 (ref 1.005–1.030)
Urobilinogen, Ur: 0.2 mg/dL (ref 0.2–1.0)
pH, UA: 5 (ref 5.0–7.5)

## 2017-09-13 LAB — MICROSCOPIC EXAMINATION
Bacteria, UA: NONE SEEN
RBC, UA: NONE SEEN /hpf (ref 0–2)
Renal Epithel, UA: NONE SEEN /hpf

## 2017-09-13 MED ORDER — CEPHALEXIN 500 MG PO CAPS: 500.0000 mg | ORAL_CAPSULE | Freq: Two times a day (BID) | ORAL | 0 refills | Status: DC

## 2017-09-13 MED ORDER — TRAMADOL HCL 50 MG PO TABS: 50.0000 mg | ORAL_TABLET | Freq: Two times a day (BID) | ORAL | 0 refills | Status: DC | PRN

## 2017-09-13 NOTE — Patient Instructions (Signed)

## 2017-09-13 NOTE — Progress Notes (Addendum)
Subjective:    Patient ID: [name] [name], female    DOB: 1950/10/23, 67 y.o.   MRN: [id number]  No chief complaint on file.    Urinary Frequency   This is a new problem. The current episode started 1 to 4 weeks ago. The problem occurs intermittently. The problem has been waxing and waning. The quality of the pain is described as burning. The pain is at a severity of 2/10. The pain is mild. There has been no fever. Associated symptoms include flank pain, frequency, nausea and urgency. Pertinent negatives include no hematuria, hesitancy or vomiting. She has tried increased fluids for the symptoms. The treatment provided mild relief.  Hip Pain    Back Pain  This is a recurrent problem. The current episode started 1 to 4 weeks ago. The problem occurs intermittently. The problem has been waxing and waning since onset. The pain is present in the lumbar spine. The quality of the pain is described as aching. The pain is at a severity of 6/10. The pain is moderate. She has tried NSAIDs and bed rest for the symptoms. The treatment provided mild relief.      Review of Systems  Gastrointestinal: Positive for nausea. Negative for vomiting.  Genitourinary: Positive for flank pain, frequency and urgency. Negative for hematuria and hesitancy.  Musculoskeletal: Positive for back pain.  All other systems reviewed and are negative.      Objective:   Physical Exam  Constitutional: She is oriented to person, place, and time. She appears well-developed and well-nourished. No distress.  HENT:  Head: Normocephalic.  Eyes: Pupils are equal, round, and reactive to light.  Neck: Normal range of motion. Neck supple. No thyromegaly present.  Cardiovascular: Normal rate, regular rhythm, normal heart sounds and intact distal pulses.  No murmur heard. Pulmonary/Chest: Effort normal and breath sounds normal. No respiratory distress. She has no wheezes.  Abdominal: Soft. Bowel sounds are normal. She exhibits no  distension. There is no tenderness.  Musculoskeletal: Normal range of motion. She exhibits no edema or tenderness.  Neurological: She is alert and oriented to person, place, and time. She has normal reflexes. No cranial nerve deficit.  Skin: Skin is warm and dry.  Psychiatric: She has a normal mood and affect. Her behavior is normal. Judgment and thought content normal.  Vitals reviewed.     BP 126/66   Pulse (!) 56   Temp 97.6 F (36.4 C) (Oral)   Ht 5\' 3"  (1.6 m)   Wt 158 lb 9.6 oz (71.9 kg)   BMI 28.09 kg/m      Assessment & Plan:  Diagnoses and all orders for this visit:  Dysuria -     Urinalysis, Complete -     Urine Culture  Urinary frequency -     Urine Culture  UTI symptoms -     cephALEXin (KEFLEX) 500 MG capsule; Take 1 capsule (500 mg total) by mouth 2 (two) times daily.  Acute bilateral low back pain without sciatica -     traMADol (ULTRAM) 50 MG tablet; Take 1 tablet (50 mg total) by mouth every 12 (twelve) hours as needed.  PT reviewed in [place] controlled database. She has not had any controlled substance filled within the last few years. I will give her an Ultram rx to help with her back pain. Discussed all further refills would have to be from PCP. She states she has taken Ultram in the past for this back pain and it helped and she  would only need a few to help get her through the next week or two.    Force fluids RTO prn Culture pending, will treat since having symptoms greater than a week    Evelina Dun, FNP

## 2017-09-13 NOTE — Addendum Note (Signed)
Addended by: Evelina Dun A on: 09/13/2017 11:30 AM   Modules accepted: Orders

## 2017-09-14 LAB — URINE CULTURE: Organism ID, Bacteria: NO GROWTH

## 2017-09-25 DIAGNOSIS — H43393 Other vitreous opacities, bilateral: Secondary | ICD-10-CM | POA: Diagnosis not present

## 2017-09-25 DIAGNOSIS — H524 Presbyopia: Secondary | ICD-10-CM | POA: Diagnosis not present

## 2017-10-03 ENCOUNTER — Ambulatory Visit: Payer: Medicare Other | Admitting: Internal Medicine

## 2017-10-17 DIAGNOSIS — R69 Illness, unspecified: Secondary | ICD-10-CM | POA: Diagnosis not present

## 2017-10-18 ENCOUNTER — Encounter: Payer: Self-pay | Admitting: Cardiology

## 2017-10-18 NOTE — Progress Notes (Signed)
Cardiology Office Note  Date: 10/19/2017   ID: [name] [name], DOB [birth year], MRN [id number]  PCP: [name] Norlander, DO  Primary Cardiologist: [name] Lesches, MD   Chief Complaint  Patient presents with  . Coronary Artery Disease    History of Present Illness: [name] [name] is a 67 y.o. female former patient of Dr. Claiborne [name], transferring care to the [facility] practice.  This is my first meeting with her today.  I reviewed extensive records and updated the chart.  She was most recently seen by Mr. [name] [name] in January.  She is status post CABG in 2013 by Dr. Prescott [name] including LIMA to the LAD and SVG to the OM 2.  Most recent ischemic evaluation was in February of this year as outlined below, overall low risk.  She presents today reporting no specific angina symptoms or nitroglycerin use.  She describes chronic fatigue and difficulty with insomnia at least over the last year.  She has not yet addressed this with her PCP, has some concerns that it might be related to her thyroid.  She has been trying to lose weight, recently on a keto diet, but last year was eating a vegetarian diet.  She does not exercise at all at this time.  We discussed this today.  She enjoys painting with acrylics.  States that she just recently sold her first painting.  I reviewed her medications which are outlined below.  From a cardiac perspective she is on aspirin, Lipitor, Aldactone, and as needed nitroglycerin.  She is taking the Lipitor every other day.  She has not had a follow-up lipid panel recently.  Past Medical History:  Diagnosis Date  . Anxiety   . CAD (coronary artery disease)    LM/circumflex disease s/p LIMA to LAD and SVG to OM2 2013  . Depression   . Essential hypertension   . GERD (gastroesophageal reflux disease)   . Hemorrhoids   . IBS (irritable bowel syndrome)   . Renal cyst, left     Past Surgical History:  Procedure Laterality Date  . ABDOMINAL HYSTERECTOMY  2003  .  CORONARY ARTERY BYPASS GRAFT  05/26/2011   Procedure: CORONARY ARTERY BYPASS GRAFTING (CABG);  Surgeon: [name] Poot, MD;  Location: [facility];  Service: Open Heart Surgery;  Laterality: N/A;  Coronary Artery Bypass Graft on Pump times two utlizing left internal mammary artery and right greater saphenous vein harvested endoscopically   . LEFT HEART CATHETERIZATION WITH CORONARY ANGIOGRAM N/A 05/24/2011   Procedure: LEFT HEART CATHETERIZATION WITH CORONARY ANGIOGRAM;  Surgeon: [name] Man, MD;  Location: [facility] CATH LAB;  Service: Cardiovascular;  Laterality: N/A;  . TONSILLECTOMY  1962  . TUBAL LIGATION      Current Outpatient Medications  Medication Sig Dispense Refill  . aspirin 81 MG tablet Take 81 mg by mouth daily.    . Aspirin-Acetaminophen-Caffeine (EXCEDRIN PO) Take by mouth.    Marland Kitchen atorvastatin (LIPITOR) 20 MG tablet Take 20 mg by mouth every other day.    . famotidine (PEPCID) 20 MG tablet Take 1 tablet (20 mg total) by mouth 2 (two) times daily. 20 tablet 0  . nitroGLYCERIN (NITROSTAT) 0.4 MG SL tablet Place 1 tablet (0.4 mg total) under the tongue every 5 (five) minutes as needed for chest pain. 75 tablet 1  . pantoprazole (PROTONIX) 40 MG tablet Take 40 mg by mouth daily as needed.    Marland Kitchen spironolactone (ALDACTONE) 25 MG tablet Take 0.5 tablets (12.5 mg total)  by mouth daily. 45 tablet 1  . traMADol (ULTRAM) 50 MG tablet Take 1 tablet (50 mg total) by mouth every 12 (twelve) hours as needed. 30 tablet 0   No current facility-administered medications for this visit.    Allergies:  Amlodipine; Cetirizine & related; Latex; Levofloxacin; Lisinopril; Prednisone; and Tape   Social History: The patient  reports that she quit smoking about 11 years ago. Her smoking use included cigarettes. She has never used smokeless tobacco. She reports that she drinks alcohol. She reports that she does not use drugs.   Family History: The patient's family history includes Alcohol abuse in her sister; Cancer  in her brother; Cancer (age of onset: 40) in her father; Diabetes in her mother; Drug abuse in her sister; Heart failure in her mother; Stroke in her mother.   ROS:  Please see the history of present illness. Otherwise, complete review of systems is positive for arthritic pains, insomnia.  All other systems are reviewed and negative.   Physical Exam: VS:  BP 132/62   Pulse 65   Ht 5\' 3"  (1.6 m)   Wt 161 lb 6.4 oz (73.2 kg)   SpO2 97% Comment: on room air  BMI 28.59 kg/m , BMI Body mass index is 28.59 kg/m.  Wt Readings from Last 3 Encounters:  10/19/17 161 lb 6.4 oz (73.2 kg)  09/13/17 158 lb 9.6 oz (71.9 kg)  08/31/17 159 lb (72.1 kg)    General: Patient appears comfortable at rest. HEENT: Conjunctiva and lids normal, oropharynx clear. Neck: Supple, no elevated JVP or carotid bruits, no thyromegaly. Lungs: Clear to auscultation, nonlabored breathing at rest. Cardiac: Regular rate and rhythm, no S3 or significant systolic murmur, no pericardial rub. Abdomen: Soft, nontender, bowel sounds present. Extremities: No pitting edema, distal pulses 2+. Skin: Warm and dry. Musculoskeletal: No kyphosis. Neuropsychiatric: Alert and oriented x3, affect grossly appropriate.  ECG: I personally reviewed the tracing from 04/02/2017 which showed sinus rhythm with nonspecific ST-T changes in the anteroseptal leads.  Recent Labwork: 04/02/2017: ALT 17; AST 16 08/01/2017: BUN 9; Creatinine, Ser 0.76; Hemoglobin 14.5; Platelets 433; Potassium 4.3; Sodium 142     Component Value Date/Time   CHOL 138 09/18/2016 0815   TRIG 68 09/18/2016 0815   HDL 57 09/18/2016 0815   CHOLHDL 2.4 09/18/2016 0815   CHOLHDL 2.3 11/18/2014 0928   VLDL 15 11/18/2014 0928   LDLCALC 67 09/18/2016 0815    Other Studies Reviewed Today:  Echocardiogram 11/26/2014: Study Conclusions  - Left ventricle: The cavity size was normal. Systolic function was   vigorous. The estimated ejection fraction was in the range of  65%   to 70%. Wall motion was normal; there were no regional wall   motion abnormalities. Doppler parameters are consistent with   abnormal left ventricular relaxation (grade 1 diastolic   dysfunction). There was no evidence of elevated ventricular   filling pressure by Doppler parameters. - Aortic valve: Trileaflet; normal thickness leaflets. There was no   regurgitation. - Aortic root: The aortic root was normal in size. - Ascending aorta: The ascending aorta was normal in size. - Mitral valve: Structurally normal valve. - Left atrium: The atrium was normal in size. - Right ventricle: The cavity size was normal. Wall thickness was   normal. Systolic function was normal. - Right atrium: The atrium was normal in size. - Tricuspid valve: There was mild regurgitation. - Pulmonic valve: There was no regurgitation. - Pulmonary arteries: Systolic pressure was within the normal  range. - Inferior vena cava: The vessel was normal in size. - Pericardium, extracardiac: There was no pericardial effusion.  Exercise Myoview 03/16/2017:  Equivocal ST segment depression in the setting of lead artifact. No chest pain was reported. There was a hypertensive response. No arrhythmias. Low risk Duke treadmill score of 5.  Blood pressure demonstrated a hypertensive response to exercise.  No significant myocardial perfusion defects to indicate scar or ischemia.  This is a low risk study.  Nuclear stress EF: 71%.  Cardiac catheterization 05/24/2011: Left Ventriculography:             EF:  60-65%             Wall Motion: normal  Coronary Angiographic Data:  Left Main:  Moderate to large caliber vessel that trifurcates into a small Ramus Intermedius as well as the LAD and Left Circumflex; the distal vessel tapers into a 60-70% lesion that is calcified, this lesion includes the ostium of the Left Circumflex.  Left Anterior Descending (LAD):  Moderate caliber vessel; ~20-30% lesion at the Diag 1 (D1)  branch point, otherwise minimal luminal irregularities.  1st diagonal (D1):  Small to moderate caliber vessel, minimal luminal irregularities  2nd diagonal (D2):  Small  caliber vessel, minimal luminal irregularities  Circumflex (LCx):  Moderate caliber vessel; ostial, calcified ~60% lesion  1st obtuse marginal:  Moderate caliber vessel, minimal luminal irregularities  2nd obtuse marginal:  Small caliber vessel, minimal luminal irregularities Distal LCx /3rd obtuse marginal:  Moderate caliber vessel with multiple branches; minimal luminal irregularities    Small AV Groove Branch:  Minimal luminal irregularities  Ramus Intermedius:  Very small caliber vessel, minimal luminal irregularities  Right Coronary Artery: Moderate caliber vessel; proximal 30% at first bend, 20% mid vessel then minimal luminal irregularities  right ventricle branch of right coronary artery: Small to moderate caliber vessel, minimal luminal irregularities  posterior descending artery: Moderate caliber vessel, minimal luminal irregularities  posterior lateral branches:  Moderate caliber vessel, minimal luminal irregularities  Assessment and Plan:  1.  CAD with history of LIMA to the LAD and SVG to the OM 2 in 2013.  She does not report any active angina symptoms and underwent a follow-up Myoview earlier this year that was low risk.  LVEF is normal.  I talked with her today about establishing a regular exercise plan such as walking.  This might also help her insomnia.  Continue aspirin and statin.  She has as needed nitroglycerin available.  2.  Mixed hyperlipidemia, taking Lipitor every other day at this point.  Her last LDL was 67 in June 2018.  I asked her to get a follow-up lipid panel this year with her PCP.  3.  Insomnia.  We talked about this some today, I recommended that she speak with her PCP in more detail.  She has been taking Benadryl but states that this is not been effective.  4.  Essential  hypertension, on Aldactone.  No changes made today.  Current medicines were reviewed with the patient today.  Disposition: Follow-up in 1 year.  Signed, Satira Sark, MD, [facility] 10/19/2017 8:58 AM    [place] [facility] [place], [place], [place] [address] Phone: [phone]; Fax: [phone]

## 2017-10-19 ENCOUNTER — Encounter: Payer: Self-pay | Admitting: Cardiology

## 2017-10-19 ENCOUNTER — Ambulatory Visit: Payer: Medicare HMO | Admitting: Cardiology

## 2017-10-19 VITALS — BP 132/62 | HR 65 | Ht 63.0 in | Wt 161.4 lb

## 2017-10-19 DIAGNOSIS — I25119 Atherosclerotic heart disease of native coronary artery with unspecified angina pectoris: Secondary | ICD-10-CM | POA: Diagnosis not present

## 2017-10-19 DIAGNOSIS — G4700 Insomnia, unspecified: Secondary | ICD-10-CM

## 2017-10-19 DIAGNOSIS — E782 Mixed hyperlipidemia: Secondary | ICD-10-CM | POA: Diagnosis not present

## 2017-10-19 DIAGNOSIS — I10 Essential (primary) hypertension: Secondary | ICD-10-CM | POA: Diagnosis not present

## 2017-10-19 NOTE — Patient Instructions (Addendum)
Medication Instructions:   Your physician recommends that you continue on your current medications as directed. Please refer to the Current Medication list given to you today.  Labwork:  NONE-obtain cholesterol lab work from your family doctor and have copy sent to Dr. Domenic Polite.  Testing/Procedures:  NONE  Follow-Up:  Your physician recommends that you schedule a follow-up appointment in: 1 year. You will receive a reminder letter in the mail in about 10 months reminding you to call and schedule your appointment. If you don't receive this letter, please contact our office.  Any Other Special Instructions Will Be Listed Below (If Applicable).  If you need a refill on your cardiac medications before your next appointment, please call your pharmacy.

## 2017-11-02 ENCOUNTER — Ambulatory Visit (INDEPENDENT_AMBULATORY_CARE_PROVIDER_SITE_OTHER): Payer: Medicare HMO | Admitting: Family Medicine

## 2017-11-02 ENCOUNTER — Encounter: Payer: Self-pay | Admitting: Family Medicine

## 2017-11-02 VITALS — BP 125/63 | HR 56 | Temp 97.9°F | Ht 63.0 in | Wt 159.0 lb

## 2017-11-02 DIAGNOSIS — F419 Anxiety disorder, unspecified: Secondary | ICD-10-CM

## 2017-11-02 DIAGNOSIS — I10 Essential (primary) hypertension: Secondary | ICD-10-CM | POA: Diagnosis not present

## 2017-11-02 DIAGNOSIS — E785 Hyperlipidemia, unspecified: Secondary | ICD-10-CM | POA: Diagnosis not present

## 2017-11-02 DIAGNOSIS — R7303 Prediabetes: Secondary | ICD-10-CM | POA: Diagnosis not present

## 2017-11-02 DIAGNOSIS — G479 Sleep disorder, unspecified: Secondary | ICD-10-CM

## 2017-11-02 DIAGNOSIS — R69 Illness, unspecified: Secondary | ICD-10-CM | POA: Diagnosis not present

## 2017-11-02 MED ORDER — DULOXETINE HCL 30 MG PO CPEP: 30.0000 mg | ORAL_CAPSULE | Freq: Every day | ORAL | 1 refills | Status: DC

## 2017-11-02 NOTE — Patient Instructions (Signed)
Come in for fasting labs.  Water and medication are ok to take that morning but no food.   Taking the medicine as directed and not missing any doses is one of the best things you can do to treat your depression/anxiety.  Here are some things to keep in mind:  1) Side effects (stomach upset, some increased anxiety) may happen before you notice a benefit.  These side effects typically go away over time. 2) Changes to your dose of medicine or a change in medication all together is sometimes necessary 3) Most people need to be on medication at least 12 months 4) Many people will notice an improvement within two weeks but the full effect of the medication can take up to 4-6 weeks 5) Stopping the medication when you start feeling better often results in a return of symptoms 6) Never discontinue your medication without contacting a health care professional first.  Some medications require gradual discontinuation/ taper and can make you sick if you stop them abruptly.  If your symptoms worsen or you have thoughts of suicide/homicide, PLEASE SEEK IMMEDIATE MEDICAL ATTENTION.  You may always call:  National Suicide Hotline: 607-600-4261 [place]: 262-771-0013 Crisis Recovery in [place]: 425-584-6415   These are available 24 hours a day, 7 days a week.

## 2017-11-02 NOTE — Progress Notes (Signed)
Subjective: CC: f/u cardiology visit/ sleep PCP: [name] Norlander, DO HPI:[name] [name] is a 67 y.o. female presenting to clinic today for:  1. Sleep difficulties Patient reports a long-standing history of sleep difficulties.  She reports that she was actually offered by Dr. Claiborne Billings several years ago to pursue a sleep study but she has not done this yet.  She notes that every night she has to sleep with a few pillows behind head in efforts to fall asleep.  She notes that she has a very prominent upper jaw and has obstruction otherwise.  She does not feel confident that the mask would help her sleep better.  She states that she is able to fall asleep but wakes up in the middle of the night and is unable to go back to sleep.  For instance, she was able to go to sleep at 9 PM and then woke up at 2 AM and could not go back to sleep.  Her cardiologist recommended that she start walking.  She notes she walked a mile yesterday and did fall asleep easily but again woke up in the middle the night.  She is found that tramadol seems to help her go to sleep but she does not want to become dependent on this medication.  She notes very rare use of the medicine, citing that she is only used 2 pills in total.  She is used Benadryl in the past for sleep.  She is also used Ambien in the past for sleep.  She wonders if generalized body aches, fatigue and depression/anxiety may be impacting her sleep.  She is interested in going back on Cymbalta if possible to see if this may help with her symptoms.  She notes that she discontinue this prematurely previously because she "thought she felt better".  She is going to resisted inpatient to discontinue medication just because she is feeling better at this go around.  2.  History of CABG/angina Patient followed up with her cardiologist recently who determined that she had a low risk stress test.  He recommended repeat lipid panel, as patient is using her cholesterol medication  every other day.  She was attributing the statin to possible body aches that she has been having.  No chest pain, shortness of breath.     ROS: Per HPI  Allergies  Allergen Reactions  . Amlodipine Swelling  . Cetirizine & Related Other (See Comments)    Extreme Drowsiness - "knocks me loopy for 2 days"  . Latex Rash  . Levofloxacin Nausea And Vomiting  . Lisinopril Other (See Comments)    Excessive mucus production  . Prednisone Other (See Comments)    High dose - hallucinations  . Tape Rash   Past Medical History:  Diagnosis Date  . Anxiety   . CAD (coronary artery disease)    LM/circumflex disease s/p LIMA to LAD and SVG to OM2 2013  . Depression   . Essential hypertension   . GERD (gastroesophageal reflux disease)   . Hemorrhoids   . IBS (irritable bowel syndrome)   . Renal cyst, left     Current Outpatient Medications:  .  aspirin 81 MG tablet, Take 81 mg by mouth daily., Disp: , Rfl:  .  Aspirin-Acetaminophen-Caffeine (EXCEDRIN PO), Take by mouth., Disp: , Rfl:  .  atorvastatin (LIPITOR) 20 MG tablet, Take 20 mg by mouth every other day., Disp: , Rfl:  .  famotidine (PEPCID) 20 MG tablet, Take 1 tablet (20 mg total)  by mouth 2 (two) times daily., Disp: 20 tablet, Rfl: 0 .  pantoprazole (PROTONIX) 40 MG tablet, Take 40 mg by mouth daily as needed., Disp: , Rfl:  .  spironolactone (ALDACTONE) 25 MG tablet, Take 0.5 tablets (12.5 mg total) by mouth daily., Disp: 45 tablet, Rfl: 1 .  traMADol (ULTRAM) 50 MG tablet, Take 1 tablet (50 mg total) by mouth every 12 (twelve) hours as needed., Disp: 30 tablet, Rfl: 0 .  nitroGLYCERIN (NITROSTAT) 0.4 MG SL tablet, Place 1 tablet (0.4 mg total) under the tongue every 5 (five) minutes as needed for chest pain., Disp: 75 tablet, Rfl: 1   Social History   Socioeconomic History  . Marital status: Divorced    Spouse name: Not on file  . Number of children: Not on file  . Years of education: Not on file  . Highest education level:  Not on file  Occupational History  . Not on file  Social Needs  . Financial resource strain: Not on file  . Food insecurity:    Worry: Not on file    Inability: Not on file  . Transportation needs:    Medical: Not on file    Non-medical: Not on file  Tobacco Use  . Smoking status: Former Smoker    Types: Cigarettes    Last attempt to quit: 05/23/2006    Years since quitting: 11.4  . Smokeless tobacco: Never Used  Substance and Sexual Activity  . Alcohol use: Yes    Alcohol/week: 0.0 standard drinks    Comment: rare glass of wine  . Drug use: No  . Sexual activity: Not Currently    Birth control/protection: Post-menopausal  Lifestyle  . Physical activity:    Days per week: Not on file    Minutes per session: Not on file  . Stress: Not on file  Relationships  . Social connections:    Talks on phone: Not on file    Gets together: Not on file    Attends religious service: Not on file    Active member of club or organization: Not on file    Attends meetings of clubs or organizations: Not on file    Relationship status: Not on file  . Intimate partner violence:    Fear of current or ex partner: Not on file    Emotionally abused: Not on file    Physically abused: Not on file    Forced sexual activity: Not on file  Other Topics Concern  . Not on file  Social History Narrative  . Not on file   Family History  Problem Relation Age of Onset  . Stroke Mother   . Diabetes Mother   . Heart failure Mother   . Cancer Father 1  . Drug abuse Sister   . Cancer Brother   . Alcohol abuse Sister   . Colon cancer Neg Hx   . Breast cancer Neg Hx     Objective: Office vital signs reviewed. BP 125/63 (BP Location: Left Arm)   Pulse (!) 56   Temp 97.9 F (36.6 C) (Oral)   Ht 5\' 3"  (1.6 m)   Wt 159 lb (72.1 kg)   BMI 28.17 kg/m   Physical Examination:  General: Awake, alert, well nourished, well appearing. No acute distress HEENT: Normal, sclera white, MMM Cardio: regular  rate and rhythm, S1S2 heard, no murmurs appreciated Pulm: clear to auscultation bilaterally, no wheezes, rhonchi or rales; normal work of breathing on room air Extremities: warm, well perfused,  No edema, cyanosis or clubbing; +2 pulses bilaterally MSK: normal gait and normal station Psych: Mood stable, speech normal, affect appropriate, pleasant, interactive Depression screen [facility] 2/9 11/02/2017 09/13/2017 08/31/2017  Decreased Interest 0 0 0  Down, Depressed, Hopeless 0 0 0  PHQ - 2 Score 0 0 0  Altered sleeping - - -  Tired, decreased energy - - -  Change in appetite - - -  Feeling bad or failure about yourself  - - -  Trouble concentrating - - -  Moving slowly or fidgety/restless - - -  Suicidal thoughts - - -  PHQ-9 Score - - -  Difficult doing work/chores - - -    Assessment/ Plan: 67 y.o. female   1. Sleep difficulties Possibly related to anxiety/depression but I am concerned about obstructive symptoms.  She appears to have slight micrognathia which may be contributing to obstructive sleep apnea.  We discussed consideration for sleep study.  There are oral appliances that may be able to help with this and she is concerned about having a mask on her face at night.  She would be amenable to this but would like to trial the Cymbalta first to see if things improve.  Will follow-up in 4 weeks and address this again.  2. Anxiety Start Cymbalta 30 mg daily.  3. Essential hypertension Blood pressure well controlled.  Continue current regimen.  Check BMP and lipid panel - Lipid Panel; Future - Basic Metabolic Panel; Future  4. Hyperlipidemia, unspecified hyperlipidemia type Using statin daily.  Check lipid panel.  She will come in fasting for these labs - Lipid Panel; Future  5. Pre-diabetes A1c was 5.7 at last visit.   - Lipid Panel; Future - Bayer DCA Hb A1c Waived; Future    Orders Placed This Encounter  Procedures  . Lipid Panel    Standing Status:   Future    Standing  Expiration Date:   11/03/2018  . Bayer DCA Hb A1c Waived    Standing Status:   Future    Standing Expiration Date:   11/03/2018  . Basic Metabolic Panel    Standing Status:   Future    Standing Expiration Date:   11/03/2018   Meds ordered this encounter  Medications  . DULoxetine (CYMBALTA) 30 MG capsule    Sig: Take 1 capsule (30 mg total) by mouth daily.    Dispense:  30 capsule    Refill:  [place], [place] [phone]

## 2017-11-05 ENCOUNTER — Other Ambulatory Visit: Payer: Medicare HMO

## 2017-11-05 DIAGNOSIS — E785 Hyperlipidemia, unspecified: Secondary | ICD-10-CM | POA: Diagnosis not present

## 2017-11-05 DIAGNOSIS — I10 Essential (primary) hypertension: Secondary | ICD-10-CM

## 2017-11-05 DIAGNOSIS — R7303 Prediabetes: Secondary | ICD-10-CM | POA: Diagnosis not present

## 2017-11-05 LAB — LIPID PANEL
Chol/HDL Ratio: 2.8 ratio (ref 0.0–4.4)
Cholesterol, Total: 171 mg/dL (ref 100–199)
HDL: 61 mg/dL (ref >39)
LDL Calculated: 92 mg/dL (ref 0–99)
Triglycerides: 89 mg/dL (ref 0–149)
VLDL Cholesterol Cal: 18 mg/dL (ref 5–40)

## 2017-11-05 LAB — BASIC METABOLIC PANEL
BUN/Creatinine Ratio: 18 (ref 12–28)
BUN: 15 mg/dL (ref 8–27)
CO2: 23 mmol/L (ref 20–29)
Calcium: 9.2 mg/dL (ref 8.7–10.3)
Chloride: 106 mmol/L (ref 96–106)
Creatinine, Ser: 0.84 mg/dL (ref 0.57–1.00)
GFR calc Af Amer: 84 mL/min/{1.73_m2} (ref >59)
GFR calc non Af Amer: 73 mL/min/{1.73_m2} (ref >59)
Glucose: 89 mg/dL (ref 65–99)
Potassium: 4.6 mmol/L (ref 3.5–5.2)
Sodium: 143 mmol/L (ref 134–144)

## 2017-11-05 LAB — BAYER DCA HB A1C WAIVED: HB A1C (BAYER DCA - WAIVED): 6 % (ref <7.0)

## 2017-11-15 ENCOUNTER — Ambulatory Visit (INDEPENDENT_AMBULATORY_CARE_PROVIDER_SITE_OTHER): Payer: Medicare HMO

## 2017-11-15 ENCOUNTER — Encounter: Payer: Self-pay | Admitting: Family

## 2017-11-15 ENCOUNTER — Ambulatory Visit (INDEPENDENT_AMBULATORY_CARE_PROVIDER_SITE_OTHER): Payer: Medicare HMO | Admitting: Family

## 2017-11-15 VITALS — BP 131/68 | HR 72 | Temp 98.5°F | Ht 63.0 in | Wt 158.6 lb

## 2017-11-15 DIAGNOSIS — B9689 Other specified bacterial agents as the cause of diseases classified elsewhere: Secondary | ICD-10-CM

## 2017-11-15 DIAGNOSIS — R05 Cough: Secondary | ICD-10-CM

## 2017-11-15 DIAGNOSIS — J208 Acute bronchitis due to other specified organisms: Secondary | ICD-10-CM

## 2017-11-15 DIAGNOSIS — R059 Cough, unspecified: Secondary | ICD-10-CM

## 2017-11-15 MED ORDER — DOXYCYCLINE HYCLATE 100 MG PO TABS: 100.0000 mg | ORAL_TABLET | Freq: Two times a day (BID) | ORAL | 0 refills | Status: DC

## 2017-11-15 MED ORDER — METHYLPREDNISOLONE 4 MG PO TBPK: ORAL_TABLET | ORAL | 0 refills | Status: DC

## 2017-11-15 NOTE — Progress Notes (Signed)
   Subjective:    Patient ID: [name] [name], female    DOB: 29-Aug-1950, 67 y.o.   MRN: [id number]  Chief Complaint  Patient presents with  . coughing up red ,fresh blood    began a week earlier  . Fever  . chest congestion  . right ear ache  . Sore Throat  . Sinus Problem    Cough  This is a new problem. The current episode started 1 to 4 weeks ago. The problem has been gradually worsening. The problem occurs every few minutes. The cough is productive of blood-tinged sputum and productive of bloody sputum. Associated symptoms include chills, ear pain, a fever, myalgias, nasal congestion, postnasal drip, a sore throat, shortness of breath and wheezing. Pertinent negatives include no headaches. Risk factors: quit in 2008. She has tried rest and OTC cough suppressant for the symptoms. The treatment provided mild relief.      Review of Systems  Constitutional: Positive for chills and fever.  HENT: Positive for ear pain, postnasal drip and sore throat.   Respiratory: Positive for cough, shortness of breath and wheezing.   Musculoskeletal: Positive for myalgias.  Neurological: Negative for headaches.  All other systems reviewed and are negative.      Objective:   Physical Exam  Constitutional: She is oriented to person, place, and time. She appears well-developed and well-nourished. No distress.  HENT:  Head: Normocephalic and atraumatic.  Right Ear: External ear normal.  Mouth/Throat: Oropharynx is clear and moist.  Eyes: Pupils are equal, round, and reactive to light.  Neck: Normal range of motion. Neck supple. No thyromegaly present.  Cardiovascular: Normal rate, regular rhythm, normal heart sounds and intact distal pulses.  No murmur heard. Pulmonary/Chest: Effort normal. No respiratory distress. She has wheezes. She has rhonchi.  Abdominal: Soft. Bowel sounds are normal. She exhibits no distension. There is no tenderness.  Musculoskeletal: Normal range of motion. She exhibits  no edema or tenderness.  Neurological: She is alert and oriented to person, place, and time. She has normal reflexes. No cranial nerve deficit.  Skin: Skin is warm and dry.  Psychiatric: She has a normal mood and affect. Her behavior is normal. Judgment and thought content normal.  Vitals reviewed.     BP 131/68   Pulse 72   Temp 98.5 F (36.9 C) (Oral)   Ht 5\' 3"  (1.6 m)   Wt 158 lb 9.6 oz (71.9 kg)   BMI 28.09 kg/m      Assessment & Plan:  [name] [name] comes in today with chief complaint of coughing up red ,fresh blood (began a week earlier); Fever; chest congestion; right ear ache; Sore Throat; and Sinus Problem   Diagnosis and orders addressed:  1. Cough - DG Chest 2 View; Future  2. Acute bacterial bronchitis - Take meds as prescribed - Use a cool mist humidifier  -Use saline nose sprays frequently -Force fluids -For any cough or congestion  Use plain Mucinex- regular strength or max strength is fine -For fever or aces or pains- take tylenol or ibuprofen. -Throat lozenges if help -New toothbrush in 3 days RTO if symptoms worsen or do not improve  - doxycycline (VIBRA-TABS) 100 MG tablet; Take 1 tablet (100 mg total) by mouth 2 (two) times daily.  Dispense: 20 tablet; Refill: 0 - methylPREDNISolone (MEDROL DOSEPAK) 4 MG TBPK tablet; Use as directed  Dispense: 21 tablet; Refill: 0   Evelina Dun, FNP

## 2017-11-15 NOTE — Patient Instructions (Signed)

## 2017-11-16 ENCOUNTER — Telehealth: Payer: Self-pay | Admitting: Family Medicine

## 2017-11-16 NOTE — Telephone Encounter (Signed)
Phone call to patient- she states her temperature is 99.1, advised this is not really considered a fever.  She states she has had 2 doses of the antibiotics prescribed yesterday, and is feeling slightly better, but still coughing.  Advised she will likely need more doses of the antibiotic before feeling significantly better.  Advised her to continue the antibiotics this weekend along with the other advice from [place], [place], and if no improvement next week call office back.  Patient agreeable.

## 2017-11-19 ENCOUNTER — Telehealth: Payer: Self-pay | Admitting: Family Medicine

## 2017-11-19 ENCOUNTER — Other Ambulatory Visit: Payer: Self-pay | Admitting: Family Medicine

## 2017-11-19 MED ORDER — ALBUTEROL SULFATE HFA 108 (90 BASE) MCG/ACT IN AERS: 2.0000 | INHALATION_SPRAY | Freq: Four times a day (QID) | RESPIRATORY_TRACT | 0 refills | Status: DC | PRN

## 2017-11-19 NOTE — Telephone Encounter (Signed)
Please review and advise.

## 2017-11-19 NOTE — Telephone Encounter (Signed)
I have refilled her albuterol.  Please have her schedule an appointment if she continues to feel poorly.

## 2017-11-19 NOTE — Telephone Encounter (Signed)
Left detailed message for pt regarding RX 

## 2017-11-19 NOTE — Telephone Encounter (Signed)
Pharmacy [facility] [place]  PT wants to know if we can send in some albuterol to the pharmacy to help open her up

## 2017-11-21 ENCOUNTER — Ambulatory Visit: Payer: Medicare HMO | Admitting: Family Medicine

## 2017-11-26 ENCOUNTER — Telehealth: Payer: Self-pay | Admitting: Family Medicine

## 2017-11-26 NOTE — Telephone Encounter (Signed)
Ok to use OTC meclizine for dizziness.

## 2017-11-26 NOTE — Telephone Encounter (Signed)
Patient aware.

## 2017-11-26 NOTE — Telephone Encounter (Signed)
Pt called - she thinks she is having a reaction to cymbalta :  She is coming in tomorrow to discuss this and some sinus issues and dizziness, BUT would really like something called in for dizziness tonight.  [facility] [place]

## 2017-11-27 ENCOUNTER — Ambulatory Visit (INDEPENDENT_AMBULATORY_CARE_PROVIDER_SITE_OTHER): Payer: Medicare HMO | Admitting: Family Medicine

## 2017-11-27 ENCOUNTER — Other Ambulatory Visit: Payer: Self-pay | Admitting: Family Medicine

## 2017-11-27 VITALS — BP 134/63 | HR 55 | Temp 97.3°F | Ht 63.0 in | Wt 160.0 lb

## 2017-11-27 DIAGNOSIS — F419 Anxiety disorder, unspecified: Secondary | ICD-10-CM

## 2017-11-27 DIAGNOSIS — R69 Illness, unspecified: Secondary | ICD-10-CM | POA: Diagnosis not present

## 2017-11-27 DIAGNOSIS — F321 Major depressive disorder, single episode, moderate: Secondary | ICD-10-CM | POA: Diagnosis not present

## 2017-11-27 NOTE — Progress Notes (Signed)
Subjective: CC: Anxiety depression PCP: Janora Norlander, DO HPI:[name] [name] is a 67 y.o. female presenting to clinic today for:  1. GAD Patient is here for interval checkup on anxiety and depression.  She reports that she felt a great deal of improvement within the first several days of initiation of Cymbalta.  She describes improvement in mood, energy and pain.  She continues to have intermittent episodes where she wakes up in the middle the night but feels that she is easily able to get back to sleep now.  She has not taken it for the last 2 days because she has been dizzy.  She wonders if this is an adverse reaction from the medication versus related to recent illness.  She was recently seen in the office for an acute pulmonary infection.  She notes that she had been on the Cymbalta for 6 days prior to becoming ill and had been doing well.  She was treated with steroids, albuterol and doxycycline.  She is completed the antibiotic and steroids and feels that that seems to be getting better.  ROS: Per HPI  Allergies  Allergen Reactions  . Amlodipine Swelling  . Cetirizine & Related Other (See Comments)    Extreme Drowsiness - "knocks me loopy for 2 days"  . Latex Rash  . Levofloxacin Nausea And Vomiting  . Lisinopril Other (See Comments)    Excessive mucus production  . Prednisone Other (See Comments)    High dose - hallucinations  . Tape Rash   Past Medical History:  Diagnosis Date  . Anxiety   . CAD (coronary artery disease)    LM/circumflex disease s/p LIMA to LAD and SVG to OM2 2013  . Depression   . Essential hypertension   . GERD (gastroesophageal reflux disease)   . Hemorrhoids   . IBS (irritable bowel syndrome)   . Renal cyst, left     Current Outpatient Medications:  .  albuterol (PROVENTIL HFA;VENTOLIN HFA) 108 (90 Base) MCG/ACT inhaler, Inhale 2 puffs into the lungs every 6 (six) hours as needed for wheezing or shortness of breath., Disp: 1 Inhaler, Rfl: 0 .   aspirin 81 MG tablet, Take 81 mg by mouth daily., Disp: , Rfl:  .  Aspirin-Acetaminophen-Caffeine (EXCEDRIN PO), Take by mouth., Disp: , Rfl:  .  atorvastatin (LIPITOR) 20 MG tablet, Take 20 mg by mouth every other day., Disp: , Rfl:  .  famotidine (PEPCID) 20 MG tablet, Take 1 tablet (20 mg total) by mouth 2 (two) times daily., Disp: 20 tablet, Rfl: 0 .  pantoprazole (PROTONIX) 40 MG tablet, Take 40 mg by mouth daily as needed., Disp: , Rfl:  .  spironolactone (ALDACTONE) 25 MG tablet, Take 0.5 tablets (12.5 mg total) by mouth daily., Disp: 45 tablet, Rfl: 1 .  DULoxetine (CYMBALTA) 30 MG capsule, Take 1 capsule (30 mg total) by mouth daily. (Patient not taking: Reported on 11/27/2017), Disp: 30 capsule, Rfl: 1 .  nitroGLYCERIN (NITROSTAT) 0.4 MG SL tablet, Place 1 tablet (0.4 mg total) under the tongue every 5 (five) minutes as needed for chest pain., Disp: 75 tablet, Rfl: 1 .  traMADol (ULTRAM) 50 MG tablet, Take 1 tablet (50 mg total) by mouth every 12 (twelve) hours as needed. (Patient not taking: Reported on 11/27/2017), Disp: 30 tablet, Rfl: 0   Social History   Socioeconomic History  . Marital status: Divorced    Spouse name: Not on file  . Number of children: Not on file  . Years  of education: Not on file  . Highest education level: Not on file  Occupational History  . Not on file  Social Needs  . Financial resource strain: Not on file  . Food insecurity:    Worry: Not on file    Inability: Not on file  . Transportation needs:    Medical: Not on file    Non-medical: Not on file  Tobacco Use  . Smoking status: Former Smoker    Types: Cigarettes    Last attempt to quit: 05/23/2006    Years since quitting: 11.5  . Smokeless tobacco: Never Used  Substance and Sexual Activity  . Alcohol use: Yes    Alcohol/week: 0.0 standard drinks    Comment: rare glass of wine  . Drug use: No  . Sexual activity: Not Currently    Birth control/protection: Post-menopausal  Lifestyle  .  Physical activity:    Days per week: Not on file    Minutes per session: Not on file  . Stress: Not on file  Relationships  . Social connections:    Talks on phone: Not on file    Gets together: Not on file    Attends religious service: Not on file    Active member of club or organization: Not on file    Attends meetings of clubs or organizations: Not on file    Relationship status: Not on file  . Intimate partner violence:    Fear of current or ex partner: Not on file    Emotionally abused: Not on file    Physically abused: Not on file    Forced sexual activity: Not on file  Other Topics Concern  . Not on file  Social History Narrative  . Not on file   Family History  Problem Relation Age of Onset  . Stroke Mother   . Diabetes Mother   . Heart failure Mother   . Cancer Father 50  . Drug abuse Sister   . Cancer Brother   . Alcohol abuse Sister   . Colon cancer Neg Hx   . Breast cancer Neg Hx     Objective: Office vital signs reviewed. BP (!) 143/75   Pulse (!) 55   Temp (!) 97.3 F (36.3 C) (Oral)   Ht 5\' 3"  (1.6 m)   Wt 160 lb (72.6 kg)   BMI 28.34 kg/m   Physical Examination:  General: Awake, alert, well nourished.  No acute distress HEENT: Normal, sclera white, MMM Cardio: regular rate and rhythm, S1S2 heard, no murmurs appreciated Pulm: clear to auscultation bilaterally, no wheezes, rhonchi or rales; normal work of breathing on room air Psych: Mood stable, speech normal, affect appropriate, pleasant, interactive Depression screen [facility] 2/9 11/27/2017 11/15/2017 11/02/2017  Decreased Interest 0 0 0  Down, Depressed, Hopeless 0 0 0  PHQ - 2 Score 0 0 0  Altered sleeping - - -  Tired, decreased energy - - -  Change in appetite - - -  Feeling bad or failure about yourself  - - -  Trouble concentrating - - -  Moving slowly or fidgety/restless - - -  Suicidal thoughts - - -  PHQ-9 Score - - -  Difficult doing work/chores - - -   GAD 7 : Generalized Anxiety  Score 11/27/2017 08/31/2017 02/22/2017 01/17/2017  Nervous, Anxious, on Edge 0 0 1 3  Control/stop worrying 0 0 0 2  Worry too much - different things 0 0 0 1  Trouble relaxing 0 1 1 1  Restless 0 0 0 0  Easily annoyed or irritable 0 0 0 1  Afraid - awful might happen 0 0 0 2  Total GAD 7 Score 0 1 2 10   Anxiety Difficulty Not difficult at all Not difficult at all - Not difficult at all    Assessment/ Plan: 67 y.o. female   1. Anxiety I discussed with the patient that I do not think the Cymbalta is the cause of her dizzy spells.  The dizziness is more likely related to her recent URI.  I advised her to resume use of the Cymbalta, since it was working so well for her.  She will resume use of medication and follow-up in 3 months, sooner if needed.  2. Current moderate episode of major depressive disorder without prior episode ([place]) As above.   Janora Norlander, DO [place] [phone]

## 2017-11-29 ENCOUNTER — Observation Stay (HOSPITAL_COMMUNITY)
Admission: EM | Admit: 2017-11-29 | Discharge: 2017-11-30 | Disposition: A | Discharge status: Home / Self Care | Payer: Medicare HMO | Attending: Internal Medicine | Admitting: Internal Medicine

## 2017-11-29 ENCOUNTER — Encounter (HOSPITAL_COMMUNITY): Payer: Self-pay

## 2017-11-29 ENCOUNTER — Other Ambulatory Visit: Payer: Self-pay | Admitting: Family Medicine

## 2017-11-29 ENCOUNTER — Telehealth: Payer: Self-pay | Admitting: Family Medicine

## 2017-11-29 ENCOUNTER — Other Ambulatory Visit: Payer: Self-pay

## 2017-11-29 DIAGNOSIS — E785 Hyperlipidemia, unspecified: Secondary | ICD-10-CM | POA: Diagnosis not present

## 2017-11-29 DIAGNOSIS — Z951 Presence of aortocoronary bypass graft: Secondary | ICD-10-CM

## 2017-11-29 DIAGNOSIS — Z91040 Latex allergy status: Secondary | ICD-10-CM | POA: Insufficient documentation

## 2017-11-29 DIAGNOSIS — Z7982 Long term (current) use of aspirin: Secondary | ICD-10-CM | POA: Insufficient documentation

## 2017-11-29 DIAGNOSIS — Z79899 Other long term (current) drug therapy: Secondary | ICD-10-CM | POA: Diagnosis not present

## 2017-11-29 DIAGNOSIS — I2510 Atherosclerotic heart disease of native coronary artery without angina pectoris: Secondary | ICD-10-CM | POA: Insufficient documentation

## 2017-11-29 DIAGNOSIS — Z87891 Personal history of nicotine dependence: Secondary | ICD-10-CM | POA: Diagnosis not present

## 2017-11-29 DIAGNOSIS — R1084 Generalized abdominal pain: Secondary | ICD-10-CM | POA: Diagnosis not present

## 2017-11-29 DIAGNOSIS — R42 Dizziness and giddiness: Secondary | ICD-10-CM

## 2017-11-29 DIAGNOSIS — I10 Essential (primary) hypertension: Secondary | ICD-10-CM | POA: Diagnosis not present

## 2017-11-29 DIAGNOSIS — R0689 Other abnormalities of breathing: Secondary | ICD-10-CM | POA: Diagnosis not present

## 2017-11-29 MED ORDER — ONDANSETRON HCL 4 MG/2ML IJ SOLN
4.0000 mg | Freq: Once | INTRAMUSCULAR | Status: AC
  Administered 2017-11-29: 4 mg via INTRAVENOUS

## 2017-11-29 MED ORDER — SODIUM CHLORIDE 0.9 % IV BOLUS
1000.0000 mL | Freq: Once | INTRAVENOUS | Status: AC
  Administered 2017-11-29: 1000 mL via INTRAVENOUS

## 2017-11-29 MED ORDER — ONDANSETRON HCL 4 MG/2ML IJ SOLN
INTRAMUSCULAR | Status: AC
  Administered 2017-11-29: 4 mg via INTRAVENOUS
  Filled 2017-11-29: qty 2

## 2017-11-29 MED ORDER — MECLIZINE HCL 12.5 MG PO TABS
25.0000 mg | ORAL_TABLET | Freq: Once | ORAL | Status: AC
  Administered 2017-11-29: 25 mg via ORAL
  Filled 2017-11-29: qty 2

## 2017-11-29 NOTE — ED Provider Notes (Signed)
[facility] EMERGENCY DEPARTMENT Provider Note   CSN: [id number] Arrival date & time: 11/29/17  2326     History   Chief Complaint Chief Complaint  Patient presents with  . Abdominal Pain    HPI [name] [name] is a 67 y.o. female.  The history is provided by the patient.  She has history of hypertension, hyperlipidemia, coronary artery disease status post coronary bypass and comes in with dizziness and nausea and vomiting over the last 3 days.  She has had a respiratory infection over approximately the last 2-3 weeks and received a course of antibiotics and steroids but symptoms never fully cleared.  Dizziness is described as a spinning sensation and is worse with certain movements.  She had called her PCP who recommended that she take meclizine, but she is not obtained any meclizine and try taking diphenhydramine without any benefit.  She denies headache, tinnitus, ear pain, hearing loss.  She has had one prior episode of vertigo, but it was very self-limited.  Current symptoms are severe.  Past Medical History:  Diagnosis Date  . Anxiety   . CAD (coronary artery disease)    LM/circumflex disease s/p LIMA to LAD and SVG to OM2 2013  . Depression   . Essential hypertension   . GERD (gastroesophageal reflux disease)   . Hemorrhoids   . IBS (irritable bowel syndrome)   . Renal cyst, left     Patient Active Problem List   Diagnosis Date Noted  . Pre-diabetes 11/02/2017  . Sleep difficulties 11/02/2017  . Bleeding hemorrhoid 08/01/2017  . Gastroesophageal reflux disease without esophagitis 08/01/2017  . Atrophic vaginitis 08/26/2015  . BMI 29.0-29.9,adult 04/08/2015  . Essential hypertension 11/21/2014  . Hyperlipidemia LDL goal <70 11/21/2014  . Bradycardia 10/10/2013  . Chest pain with moderate risk of acute coronary syndrome 11/20/2012  . S/P CABG x 2 06/19/2011  . Unstable angina ([place]) 05/23/2011  . Anxiety 05/23/2011  . Depression 05/23/2011  . Renal cyst 05/23/2011   . History of smoking, quit 2008 05/23/2011    Past Surgical History:  Procedure Laterality Date  . ABDOMINAL HYSTERECTOMY  2003  . CORONARY ARTERY BYPASS GRAFT  05/26/2011   Procedure: CORONARY ARTERY BYPASS GRAFTING (CABG);  Surgeon: Ivin Poot, MD;  Location: [place];  Service: Open Heart Surgery;  Laterality: N/A;  Coronary Artery Bypass Graft on Pump times two utlizing left internal mammary artery and right greater saphenous vein harvested endoscopically   . LEFT HEART CATHETERIZATION WITH CORONARY ANGIOGRAM N/A 05/24/2011   Procedure: LEFT HEART CATHETERIZATION WITH CORONARY ANGIOGRAM;  Surgeon: Leonie Man, MD;  Location: [facility] CATH LAB;  Service: Cardiovascular;  Laterality: N/A;  . TONSILLECTOMY  1962  . TUBAL LIGATION       OB History   None      Home Medications    Prior to Admission medications   Medication Sig Start Date End Date Taking? Authorizing Provider  albuterol (PROVENTIL HFA;VENTOLIN HFA) 108 (90 Base) MCG/ACT inhaler Inhale 2 puffs into the lungs every 6 (six) hours as needed for wheezing or shortness of breath. 11/19/17   Janora Norlander, DO  aspirin 81 MG tablet Take 81 mg by mouth daily.    [provider]  Aspirin-Acetaminophen-Caffeine (EXCEDRIN PO) Take by mouth.    [provider]  atorvastatin (LIPITOR) 20 MG tablet Take 20 mg by mouth every other day.    [provider]  DULoxetine (CYMBALTA) 30 MG capsule TAKE 1 CAPSULE BY MOUTH EVERY DAY  11/28/17   Janora Norlander, DO  famotidine (PEPCID) 20 MG tablet Take 1 tablet (20 mg total) by mouth 2 (two) times daily. 08/18/17   Evalee Jefferson, PA-C  nitroGLYCERIN (NITROSTAT) 0.4 MG SL tablet Place 1 tablet (0.4 mg total) under the tongue every 5 (five) minutes as needed for chest pain. 04/03/17 10/19/17  Troy Sine, MD  pantoprazole (PROTONIX) 40 MG tablet Take 40 mg by mouth daily as needed.    [provider]  spironolactone (ALDACTONE) 25 MG tablet Take 0.5 tablets  (12.5 mg total) by mouth daily. 08/20/17   Janora Norlander, DO  traMADol (ULTRAM) 50 MG tablet Take 1 tablet (50 mg total) by mouth every 12 (twelve) hours as needed. Patient not taking: Reported on 11/27/2017 09/13/17   Sharion Balloon, FNP    Family History Family History  Problem Relation Age of Onset  . Stroke Mother   . Diabetes Mother   . Heart failure Mother   . Cancer Father 25  . Drug abuse Sister   . Cancer Brother   . Alcohol abuse Sister   . Colon cancer Neg Hx   . Breast cancer Neg Hx     Social History Social History   Tobacco Use  . Smoking status: Former Smoker    Types: Cigarettes    Last attempt to quit: 05/23/2006    Years since quitting: 11.5  . Smokeless tobacco: Never Used  Substance Use Topics  . Alcohol use: Yes    Alcohol/week: 0.0 standard drinks    Comment: rare glass of wine  . Drug use: No     Allergies   Amlodipine; Cetirizine & related; Latex; Levofloxacin; Lisinopril; Prednisone; and Tape   Review of Systems Review of Systems  All other systems reviewed and are negative.    Physical Exam Updated Vital Signs BP (!) 176/73 (BP Location: Left Arm)   Pulse 62   Temp 97.8 F (36.6 C) (Oral)   Resp 18   Ht 5\' 3"  (1.6 m)   Wt 72.6 kg   SpO2 95%   BMI 28.34 kg/m   Physical Exam  Nursing note and vitals reviewed.  67 year old female, resting comfortably and in no acute distress. Vital signs are significant for hypertension. Oxygen saturation is 95%, which is normal. Head is normocephalic and atraumatic. PERRLA, EOMI. Rotary nystagmus is noted on upward gaze and right lateral gaze.  Oropharynx is clear. Neck is nontender and supple without adenopathy or JVD. Back is nontender and there is no CVA tenderness. Lungs are clear without rales, wheezes, or rhonchi. Chest is nontender. Heart has regular rate and rhythm without murmur. Abdomen is soft, flat, nontender without masses or hepatosplenomegaly and peristalsis is  normoactive. Extremities have no cyanosis or edema, full range of motion is present. Skin is warm and dry without rash. Neurologic: Mental status is normal, cranial nerves are intact, there are no motor or sensory deficits.  Dizziness is reproduced by passive head movement.  ED Treatments / Results  Labs (all labs ordered are listed, but only abnormal results are displayed) Labs Reviewed  BASIC METABOLIC PANEL - Abnormal; Notable for the following components:      Result Value   Glucose, Bld 104 (*)    Calcium 8.8 (*)    All other components within normal limits  CBC WITH DIFFERENTIAL/PLATELET - Abnormal; Notable for the following components:   WBC 10.7 (*)    All other components within normal limits    EKG  EKG Interpretation  Date/Time:  Thursday November 29 2017 23:51:56 EDT Ventricular Rate:  59 PR Interval:    QRS Duration: 98 QT Interval:  424 QTC Calculation: 420 R Axis:   72 Text Interpretation:  Sinus rhythm Nonspecific T abnormalities, lateral leads Artifact in lead(s) I II III aVR aVL aVF When compared with ECG of 04/02/2017, No significant change was found Confirmed by Delora Fuel ([id number]) on 11/30/2017 12:16:53 AM  Procedures Procedures   Medications Ordered in ED Medications  ondansetron [facility]) injection 4 mg (4 mg Intravenous Given 11/29/17 2339)  sodium chloride 0.9 % bolus 1,000 mL (0 mLs Intravenous Stopped 11/30/17 0342)  meclizine (ANTIVERT) tablet 25 mg (25 mg Oral Given 11/29/17 2354)  LORazepam (ATIVAN) injection 1 mg (1 mg Intravenous Given 11/30/17 0126)  metoCLOPramide (REGLAN) injection 10 mg (10 mg Intravenous Given 11/30/17 0338)  diphenhydrAMINE (BENADRYL) injection 25 mg (25 mg Intravenous Given 11/30/17 0339)     Initial Impression / Assessment and Plan / ED Course  I have reviewed the triage vital signs and the nursing notes.  Pertinent labs & imaging results that were available during my care of the patient were reviewed by me and  considered in my medical decision making (see chart for details).  Dizziness which clinically appears to be peripheral vertigo.  No red flags to suggest more serious pathology.  All records are reviewed, and she was seen at her primary care provider's office on October 3, diagnosed with bacterial bronchitis, discharged with prescriptions for doxycycline and methylprednisolone.  Dizziness mentioned at office visit 2 days ago, no treatment initiated.  Phone conversation earlier today with primary care provider who recommended using meclizine.  ED visit for vertigo and August 2013.  She had very little relief with above-noted treatment.  She is given a dose of lorazepam with slight improvement.  However, when attempting to ambulate, she was very unsteady and continued to have nausea.  She was given metoclopramide which is only partly improved the nausea.  It is felt she is not safe to go home.  I feel she should have an MRI to rule out posterior circulation stroke.  Case is discussed with Dr. Darrick Meigs of [facility], who agrees to admit the patient under observation status.  Final Clinical Impressions(s) / ED Diagnoses   Final diagnoses:  Vertigo    ED Discharge Orders    None       Delora Fuel, MD 60/45/40 [phone]

## 2017-11-29 NOTE — ED Triage Notes (Signed)
Woke up with abd pain, states vertigo, reports taking doxy because she was 'coughing up blood and pus', reports pain is burning upper abd.

## 2017-11-29 NOTE — ED Notes (Signed)
Pt is actively vomiting during triage

## 2017-11-29 NOTE — Telephone Encounter (Signed)
Patient is requesting referral to ENT for continued dizziness

## 2017-11-30 ENCOUNTER — Observation Stay (HOSPITAL_COMMUNITY): Payer: Medicare HMO

## 2017-11-30 DIAGNOSIS — E785 Hyperlipidemia, unspecified: Secondary | ICD-10-CM

## 2017-11-30 DIAGNOSIS — R42 Dizziness and giddiness: Secondary | ICD-10-CM | POA: Diagnosis not present

## 2017-11-30 DIAGNOSIS — Z951 Presence of aortocoronary bypass graft: Secondary | ICD-10-CM

## 2017-11-30 DIAGNOSIS — I10 Essential (primary) hypertension: Secondary | ICD-10-CM

## 2017-11-30 LAB — BASIC METABOLIC PANEL
Anion gap: 6 (ref 5–15)
BUN: 22 mg/dL (ref 8–23)
CO2: 28 mmol/L (ref 22–32)
Calcium: 8.8 mg/dL — ABNORMAL LOW (ref 8.9–10.3)
Chloride: 105 mmol/L (ref 98–111)
Creatinine, Ser: 0.75 mg/dL (ref 0.44–1.00)
GFR calc Af Amer: >60 mL/min (ref >60)
GFR calc non Af Amer: >60 mL/min (ref >60)
Glucose, Bld: 104 mg/dL — ABNORMAL HIGH (ref 70–99)
Potassium: 3.8 mmol/L (ref 3.5–5.1)
Sodium: 139 mmol/L (ref 135–145)

## 2017-11-30 LAB — CBC WITH DIFFERENTIAL/PLATELET
Abs Immature Granulocytes: 0.06 10*3/uL (ref 0.00–0.07)
Basophils Absolute: 0.1 10*3/uL (ref 0.0–0.1)
Basophils Relative: 1 %
Eosinophils Absolute: 0.3 10*3/uL (ref 0.0–0.5)
Eosinophils Relative: 2 %
HCT: 41.2 % (ref 36.0–46.0)
Hemoglobin: 13.2 g/dL (ref 12.0–15.0)
Immature Granulocytes: 1 %
Lymphocytes Relative: 30 %
Lymphs Abs: 3.2 10*3/uL (ref 0.7–4.0)
MCH: 28.5 pg (ref 26.0–34.0)
MCHC: 32 g/dL (ref 30.0–36.0)
MCV: 89 fL (ref 80.0–100.0)
Monocytes Absolute: 0.8 10*3/uL (ref 0.1–1.0)
Monocytes Relative: 7 %
Neutro Abs: 6.3 10*3/uL (ref 1.7–7.7)
Neutrophils Relative %: 59 %
Platelets: 388 10*3/uL (ref 150–400)
RBC: 4.63 MIL/uL (ref 3.87–5.11)
RDW: 13.5 % (ref 11.5–15.5)
WBC: 10.7 10*3/uL — ABNORMAL HIGH (ref 4.0–10.5)
nRBC: 0 % (ref 0.0–0.2)

## 2017-11-30 LAB — TROPONIN I: Troponin I: <0.03 ng/mL (ref <0.03)

## 2017-11-30 MED ORDER — DULOXETINE HCL 30 MG PO CPEP
30.0000 mg | ORAL_CAPSULE | Freq: Every day | ORAL | Status: DC
  Administered 2017-11-30: 30 mg via ORAL
  Filled 2017-11-30: qty 1

## 2017-11-30 MED ORDER — DIPHENHYDRAMINE HCL 50 MG/ML IJ SOLN
25.0000 mg | Freq: Once | INTRAMUSCULAR | Status: AC
  Administered 2017-11-30: 25 mg via INTRAVENOUS
  Filled 2017-11-30: qty 1

## 2017-11-30 MED ORDER — LORAZEPAM 2 MG/ML IJ SOLN
1.0000 mg | Freq: Once | INTRAMUSCULAR | Status: AC
  Administered 2017-11-30: 1 mg via INTRAVENOUS
  Filled 2017-11-30: qty 1

## 2017-11-30 MED ORDER — ASPIRIN EC 81 MG PO TBEC
81.0000 mg | DELAYED_RELEASE_TABLET | Freq: Every day | ORAL | Status: DC
  Administered 2017-11-30: 81 mg via ORAL
  Filled 2017-11-30: qty 1

## 2017-11-30 MED ORDER — ATORVASTATIN CALCIUM 20 MG PO TABS
20.0000 mg | ORAL_TABLET | ORAL | Status: DC
  Administered 2017-11-30: 20 mg via ORAL
  Filled 2017-11-30: qty 1

## 2017-11-30 MED ORDER — SODIUM CHLORIDE 0.9 % IV SOLN: 250.0000 mL | INTRAVENOUS | Status: DC | PRN

## 2017-11-30 MED ORDER — FAMOTIDINE 20 MG PO TABS
20.0000 mg | ORAL_TABLET | Freq: Two times a day (BID) | ORAL | Status: DC
  Administered 2017-11-30: 20 mg via ORAL
  Filled 2017-11-30: qty 1

## 2017-11-30 MED ORDER — SODIUM CHLORIDE 0.9% FLUSH
3.0000 mL | Freq: Two times a day (BID) | INTRAVENOUS | Status: DC
  Administered 2017-11-30: 3 mL via INTRAVENOUS

## 2017-11-30 MED ORDER — ENOXAPARIN SODIUM 40 MG/0.4ML ~~LOC~~ SOLN
40.0000 mg | SUBCUTANEOUS | Status: DC
  Filled 2017-11-30: qty 0.4

## 2017-11-30 MED ORDER — ONDANSETRON HCL 4 MG/2ML IJ SOLN: 4.0000 mg | Freq: Four times a day (QID) | INTRAMUSCULAR | Status: DC | PRN

## 2017-11-30 MED ORDER — ONDANSETRON HCL 4 MG PO TABS: 4.0000 mg | ORAL_TABLET | Freq: Four times a day (QID) | ORAL | Status: DC | PRN

## 2017-11-30 MED ORDER — METOCLOPRAMIDE HCL 5 MG/ML IJ SOLN
10.0000 mg | Freq: Once | INTRAMUSCULAR | Status: AC
  Administered 2017-11-30: 10 mg via INTRAVENOUS
  Filled 2017-11-30: qty 2

## 2017-11-30 MED ORDER — ONDANSETRON 4 MG PO TBDP: 4.0000 mg | ORAL_TABLET | Freq: Three times a day (TID) | ORAL | 0 refills | Status: DC | PRN

## 2017-11-30 MED ORDER — ALBUTEROL SULFATE (2.5 MG/3ML) 0.083% IN NEBU: 3.0000 mL | INHALATION_SOLUTION | Freq: Four times a day (QID) | RESPIRATORY_TRACT | Status: DC | PRN

## 2017-11-30 MED ORDER — SODIUM CHLORIDE 0.9% FLUSH: 3.0000 mL | INTRAVENOUS | Status: DC | PRN

## 2017-11-30 MED ORDER — MECLIZINE HCL 12.5 MG PO TABS
25.0000 mg | ORAL_TABLET | Freq: Three times a day (TID) | ORAL | Status: DC
  Administered 2017-11-30: 25 mg via ORAL
  Filled 2017-11-30: qty 2

## 2017-11-30 MED ORDER — MECLIZINE HCL 12.5 MG PO TABS: 25.0000 mg | ORAL_TABLET | Freq: Three times a day (TID) | ORAL | Status: DC | PRN

## 2017-11-30 MED ORDER — MECLIZINE HCL 25 MG PO TABS: 25.0000 mg | ORAL_TABLET | Freq: Three times a day (TID) | ORAL | 0 refills | Status: DC

## 2017-11-30 NOTE — Discharge Summary (Signed)
Physician Discharge Summary  [name] [name] ZOX:[id number] DOB: 03/02/1950 DOA: 11/29/2017  PCP: Janora Norlander, DO  Admit date: 11/29/2017 Discharge date: 11/30/2017  Admitted From: Home Disposition: Home  Recommendations for Outpatient Follow-up:  1. Follow up with PCP in 1-2 weeks 2. Please obtain BMP/CBC in one week  Home Health: Outpatient physical therapy Equipment/Devices:  Discharge Condition:stable CODE STATUS:full code Diet recommendation: heart healthy  Brief/Interim Summary: 67 year old female with history of coronary artery disease status post CABG, hypertension, hyperlipidemia, presents to the hospital with complaints of dizziness.  She reported significant dizziness, nausea and vomiting, which is worse with certain head movements.  She underwent MRI of the brain that did not show any acute findings.  She was treated supportively with Antivert, benzodiazepines and Reglan.  Her overall symptoms significantly improved.  She reported a recent upper respiratory tract infection likely had peripheral vertigo.  She was seen by physical therapy who recommended possible outpatient vestibular therapy.  The patient is feeling significantly better.  The remainder of her medical problems are stable.  She is felt stable for discharge home today.  Discharge Diagnoses:  Principal Problem:   Vertigo Active Problems:   S/P CABG x 2   Essential hypertension   Hyperlipidemia LDL goal <70    Discharge Instructions  Discharge Instructions    Ambulatory referral to Physical Therapy   Complete by:  As directed    Diet - low sodium heart healthy   Complete by:  As directed    Increase activity slowly   Complete by:  As directed      Allergies as of 11/30/2017      Reactions   Amlodipine Swelling   Cetirizine & Related Other (See Comments)   Extreme Drowsiness - "knocks me loopy for 2 days"   Latex Rash   Levofloxacin Nausea And Vomiting   Lisinopril Other (See Comments)   Excessive mucus production   Prednisone Other (See Comments)   High dose - hallucinations   Tape Rash      Medication List    STOP taking these medications   EXCEDRIN PO   traMADol 50 MG tablet Commonly known as:  ULTRAM     TAKE these medications   albuterol 108 (90 Base) MCG/ACT inhaler Commonly known as:  PROVENTIL HFA;VENTOLIN HFA Inhale 2 puffs into the lungs every 6 (six) hours as needed for wheezing or shortness of breath.   aspirin 81 MG tablet Take 81 mg by mouth daily.   atorvastatin 20 MG tablet Commonly known as:  LIPITOR Take 20 mg by mouth every other day.   DULoxetine 30 MG capsule Commonly known as:  CYMBALTA TAKE 1 CAPSULE BY MOUTH EVERY DAY   famotidine 20 MG tablet Commonly known as:  PEPCID Take 1 tablet (20 mg total) by mouth 2 (two) times daily.   meclizine 25 MG tablet Commonly known as:  ANTIVERT Take 1 tablet (25 mg total) by mouth 3 (three) times daily.   nitroGLYCERIN 0.4 MG SL tablet Commonly known as:  NITROSTAT Place 1 tablet (0.4 mg total) under the tongue every 5 (five) minutes as needed for chest pain.   ondansetron 4 MG disintegrating tablet Commonly known as:  ZOFRAN-ODT Take 1 tablet (4 mg total) by mouth every 8 (eight) hours as needed for nausea or vomiting.   pantoprazole 40 MG tablet Commonly known as:  PROTONIX Take 40 mg by mouth daily as needed.   spironolactone 25 MG tablet Commonly known as:  ALDACTONE Take 0.5 tablets (  12.5 mg total) by mouth daily.       Allergies  Allergen Reactions  . Amlodipine Swelling  . Cetirizine & Related Other (See Comments)    Extreme Drowsiness - "knocks me loopy for 2 days"  . Latex Rash  . Levofloxacin Nausea And Vomiting  . Lisinopril Other (See Comments)    Excessive mucus production  . Prednisone Other (See Comments)    High dose - hallucinations  . Tape Rash    Consultations:     Procedures/Studies: Dg Chest 2 View  Result Date: 11/16/2017 CLINICAL DATA:   Cough. EXAM: CHEST - 2 VIEW COMPARISON:  Chest x-ray 03/01/2017. FINDINGS: Mediastinum and hilar structures normal. Lungs are clear. No pleural effusion or pneumothorax. Prior CABG. Heart size stable. No pulmonary venous congestion. Degenerative change and scoliosis thoracic spine. IMPRESSION: 1.  Prior CABG.  Heart size normal. 2.  No acute pulmonary disease. Electronically Signed   By: Marcello Moores  Register   On: 11/16/2017 07:03   Mr Brain Wo Contrast  Result Date: 11/30/2017 CLINICAL DATA:  Vertigo. EXAM: MRI HEAD WITHOUT CONTRAST TECHNIQUE: Multiplanar, multiecho pulse sequences of the brain and surrounding structures were obtained without intravenous contrast. COMPARISON:  Head CT 09/27/2011 FINDINGS: Brain: There is no evidence of acute infarct, intracranial hemorrhage, mass, midline shift, or extra-axial fluid collection. Mild cerebral atrophy is within normal limits for age. Scattered small foci of T2 hyperintensity in the cerebral white matter nonspecific but compatible with mild chronic small vessel ischemic disease. The cerebellum is unremarkable. Vascular: Major intracranial vascular flow voids are preserved. Skull and upper cervical spine: Unremarkable bone marrow signal. Sinuses/Orbits: Unremarkable orbits. Minimal bilateral maxillary and ethmoid sinus mucosal thickening. Clear mastoid air cells. Other: None. IMPRESSION: 1. No acute intracranial abnormality. 2. Mild chronic small vessel ischemic disease. Electronically Signed   By: Logan Bores M.D.   On: 11/30/2017 10:49      Subjective: Feeling better.  Dizziness and nausea have significantly improved  Discharge Exam: Vitals:   11/30/17 0700 11/30/17 0830 11/30/17 0921 11/30/17 1426  BP: 109/60 120/66 (!) 119/57 126/66  Pulse: (!) 56 60 (!) 51 (!) 57  Resp:   20 18  Temp:   97.7 F (36.5 C)   TempSrc:   Oral   SpO2: 96% 96% 97% 95%  Weight:   72.5 kg   Height:   5\' 3"  (1.6 m)     General: Pt is alert, awake, not in acute  distress Cardiovascular: RRR, S1/S2 +, no rubs, no gallops Respiratory: CTA bilaterally, no wheezing, no rhonchi Abdominal: Soft, NT, ND, bowel sounds + Extremities: no edema, no cyanosis    The results of significant diagnostics from this hospitalization (including imaging, microbiology, ancillary and laboratory) are listed below for reference.     Microbiology: No results found for this or any previous visit (from the past 240 hour(s)).   Labs: BNP (last 3 results) No results for input(s): BNP in the last 8760 hours. Basic Metabolic Panel: Recent Labs  Lab 11/30/17 0017  NA 139  K 3.8  CL 105  CO2 28  GLUCOSE 104*  BUN 22  CREATININE 0.75  CALCIUM 8.8*   Liver Function Tests: No results for input(s): AST, ALT, ALKPHOS, BILITOT, PROT, ALBUMIN in the last 168 hours. No results for input(s): LIPASE, AMYLASE in the last 168 hours. No results for input(s): AMMONIA in the last 168 hours. CBC: Recent Labs  Lab 11/30/17 0017  WBC 10.7*  NEUTROABS 6.3  HGB 13.2  HCT 41.2  MCV 89.0  PLT 388   Cardiac Enzymes: Recent Labs  Lab 11/30/17 0546  TROPONINI <0.03   BNP: Invalid input(s): POCBNP CBG: No results for input(s): GLUCAP in the last 168 hours. D-Dimer No results for input(s): DDIMER in the last 72 hours. Hgb A1c No results for input(s): HGBA1C in the last 72 hours. Lipid Profile No results for input(s): CHOL, HDL, LDLCALC, TRIG, CHOLHDL, LDLDIRECT in the last 72 hours. Thyroid function studies No results for input(s): TSH, T4TOTAL, T3FREE, THYROIDAB in the last 72 hours.  Invalid input(s): FREET3 Anemia work up No results for input(s): VITAMINB12, FOLATE, FERRITIN, TIBC, IRON, RETICCTPCT in the last 72 hours. Urinalysis    Component Value Date/Time   COLORURINE YELLOW 07/08/2016 1651   APPEARANCEUR Clear 09/13/2017 1104   LABSPEC 1.020 07/08/2016 1651   PHURINE 5.0 07/08/2016 1651   GLUCOSEU Negative 09/13/2017 1104   HGBUR NEGATIVE 07/08/2016  1651   BILIRUBINUR Negative 09/13/2017 1104   KETONESUR NEGATIVE 07/08/2016 1651   PROTEINUR Negative 09/13/2017 1104   PROTEINUR NEGATIVE 07/08/2016 1651   UROBILINOGEN 0.2 05/22/2011 1953   NITRITE Negative 09/13/2017 1104   NITRITE NEGATIVE 07/08/2016 1651   LEUKOCYTESUR Negative 09/13/2017 1104   Sepsis Labs Invalid input(s): PROCALCITONIN,  WBC,  LACTICIDVEN Microbiology No results found for this or any previous visit (from the past 240 hour(s)).   Time coordinating discharge: 32mins  SIGNED:   Kathie Dike, MD  [facility] 11/30/2017, 6:59 PM Pager   If 7PM-7AM, please contact night-coverage [email or web address] Password [email or web address]

## 2017-11-30 NOTE — H&P (Signed)
TRH H&P    Patient Demographics:    [name] [name], is a 67 y.o. female  MRN: [id number]  DOB - [birth year]  Admit Date - 11/29/2017  Referring MD/NP/PA: Dr. Roxanne Mins  Outpatient Primary MD for the patient is Janora Norlander, DO  Patient coming from: Home  Chief complaint-dizziness   HPI:    [name] [name]  is a 67 y.o. female, with history of GERD, hypertension, depression, CAD status post CABG x2 vessel in 2013 came to hospital after patient started feeling dizziness around 11 PM.  Patient says that she also felt heartburn.  She denies passing out.  No nausea but had one episode of vomiting in the ED.  Denies shortness of breath.  No previous history of stroke or seizures.  Patient was given Reglan along with Benadryl and Ativan in the ED with some improvement but continued to have dizziness especially on walking. CT of the head was negative for stroke. She denies shortness of breath. Denies fever or dysuria.    Review of systems:      All other systems reviewed and are negative.   With Past History of the following :    Past Medical History:  Diagnosis Date  . Anxiety   . CAD (coronary artery disease)    LM/circumflex disease s/p LIMA to LAD and SVG to OM2 2013  . Depression   . Essential hypertension   . GERD (gastroesophageal reflux disease)   . Hemorrhoids   . IBS (irritable bowel syndrome)   . Renal cyst, left       Past Surgical History:  Procedure Laterality Date  . ABDOMINAL HYSTERECTOMY  2003  . CORONARY ARTERY BYPASS GRAFT  05/26/2011   Procedure: CORONARY ARTERY BYPASS GRAFTING (CABG);  Surgeon: Ivin Poot, MD;  Location: [facility];  Service: Open Heart Surgery;  Laterality: N/A;  Coronary Artery Bypass Graft on Pump times two utlizing left internal mammary artery and right greater saphenous vein harvested endoscopically   . LEFT HEART CATHETERIZATION WITH CORONARY ANGIOGRAM N/A  05/24/2011   Procedure: LEFT HEART CATHETERIZATION WITH CORONARY ANGIOGRAM;  Surgeon: Leonie Man, MD;  Location: [name] CATH LAB;  Service: Cardiovascular;  Laterality: N/A;  . TONSILLECTOMY  1962  . TUBAL LIGATION        Social History:      Social History   Tobacco Use  . Smoking status: Former Smoker    Types: Cigarettes    Last attempt to quit: 05/23/2006    Years since quitting: 11.5  . Smokeless tobacco: Never Used  Substance Use Topics  . Alcohol use: Yes    Alcohol/week: 0.0 standard drinks    Comment: rare glass of wine       Family History :     Family History  Problem Relation Age of Onset  . Stroke Mother   . Diabetes Mother   . Heart failure Mother   . Cancer Father 55  . Drug abuse Sister   . Cancer Brother   . Alcohol abuse Sister   . Colon cancer  Neg Hx   . Breast cancer Neg Hx       Home Medications:   Prior to Admission medications   Medication Sig Start Date End Date Taking? Authorizing Provider  albuterol (PROVENTIL HFA;VENTOLIN HFA) 108 (90 Base) MCG/ACT inhaler Inhale 2 puffs into the lungs every 6 (six) hours as needed for wheezing or shortness of breath. 11/19/17   Janora Norlander, DO  aspirin 81 MG tablet Take 81 mg by mouth daily.    [provider]  Aspirin-Acetaminophen-Caffeine (EXCEDRIN PO) Take by mouth.    [provider]  atorvastatin (LIPITOR) 20 MG tablet Take 20 mg by mouth every other day.    [provider]  DULoxetine (CYMBALTA) 30 MG capsule TAKE 1 CAPSULE BY MOUTH EVERY DAY 11/28/17   Ronnie Doss M, DO  famotidine (PEPCID) 20 MG tablet Take 1 tablet (20 mg total) by mouth 2 (two) times daily. 08/18/17   Evalee Jefferson, PA-C  nitroGLYCERIN (NITROSTAT) 0.4 MG SL tablet Place 1 tablet (0.4 mg total) under the tongue every 5 (five) minutes as needed for chest pain. 04/03/17 10/19/17  Troy Sine, MD  pantoprazole (PROTONIX) 40 MG tablet Take 40 mg by mouth daily as needed.    [provider]  spironolactone (ALDACTONE) 25 MG tablet Take 0.5 tablets (12.5 mg total) by mouth daily. 08/20/17   Janora Norlander, DO  traMADol (ULTRAM) 50 MG tablet Take 1 tablet (50 mg total) by mouth every 12 (twelve) hours as needed. Patient not taking: Reported on 11/27/2017 09/13/17   Sharion Balloon, FNP     Allergies:     Allergies  Allergen Reactions  . Amlodipine Swelling  . Cetirizine & Related Other (See Comments)    Extreme Drowsiness - "knocks me loopy for 2 days"  . Latex Rash  . Levofloxacin Nausea And Vomiting  . Lisinopril Other (See Comments)    Excessive mucus production  . Prednisone Other (See Comments)    High dose - hallucinations  . Tape Rash     Physical Exam:   Vitals  Blood pressure (!) 142/72, pulse (!) 58, temperature 97.9 F (36.6 C), temperature source Oral, resp. rate 14, height _0  (1.6 m), weight 72.6 kg, SpO2 97 %.  1.  General: Appears in no acute distress  2. Psychiatric:  Intact judgement and  insight, awake alert, oriented x 3.  3. Neurologic: No focal neurological deficits, all cranial nerves intact.Strength 5/5 all 4 extremities, sensation intact all 4 extremities, plantars down going.  4. Eyes :  anicteric sclerae, moist conjunctivae with no lid lag. PERRLA.  5. ENMT:  Oropharynx clear with moist mucous membranes and good dentition  6. Neck:  supple, no cervical lymphadenopathy appriciated, No thyromegaly  7. Respiratory : Normal respiratory effort, good air movement bilaterally,clear to  auscultation bilaterally  8. Cardiovascular : RRR, no gallops, rubs or murmurs, no leg edema  9. Gastrointestinal:  Positive bowel sounds, abdomen soft, non-tender to palpation,no hepatosplenomegaly, no rigidity or guarding       10. Skin:  No cyanosis, normal texture and turgor, no rash, lesions or ulcers  11.Musculoskeletal:  Good muscle tone,  joints appear normal , no effusions,  normal range of motion    Data Review:     CBC Recent Labs  Lab 11/30/17 0017  WBC 10.7*  HGB 13.2  HCT 41.2  PLT 388  MCV 89.0  MCH 28.5  MCHC 32.0  RDW 13.5  LYMPHSABS 3.2  MONOABS 0.8  EOSABS 0.3  BASOSABS 0.1   ------------------------------------------------------------------------------------------------------------------  Results for orders placed or performed during the hospital encounter of 11/29/17 (from the past 48 hour(s))  Basic metabolic panel     Status: Abnormal   Collection Time: 11/30/17 12:17 AM  Result Value Ref Range   Sodium 139 135 - 145 mmol/L   Potassium 3.8 3.5 - 5.1 mmol/L   Chloride 105 98 - 111 mmol/L   CO2 28 22 - 32 mmol/L   Glucose, Bld 104 (H) 70 - 99 mg/dL   BUN 22 8 - 23 mg/dL   Creatinine, Ser 0.75 0.44 - 1.00 mg/dL   Calcium 8.8 (L) 8.9 - 10.3 mg/dL   GFR calc non Af Amer >60 >60 mL/min   GFR calc Af Amer >60 >60 mL/min    Comment: (NOTE) The eGFR has been calculated using the CKD EPI equation. This calculation has not been validated in all clinical situations. eGFR's persistently <60 mL/min signify possible Chronic Kidney Disease.    Anion gap 6 5 - 15    Comment: Performed at [facility], [address] [place], [place] [address]  CBC with Differential     Status: Abnormal   Collection Time: 11/30/17 12:17 AM  Result Value Ref Range   WBC 10.7 (H) 4.0 - 10.5 K/uL   RBC 4.63 3.87 - 5.11 MIL/uL   Hemoglobin 13.2 12.0 - 15.0 g/dL   HCT 41.2 36.0 - 46.0 %   MCV 89.0 80.0 - 100.0 fL   MCH 28.5 26.0 - 34.0 pg   MCHC 32.0 30.0 - 36.0 g/dL   RDW 13.5 11.5 - 15.5 %   Platelets 388 150 - 400 K/uL   nRBC 0.0 0.0 - 0.2 %   Neutrophils Relative % 59 %   Neutro Abs 6.3 1.7 - 7.7 K/uL   Lymphocytes Relative 30 %   Lymphs Abs 3.2 0.7 - 4.0 K/uL   Monocytes Relative 7 %   Monocytes Absolute 0.8 0.1 - 1.0 K/uL   Eosinophils Relative 2 %   Eosinophils Absolute 0.3 0.0 - 0.5 K/uL   Basophils Relative 1 %   Basophils Absolute 0.1 0.0 - 0.1 K/uL   Immature Granulocytes 1 %    Abs Immature Granulocytes 0.06 0.00 - 0.07 K/uL    Comment: Performed at [facility], [address], [place], [place] [address]    Chemistries  Recent Labs  Lab 11/30/17 0017  NA 139  K 3.8  CL 105  CO2 28  GLUCOSE 104*  BUN 22  CREATININE 0.75  CALCIUM 8.8*    --------------------------------------------------------------------------------------------------------------- Urine analysis:    Component Value Date/Time   COLORURINE YELLOW 07/08/2016 1651   APPEARANCEUR Clear 09/13/2017 1104   LABSPEC 1.020 07/08/2016 1651   PHURINE 5.0 07/08/2016 1651   GLUCOSEU Negative 09/13/2017 1104   HGBUR NEGATIVE 07/08/2016 1651   BILIRUBINUR Negative 09/13/2017 1104   KETONESUR NEGATIVE 07/08/2016 1651   PROTEINUR Negative 09/13/2017 1104   PROTEINUR NEGATIVE 07/08/2016 1651   UROBILINOGEN 0.2 05/22/2011 1953   NITRITE Negative 09/13/2017 1104   NITRITE NEGATIVE 07/08/2016 1651   LEUKOCYTESUR Negative 09/13/2017 1104      Imaging Results:    My personal review of EKG: Rhythm NSR, nonspecific T wave abnormalities   Assessment & Plan:    Active Problems:   Vertigo   1. Vertigo-placed under observation, will obtain MRI brain to rule out posterior circulation stroke.  Start meclizine 25 mg 3 times daily as needed.  2. Hypertension-hold Aldactone for  permissive hypertension.  3. CAD status post CABG-stable, she did have heartburn.  Will obtain serial troponin every 6 hours x3.  Continue aspirin, Lipitor.  EKG shows no significant abnormality  DVT Prophylaxis-   Lovenox   AM Labs Ordered, also please review Full Orders  Family Communication: Admission, patients condition and plan of care including tests being ordered have been discussed with the patient  who indicate understanding and agree with the plan and Code Status.  Code Status: Full code  Admission status: Observation  Time spent in minutes : 60 minutes   Oswald Hillock M.D on 11/30/2017 at 4:21 AM  Between  7am to 7pm - Pager - [phone]. After 7pm go to [email or web address] - password [facility]  [facility] - Office  [phone]

## 2017-11-30 NOTE — Progress Notes (Signed)
IV removed, WNL. D/C instructions given to pt. Verbalized understanding. Pt daughter at bedside to transport home.  

## 2017-11-30 NOTE — Evaluation (Signed)
Physical Therapy Evaluation Patient Details Name: [name] [name] MRN: [id number] DOB: Aug 29, 1950 Today's Date: 11/30/2017   History of Present Illness  [name] [name]  is a 67 y.o. female, with history of GERD, hypertension, depression, CAD status post CABG x2 vessel in 2013 came to hospital after patient started feeling dizziness around 11 PM.  Patient says that she also felt heartburn.  She denies passing out.  No nausea but had one episode of vomiting in the ED.  Denies shortness of breath.  No previous history of stroke or seizures.  Patient was given Reglan along with Benadryl and Ativan in the ED with some improvement but continued to have dizziness especially on walking.    Clinical Impression  Patient functioning at baseline for functional mobility and gait.  Patient has been taking meclizine since in hospital and now has no c/o dizziness.  Explained to patient in order to be assessed for vertigo, it requires being off dizziness medication for at least 4-5 days to properly assess benign paroxysmal positional vertigo (BPPV).  Recommended to patient to monitor blood pressure daily, especially when having episodes of dizziness to possibly rule out significant drops in BP in the light of patient stating she is taking prescription diuretics and blood pressure medication.  Plan:  Patient discharged from physical therapy to care of nursing for ambulation daily as tolerated for length of stay.    Follow Up Recommendations Outpatient PT(for BPPV after ruling out possible BP issues and not on dizziness medication)    Equipment Recommendations  None recommended by PT    Recommendations for Other Services       Precautions / Restrictions Precautions Precautions: None Restrictions Weight Bearing Restrictions: No      Mobility  Bed Mobility Overal bed mobility: Independent                Transfers Overall transfer level: Independent                   Ambulation/Gait Ambulation/Gait assistance: Independent Gait Distance (Feet): 300 Feet Assistive device: None Gait Pattern/deviations: WFL(Within Functional Limits) Gait velocity: normal   General Gait Details: demonstrates good return for ambulation on level, inclined,  and declined surfaces without loss of balance or veering  Stairs Stairs: Yes Stairs assistance: Modified independent (Device/Increase time) Stair Management: One rail Right;One rail Left;Alternating pattern Number of Stairs: 9 General stair comments: demonstrates good return for going up/down stairs without loss of balance  Wheelchair Mobility    Modified Rankin (Stroke Patients Only)       Balance Overall balance assessment: No apparent balance deficits (not formally assessed)                                           Pertinent Vitals/Pain Pain Assessment: No/denies pain    Home Living Family/patient expects to be discharged to:: Private residence Living Arrangements: Alone Available Help at Discharge: Family(daughter can help) Type of Home: House Home Access: Stairs to enter Entrance Stairs-Rails: Right;Left(to wide to reach both) Entrance Stairs-Number of Steps: 6 Home Layout: One level Home Equipment: None      Prior Function Level of Independence: Independent         Comments: community ambulator, drives     Hand Dominance        Extremity/Trunk Assessment   Upper Extremity Assessment Upper Extremity Assessment: Overall WFL for tasks assessed  Lower Extremity Assessment Lower Extremity Assessment: Overall WFL for tasks assessed    Cervical / Trunk Assessment Cervical / Trunk Assessment: Normal  Communication   Communication: No difficulties  Cognition Arousal/Alertness: Awake/alert Behavior During Therapy: WFL for tasks assessed/performed Overall Cognitive Status: Within Functional Limits for tasks assessed                                         General Comments      Exercises     Assessment/Plan    PT Assessment Patent does not need any further PT services  PT Problem List         PT Treatment Interventions      PT Goals (Current goals can be found in the Care Plan section)  Acute Rehab PT Goals Patient Stated Goal: return home PT Goal Formulation: With patient/family Time For Goal Achievement: 11/30/17 Potential to Achieve Goals: Good    Frequency     Barriers to discharge        Co-evaluation               AM-PAC PT "6 Clicks" Daily Activity  Outcome Measure Difficulty turning over in bed (including adjusting bedclothes, sheets and blankets)?: None Difficulty moving from lying on back to sitting on the side of the bed? : None Difficulty sitting down on and standing up from a chair with arms (e.g., wheelchair, bedside commode, etc,.)?: None Help needed moving to and from a bed to chair (including a wheelchair)?: None Help needed walking in hospital room?: None Help needed climbing 3-5 steps with a railing? : None 6 Click Score: 24    End of Session   Activity Tolerance: Patient tolerated treatment well Patient left: in bed;with bed alarm set;with family/visitor present(seated at bedside) Nurse Communication: Mobility status PT Visit Diagnosis: Unsteadiness on feet (R26.81);Other abnormalities of gait and mobility (R26.89);Muscle weakness (generalized) (M62.81)    Time: 1025-8527 PT Time Calculation (min) (ACUTE ONLY): 24 min   Charges:   PT Evaluation $PT Eval Moderate Complexity: 1 Mod PT Treatments $Therapeutic Activity: 23-37 mins        2:19 PM, 11/30/17 Lonell Grandchild, MPT Physical Therapist with [facility] [phone] office [phone] mobile phone

## 2017-11-30 NOTE — ED Notes (Addendum)
Assisted pt up from the bed to get dressed and attempt to ambulate.  Pt moving very slowly, states "I still am very dizzy when I make any movements"   Pt assisted to sit in chair at bedside.  Pt continues to be nauseated.  Pt denies being able to ambulate in the hallway and is assisted back to bed at this time. Dr Roxanne Mins in to see pt to discuss admission.  Pt is agreeable.

## 2017-11-30 NOTE — Care Management (Signed)
Admitted with vertigo. Pt recommended for OP PT and agreeable. She has chosen [facility] after discussion of options. Referral sent.

## 2017-12-01 LAB — HIV ANTIBODY (ROUTINE TESTING W REFLEX): HIV Screen 4th Generation wRfx: NONREACTIVE

## 2017-12-03 ENCOUNTER — Encounter: Payer: Self-pay | Admitting: Family Medicine

## 2017-12-13 ENCOUNTER — Telehealth: Payer: Self-pay | Admitting: Cardiology

## 2017-12-13 NOTE — Telephone Encounter (Signed)
Discussed with patient that her ED visit looked strictly related to vertigo, did not see any mention of cardiac issue.  Was advised to see her pcp in 1-2 weeks.  Also, typically if providers sees anything abnormal on the ekg - they will let you know.  Will forward to Dr. Domenic Polite for his review.  Patient stated that she felt better knowing that he was aware.

## 2017-12-13 NOTE — Telephone Encounter (Signed)
Patient was seen in the ER [name] [name]) 11-29-17 . Patient states that she had EKG but nothing was addressed about the EKG.

## 2017-12-13 NOTE — Telephone Encounter (Signed)
Patient notified and verbalized understanding. 

## 2017-12-13 NOTE — Telephone Encounter (Signed)
I just met [name] [name] recently in September, a former patient of Dr. Claiborne Billings.  I looked at her ECG, rhythm is normal and there were only nonspecific T wave changes that are similar to her previous tracing.

## 2017-12-19 ENCOUNTER — Telehealth: Payer: Self-pay | Admitting: Nurse Practitioner

## 2017-12-19 NOTE — Telephone Encounter (Signed)
   Pt called to report that she had sudden onset of nausea and dizziness, similar to what she experienced when she was seen in the ED and dx with vertigo.  She checked her BP and systolics were in the 720'N to 170's.  She called to find out if she needs to go to the ED.  I reassured that I did not think that moderately elevated blood pressures were causing her dizziness, and that it was potentially more likely that feeling poorly was causing her BP to rise.  She plans to take an extra 12.5 mg of spironolactone tonight along with a meclizine.  She has already taken zofran and nausea is a little bit better.  I agreed with her plan to take spiro/meclizine.  I advised that I don't think that there is a need for her to go the ED so long as she is stable and offered reassurance that her BP, although high, was not dangerously high at this point.  Caller verbalized understanding and was grateful for the call back.  Murray Hodgkins, NP 12/19/2017, 6:30 PM

## 2017-12-21 ENCOUNTER — Telehealth: Payer: Self-pay | Admitting: Cardiology

## 2017-12-21 NOTE — Telephone Encounter (Signed)
Pt will continue spironolactone 25 mg daily and keep BP readings - currently looking for another pcp - will update [place] on BP in the next week or so

## 2017-12-21 NOTE — Telephone Encounter (Signed)
Chart reviewed since our first encounter in September.  It sounds like she has also been having some trouble with vertigo.  Aldactone was increased by Mr. Sharolyn Douglas NP to 25 mg daily just a few days ago.  I would continue on this medication longer before making other adjustments.  She could check blood pressure twice a day for the next 1 to 2 weeks.  I see that she has intolerances to both Norvasc and lisinopril.  If additional medication is needed, possibility of initiating low-dose Cozaar would be next.  In the meanwhile she should follow-up with her PCP as well to make sure that there are not other issues going on that could be driving up her blood pressure.

## 2017-12-21 NOTE — Telephone Encounter (Signed)
Pt says for the last few days BP has been running higher than her normal and she spoke with the on call provider on 11/6 (see phone note) has increased spironolactone 25 mg daily and BP from yesterday 144/92 HR 52,158/72 HR 67, 171/87 HR 67,  last night 177/90 HR 60 167/92 HR 50 this morning 164/90 HR 59 after medications 150/79 HR 74 - pt says she has been having pain " between her chest and stomach " says she will be seeing GI for possible ulcer on 11/15

## 2017-12-21 NOTE — Telephone Encounter (Signed)
Patient called stating that her BP continues to go up. She states that her BP medication is not working.

## 2017-12-25 DIAGNOSIS — R69 Illness, unspecified: Secondary | ICD-10-CM | POA: Diagnosis not present

## 2017-12-26 ENCOUNTER — Telehealth: Payer: Self-pay | Admitting: Cardiology

## 2017-12-26 DIAGNOSIS — I10 Essential (primary) hypertension: Secondary | ICD-10-CM

## 2017-12-26 MED ORDER — LOSARTAN POTASSIUM 25 MG PO TABS: 12.5000 mg | ORAL_TABLET | Freq: Every day | ORAL | 3 refills | Status: DC

## 2017-12-26 NOTE — Telephone Encounter (Signed)
Patient called to report her twice daily BP readings since recent increase in spironolactone dose to 25 mg daily.  11/09 am BP 140/84 & HR 63--pm BP 165/90 & HR 71  11/10 am BP 149/94 & HR 56--pm BP   156/85 & HR 77  11/11 am BP 156/85 & HR 57  11/12 am BP 143/81 & HR 58--pm BP   168/82 & HR 57  11/13 am BP 144/72 & HR 62

## 2017-12-26 NOTE — Telephone Encounter (Signed)
Patient informed and verbalized understanding. Lab order faxed to [facility] lab.

## 2017-12-26 NOTE — Telephone Encounter (Signed)
Noted.  Would continue Aldactone 25 mg daily.  I would suggest adding Cozaar starting at 12.5 mg once daily.  Suspect that this combination of medicines will provide better blood pressure control.  She would need to have a BMET in 7 to 10 days after starting Cozaar.  Lisinopril is listed in her allergy section of the chart, but is an intolerance rather than a true allergy.

## 2017-12-26 NOTE — Telephone Encounter (Signed)
Patient called stating that she needs to call in her BP readings.

## 2017-12-28 ENCOUNTER — Encounter: Payer: Self-pay | Admitting: Internal Medicine

## 2017-12-28 ENCOUNTER — Ambulatory Visit: Payer: Medicare HMO | Admitting: Internal Medicine

## 2017-12-28 ENCOUNTER — Encounter

## 2017-12-28 VITALS — BP 122/68 | HR 72 | Ht 63.0 in | Wt 163.5 lb

## 2017-12-28 DIAGNOSIS — K219 Gastro-esophageal reflux disease without esophagitis: Secondary | ICD-10-CM

## 2017-12-28 DIAGNOSIS — Z86010 Personal history of colonic polyps: Secondary | ICD-10-CM | POA: Diagnosis not present

## 2017-12-28 MED ORDER — NA SULFATE-K SULFATE-MG SULF 17.5-3.13-1.6 GM/177ML PO SOLN: 1.0000 | Freq: Once | ORAL | 0 refills | Status: AC

## 2017-12-28 MED ORDER — PANTOPRAZOLE SODIUM 40 MG PO TBEC: 40.0000 mg | DELAYED_RELEASE_TABLET | Freq: Every day | ORAL | 3 refills | Status: DC

## 2017-12-28 NOTE — Patient Instructions (Signed)
You have been scheduled for an endoscopy and colonoscopy. Please follow the written instructions given to you at your visit today. Please pick up your prep supplies at the pharmacy within the next 1-3 days. If you use inhalers (even only as needed), please bring them with you on the day of your procedure.  We have sent the following medications to your pharmacy for you to pick up at your convenience:  Protonix

## 2017-12-28 NOTE — Progress Notes (Signed)
HISTORY OF PRESENT ILLNESS:  [name] [name] is a 67 y.o. female with past medical history as listed below who is self-referred regarding chronic GERD, the need for reflux medication, and surveillance colonoscopy.  The patient is a previous patient of Dr. Delfin Edis.  She underwent complete colonoscopy June 2016.  She was found to have an advanced adenoma (15 mm tubulovillous adenoma).  Also mild sigmoid diverticulosis.  Patient tells me that she has had chronic GERD.  Currently off her pantoprazole with significant symptoms.  These include classic reflux symptoms such as pyrosis as well as belching and nausea.  She denies dysphasia.  She does raise concerns over possible Barrett's esophagus.  No prior EGD.  Review of outside laboratories from October 2019 finds unremarkable basic metabolic panel with normal kidney function.  Normal hemoglobin at 13.2.  Review of relevant outside x-rays includes an abdominal ultrasound from February 2019 to evaluate nausea with vomiting.  Examination was normal including a normal gallbladder.    REVIEW OF SYSTEMS:  All non-GI ROS negative unless otherwise stated in the HPI except for sinus and allergy, hearing problems, headaches, sore throat, sleeping problems  Past Medical History:  Diagnosis Date  . Anxiety   . CAD (coronary artery disease)    LM/circumflex disease s/p LIMA to LAD and SVG to OM2 2013  . Depression   . Essential hypertension   . GERD (gastroesophageal reflux disease)   . Hemorrhoids   . IBS (irritable bowel syndrome)   . Renal cyst, left     Past Surgical History:  Procedure Laterality Date  . ABDOMINAL HYSTERECTOMY  2003  . CORONARY ARTERY BYPASS GRAFT  05/26/2011   Procedure: CORONARY ARTERY BYPASS GRAFTING (CABG);  Surgeon: Ivin Poot, MD;  Location: [facility];  Service: Open Heart Surgery;  Laterality: N/A;  Coronary Artery Bypass Graft on Pump times two utlizing left internal mammary artery and right greater saphenous vein harvested  endoscopically   . LEFT HEART CATHETERIZATION WITH CORONARY ANGIOGRAM N/A 05/24/2011   Procedure: LEFT HEART CATHETERIZATION WITH CORONARY ANGIOGRAM;  Surgeon: Leonie Man, MD;  Location: [facility] CATH LAB;  Service: Cardiovascular;  Laterality: N/A;  . TONSILLECTOMY  1962  . TUBAL LIGATION      Social History [name] [name]  reports that she quit smoking about 11 years ago. Her smoking use included cigarettes. She has never used smokeless tobacco. She reports that she drinks alcohol. She reports that she does not use drugs.  family history includes Alcohol abuse in her sister; Cancer in her brother; Cancer (age of onset: 45) in her father; Diabetes in her mother; Drug abuse in her sister; Heart failure in her mother; Stroke in her mother.  Allergies  Allergen Reactions  . Amlodipine Swelling  . Cetirizine & Related Other (See Comments)    Extreme Drowsiness - "knocks me loopy for 2 days"  . Latex Rash  . Levofloxacin Nausea And Vomiting  . Lisinopril Other (See Comments)    Excessive mucus production  . Prednisone Other (See Comments)    High dose - hallucinations  . Tape Rash       PHYSICAL EXAMINATION: Vital signs: BP 122/68   Pulse 72   Ht 5\' 3"  (1.6 m)   Wt 163 lb 8 oz (74.2 kg)   BMI 28.96 kg/m   Constitutional: generally well-appearing, no acute distress Psychiatric: alert and oriented x3, cooperative Eyes: extraocular movements intact, anicteric, conjunctiva pink Mouth: oral pharynx moist, no lesions Neck: supple no lymphadenopathy Cardiovascular:  heart regular rate and rhythm, no murmur Lungs: clear to auscultation bilaterally Abdomen: soft, nontender, nondistended, no obvious ascites, no peritoneal signs, normal bowel sounds, no organomegaly Rectal: Deferred until colonoscopy Extremities: no clubbing, cyanosis, or lower extremity edema bilaterally Skin: no lesions on visible extremities Neuro: No focal deficits.  Cranial nerves intact  ASSESSMENT:  1.  Chronic  GERD.  Significant symptoms off PPI 2.  History of advanced colonic adenoma on colonoscopy with Dr.Brodie June 2016.  Slightly overdue for follow-up 3.  Multiple medical problems   PLAN:  1.  Reflux precautions 2.  Prescribe pantoprazole 40 mg daily.  Multiple refills 3.  Schedule upper endoscopy to evaluate chronic GERD.  Rule out Barrett's 4.  Schedule surveillance colonoscopy. 5.  Ongoing general medical care with PCP 6.  Routine annual GI follow-up regarding GERD and chronic PPI use

## 2017-12-31 ENCOUNTER — Telehealth: Payer: Self-pay | Admitting: Internal Medicine

## 2017-12-31 MED ORDER — NA SULFATE-K SULFATE-MG SULF 17.5-3.13-1.6 GM/177ML PO SOLN: 1.0000 | Freq: Once | ORAL | 0 refills | Status: AC

## 2017-12-31 NOTE — Telephone Encounter (Signed)
[facility] [place]

## 2017-12-31 NOTE — Telephone Encounter (Signed)
Pt calling requesting [facility] [place]. She called her pharmacy today and they stated that they have not received prescription yet.

## 2018-01-01 ENCOUNTER — Ambulatory Visit (INDEPENDENT_AMBULATORY_CARE_PROVIDER_SITE_OTHER): Payer: Medicare HMO | Admitting: Family Medicine

## 2018-01-01 ENCOUNTER — Other Ambulatory Visit: Payer: Self-pay

## 2018-01-01 ENCOUNTER — Encounter: Payer: Self-pay | Admitting: Family Medicine

## 2018-01-01 VITALS — BP 132/82 | HR 60 | Temp 98.2°F | Ht 64.5 in | Wt 166.0 lb

## 2018-01-01 DIAGNOSIS — I10 Essential (primary) hypertension: Secondary | ICD-10-CM

## 2018-01-01 DIAGNOSIS — F339 Major depressive disorder, recurrent, unspecified: Secondary | ICD-10-CM | POA: Diagnosis not present

## 2018-01-01 DIAGNOSIS — L509 Urticaria, unspecified: Secondary | ICD-10-CM | POA: Insufficient documentation

## 2018-01-01 DIAGNOSIS — J309 Allergic rhinitis, unspecified: Secondary | ICD-10-CM | POA: Insufficient documentation

## 2018-01-01 DIAGNOSIS — Z62819 Personal history of unspecified abuse in childhood: Secondary | ICD-10-CM | POA: Insufficient documentation

## 2018-01-01 DIAGNOSIS — H9193 Unspecified hearing loss, bilateral: Secondary | ICD-10-CM

## 2018-01-01 DIAGNOSIS — I2510 Atherosclerotic heart disease of native coronary artery without angina pectoris: Secondary | ICD-10-CM | POA: Diagnosis not present

## 2018-01-01 DIAGNOSIS — H6983 Other specified disorders of Eustachian tube, bilateral: Secondary | ICD-10-CM | POA: Diagnosis not present

## 2018-01-01 DIAGNOSIS — H9190 Unspecified hearing loss, unspecified ear: Secondary | ICD-10-CM | POA: Insufficient documentation

## 2018-01-01 DIAGNOSIS — R69 Illness, unspecified: Secondary | ICD-10-CM | POA: Diagnosis not present

## 2018-01-01 DIAGNOSIS — K635 Polyp of colon: Secondary | ICD-10-CM | POA: Insufficient documentation

## 2018-01-01 HISTORY — DX: Atherosclerotic heart disease of native coronary artery without angina pectoris: I25.10
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
## 2018-01-01 NOTE — Patient Instructions (Signed)
Please return in June 2020 for your annual complete physical; please come fasting.  You are doing well!!  It was a pleasure meeting you today! Thank you for choosing [place] to meet your healthcare needs! I truly look forward to working with you. If you have any questions or concerns, please send me a message via Mychart or call the office at [phone].

## 2018-01-07 ENCOUNTER — Other Ambulatory Visit (HOSPITAL_COMMUNITY): Admission: RE | Admit: 2018-01-07 | Payer: Medicare HMO | Source: Ambulatory Visit | Admitting: Cardiology

## 2018-01-18 ENCOUNTER — Other Ambulatory Visit: Payer: Self-pay | Admitting: Family Medicine

## 2018-01-18 DIAGNOSIS — E2839 Other primary ovarian failure: Secondary | ICD-10-CM

## 2018-01-18 DIAGNOSIS — Z780 Asymptomatic menopausal state: Secondary | ICD-10-CM

## 2018-01-21 ENCOUNTER — Ambulatory Visit
Admission: RE | Admit: 2018-01-21 | Discharge: 2018-01-21 | Disposition: A | Discharge status: Home / Self Care | Payer: Medicare HMO | Source: Ambulatory Visit | Attending: Family Medicine | Admitting: Family Medicine

## 2018-01-21 DIAGNOSIS — Z780 Asymptomatic menopausal state: Secondary | ICD-10-CM

## 2018-01-21 DIAGNOSIS — M85851 Other specified disorders of bone density and structure, right thigh: Secondary | ICD-10-CM | POA: Diagnosis not present

## 2018-01-21 DIAGNOSIS — E2839 Other primary ovarian failure: Secondary | ICD-10-CM

## 2018-01-22 ENCOUNTER — Other Ambulatory Visit: Payer: Self-pay | Admitting: Cardiology

## 2018-01-22 MED ORDER — SPIRONOLACTONE 25 MG PO TABS: 25.0000 mg | ORAL_TABLET | Freq: Every day | ORAL | 1 refills | Status: DC

## 2018-01-22 NOTE — Telephone Encounter (Signed)
Medication sent to pharmacy  

## 2018-01-22 NOTE — Telephone Encounter (Signed)
° °  1. Which medications need to be refilled? (please list name of each medication and dose if known)    spironolactone (ALDACTONE) 25 MG tablet   2. Which pharmacy/location (including street and city if local pharmacy) is medication to be sent to?   [facility] [place], [place]    3. Do they need a 30 day or 90 day supply? 90  Patient is requesting to have medication called to the [place], [place]

## 2018-01-29 ENCOUNTER — Encounter: Payer: Self-pay | Admitting: Internal Medicine

## 2018-02-07 ENCOUNTER — Encounter: Payer: Self-pay | Admitting: Family Medicine

## 2018-02-07 ENCOUNTER — Ambulatory Visit: Payer: Self-pay

## 2018-02-07 ENCOUNTER — Ambulatory Visit (INDEPENDENT_AMBULATORY_CARE_PROVIDER_SITE_OTHER): Payer: Medicare HMO | Admitting: Family Medicine

## 2018-02-07 ENCOUNTER — Other Ambulatory Visit: Payer: Self-pay

## 2018-02-07 VITALS — BP 130/80 | HR 62 | Temp 98.3°F | Resp 16 | Ht 65.0 in | Wt 167.5 lb

## 2018-02-07 DIAGNOSIS — R0789 Other chest pain: Secondary | ICD-10-CM

## 2018-02-07 NOTE — Telephone Encounter (Signed)
Pt c/o over the last 3-4 months an intermittent burning and aching pain to the right of her sternum. Pt stated that she wanted to get examined before her colonoscopy this coming Monday.  Pt stated the soreness lasts a few seconds. Pt stated that the pain increases with activity. Pt stated the pain significantly diminishes when she is sitting still. Pt stated that her breasts are very heavy and she thinks that there is a ligament that is inflamed to her chest. Pt is having a colonoscopy Monday. Pt stated that she is trying to find out if she is having any trouble with her esophagus or stomach. Pt stated that she is certain it is not related to her heart. Pt denies dizziness, nausea, vomiting, sweating, fever, difficulty breathing and cough.  Care advice given to pt. No openings today with PCP. Pt accepted an appointment today with Dr Birdie Riddle.  Reason for Disposition . [1] Intermittent chest pain from "angina" AND [2] NO increase in severity or frequency  Answer Assessment - Initial Assessment Questions 1. LOCATION: "Where does it hurt?"       Right of sternum 2. RADIATION: "Does the pain go anywhere else?" (e.g., into neck, jaw, arms, back)     Only when she moves it travels up into right neck - but if sitting still feels soreness 3. ONSET: "When did the chest pain begin?" (Minutes, hours or days)      Last night 4. PATTERN "Does the pain come and go, or has it been constant since it started?"  "Does it get worse with exertion?"      Intermittent- flares with activity- if she doesn't do any activity the pain subsides 5. DURATION: "How long does it last" (e.g., seconds, minutes, hours)     seconds 6. SEVERITY: "How bad is the pain?"  (e.g., Scale 1-10; mild, moderate, or severe)    - MILD (1-3): doesn't interfere with normal activities     - MODERATE (4-7): interferes with normal activities or awakens from sleep    - SEVERE (8-10): excruciating pain, unable to do any normal activities       Mild  1/10 no pain at time of call 7. CARDIAC RISK FACTORS: "Do you have any history of heart problems or risk factors for heart disease?" (e.g., prior heart attack, angina; high blood pressure, diabetes, being overweight, high cholesterol, smoking, or strong family history of heart disease)     High blood pressure-double bypass 2013, overweight, prediabetic, high cholesterol, GF heart attack at age 19 8. PULMONARY RISK FACTORS: "Do you have any history of lung disease?"  (e.g., blood clots in lung, asthma, emphysema, birth control pills)     no 9. CAUSE: "What do you think is causing the chest pain?"     pulled a muscle or inflamed cartilage 10. OTHER SYMPTOMS: "Do you have any other symptoms?" (e.g., dizziness, nausea, vomiting, sweating, fever, difficulty breathing, cough)       no 11. PREGNANCY: "Is there any chance you are pregnant?" "When was your last menstrual period?"       n/a  Protocols used: CHEST PAIN-A-AH

## 2018-02-07 NOTE — Progress Notes (Signed)
   Subjective:    Patient ID: [name] [name], female    DOB: 1950-02-28, 67 y.o.   MRN: [id number]  HPI Chest burning- sxs started 3-4 months ago.  sxs are intermittent.  'I thought it was an ulcer on my esophagus'.  Has EGD and colonoscopy upcoming on Monday.  Last night had pain that was more severe than usual.  She has been taking her Protonix nightly since mid-November w/ big improvement in GERD sxs.  Pain is 'to the R of my sternum, radiates across chest and up neck'.  Pt reports pain improves w/ sitting up in bed and time.  Pt is currently asymptomatic.  sxs will occur 1-2x/month.  Typically occurs in the evening or overnight.  Does not occur w/ exertion.  No associated SOB.  No N/V w/ sxs.   Review of Systems For ROS see HPI     Objective:   Physical Exam Vitals signs reviewed.  Constitutional:      General: She is not in acute distress.    Appearance: She is well-developed.  HENT:     Head: Normocephalic and atraumatic.  Eyes:     Conjunctiva/sclera: Conjunctivae normal.     Pupils: Pupils are equal, round, and reactive to light.  Neck:     Musculoskeletal: Normal range of motion and neck supple.     Thyroid: No thyromegaly.  Cardiovascular:     Rate and Rhythm: Normal rate and regular rhythm.     Heart sounds: Normal heart sounds. No murmur.  Pulmonary:     Effort: Pulmonary effort is normal. No respiratory distress.     Breath sounds: Normal breath sounds.     Comments: CABG scar Chest:     Chest wall: No tenderness.  Abdominal:     General: There is no distension.     Palpations: Abdomen is soft.     Tenderness: There is no abdominal tenderness.  Lymphadenopathy:     Cervical: No cervical adenopathy.  Skin:    General: Skin is warm and dry.  Neurological:     Mental Status: She is alert and oriented to person, place, and time.  Psychiatric:        Behavior: Behavior normal.           Assessment & Plan:  Burning CP- new to provider, recurring issue for pt.   Suspect this is GI related as sxs are not consistent w/ cardiac and EKG is unchanged.  She has EGD schedule for Monday.  On PPI.  Encouraged her to eat throughout the day to avoid excessive acid, avoid eating before bed, and using Tums or Maalox as needed.  Reviewed supportive care and red flags that should prompt return.  Pt expressed understanding and is in agreement w/ plan.

## 2018-02-07 NOTE — Patient Instructions (Signed)
Follow up as needed or as scheduled I suspect your burning chest pain is due to reflux or another GI process EKG looks great!  Unchanged from a year ago. If needed, take Tums or Maalox during your next episode and see if that improves your symptoms Try and avoid lying down after eating- this will worsen reflux Call with any questions or concerns Hang in there!!!

## 2018-02-11 ENCOUNTER — Encounter: Payer: Self-pay | Admitting: Internal Medicine

## 2018-02-11 ENCOUNTER — Ambulatory Visit (AMBULATORY_SURGERY_CENTER): Payer: Medicare HMO | Admitting: Internal Medicine

## 2018-02-11 VITALS — BP 138/70 | HR 58 | Temp 98.0°F | Resp 14 | Ht 64.0 in | Wt 166.0 lb

## 2018-02-11 DIAGNOSIS — I10 Essential (primary) hypertension: Secondary | ICD-10-CM | POA: Diagnosis not present

## 2018-02-11 DIAGNOSIS — I2510 Atherosclerotic heart disease of native coronary artery without angina pectoris: Secondary | ICD-10-CM | POA: Diagnosis not present

## 2018-02-11 DIAGNOSIS — K449 Diaphragmatic hernia without obstruction or gangrene: Secondary | ICD-10-CM | POA: Diagnosis not present

## 2018-02-11 DIAGNOSIS — Z86010 Personal history of colonic polyps: Secondary | ICD-10-CM

## 2018-02-11 DIAGNOSIS — D122 Benign neoplasm of ascending colon: Secondary | ICD-10-CM | POA: Diagnosis not present

## 2018-02-11 DIAGNOSIS — K219 Gastro-esophageal reflux disease without esophagitis: Secondary | ICD-10-CM

## 2018-02-11 DIAGNOSIS — R42 Dizziness and giddiness: Secondary | ICD-10-CM | POA: Diagnosis not present

## 2018-02-11 DIAGNOSIS — E669 Obesity, unspecified: Secondary | ICD-10-CM | POA: Diagnosis not present

## 2018-02-11 LAB — HM COLONOSCOPY

## 2018-02-11 MED ORDER — SODIUM CHLORIDE 0.9 % IV SOLN: 500.0000 mL | Freq: Once | INTRAVENOUS | Status: DC

## 2018-02-11 NOTE — Op Note (Signed)
[facility] Patient Name: [name] [name] Procedure Date: 02/11/2018 1:37 PM MRN: [id number] Endoscopist: Docia Chuck. Henrene Pastor , MD Age: 67 Referring MD:  Date of Birth: 1950-06-01 Gender: Female Account #: [id number] Procedure:                Colonoscopy with cold snare polypectomy x 2 Indications:              High risk colon cancer surveillance: Personal                            history of adenoma (10 mm or greater in size), High                            risk colon cancer surveillance: Personal history of                            adenoma with villous component. Last examination                            June 2016 with Dr. Olevia Perches Medicines:                Monitored Anesthesia Care Procedure:                Pre-Anesthesia Assessment:                           - Prior to the procedure, a History and Physical                            was performed, and patient medications and                            allergies were reviewed. The patient's tolerance of                            previous anesthesia was also reviewed. The risks                            and benefits of the procedure and the sedation                            options and risks were discussed with the patient.                            All questions were answered, and informed consent                            was obtained. Prior Anticoagulants: The patient has                            taken no previous anticoagulant or antiplatelet                            agents. ASA Grade Assessment: II - A patient with  mild systemic disease. After reviewing the risks                            and benefits, the patient was deemed in                            satisfactory condition to undergo the procedure.                           After obtaining informed consent, the colonoscope                            was passed under direct vision. Throughout the                            procedure,  the patient's blood pressure, pulse, and                            oxygen saturations were monitored continuously. The                            Colonoscope was introduced through the anus and                            advanced to the the cecum, identified by                            appendiceal orifice and ileocecal valve. The                            ileocecal valve, appendiceal orifice, and rectum                            were photographed. The quality of the bowel                            preparation was excellent. The colonoscopy was                            performed without difficulty. The patient tolerated                            the procedure well. The bowel preparation used was                            SUPREP. Scope In: 1:47:56 PM Scope Out: 2:01:56 PM Scope Withdrawal Time: 0 hours 10 minutes 25 seconds  Total Procedure Duration: 0 hours 14 minutes 0 seconds  Findings:                 Two polyps were found in the ascending colon. The                            polyps were 1 to 3 mm in size. These polyps were  removed with a cold snare. Resection and retrieval                            were complete.                           Multiple diverticula were found in the sigmoid                            colon and right colon.                           The exam was otherwise without abnormality on                            direct and retroflexion views. Complications:            No immediate complications. Estimated blood loss:                            None. Estimated Blood Loss:     Estimated blood loss: none. Impression:               - Two 1 to 3 mm polyps in the ascending colon,                            removed with a cold snare. Resected and retrieved.                           - Diverticulosis in the sigmoid colon and in the                            right colon.                           - The examination was otherwise normal  on direct                            and retroflexion views. Recommendation:           - Repeat colonoscopy in 5 years for surveillance.                           - Patient has a contact number available for                            emergencies. The signs and symptoms of potential                            delayed complications were discussed with the                            patient. Return to normal activities tomorrow.                            Written discharge instructions were provided to the  patient.                           - Resume previous diet.                           - Continue present medications.                           - Await pathology results. Docia Chuck. Henrene Pastor, MD 02/11/2018 2:08:17 PM This report has been signed electronically.

## 2018-02-11 NOTE — Progress Notes (Signed)
Called to room to assist during endoscopic procedure.  Patient ID and intended procedure confirmed with present staff. Received instructions for my participation in the procedure from the performing physician.  

## 2018-02-11 NOTE — Progress Notes (Signed)
Report to PACU, RN, vss, BBS= Clear.  

## 2018-02-11 NOTE — Op Note (Signed)
[facility] Patient Name: [name] [name] Procedure Date: 02/11/2018 1:36 PM MRN: [id number] Endoscopist: Docia Chuck. Henrene Pastor , MD Age: 67 Referring MD:  Date of Birth: 02-Mar-1950 Gender: Female Account #: [id number] Procedure:                Upper GI endoscopy Indications:              Esophageal reflux Medicines:                Monitored Anesthesia Care Procedure:                Pre-Anesthesia Assessment:                           - Prior to the procedure, a History and Physical                            was performed, and patient medications and                            allergies were reviewed. The patient's tolerance of                            previous anesthesia was also reviewed. The risks                            and benefits of the procedure and the sedation                            options and risks were discussed with the patient.                            All questions were answered, and informed consent                            was obtained. Prior Anticoagulants: The patient has                            taken no previous anticoagulant or antiplatelet                            agents. ASA Grade Assessment: II - A patient with                            mild systemic disease. After reviewing the risks                            and benefits, the patient was deemed in                            satisfactory condition to undergo the procedure.                           After obtaining informed consent, the endoscope was  passed under direct vision. Throughout the                            procedure, the patient's blood pressure, pulse, and                            oxygen saturations were monitored continuously. The                            Model GIF-HQ190 [id number]) scope was introduced                            through the mouth, and advanced to the second part                            of duodenum. The upper GI endoscopy was                             accomplished without difficulty. The patient                            tolerated the procedure well. Scope In: Scope Out: Findings:                 The esophagus was normal.                           The stomach was normal small hiatal hernia.                           The examined duodenum was normal save incidental                            diverticulum in the second portion.                           The cardia and gastric fundus were normal on                            retroflexion. Complications:            No immediate complications. Estimated Blood Loss:     Estimated blood loss: none. Impression:               1. Essentially normal EGD                           2. GERD. Recommendation:           - Patient has a contact number available for                            emergencies. The signs and symptoms of potential                            delayed complications were discussed with the  patient. Return to normal activities tomorrow.                            Written discharge instructions were provided to the                            patient.                           - Resume previous diet.                           - Continue present medications.                           - Routine office follow-up with Dr. Henrene Pastor in 1 year Docia Chuck. Henrene Pastor, MD 02/11/2018 2:14:57 PM This report has been signed electronically.

## 2018-02-11 NOTE — Patient Instructions (Signed)
Information on polyps, diverticulosis, and hemorrhoids given to you today.  Await pathology results.  YOU HAD AN ENDOSCOPIC PROCEDURE TODAY AT [facility]:   Refer to the procedure report that was given to you for any specific questions about what was found during the examination.  If the procedure report does not answer your questions, please call your gastroenterologist to clarify.  If you requested that your care partner not be given the details of your procedure findings, then the procedure report has been included in a sealed envelope for you to review at your convenience later.  YOU SHOULD EXPECT: Some feelings of bloating in the abdomen. Passage of more gas than usual.  Walking can help get rid of the air that was put into your GI tract during the procedure and reduce the bloating. If you had a lower endoscopy (such as a colonoscopy or flexible sigmoidoscopy) you may notice spotting of blood in your stool or on the toilet paper. If you underwent a bowel prep for your procedure, you may not have a normal bowel movement for a few days.  Please Note:  You might notice some irritation and congestion in your nose or some drainage.  This is from the oxygen used during your procedure.  There is no need for concern and it should clear up in a day or so.  SYMPTOMS TO REPORT IMMEDIATELY:   Following lower endoscopy (colonoscopy or flexible sigmoidoscopy):  Excessive amounts of blood in the stool  Significant tenderness or worsening of abdominal pains  Swelling of the abdomen that is new, acute  Fever of 100F or higher   Following upper endoscopy (EGD)  Vomiting of blood or coffee ground material  New chest pain or pain under the shoulder blades  Painful or persistently difficult swallowing  New shortness of breath  Fever of 100F or higher  Black, tarry-looking stools  For urgent or emergent issues, a gastroenterologist can be reached at any hour by calling [phone]  [phone].   DIET:  We do recommend a small meal at first, but then you may proceed to your regular diet.  Drink plenty of fluids but you should avoid alcoholic beverages for 24 hours.  ACTIVITY:  You should plan to take it easy for the rest of today and you should NOT DRIVE or use heavy machinery until tomorrow (because of the sedation medicines used during the test).    FOLLOW UP: Our staff will call the number listed on your records the next business day following your procedure to check on you and address any questions or concerns that you may have regarding the information given to you following your procedure. If we do not reach you, we will leave a message.  However, if you are feeling well and you are not experiencing any problems, there is no need to return our call.  We will assume that you have returned to your regular daily activities without incident.  If any biopsies were taken you will be contacted by phone or by letter within the next 1-3 weeks.  Please call us at [phone] if you have not heard about the biopsies in 3 weeks.    SIGNATURES/CONFIDENTIALITY: You and/or your care partner have signed paperwork which will be entered into your electronic medical record.  These signatures attest to the fact that that the information above on your After Visit Summary has been reviewed and is understood.  Full responsibility of the confidentiality of this discharge information lies with you  and/or your care-partner. 

## 2018-02-12 ENCOUNTER — Telehealth: Payer: Self-pay | Admitting: *Deleted

## 2018-02-12 NOTE — Telephone Encounter (Signed)
  Follow up Call-  Call back number 02/11/2018  Post procedure Call Back phone  # [phone]  Permission to leave phone message Yes  Some recent data might be hidden     Patient questions:  Do you have a fever, pain , or abdominal swelling? No. Pain Score  0 *  Have you tolerated food without any problems? Yes.    Have you been able to return to your normal activities? Yes.    Do you have any questions about your discharge instructions: Diet   No. Medications  No. Follow up visit  No.  Do you have questions or concerns about your Care? No.  Actions: * If pain score is 4 or above: No action needed, pain <4.

## 2018-02-15 ENCOUNTER — Other Ambulatory Visit (HOSPITAL_COMMUNITY)
Admission: RE | Admit: 2018-02-15 | Discharge: 2018-02-15 | Disposition: A | Discharge status: Home / Self Care | Payer: Medicare HMO | Source: Ambulatory Visit | Attending: Cardiology | Admitting: Cardiology

## 2018-02-15 ENCOUNTER — Telehealth: Payer: Self-pay | Admitting: *Deleted

## 2018-02-15 DIAGNOSIS — I10 Essential (primary) hypertension: Secondary | ICD-10-CM | POA: Insufficient documentation

## 2018-02-15 LAB — BASIC METABOLIC PANEL
Anion gap: 6 (ref 5–15)
BUN: 11 mg/dL (ref 8–23)
CO2: 26 mmol/L (ref 22–32)
Calcium: 9.2 mg/dL (ref 8.9–10.3)
Chloride: 106 mmol/L (ref 98–111)
Creatinine, Ser: 0.81 mg/dL (ref 0.44–1.00)
GFR calc Af Amer: >60 mL/min (ref >60)
GFR calc non Af Amer: >60 mL/min (ref >60)
Glucose, Bld: 102 mg/dL — ABNORMAL HIGH (ref 70–99)
Potassium: 4 mmol/L (ref 3.5–5.1)
Sodium: 138 mmol/L (ref 135–145)

## 2018-02-15 MED ORDER — LOSARTAN POTASSIUM 25 MG PO TABS: 12.5000 mg | ORAL_TABLET | Freq: Every day | ORAL | 3 refills | Status: DC

## 2018-02-15 NOTE — Telephone Encounter (Signed)
-----   Message from [name] [name], [place] sent at 02/15/2018 11:22 AM EST ----- Covering for Dr. Domenic Polite - Please let the patient know that her electrolytes and kidney function remain within normal limits. Continue current medication regimen. Please forward a copy of labs to Leamon Arnt, MD.

## 2018-02-15 NOTE — Telephone Encounter (Signed)
Patient informed. Copy sent to PCP °

## 2018-02-18 DIAGNOSIS — R69 Illness, unspecified: Secondary | ICD-10-CM | POA: Diagnosis not present

## 2018-02-19 ENCOUNTER — Encounter: Payer: Self-pay | Admitting: Internal Medicine

## 2018-02-19 ENCOUNTER — Other Ambulatory Visit: Payer: Self-pay | Admitting: Cardiology

## 2018-02-19 MED ORDER — ATORVASTATIN CALCIUM 20 MG PO TABS: 20.0000 mg | ORAL_TABLET | ORAL | 2 refills | Status: DC

## 2018-02-19 NOTE — Telephone Encounter (Signed)
°*  STAT* If patient is at the pharmacy, call can be transferred to refill team.   1. Which medications need to be refilled? atorvastatin (LIPITOR) 20 MG tablet    2. Which pharmacy/location (including street and city if local pharmacy) is medication to be sent to? [facility] in [place]  3. Do they need a 30 day or 90 day supply? [place]

## 2018-03-06 ENCOUNTER — Other Ambulatory Visit: Payer: Self-pay | Admitting: Family Medicine

## 2018-03-20 DIAGNOSIS — R69 Illness, unspecified: Secondary | ICD-10-CM | POA: Diagnosis not present

## 2018-03-25 ENCOUNTER — Encounter: Payer: Self-pay | Admitting: Family Medicine

## 2018-03-25 ENCOUNTER — Ambulatory Visit (INDEPENDENT_AMBULATORY_CARE_PROVIDER_SITE_OTHER): Payer: Medicare HMO | Admitting: Family Medicine

## 2018-03-25 ENCOUNTER — Telehealth: Payer: Self-pay | Admitting: *Deleted

## 2018-03-25 ENCOUNTER — Other Ambulatory Visit (HOSPITAL_COMMUNITY)
Admission: RE | Admit: 2018-03-25 | Discharge: 2018-03-25 | Disposition: A | Discharge status: Home / Self Care | Payer: Medicare HMO | Source: Ambulatory Visit | Attending: Family Medicine | Admitting: Family Medicine

## 2018-03-25 ENCOUNTER — Other Ambulatory Visit: Payer: Self-pay

## 2018-03-25 VITALS — BP 122/70 | HR 60 | Temp 98.1°F | Resp 16 | Ht 65.0 in | Wt 163.6 lb

## 2018-03-25 DIAGNOSIS — N952 Postmenopausal atrophic vaginitis: Secondary | ICD-10-CM | POA: Diagnosis not present

## 2018-03-25 DIAGNOSIS — R8290 Unspecified abnormal findings in urine: Secondary | ICD-10-CM | POA: Diagnosis not present

## 2018-03-25 DIAGNOSIS — N898 Other specified noninflammatory disorders of vagina: Secondary | ICD-10-CM | POA: Diagnosis not present

## 2018-03-25 LAB — POCT URINALYSIS DIPSTICK
Bilirubin, UA: NEGATIVE
Blood, UA: NEGATIVE
Glucose, UA: NEGATIVE
Ketones, UA: NEGATIVE
Leukocytes, UA: NEGATIVE
Nitrite, UA: POSITIVE
Protein, UA: POSITIVE — AB
Spec Grav, UA: 1.025 (ref 1.010–1.025)
Urobilinogen, UA: NEGATIVE E.U./dL — AB
pH, UA: 5.5 (ref 5.0–8.0)

## 2018-03-25 MED ORDER — ESTROGENS, CONJUGATED 0.625 MG/GM VA CREA: 1.0000 | TOPICAL_CREAM | VAGINAL | 5 refills | Status: DC

## 2018-03-25 NOTE — Telephone Encounter (Signed)
Pt aware that the protein may be due to contaminated urine specimen and we will callback with further details when culture is complete.   Copied from [place]. Topic: Quick Communication - Lab Results (Clinic Use ONLY) >> Mar 25, 2018  3:00 PM Lennox Solders wrote: Pt just wanted to bring to dr andy attention she is on new bp med her cardiologist prescribed and her kidney function test was normal. Pt viewed her urine test on mychart and needs someone to explain protein results
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
## 2018-03-25 NOTE — Patient Instructions (Signed)
Please return in June 2020 as scheduled for your physical. Please come fasting.   I will let you know what the test results show when they return.  Start using the vaginal premarin cream 2-3x/week to help.   If you have any questions or concerns, please don't hesitate to send me a message via MyChart or call the office at [phone]. Thank you for visiting with [place] today! It's our pleasure caring for you.   Atrophic Vaginitis  Atrophic vaginitis is a condition in which the tissues that line the vagina become dry and thin. This condition is most common in women who have stopped having regular menstrual periods (are in menopause). This usually starts when a woman is 73-59 years old. That is the time when a woman's estrogen levels begin to drop (decrease). Estrogen is a female hormone. It helps to keep the tissues of the vagina moist. It stimulates the vagina to produce a clear fluid that lubricates the vagina for sexual intercourse. This fluid also protects the vagina from infection. Lack of estrogen can cause the lining of the vagina to get thinner and dryer. The vagina may also shrink in size. It may become less elastic. Atrophic vaginitis tends to get worse over time as a woman's estrogen level drops. What are the causes? This condition is caused by the normal drop in estrogen that happens around the time of menopause. What increases the risk? Certain conditions or situations may lower a woman's estrogen level, leading to a higher risk for atrophic vaginitis. You are more likely to develop this condition if:  You are taking medicines that block estrogen.  You have had your ovaries removed.  You are being treated for cancer with X-ray (radiation) or medicines (chemotherapy).  You have given birth or are breastfeeding.  You are older than age 75.  You smoke. What are the signs or symptoms? Symptoms of this condition include:  Pain, soreness, or bleeding during sexual intercourse  (dyspareunia).  Vaginal burning, irritation, or itching.  Pain or bleeding when a speculum is used in a vaginal exam (pelvic exam).  Having burning pain when passing urine.  Vaginal discharge that is brown or yellow. In some cases, there are no symptoms. How is this diagnosed? This condition is diagnosed by taking a medical history and doing a physical exam. This will include a pelvic exam that checks the vaginal tissues. Though rare, you may also have other tests, including:  A urine test.  A test that checks the acid balance in your vagina (acid balance test). How is this treated? Treatment for this condition depends on how severe your symptoms are. Treatment may include:  Using an over-the-counter vaginal lubricant before sex.  Using a long-acting vaginal moisturizer.  Using low-dose vaginal estrogen for moderate to severe symptoms that do not respond to other treatments. Options include creams, tablets, and inserts (vaginal rings). Before you use a vaginal estrogen, tell your health care provider if you have a history of: ? Breast cancer. ? Endometrial cancer. ? Blood clots. If you are not sexually active and your symptoms are very mild, you may not need treatment. Follow these instructions at home: Medicines  Take over-the-counter and prescription medicines only as told by your health care provider. Do not use herbal or alternative medicines unless your health care provider says that you can.  Use over-the-counter creams, lubricants, or moisturizers for dryness only as directed by your health care provider. General instructions  If your atrophic vaginitis is caused by menopause,  discuss all of your menopause symptoms and treatment options with your health care provider.  Do not douche.  Do not use products that can make your vagina dry. These include: ? Scented feminine sprays. ? Scented tampons. ? Scented soaps.  Vaginal intercourse can help to improve blood flow  and elasticity of vaginal tissue. If it hurts to have sex, try using a lubricant or moisturizer just before having intercourse. Contact a health care provider if:  Your discharge looks different than normal.  Your vagina has an unusual smell.  You have new symptoms.  Your symptoms do not improve with treatment.  Your symptoms get worse. Summary  Atrophic vaginitis is a condition in which the tissues that line the vagina become dry and thin. It is most common in women who have stopped having regular menstrual periods (are in menopause).  Treatment options include using vaginal lubricants and low-dose vaginal estrogen.  Contact a health care provider if your vagina has an unusual smell, or if your symptoms get worse or do not improve after treatment. This information is not intended to replace advice given to you by your health care provider. Make sure you discuss any questions you have with your health care provider. Document Released: 06/16/2014 Document Revised: 10/26/2016 Document Reviewed: 10/26/2016 Elsevier Interactive Patient Education  2019 [employer].

## 2018-03-26 ENCOUNTER — Ambulatory Visit: Payer: Medicare HMO | Admitting: Family Medicine

## 2018-03-26 LAB — CERVICOVAGINAL ANCILLARY ONLY
Bacterial vaginitis: NEGATIVE
Candida vaginitis: NEGATIVE
Chlamydia: NEGATIVE
Neisseria Gonorrhea: NEGATIVE
Trichomonas: NEGATIVE

## 2018-03-27 LAB — URINE CULTURE
MICRO NUMBER:: 175528
SPECIMEN QUALITY:: ADEQUATE

## 2018-03-28 MED ORDER — SULFAMETHOXAZOLE-TRIMETHOPRIM 800-160 MG PO TABS: 1.0000 | ORAL_TABLET | Freq: Two times a day (BID) | ORAL | 0 refills | Status: DC

## 2018-03-28 NOTE — Addendum Note (Signed)
Addended by: Billey Chang on: 03/28/2018 02:36 PM   Modules accepted: Orders

## 2018-04-03 ENCOUNTER — Ambulatory Visit: Payer: Self-pay | Admitting: *Deleted

## 2018-04-03 NOTE — Telephone Encounter (Signed)
Pt reports daughter diagnosed with flu 1 week ago.Pt now with LGT 99.3, productive cough , clear phlegm, mild body aches, nausea, no vomiting. Onset of symptoms Monday evening. Denies SOB, CP, dizziness. States using Flonase and "Cough drops," taking ibuprofen. HAs not traveled internationally. Pt is requesting Tamiflu called to pharmacy.  [facility] [place] [facility] and if called in. CB [phone]   Reason for Disposition . [1] Influenza EXPOSURE (Close Contact) within last 48 hours (2 days) AND [2] exposed person is HIGH RISK (e.g., age > 28 years, pregnant, HIV+, chronic medical condition)  Answer Assessment - Initial Assessment Questions 1. TYPE of EXPOSURE: "How were you exposed?" (e.g., close contact, not a close contact)     Daughter, close contact 2. DATE of EXPOSURE: "When did the exposure occur?" (e.g., hour, days, weeks)     Dtr diagnosed 1 week ago 3. PREGNANCY: "Is there any chance you are pregnant?" "When was your last menstrual period?"     no 4. HIGH RISK for COMPLICATIONS: "Do you have any heart or lung problems? Do you have a weakened immune system?" (e.g., CHF, COPD, asthma, HIV positive, chemotherapy, renal failure, diabetes mellitus, sickle cell anemia)     no 5. SYMPTOMS: "Do you have any symptoms?" (e.g., cough, fever, sore throat, difficulty breathing).    Dry cough, Temp 99.3 nausea, no vomiting, mild body aches  Protocols used: INFLUENZA EXPOSURE-A-AH

## 2018-04-04 ENCOUNTER — Telehealth: Payer: Self-pay | Admitting: Family Medicine

## 2018-04-04 MED ORDER — OSELTAMIVIR PHOSPHATE 75 MG PO CAPS: 75.0000 mg | ORAL_CAPSULE | Freq: Two times a day (BID) | ORAL | 0 refills | Status: DC

## 2018-04-04 NOTE — Telephone Encounter (Signed)
Please call to clarify: diagnosed with flu when, by whom? The time for tamiflu has passed.  OV for evaluation if worsening.

## 2018-04-04 NOTE — Telephone Encounter (Signed)
Please advise 

## 2018-04-04 NOTE — Telephone Encounter (Signed)
Called pt to get clarification, no answer, LMOVM for pt to return call. PEC may clarify.

## 2018-04-04 NOTE — Telephone Encounter (Signed)
Copied from [place] [id number]. Topic: Quick Communication - Rx Refill/Question >> Apr 04, 2018 10:29 AM Scherrie Gerlach wrote: Medication: tami flu  Pt states her sx started Monday, and full blown by Tuesday.  Her daughter dx last week by another dr in [place]. Offered pt an appt, but pt declined, stating she does not feel up to getting out, or even getting out of bed.  [id number] [place] [place] [phone] (Phone) [phone] (Fax)

## 2018-04-04 NOTE — Telephone Encounter (Signed)
Tamiflu sent in. Typically not very helpful is strted > 72 hours after onset of symptoms.   If sxs worsen, rec OV.

## 2018-04-04 NOTE — Telephone Encounter (Signed)
Pt aware and verbalized understanding.  

## 2018-04-19 ENCOUNTER — Ambulatory Visit: Payer: Medicare HMO | Admitting: Family Medicine

## 2018-04-19 ENCOUNTER — Telehealth: Payer: Self-pay | Admitting: Family Medicine

## 2018-04-19 MED ORDER — ESTROGENS, CONJUGATED 0.625 MG/GM VA CREA: 1.0000 g | TOPICAL_CREAM | VAGINAL | 5 refills | Status: DC

## 2018-04-19 NOTE — Telephone Encounter (Signed)
Spoke with patient to let her know new dosage information.  Stated verbal understanding.

## 2018-04-19 NOTE — Telephone Encounter (Signed)
Patient states that she has been using 1 applicator full as described on the instructions.  The Pharmacist told her that she is only supposed to be using 1 gram.   Patient is out of medication and needs a refill, but would like to make sure the instructions are clear on use.     Routing to PCP to clarify.   [facility]ified [facility] [place].

## 2018-04-19 NOTE — Telephone Encounter (Signed)
Copied from [place] [phone]. Topic: Quick Communication - See Telephone Encounter >> Apr 19, 2018 10:54 AM Blase Mess A wrote: CRM for notification. See Telephone encounter for: 04/19/18.  Patient is calling regarding instructions on how to take- conjugated estrogens (PREMARIN) vaginal cream [[id number]] -she would also like a copy of the instructions. Please advise [phone]

## 2018-04-19 NOTE — Telephone Encounter (Signed)
Thanks. Yes I will change the dosing.  May use 1gm 2-3x/ week  New RX sent in with new dose. Please notify pt

## 2018-04-23 DIAGNOSIS — J019 Acute sinusitis, unspecified: Secondary | ICD-10-CM | POA: Diagnosis not present

## 2018-04-23 DIAGNOSIS — B9689 Other specified bacterial agents as the cause of diseases classified elsewhere: Secondary | ICD-10-CM | POA: Diagnosis not present

## 2018-05-15 ENCOUNTER — Encounter: Payer: Self-pay | Admitting: Physician Assistant

## 2018-05-15 ENCOUNTER — Ambulatory Visit (INDEPENDENT_AMBULATORY_CARE_PROVIDER_SITE_OTHER): Payer: Medicare HMO | Admitting: Physician Assistant

## 2018-05-15 ENCOUNTER — Other Ambulatory Visit: Payer: Self-pay

## 2018-05-15 VITALS — BP 141/72 | HR 65 | Temp 97.5°F

## 2018-05-15 DIAGNOSIS — B9689 Other specified bacterial agents as the cause of diseases classified elsewhere: Secondary | ICD-10-CM | POA: Diagnosis not present

## 2018-05-15 DIAGNOSIS — J019 Acute sinusitis, unspecified: Secondary | ICD-10-CM | POA: Diagnosis not present

## 2018-05-15 MED ORDER — CEFDINIR 300 MG PO CAPS: 300.0000 mg | ORAL_CAPSULE | Freq: Two times a day (BID) | ORAL | 0 refills | Status: DC

## 2018-05-15 NOTE — Progress Notes (Signed)
I have discussed the procedure for the virtual visit with the patient who has given consent to proceed with assessment and treatment.   [name] [name], CMA     

## 2018-05-15 NOTE — Progress Notes (Signed)
Virtual Visit via Video Note  I connected with [name] [name] on 05/15/18 at  2:00 PM EDT by a video enabled telemedicine application and verified that I am speaking with the correct person using two identifiers.   I discussed the limitations of evaluation and management by telemedicine and the availability of in person appointments. The patient expressed understanding and agreed to proceed.  History of Present Illness: Patient endorses 6 weeks of nasal congestion with thick, rhinorrhea, sinus pressure and sinus headache. Was seen at Urgent Care 04/23/2018 and diagnosed with sinusitis. Started on course of Augmentin. Noted improvement in symptoms initially on medicine but did not resolve. Symptoms worsened once completed and have persisted since. No with cough from nasal drainage -- still thick. Denies fever, chills. Notes sinus pain.  Observations/Objective: Patient is well-developed, well-nourished in no acute disease  Assessment and Plan: 1. Acute bacterial sinusitis Rx Cefdinir per patient tolerability. Declines Doxycycline or Bactrim as she notes they are ineffective.  Increase fluids.  Rest.  Saline nasal spray.  Probiotic.  Mucinex as directed.  Humidifier in bedroom.  Call or return to clinic if symptoms are not improving.  Follow Up Instructions: Follow-up if nor resolving. Would need to consider ENT or imaging at that time.   I discussed the assessment and treatment plan with the patient. The patient was provided an opportunity to ask questions and all were answered. The patient agreed with the plan and demonstrated an understanding of the instructions.   The patient was advised to call back or seek an in-person evaluation if the symptoms worsen or if the condition fails to improve as anticipated.   Leeanne Rio, PA-C

## 2018-05-15 NOTE — Patient Instructions (Signed)
Summary sent to patient's MyChart  Please take antibiotic as directed (Cefdinir).  Increase fluid intake.  Use Saline nasal spray.  Take a daily multivitamin. Continue the Fluticasone nasal spray. Tylenol if needed for pain. Mucinex can help thin congestion.  Place a humidifier in the bedroom.  Please call or return clinic if symptoms are not improving.  Sinusitis Sinusitis is redness, soreness, and swelling (inflammation) of the paranasal sinuses. Paranasal sinuses are air pockets within the bones of your face (beneath the eyes, the middle of the forehead, or above the eyes). In healthy paranasal sinuses, mucus is able to drain out, and air is able to circulate through them by way of your nose. However, when your paranasal sinuses are inflamed, mucus and air can become trapped. This can allow bacteria and other germs to grow and cause infection. Sinusitis can develop quickly and last only a short time (acute) or continue over a long period (chronic). Sinusitis that lasts for more than 12 weeks is considered chronic.  CAUSES  Causes of sinusitis include:  Allergies.  Structural abnormalities, such as displacement of the cartilage that separates your nostrils (deviated septum), which can decrease the air flow through your nose and sinuses and affect sinus drainage.  Functional abnormalities, such as when the small hairs (cilia) that line your sinuses and help remove mucus do not work properly or are not present. SYMPTOMS  Symptoms of acute and chronic sinusitis are the same. The primary symptoms are pain and pressure around the affected sinuses. Other symptoms include:  Upper toothache.  Earache.  Headache.  Bad breath.  Decreased sense of smell and taste.  A cough, which worsens when you are lying flat.  Fatigue.  Fever.  Thick drainage from your nose, which often is green and may contain pus (purulent).  Swelling and warmth over the affected sinuses. DIAGNOSIS  Your caregiver  will perform a physical exam. During the exam, your caregiver may:  Look in your nose for signs of abnormal growths in your nostrils (nasal polyps).  Tap over the affected sinus to check for signs of infection.  View the inside of your sinuses (endoscopy) with a special imaging device with a light attached (endoscope), which is inserted into your sinuses. If your caregiver suspects that you have chronic sinusitis, one or more of the following tests may be recommended:  Allergy tests.  Nasal culture A sample of mucus is taken from your nose and sent to a lab and screened for bacteria.  Nasal cytology A sample of mucus is taken from your nose and examined by your caregiver to determine if your sinusitis is related to an allergy. TREATMENT  Most cases of acute sinusitis are related to a viral infection and will resolve on their own within 10 days. Sometimes medicines are prescribed to help relieve symptoms (pain medicine, decongestants, nasal steroid sprays, or saline sprays).  However, for sinusitis related to a bacterial infection, your caregiver will prescribe antibiotic medicines. These are medicines that will help kill the bacteria causing the infection.  Rarely, sinusitis is caused by a fungal infection. In theses cases, your caregiver will prescribe antifungal medicine. For some cases of chronic sinusitis, surgery is needed. Generally, these are cases in which sinusitis recurs more than 3 times per year, despite other treatments. HOME CARE INSTRUCTIONS   Drink plenty of water. Water helps thin the mucus so your sinuses can drain more easily.  Use a humidifier.  Inhale steam 3 to 4 times a day (for example, sit  in the bathroom with the shower running).  Apply a warm, moist washcloth to your face 3 to 4 times a day, or as directed by your caregiver.  Use saline nasal sprays to help moisten and clean your sinuses.  Take over-the-counter or prescription medicines for pain, discomfort,  or fever only as directed by your caregiver. SEEK IMMEDIATE MEDICAL CARE IF:  You have increasing pain or severe headaches.  You have nausea, vomiting, or drowsiness.  You have swelling around your face.  You have vision problems.  You have a stiff neck.  You have difficulty breathing. MAKE SURE YOU:   Understand these instructions.  Will watch your condition.  Will get help right away if you are not doing well or get worse. Document Released: 01/30/2005 Document Revised: 04/24/2011 Document Reviewed: 02/14/2011 [facility] Patient Information 2014 [place], [place].

## 2018-05-17 ENCOUNTER — Telehealth: Payer: Self-pay | Admitting: Family Medicine

## 2018-05-17 MED ORDER — ALBUTEROL SULFATE HFA 108 (90 BASE) MCG/ACT IN AERS: 2.0000 | INHALATION_SPRAY | Freq: Four times a day (QID) | RESPIRATORY_TRACT | 0 refills | Status: DC | PRN

## 2018-05-17 NOTE — Telephone Encounter (Signed)
Please call patient: I reviewed chart from visit with [facility] yesterday (not sure why she wasn't scheduled with me).  She will need to give time for the new antibiotic to work.  We are NOT doing breathing treatments in the office due to Covid-19; as well, breathing treatments will not help clear thick drainage. As well, we are not using steroid shots now due to covid-19 as well as this may worsen the disease course.  She may use mucinex twice a day in addition to a decongestant and the antibiotic. She should let us know if she develops sob. And if not resolving, she may need to see an ENT for further help. Let us know.   And it looks like she may be transferring her care to another practice in June.

## 2018-05-17 NOTE — Telephone Encounter (Signed)
Albuterol inhaler refilled 

## 2018-05-17 NOTE — Telephone Encounter (Signed)
Pt states that she had a visit yesterday and was put on an abx. Pt states that the mucus is very thick in back of throat. Pt is stating that she wants to be seen for OV for a breathing treatment or to get a shot to help. I advise pt that someone would call her from our office due to [place] not scheduling OV at this time. She did state an understanding and is awaiting a call back.

## 2018-05-17 NOTE — Telephone Encounter (Signed)
Patient is requesting her inhaler to be refilled. States that it is helping her breathe with the congestion. Please advise. She stated verbal understanding of directions per PCP.

## 2018-06-04 ENCOUNTER — Other Ambulatory Visit: Payer: Self-pay | Admitting: Cardiology

## 2018-06-08 ENCOUNTER — Other Ambulatory Visit: Payer: Self-pay | Admitting: Cardiology

## 2018-06-17 ENCOUNTER — Other Ambulatory Visit: Payer: Self-pay

## 2018-06-17 ENCOUNTER — Ambulatory Visit (INDEPENDENT_AMBULATORY_CARE_PROVIDER_SITE_OTHER): Payer: Medicare HMO | Admitting: Nurse Practitioner

## 2018-06-17 ENCOUNTER — Encounter: Payer: Self-pay | Admitting: Nurse Practitioner

## 2018-06-17 DIAGNOSIS — J301 Allergic rhinitis due to pollen: Secondary | ICD-10-CM | POA: Diagnosis not present

## 2018-06-17 MED ORDER — METHYLPREDNISOLONE 4 MG PO TBPK: ORAL_TABLET | ORAL | 0 refills | Status: DC

## 2018-06-17 MED ORDER — LORATADINE 10 MG PO TABS: 10.0000 mg | ORAL_TABLET | Freq: Every day | ORAL | 11 refills | Status: DC

## 2018-06-17 MED ORDER — FLUTICASONE PROPIONATE 50 MCG/ACT NA SUSP: 2.0000 | Freq: Every day | NASAL | 6 refills | Status: DC

## 2018-06-17 NOTE — Progress Notes (Signed)
Virtual Visit via telephone Note  I connected with [name] [name] on 06/17/18 at 8:10 AM by telephone and verified that I am speaking with the correct person using two identifiers. [name] [name] is currently located at home and  No one is currently with her during visit. The provider, Mary-Margaret Hassell Done, FNP is located in their office at time of visit.  I discussed the limitations, risks, security and privacy concerns of performing an evaluation and management service by telephone and the availability of in person appointments. I also discussed with the patient that there may be a patient responsible charge related to this service. The patient expressed understanding and agreed to proceed.   History and Present Illness:  Sinus Pain Patient complains of congestion, nasal congestion and post nasal drip. Onset of symptoms was 1 month ago. Symptoms have been unchanged since that time. She is drinking plenty of fluids.  Past history is significant for nothing. Patient is non-smoker. She has tried mucinex d , flonase and zyrtec. She has beenon 2 rounds of sntibiotics which have not helped at all.      Review of Systems  Constitutional: Negative for chills and fever.  HENT: Positive for congestion. Negative for sinus pain.   Respiratory: Positive for cough. Negative for shortness of breath and wheezing.   Cardiovascular: Negative for chest pain and palpitations.  Genitourinary: Negative.   Skin: Negative.   Neurological: Negative.   Psychiatric/Behavioral: Negative.   All other systems reviewed and are negative.    Observations/Objective: Alert and oriented- answers all questions appropriately No distresstress noted Voice is hoarse  Assessment and Plan: [name] [name] calls in today with chief complaint of Sinusitis   1. Seasonal allergic rhinitis due to pollen  Meds ordered this encounter  Medications  . methylPREDNISolone (MEDROL DOSEPAK) 4 MG TBPK tablet    Sig: As directed on  package    Dispense:  21 tablet    Refill:  0    Order Specific Question:   Supervising Provider    Answer:   Caryl Pina A [[id number]]  . fluticasone (FLONASE) 50 MCG/ACT nasal spray    Sig: Place 2 sprays into both nostrils daily.    Dispense:  16 g    Refill:  6    Order Specific Question:   Supervising Provider    Answer:   Caryl Pina A [id number]  . loratadine (CLARITIN) 10 MG tablet    Sig: Take 1 tablet (10 mg total) by mouth daily.    Dispense:  30 tablet    Refill:  11    Order Specific Question:   Supervising Provider    Answer:   Caryl Pina A [id number]   Force fluids Wear mask when outside   Follow Up Instructions: RTO prn    I discussed the assessment and treatment plan with the patient. The patient was provided an opportunity to ask questions and all were answered. The patient agreed with the plan and demonstrated an understanding of the instructions.   The patient was advised to call back or seek an in-person evaluation if the symptoms worsen or if the condition fails to improve as anticipated.  The above assessment and management plan was discussed with the patient. The patient verbalized understanding of and has agreed to the management plan. Patient is aware to call the clinic if symptoms persist or worsen. Patient is aware when to return to the clinic for a follow-up visit. Patient educated on when it is appropriate to  go to the emergency department.   Time call ended:  8:25  I provided 15 minutes of non-face-to-face time during this encounter.    Mary-Margaret Hassell Done, FNP

## 2018-06-25 ENCOUNTER — Telehealth: Payer: Self-pay | Admitting: Cardiology

## 2018-06-25 ENCOUNTER — Telehealth: Payer: Self-pay | Admitting: Family Medicine

## 2018-06-25 ENCOUNTER — Ambulatory Visit (INDEPENDENT_AMBULATORY_CARE_PROVIDER_SITE_OTHER): Payer: Medicare HMO | Admitting: Family Medicine

## 2018-06-25 ENCOUNTER — Other Ambulatory Visit: Payer: Self-pay

## 2018-06-25 DIAGNOSIS — J309 Allergic rhinitis, unspecified: Secondary | ICD-10-CM | POA: Diagnosis not present

## 2018-06-25 MED ORDER — AZELASTINE HCL 0.1 % NA SOLN: 1.0000 | Freq: Two times a day (BID) | NASAL | 12 refills | Status: DC

## 2018-06-25 NOTE — Progress Notes (Signed)
Telephone visit  Subjective: CC: sinus symptoms PCP: Janora Norlander, DO HPI:[name] [name] is a 68 y.o. female calls for telephone consult today. Patient provides verbal consent for consult held via phone.  Location of patient: home Location of provider: Working remotely from home Others present for call: none  1. Sinus symptoms Patient reports ongoing sinus drainage and postnasal drip since February.  She was seen in urgent care and received antibiotics in February.  She notes that symptoms slightly improved but continued.  She was seen at an outside facility and again was prescribed antibiotics, this time cefdinir in April.  Last week she contacted our office again because she had ongoing symptoms and was prescribed a Medrol Dosepak, Flonase and Claritin.  She notes that the Medrol Dosepak surprisingly did help some with her symptoms particularly the sensation of throat irritation and swelling as result of the postnasal drip.  She notes that she has been hoarse and is concerned now because she has ongoing mucus production and a lump sensation in her throat despite use of Flonase and Claritin religiously.  She is using salt water gargles and was successful in getting a large amount of mucus up at one point which seemed to relieve some of her symptoms but has since not been able to replicate this.  No fevers, chills, hemoptysis.   ROS: Per HPI  Allergies  Allergen Reactions  . Amlodipine Swelling  . Cetirizine & Related Other (See Comments)    Extreme Drowsiness - "knocks me loopy for 2 days"  . Latex Rash  . Levofloxacin Nausea And Vomiting  . Lisinopril Other (See Comments)    Excessive mucus production  . Prednisone Other (See Comments)    High dose - hallucinations  . Tape Rash   Past Medical History:  Diagnosis Date  . Allergy   . Anxiety   . Arthritis   . CAD (coronary artery disease), native coronary artery - s/p CABG 2013 01/01/2018  . Depression   . Essential  hypertension   . GERD (gastroesophageal reflux disease)   . Hemorrhoids   . IBS (irritable bowel syndrome)   . Renal cyst, left   . Sleep apnea     Current Outpatient Medications:  .  albuterol (PROVENTIL HFA;VENTOLIN HFA) 108 (90 Base) MCG/ACT inhaler, Inhale 2 puffs into the lungs every 6 (six) hours as needed for wheezing or shortness of breath., Disp: 2 Inhaler, Rfl: 0 .  aspirin 81 MG tablet, Take 81 mg by mouth daily., Disp: , Rfl:  .  atorvastatin (LIPITOR) 20 MG tablet, TAKE 1 TABLET BY MOUTH EVERY OTHER DAY, Disp: 90 tablet, Rfl: 1 .  Calcium-Magnesium-Vitamin D (CALCIUM MAGNESIUM PO), Take by mouth., Disp: , Rfl:  .  cefdinir (OMNICEF) 300 MG capsule, Take 1 capsule (300 mg total) by mouth 2 (two) times daily., Disp: 20 capsule, Rfl: 0 .  Cholecalciferol (VITAMIN D) 50 MCG (2000 UT) tablet, Take 2,000 Units by mouth daily., Disp: , Rfl:  .  conjugated estrogens (PREMARIN) vaginal cream, Place 0.5 Applicatorfuls vaginally 3 (three) times a week. As needed for vaginal symptoms, Disp: 30 g, Rfl: 5 .  fluticasone (FLONASE) 50 MCG/ACT nasal spray, Place 2 sprays into both nostrils daily., Disp: 16 g, Rfl: 6 .  loratadine (CLARITIN) 10 MG tablet, Take 1 tablet (10 mg total) by mouth daily., Disp: 30 tablet, Rfl: 11 .  losartan (COZAAR) 25 MG tablet, TAKE 0.5 TABLETS (12.5 MG TOTAL) BY MOUTH DAILY. 12/26/17 NEW, Disp: 135 tablet, Rfl: 1 .  methylPREDNISolone (MEDROL DOSEPAK) 4 MG TBPK tablet, As directed on package, Disp: 21 tablet, Rfl: 0 .  nitroGLYCERIN (NITROSTAT) 0.4 MG SL tablet, Place 1 tablet (0.4 mg total) under the tongue every 5 (five) minutes as needed for chest pain., Disp: 75 tablet, Rfl: 1 .  spironolactone (ALDACTONE) 25 MG tablet, Take 1 tablet (25 mg total) by mouth daily., Disp: 90 tablet, Rfl: 1 .  Zinc 15 MG CAPS, Take by mouth., Disp: , Rfl:   Assessment/ Plan: 68 y.o. female   1. Allergic rhinitis, unspecified seasonality, unspecified trigger Ongoing despite use  of Claritin and Flonase.  Astelin prescribed in efforts to relieve rhinorrhea.  Agree with ENT referral to further evaluate given concomitant hoarseness.  We discussed hoarseness most certainly may be a result of ongoing postnasal drip and rhinorrhea.  Again Astelin prescribed in efforts to relieve this.  She may continue Claritin and may increase this to twice daily if needed for ongoing allergy symptoms.  I did caution dryness with that dose. - Ambulatory referral to ENT - azelastine (ASTELIN) 0.1 % nasal spray; Place 1 spray into both nostrils 2 (two) times daily.  Dispense: 30 mL; Refill: 12  Start time: 3:30pm End time: 3:40pm  Total time spent on patient care (including telephone call/ virtual visit): 15 minutes  [place], [place] [phone]

## 2018-06-25 NOTE — Telephone Encounter (Signed)
Asking about changing some of her BP medication. Creating a lot of mucus since end of Feb 2020

## 2018-06-25 NOTE — Telephone Encounter (Signed)
Spoke to patient.  All she needs is A1c.  No need to come in for that.  Lipid not due until 10/2018.

## 2018-06-25 NOTE — Telephone Encounter (Signed)
PT is scheduled to see Dr Darnell Level for CPE on 6/16 pt is wanting to know if Dr Darnell Level would like for her to come in prior to that apt to do her labs or wait until her apt date to do the labs

## 2018-06-25 NOTE — Telephone Encounter (Signed)
Patient is going to speak with pcp and perhaps ask for ENT referral

## 2018-07-12 ENCOUNTER — Telehealth: Payer: Self-pay | Admitting: Cardiology

## 2018-07-12 NOTE — Telephone Encounter (Signed)
Patient called stating that her PCP is wanting to put her on Paxil . She is wanting to know if this interfere with her heart medications.  [phone])

## 2018-07-14 NOTE — Telephone Encounter (Signed)
I am not opposed to her PCP prescribing Paxil, but she would need routine follow-up of lab work and general review of any new side effects. Paxil can increase Lipitor levels and in combination with Aldactone can affect sodium levels.

## 2018-07-15 NOTE — Telephone Encounter (Signed)
Patient notified and verbalized understanding. 

## 2018-07-19 ENCOUNTER — Other Ambulatory Visit: Payer: Self-pay | Admitting: Cardiology

## 2018-07-22 ENCOUNTER — Ambulatory Visit: Payer: Medicare HMO | Admitting: Nurse Practitioner

## 2018-07-23 ENCOUNTER — Encounter: Payer: Medicare HMO | Admitting: Physician Assistant

## 2018-07-23 ENCOUNTER — Encounter: Payer: Medicare HMO | Admitting: Family Medicine

## 2018-07-25 ENCOUNTER — Telehealth: Payer: Self-pay | Admitting: *Deleted

## 2018-07-25 NOTE — Telephone Encounter (Signed)
Yes, okay to go back to Lipitor 20 mg daily if she is tolerating.  If she is having a physical and wellness exam with lab work, she will likely have the tests that we would want - but specifically CMET and fasting lipid panel.

## 2018-07-25 NOTE — Telephone Encounter (Signed)
Patient calling to ask for refill on her Lipitor 20mg .  Stated that she was initially doing this every other day due to legs aching.  Recently she has started doing daily, realized that cholesterol medication was not bothering her.  Is this okay to increase to daily?  Also, she is seeing her pmd 07/29/18 for her wellness and wanted to know if there was any blood work you needed prior to her recall for September.

## 2018-07-26 MED ORDER — ATORVASTATIN CALCIUM 20 MG PO TABS: 20.0000 mg | ORAL_TABLET | Freq: Every day | ORAL | 3 refills | Status: DC

## 2018-07-26 NOTE — Telephone Encounter (Signed)
Patient notified.  Copy of this note sent to pmd at patient request.  New Lipitor prescription sent to [facility] now.

## 2018-07-29 ENCOUNTER — Other Ambulatory Visit: Payer: Self-pay

## 2018-07-30 ENCOUNTER — Encounter: Payer: Self-pay | Admitting: *Deleted

## 2018-07-30 ENCOUNTER — Other Ambulatory Visit: Payer: Self-pay

## 2018-07-30 ENCOUNTER — Telehealth: Payer: Self-pay | Admitting: Cardiology

## 2018-07-30 ENCOUNTER — Ambulatory Visit (INDEPENDENT_AMBULATORY_CARE_PROVIDER_SITE_OTHER): Payer: Medicare HMO | Admitting: Family Medicine

## 2018-07-30 VITALS — BP 156/76 | HR 53 | Temp 98.0°F | Ht 65.0 in | Wt 164.0 lb

## 2018-07-30 DIAGNOSIS — M5442 Lumbago with sciatica, left side: Secondary | ICD-10-CM

## 2018-07-30 DIAGNOSIS — G8929 Other chronic pain: Secondary | ICD-10-CM | POA: Diagnosis not present

## 2018-07-30 DIAGNOSIS — R6884 Jaw pain: Secondary | ICD-10-CM | POA: Diagnosis not present

## 2018-07-30 DIAGNOSIS — M159 Polyosteoarthritis, unspecified: Secondary | ICD-10-CM

## 2018-07-30 DIAGNOSIS — E785 Hyperlipidemia, unspecified: Secondary | ICD-10-CM

## 2018-07-30 DIAGNOSIS — J309 Allergic rhinitis, unspecified: Secondary | ICD-10-CM | POA: Diagnosis not present

## 2018-07-30 DIAGNOSIS — I10 Essential (primary) hypertension: Secondary | ICD-10-CM | POA: Diagnosis not present

## 2018-07-30 DIAGNOSIS — I2510 Atherosclerotic heart disease of native coronary artery without angina pectoris: Secondary | ICD-10-CM | POA: Diagnosis not present

## 2018-07-30 DIAGNOSIS — M150 Primary generalized (osteo)arthritis: Secondary | ICD-10-CM

## 2018-07-30 DIAGNOSIS — Z0001 Encounter for general adult medical examination with abnormal findings: Secondary | ICD-10-CM | POA: Diagnosis not present

## 2018-07-30 DIAGNOSIS — M8949 Other hypertrophic osteoarthropathy, multiple sites: Secondary | ICD-10-CM

## 2018-07-30 LAB — LIPID PANEL

## 2018-07-30 MED ORDER — DICLOFENAC SODIUM 1 % TD GEL: 4.0000 g | Freq: Four times a day (QID) | TRANSDERMAL | 1 refills | Status: DC

## 2018-07-30 MED ORDER — GABAPENTIN 100 MG PO CAPS: ORAL_CAPSULE | ORAL | 3 refills | Status: DC

## 2018-07-30 MED ORDER — CYCLOBENZAPRINE HCL 5 MG PO TABS: 5.0000 mg | ORAL_TABLET | Freq: Three times a day (TID) | ORAL | 1 refills | Status: DC | PRN

## 2018-07-30 NOTE — Telephone Encounter (Signed)
Patient notified and verbalized understanding.  Appreciative of the call back.

## 2018-07-30 NOTE — Telephone Encounter (Signed)
Noted.  I reviewed the tracing.  Computer interpretation reports anterior infarct based on decreased precordial R waves, but this is most likely due to a differential in lead placement in comparison to the previous tracing from December 2019.  It does not necessarily suggest a definitive infarct.  She had a stress test last February that was low risk.  Unless she has had interval development of recurring angina symptoms, I would not necessarily pursue any further cardiac testing at this time.  She should keep her regular follow-up.

## 2018-07-30 NOTE — Telephone Encounter (Signed)
Fwd for provider review.  EKG in epic.

## 2018-07-30 NOTE — Patient Instructions (Signed)
You had labs performed today.  You will be contacted with the results of the labs once they are available, usually in the next 3 business days for routine lab work.  If you had a pap smear or biopsy performed, expect to be contacted in about 7-10 days.  I will forward your EKG to your heart specialist.    I have added lowest dose gabapentin to your regimen for your foot/ nerve pain.  This can help with low back pain as well.  Take 1 capsule at bedtime.  If this relieves your pain and you don't have side effects, you can stick with that dose.  Otherwise, increase to 2 capsules at bedtime in 1 week.  This can cause sleepiness, so make sure to take at bedtime.  We also talked about Voltaren gel to use in place of the Naproxen.  You can use this on multiple joints if needed.  If too costly through insurance, get over the counter.  Sciatica  Sciatica is pain, numbness, weakness, or tingling along your sciatic nerve. The sciatic nerve starts in the lower back and goes down the back of each leg. Sciatica happens when this nerve is pinched or has pressure put on it. Sciatica usually goes away on its own or with treatment. Sometimes, sciatica may keep coming back (recur). Follow these instructions at home: Medicines  Take over-the-counter and prescription medicines only as told by your doctor.  Do not drive or use heavy machinery while taking prescription pain medicine. Managing pain  If directed, put ice on the affected area. ? Put ice in a plastic bag. ? Place a towel between your skin and the bag. ? Leave the ice on for 20 minutes, 2-3 times a day.  After icing, apply heat to the affected area before you exercise or as often as told by your doctor. Use the heat source that your doctor tells you to use, such as a moist heat pack or a heating pad. ? Place a towel between your skin and the heat source. ? Leave the heat on for 20-30 minutes. ? Remove the heat if your skin turns bright red. This is  especially important if you are unable to feel pain, heat, or cold. You may have a greater risk of getting burned. Activity  Return to your normal activities as told by your doctor. Ask your doctor what activities are safe for you. ? Avoid activities that make your sciatica worse.  Take short rests during the day. Rest in a lying or standing position. This is usually better than sitting to rest. ? When you rest for a long time, do some physical activity or stretching between periods of rest. ? Avoid sitting for a long time without moving. Get up and move around at least one time each hour.  Exercise and stretch regularly, as told by your doctor.  Do not lift anything that is heavier than 10 lb (4.5 kg) while you have symptoms of sciatica. ? Avoid lifting heavy things even when you do not have symptoms. ? Avoid lifting heavy things over and over.  When you lift objects, always lift in a way that is safe for your body. To do this, you should: ? Bend your knees. ? Keep the object close to your body. ? Avoid twisting. General instructions  Use good posture. ? Avoid leaning forward when you are sitting. ? Avoid hunching over when you are standing.  Stay at a healthy weight.  Wear comfortable shoes that support  your feet. Avoid wearing high heels.  Avoid sleeping on a mattress that is too soft or too hard. You might have less pain if you sleep on a mattress that is firm enough to support your back.  Keep all follow-up visits as told by your doctor. This is important. Contact a doctor if:  You have pain that: ? Wakes you up when you are sleeping. ? Gets worse when you lie down. ? Is worse than the pain you have had in the past. ? Lasts longer than 4 weeks.  You lose weight for without trying. Get help right away if:  You cannot control when you pee (urinate) or poop (have a bowel movement).  You have weakness in any of these areas and it gets worse. ? Lower back. ? Lower  belly (pelvis). ? Butt (buttocks). ? Legs.  You have redness or swelling of your back.  You have a burning feeling when you pee. This information is not intended to replace advice given to you by your health care provider. Make sure you discuss any questions you have with your health care provider. Document Released: 11/09/2007 Document Revised: 07/08/2015 Document Reviewed: 10/09/2014 Elsevier Interactive Patient Education  2019 [employer].

## 2018-07-30 NOTE — Telephone Encounter (Signed)
Patient called stating that she had her physical today with [place]. She states that she was given a copy of her EKG. She states that it reads ? MI. Patient is concerned that she has had a heart attack.

## 2018-07-30 NOTE — Progress Notes (Signed)
[name] [name] is a 68 y.o. female presents to office today for annual physical exam examination.    Concerns today include: 1.  Chronic back pain/joint pain Patient reports quite a bit of chronic back pain that radiates down the left foot.  She reports a burning sensation in her heels.  She does have PRN tramadol and does not need refills of this.  She uses this only in emergencies.  She also has naproxen but tries to avoid this given history of CAD.  Predominantly she relies on Tylenol but this does not always help her joint pain.  She describes joint pain in the shoulders, hands and low back.  2.  Sinusitis Patient reports quite a bit of improvement in her chronic sinusitis with addition of Astelin and use of Claritin.  She does occasionally feel hoarse but otherwise is doing well.  3.  Jaw pain Patient reports quite a bit of jaw pain that is been ongoing for many years.  She notes that she was informed that she may need surgical intervention as a child for her child because of a deformity but because she was asymptomatic at that time no surgery was pursued.  She has had quite a bit of oral issues requiring frequent tooth extractions.  She is requesting a referral to an oral surgeon for further evaluation and management.  Last dental exam: regularly Last colonoscopy: UTD Last mammogram: needs  Past Medical History:  Diagnosis Date   Allergy    Anxiety    Arthritis    CAD (coronary artery disease), native coronary artery - s/p CABG 2013 01/01/2018   Depression    Essential hypertension    GERD (gastroesophageal reflux disease)    Hemorrhoids    IBS (irritable bowel syndrome)    Renal cyst, left    Sleep apnea    Social History   Socioeconomic History   Marital status: Divorced    Spouse name: single   Number of children: Not on file   Years of education: Not on file   Highest education level: Not on file  Occupational History   Occupation: Retired   Child psychotherapist strain: Not on file   Food insecurity    Worry: Not on file    Inability: Not on file   Transportation needs    Medical: Not on file    Non-medical: Not on file  Tobacco Use   Smoking status: Former Smoker    Types: Cigarettes    Quit date: 05/23/2006    Years since quitting: 12.1   Smokeless tobacco: Never Used  Substance and Sexual Activity   Alcohol use: Yes    Alcohol/week: 0.0 standard drinks    Comment: rare glass of wine   Drug use: No   Sexual activity: Not Currently    Birth control/protection: Post-menopausal  Lifestyle   Physical activity    Days per week: Not on file    Minutes per session: Not on file   Stress: Not on file  Relationships   Social connections    Talks on phone: Not on file    Gets together: Not on file    Attends religious service: Not on file    Active member of club or organization: Not on file    Attends meetings of clubs or organizations: Not on file    Relationship status: Not on file   Intimate partner violence    Fear of current or ex partner: Not on file  Emotionally abused: Not on file    Physically abused: Not on file    Forced sexual activity: Not on file  Other Topics Concern   Not on file  Social History Narrative   Not on file   Past Surgical History:  Procedure Laterality Date   ABDOMINAL HYSTERECTOMY  2003   COLONOSCOPY     CORONARY ARTERY BYPASS GRAFT  05/26/2011   Procedure: CORONARY ARTERY BYPASS GRAFTING (CABG);  Surgeon: Ivin Poot, MD;  Location: [place];  Service: Open Heart Surgery;  Laterality: N/A;  Coronary Artery Bypass Graft on Pump times two utlizing left internal mammary artery and right greater saphenous vein harvested endoscopically    LEFT HEART CATHETERIZATION WITH CORONARY ANGIOGRAM N/A 05/24/2011   Procedure: LEFT HEART CATHETERIZATION WITH CORONARY ANGIOGRAM;  Surgeon: Leonie Man, MD;  Location: [facility] CATH LAB;  Service: Cardiovascular;  Laterality:  N/A;   TONSILLECTOMY  1962   TUBAL LIGATION     Family History  Problem Relation Age of Onset   Stroke Mother    Diabetes Mother    Heart failure Mother    Alcohol abuse Mother    Mental illness Mother    Drug abuse Sister    Cancer Brother    Alcohol abuse Sister    Lung cancer Maternal Grandmother    Heart disease Maternal Grandfather    Colon cancer Neg Hx    Breast cancer Neg Hx    Esophageal cancer Neg Hx    Rectal cancer Neg Hx    Stomach cancer Neg Hx     Current Outpatient Medications:    albuterol (PROVENTIL HFA;VENTOLIN HFA) 108 (90 Base) MCG/ACT inhaler, Inhale 2 puffs into the lungs every 6 (six) hours as needed for wheezing or shortness of breath., Disp: 2 Inhaler, Rfl: 0   aspirin 81 MG tablet, Take 81 mg by mouth daily., Disp: , Rfl:    atorvastatin (LIPITOR) 20 MG tablet, Take 1 tablet (20 mg total) by mouth daily., Disp: 90 tablet, Rfl: 3   azelastine (ASTELIN) 0.1 % nasal spray, Place 1 spray into both nostrils 2 (two) times daily., Disp: 30 mL, Rfl: 12   conjugated estrogens (PREMARIN) vaginal cream, Place 0.5 Applicatorfuls vaginally 3 (three) times a week. As needed for vaginal symptoms, Disp: 30 g, Rfl: 5   fluticasone (FLONASE) 50 MCG/ACT nasal spray, Place 2 sprays into both nostrils daily., Disp: 16 g, Rfl: 6   loratadine (CLARITIN) 10 MG tablet, Take 1 tablet (10 mg total) by mouth daily., Disp: 30 tablet, Rfl: 11   losartan (COZAAR) 25 MG tablet, TAKE 0.5 TABLETS (12.5 MG TOTAL) BY MOUTH DAILY. 12/26/17 NEW, Disp: 135 tablet, Rfl: 1   spironolactone (ALDACTONE) 25 MG tablet, TAKE 1 TABLET BY MOUTH EVERY DAY, Disp: 90 tablet, Rfl: 0   nitroGLYCERIN (NITROSTAT) 0.4 MG SL tablet, Place 1 tablet (0.4 mg total) under the tongue every 5 (five) minutes as needed for chest pain., Disp: 75 tablet, Rfl: 1   pantoprazole (PROTONIX) 40 MG tablet, Take 40 mg by mouth daily., Disp: , Rfl:   Allergies  Allergen Reactions   Amlodipine  Swelling   Cetirizine & Related Other (See Comments)    Extreme Drowsiness - "knocks me loopy for 2 days"   Latex Rash   Levofloxacin Nausea And Vomiting   Lisinopril Other (See Comments)    Excessive mucus production   Prednisone Other (See Comments)    High dose - hallucinations   Tape Rash     ROS:  Review of Systems Constitutional: negative Eyes: positive for contacts/glasses Ears, nose, mouth, throat, and face: positive for voice change and allergies Respiratory: negative Cardiovascular: negative Gastrointestinal: negative Genitourinary:negative Integument/breast: negative Hematologic/lymphatic: negative Musculoskeletal:jaw pain; chronic back pain Neurological: positive for foot pain/ burning Behavioral/Psych: positive for anxiety and depression Endocrine: negative Allergic/Immunologic: positive for allergies, rhinorrhea    Physical exam BP (!) 156/76    Pulse (!) 53    Temp 98 F (36.7 C) (Oral)    Ht '5\' 5"'$  (1.651 m)    Wt 164 lb (74.4 kg)    BMI 27.29 kg/m  General appearance: alert, cooperative and appears stated age Head: Normocephalic, without obvious abnormality, atraumatic Eyes: negative findings: lids and lashes normal, conjunctivae and sclerae normal, corneas clear and pupils equal, round, reactive to light and accomodation Ears: normal TM's and external ear canals both ears Nose: Nares normal. Septum midline. Mucosa normal. No drainage or sinus tenderness. Throat: lips, mucosa, and tongue normal; teeth and gums normal Neck: no adenopathy, no carotid bruit, no JVD, supple, symmetrical, trachea midline and thyroid not enlarged, symmetric, no tenderness/mass/nodules Back: symmetric, no curvature. ROM normal. No CVA tenderness., some pain with laying flat Lungs: clear to auscultation bilaterally Heart: regular rate and rhythm, S1, S2 normal, no murmur, click, rub or gallop Abdomen: soft, non-tender; bowel sounds normal; no masses,  no  organomegaly Extremities: extremities normal, atraumatic, no cyanosis or edema Pulses: 2+ and symmetric Skin: Skin color, texture, turgor normal. No rashes or lesions Lymph nodes: Cervical, supraclavicular, and axillary nodes normal. Neurologic: Alert and oriented X 3, normal strength and tone. Normal symmetric reflexes. Normal coordination and gait Psych: Mood labile.  Patient intermittently tearful. Refused PHQ today  Assessment/ Plan: [name] here for annual physical exam.   1. Coronary artery disease involving native coronary artery of native heart without angina pectoris EKG with sinus bradycardia.  Sent to Dr Domenic Polite. Copy given to patient. - EKG 12-Lead - Lipid Panel - CMP14+EGFR - TSH  2. Essential hypertension Controlled. - CMP14+EGFR  3. Hyperlipidemia LDL goal <70 - Lipid Panel - CMP14+EGFR - TSH  4. Chronic bilateral low back pain with left-sided sciatica Start Neurontin '100mg'$  qhs.  Increase if needed. Titration schedule provided - cyclobenzaprine (FLEXERIL) 5 MG tablet; Take 1 tablet (5 mg total) by mouth 3 (three) times daily as needed for muscle spasms.  Dispense: 30 tablet; Refill: 1 - gabapentin (NEURONTIN) 100 MG capsule; Take 1 capsule (100 mg total) by mouth at bedtime for 7 days, THEN 2 capsules (200 mg total) at bedtime for 7 days, THEN 3 capsules (300 mg total) at bedtime.  Dispense: 90 capsule; Refill: 3  5. Chronic allergic rhinitis Controlled.  Encouraged sinuses rinses prior to use of Astelin. - CBC  6. Primary osteoarthritis involving multiple joints Add Voltaren getl - diclofenac sodium (VOLTAREN) 1 % GEL; Apply 4 g topically 4 (four) times daily.  Dispense: 400 g; Refill: 1  7. Jaw pain Referral placed - Ambulatory referral to Oral Maxillofacial Surgery  Patient to follow up in 1 year for annual exam or sooner if needed.  Eriana Suliman M. Lajuana Ripple, DO

## 2018-07-31 LAB — LIPID PANEL
Chol/HDL Ratio: 2.5 ratio (ref 0.0–4.4)
Cholesterol, Total: 160 mg/dL (ref 100–199)
HDL: 64 mg/dL (ref >39)
LDL Calculated: 79 mg/dL (ref 0–99)
Triglycerides: 86 mg/dL (ref 0–149)
VLDL Cholesterol Cal: 17 mg/dL (ref 5–40)

## 2018-07-31 LAB — CMP14+EGFR
ALT: 15 IU/L (ref 0–32)
AST: 17 IU/L (ref 0–40)
Albumin/Globulin Ratio: 2.3 — ABNORMAL HIGH (ref 1.2–2.2)
Albumin: 4.4 g/dL (ref 3.8–4.8)
Alkaline Phosphatase: 77 IU/L (ref 39–117)
BUN/Creatinine Ratio: 20 (ref 12–28)
BUN: 16 mg/dL (ref 8–27)
Bilirubin Total: 0.3 mg/dL (ref 0.0–1.2)
CO2: 23 mmol/L (ref 20–29)
Calcium: 9.9 mg/dL (ref 8.7–10.3)
Chloride: 105 mmol/L (ref 96–106)
Creatinine, Ser: 0.79 mg/dL (ref 0.57–1.00)
GFR calc Af Amer: 90 mL/min/{1.73_m2} (ref >59)
GFR calc non Af Amer: 78 mL/min/{1.73_m2} (ref >59)
Globulin, Total: 1.9 g/dL (ref 1.5–4.5)
Glucose: 91 mg/dL (ref 65–99)
Potassium: 4.3 mmol/L (ref 3.5–5.2)
Sodium: 143 mmol/L (ref 134–144)
Total Protein: 6.3 g/dL (ref 6.0–8.5)

## 2018-07-31 LAB — CBC
Hematocrit: 42.9 % (ref 34.0–46.6)
Hemoglobin: 15 g/dL (ref 11.1–15.9)
MCH: 29.5 pg (ref 26.6–33.0)
MCHC: 35 g/dL (ref 31.5–35.7)
MCV: 84 fL (ref 79–97)
Platelets: 423 10*3/uL (ref 150–450)
RBC: 5.09 x10E6/uL (ref 3.77–5.28)
RDW: 13.3 % (ref 11.7–15.4)
WBC: 7.4 10*3/uL (ref 3.4–10.8)

## 2018-07-31 LAB — TSH: TSH: 1.72 u[IU]/mL (ref 0.450–4.500)

## 2018-08-12 ENCOUNTER — Telehealth: Payer: Self-pay | Admitting: Family Medicine

## 2018-08-12 NOTE — Telephone Encounter (Signed)
Aware of normal results.

## 2018-08-19 DIAGNOSIS — R69 Illness, unspecified: Secondary | ICD-10-CM | POA: Diagnosis not present

## 2018-09-13 ENCOUNTER — Telehealth: Payer: Self-pay | Admitting: *Deleted

## 2018-09-13 ENCOUNTER — Other Ambulatory Visit: Payer: Self-pay | Admitting: Physician Assistant

## 2018-09-13 ENCOUNTER — Telehealth: Payer: Self-pay | Admitting: Family Medicine

## 2018-09-13 ENCOUNTER — Telehealth: Payer: Self-pay | Admitting: Cardiology

## 2018-09-13 NOTE — Telephone Encounter (Signed)
Make appointment with Dr Darnell Level and I am sending medication

## 2018-09-13 NOTE — Telephone Encounter (Signed)
Can increase to 50 mg

## 2018-09-13 NOTE — Telephone Encounter (Signed)
Patient aware of Dr. Myles Gip recommendations.  Patient would like to have different medication sent to pharmacy

## 2018-09-13 NOTE — Telephone Encounter (Signed)
Pt wanted to make Dr Domenic Polite aware that Dr Lajuana Ripple increase losartan 25 mg daily and stopped Aldactone - pt says she thinks she was having a reaction of voice being hoarse and scratchy throat since starting Aldactone - says she will monitor BP/HR at home and has f/u with pcp end of August for f/u and scheduled with Dr Domenic Polite in September for 1 yr f/u

## 2018-09-13 NOTE — Telephone Encounter (Signed)
Patient called stating that she received a call from her PCP.[name] Kitchenand was told to stop taking her BP medication.

## 2018-09-13 NOTE — Telephone Encounter (Signed)
Patient called stating that she thinks she is having side effects of her BP medication. States that she is staying hoarse. States that at night her throat feels like it is going to close.

## 2018-09-13 NOTE — Telephone Encounter (Signed)
Bp meds prescribed by cardiologist.  Patient will call cardiologist to discuss

## 2018-09-13 NOTE — Telephone Encounter (Signed)
Patient aware.

## 2018-09-13 NOTE — Telephone Encounter (Signed)
25 mg please And forward ro Dr. Lajuana Ripple

## 2018-09-13 NOTE — Telephone Encounter (Signed)
Patient states that she is having sinus drainage, hoarseness and feels like she is having swelling in throat at night due to spironolactone.  She is using flonase and claritin with no relief.  Patient believes this is a side effect of spironolactone and would like to have this medication switched

## 2018-09-13 NOTE — Telephone Encounter (Signed)
I met her for the first time in September 2019.  Several office phone call since then but no office visit.  She was on Aldactone predating my assessment of her.  If she is concerned about side effects, only way to know that would be to stop the medication and see if she feels better.  This would necessitate checking blood pressure regularly however to see if something else needs to be substituted.  Her PCP is very good and should be able to help guide her in terms of other options for blood pressure control.

## 2018-09-13 NOTE — Telephone Encounter (Signed)
Has she ever taken any other medication for BP?  Will go ahead and stop the spironolactone and replace  Have quick follow up with Dr. Lajuana Ripple

## 2018-09-13 NOTE — Telephone Encounter (Signed)
Patient currently only taking 12.5mg  of losartan.  Advised to increase to 25mg  and to keep a check on BPs

## 2018-09-13 NOTE — Telephone Encounter (Signed)
Is she still on losartan 25 mg

## 2018-09-13 NOTE — Telephone Encounter (Signed)
Patient currently take 12.5mg  losartan daily.  Do you still want to increase to 50mg   or just increase to 25mg ?

## 2018-09-18 DIAGNOSIS — J3489 Other specified disorders of nose and nasal sinuses: Secondary | ICD-10-CM | POA: Diagnosis not present

## 2018-10-11 ENCOUNTER — Ambulatory Visit: Payer: Medicare HMO | Admitting: Family Medicine

## 2018-10-15 ENCOUNTER — Other Ambulatory Visit: Payer: Self-pay | Admitting: Cardiology

## 2018-10-24 DIAGNOSIS — Z299 Encounter for prophylactic measures, unspecified: Secondary | ICD-10-CM | POA: Diagnosis not present

## 2018-10-24 DIAGNOSIS — R350 Frequency of micturition: Secondary | ICD-10-CM | POA: Diagnosis not present

## 2018-10-24 DIAGNOSIS — Z87891 Personal history of nicotine dependence: Secondary | ICD-10-CM | POA: Diagnosis not present

## 2018-10-24 DIAGNOSIS — I10 Essential (primary) hypertension: Secondary | ICD-10-CM | POA: Diagnosis not present

## 2018-10-24 DIAGNOSIS — T7840XA Allergy, unspecified, initial encounter: Secondary | ICD-10-CM | POA: Diagnosis not present

## 2018-10-24 DIAGNOSIS — Z6829 Body mass index (BMI) 29.0-29.9, adult: Secondary | ICD-10-CM | POA: Diagnosis not present

## 2018-10-24 DIAGNOSIS — K219 Gastro-esophageal reflux disease without esophagitis: Secondary | ICD-10-CM | POA: Diagnosis not present

## 2018-10-28 ENCOUNTER — Other Ambulatory Visit: Payer: Self-pay | Admitting: Nurse Practitioner

## 2018-10-28 ENCOUNTER — Telehealth: Payer: Self-pay | Admitting: Cardiology

## 2018-10-28 DIAGNOSIS — Z1231 Encounter for screening mammogram for malignant neoplasm of breast: Secondary | ICD-10-CM

## 2018-10-28 NOTE — Telephone Encounter (Signed)
patient asking letter stating that she needs to work out to be able to do her walking at the [facility]

## 2018-10-28 NOTE — Telephone Encounter (Signed)
Contacted pt. No answer. Left message to call back [place] office at [phone]

## 2018-10-29 ENCOUNTER — Ambulatory Visit: Payer: Medicare HMO | Admitting: Cardiology

## 2018-10-29 NOTE — Telephone Encounter (Signed)
Actually, with changes in the most recent COVID-19 guidelines based on the last Governor's announcement, I do not believe that these letters are actually necessary.

## 2018-10-30 NOTE — Telephone Encounter (Signed)
Patient called to let someone know that she no longer needs  Letter for [facility]

## 2018-10-31 DIAGNOSIS — K219 Gastro-esophageal reflux disease without esophagitis: Secondary | ICD-10-CM | POA: Diagnosis not present

## 2018-10-31 DIAGNOSIS — T7840XA Allergy, unspecified, initial encounter: Secondary | ICD-10-CM | POA: Diagnosis not present

## 2018-11-04 ENCOUNTER — Other Ambulatory Visit: Payer: Self-pay

## 2018-11-04 ENCOUNTER — Encounter (HOSPITAL_COMMUNITY): Payer: Self-pay

## 2018-11-04 ENCOUNTER — Telehealth: Payer: Self-pay | Admitting: *Deleted

## 2018-11-04 ENCOUNTER — Emergency Department (HOSPITAL_COMMUNITY)
Admission: EM | Admit: 2018-11-04 | Discharge: 2018-11-04 | Disposition: A | Discharge status: Home / Self Care | Payer: Medicare HMO | Attending: Emergency Medicine | Admitting: Emergency Medicine

## 2018-11-04 DIAGNOSIS — R221 Localized swelling, mass and lump, neck: Secondary | ICD-10-CM | POA: Diagnosis present

## 2018-11-04 DIAGNOSIS — R69 Illness, unspecified: Secondary | ICD-10-CM | POA: Diagnosis not present

## 2018-11-04 DIAGNOSIS — Z91040 Latex allergy status: Secondary | ICD-10-CM | POA: Diagnosis not present

## 2018-11-04 DIAGNOSIS — I10 Essential (primary) hypertension: Secondary | ICD-10-CM | POA: Insufficient documentation

## 2018-11-04 DIAGNOSIS — I2510 Atherosclerotic heart disease of native coronary artery without angina pectoris: Secondary | ICD-10-CM | POA: Insufficient documentation

## 2018-11-04 DIAGNOSIS — Z951 Presence of aortocoronary bypass graft: Secondary | ICD-10-CM | POA: Insufficient documentation

## 2018-11-04 DIAGNOSIS — Z79899 Other long term (current) drug therapy: Secondary | ICD-10-CM | POA: Insufficient documentation

## 2018-11-04 DIAGNOSIS — Z7982 Long term (current) use of aspirin: Secondary | ICD-10-CM | POA: Insufficient documentation

## 2018-11-04 DIAGNOSIS — T7840XA Allergy, unspecified, initial encounter: Secondary | ICD-10-CM

## 2018-11-04 DIAGNOSIS — Z87891 Personal history of nicotine dependence: Secondary | ICD-10-CM | POA: Insufficient documentation

## 2018-11-04 LAB — CBC WITH DIFFERENTIAL/PLATELET
Abs Immature Granulocytes: 0.05 10*3/uL (ref 0.00–0.07)
Basophils Absolute: 0.1 10*3/uL (ref 0.0–0.1)
Basophils Relative: 1 %
Eosinophils Absolute: 0.3 10*3/uL (ref 0.0–0.5)
Eosinophils Relative: 2 %
HCT: 48.4 % — ABNORMAL HIGH (ref 36.0–46.0)
Hemoglobin: 15.4 g/dL — ABNORMAL HIGH (ref 12.0–15.0)
Immature Granulocytes: 0 %
Lymphocytes Relative: 32 %
Lymphs Abs: 3.7 10*3/uL (ref 0.7–4.0)
MCH: 27.9 pg (ref 26.0–34.0)
MCHC: 31.8 g/dL (ref 30.0–36.0)
MCV: 87.7 fL (ref 80.0–100.0)
Monocytes Absolute: 0.9 10*3/uL (ref 0.1–1.0)
Monocytes Relative: 8 %
Neutro Abs: 6.4 10*3/uL (ref 1.7–7.7)
Neutrophils Relative %: 57 %
Platelets: 466 10*3/uL — ABNORMAL HIGH (ref 150–400)
RBC: 5.52 MIL/uL — ABNORMAL HIGH (ref 3.87–5.11)
RDW: 13.6 % (ref 11.5–15.5)
WBC: 11.4 10*3/uL — ABNORMAL HIGH (ref 4.0–10.5)
nRBC: 0 % (ref 0.0–0.2)

## 2018-11-04 LAB — COMPREHENSIVE METABOLIC PANEL
ALT: 21 U/L (ref 0–44)
AST: 16 U/L (ref 15–41)
Albumin: 4.3 g/dL (ref 3.5–5.0)
Alkaline Phosphatase: 90 U/L (ref 38–126)
Anion gap: 8 (ref 5–15)
BUN: 14 mg/dL (ref 8–23)
CO2: 28 mmol/L (ref 22–32)
Calcium: 9.4 mg/dL (ref 8.9–10.3)
Chloride: 104 mmol/L (ref 98–111)
Creatinine, Ser: 0.82 mg/dL (ref 0.44–1.00)
GFR calc Af Amer: >60 mL/min (ref >60)
GFR calc non Af Amer: >60 mL/min (ref >60)
Glucose, Bld: 91 mg/dL (ref 70–99)
Potassium: 3.9 mmol/L (ref 3.5–5.1)
Sodium: 140 mmol/L (ref 135–145)
Total Bilirubin: 0.5 mg/dL (ref 0.3–1.2)
Total Protein: 7.4 g/dL (ref 6.5–8.1)

## 2018-11-04 MED ORDER — PREDNISONE 50 MG PO TABS: 60.0000 mg | ORAL_TABLET | Freq: Once | ORAL | Status: DC

## 2018-11-04 MED ORDER — EPINEPHRINE 0.3 MG/0.3ML IJ SOAJ
INTRAMUSCULAR | Status: AC
  Filled 2018-11-04: qty 0.3

## 2018-11-04 MED ORDER — FAMOTIDINE 20 MG PO TABS: 20.0000 mg | ORAL_TABLET | Freq: Two times a day (BID) | ORAL | 0 refills | Status: DC

## 2018-11-04 NOTE — Telephone Encounter (Signed)
Reports starting spironolactone one week ago after stopping it for one month. Reports, Friday, throat swollen, no sob, went to Urgent Care 163/? O2 Sat 97%. Given shot of methylprednisolone in hip and her symptoms resolved. Patient feels that she is allergic to spironolactone. Reports feeling a lump in throat now like her throat is swelling while on the phone but says she is breathing okay. Denies chest pain, sob or dizziness. Reports that she has continued to take spironolactone. Advised the only way to determine if spironolactone is causing her problem is to come off of it. Advised that she needed to go to Urgent Care now or ED for evaluation and that she needed to have them advise her on whether or not she needed to continue this medication. Virtual appt scheduled with Domenic Polite for 11/21/18. Verbalized understanding of plan.  Patient verbally consented for telehealth visits with [facility] and understands that her insurance company will be billed for the encounter.   Aware to have vitals available

## 2018-11-04 NOTE — Discharge Instructions (Addendum)
Take 1 Claritin daily and follow-up with the ENT doctor named Dr. Wilburn Cornelia

## 2018-11-04 NOTE — ED Triage Notes (Signed)
Pt reports she was picking up her grand daughter from school and started feeling like throat was swelling.  Pt says she went to urgent care this past Friday and was given a shot.  Pt says feels like has a lump in her throat.  Pt unsure what she is allergic to.  Pt says is having some sob.  Used an inhaler around 1445.  Pt took a benadryl at 1545.
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
## 2018-11-04 NOTE — ED Notes (Signed)
Pt states she feels a little better than she did, her throat is only a little scratchy now.

## 2018-11-07 ENCOUNTER — Encounter: Payer: Self-pay | Admitting: Cardiology

## 2018-11-07 ENCOUNTER — Other Ambulatory Visit: Payer: Self-pay

## 2018-11-07 ENCOUNTER — Ambulatory Visit: Payer: Medicare HMO | Admitting: Cardiology

## 2018-11-07 DIAGNOSIS — E785 Hyperlipidemia, unspecified: Secondary | ICD-10-CM

## 2018-11-07 DIAGNOSIS — T50905A Adverse effect of unspecified drugs, medicaments and biological substances, initial encounter: Secondary | ICD-10-CM | POA: Diagnosis not present

## 2018-11-07 DIAGNOSIS — K219 Gastro-esophageal reflux disease without esophagitis: Secondary | ICD-10-CM

## 2018-11-07 DIAGNOSIS — Z951 Presence of aortocoronary bypass graft: Secondary | ICD-10-CM | POA: Diagnosis not present

## 2018-11-07 DIAGNOSIS — I10 Essential (primary) hypertension: Secondary | ICD-10-CM | POA: Diagnosis not present

## 2018-11-07 MED ORDER — DIPHENHYDRAMINE HCL 25 MG PO CAPS: 50.0000 mg | ORAL_CAPSULE | Freq: Once | ORAL | Status: DC

## 2018-11-07 NOTE — Assessment & Plan Note (Signed)
Pt thinks she is having an allergic reaction to Losartan

## 2018-11-07 NOTE — Progress Notes (Addendum)
Cardiology Office Note:    Date:  11/07/2018   ID:  [name] [name], DOB [birth year], MRN [id number]  PCP:  Arsenio Katz, NP  Cardiologist:  Rozann Lesches, MD  Electrophysiologist:  None   Referring MD: Janora Norlander, DO   CC: throat swelling  History of Present Illness:    [name] [name] is a 68 y.o. female with a hx of CAD and hypertension.  She had CABG x2 in April 2013.  She had an LIMA to LAD and SVG to OM.  This was after an abnormal nuclear study, per records she never really had classic angina.  Echocardiogram in October 2018 was essentially normal, nuclear stress test in February 2019 was low risk.  Other medical issues include hypertension, dyslipidemia, and a history of GERD.  Her endoscopy in January 2020 was essentially normal.  The patient had been followed by Dr. Claiborne Billings but she lives in [place] and transferred her care to Dr. Domenic Polite.  She is in the office today because of complaints of "swelling in my throat".  She thinks she is allergic to losartan.  Her history is a little confusing, she has had symptoms of rhinorrhea going back to February.  She was put on antibiotics and steroids and nasal sprays.  She now takes Claritin.  She also had a work-up by Dr. Henrene Pastor which included an endoscopy, this was essentially normal.  A month ago the patient stopped her spironolactone for a couple weeks to see if this would help her, there was no change.  She has resumed it. She went to the ED 11/04/2018 for these symptoms and was prescribed Pepcid. In the office today she says she feels like her throat is swollen although this was not apparent on exam.  She asked for a Benadryl which she tells me she has been taking with some relief.  She denies any other usual shortness of breath or rash.  She says she is able to walk 30 minutes on the treadmill although she admits when she gets off she is a little short of breath.  Past Medical History:  Diagnosis Date  . Allergy   . Anxiety   . Arthritis    . CAD (coronary artery disease), native coronary artery - s/p CABG 2013 01/01/2018  . Depression   . Essential hypertension   . GERD (gastroesophageal reflux disease)   . Hemorrhoids   . IBS (irritable bowel syndrome)   . Renal cyst, left   . Sleep apnea     Past Surgical History:  Procedure Laterality Date  . ABDOMINAL HYSTERECTOMY  2003  . COLONOSCOPY    . CORONARY ARTERY BYPASS GRAFT  05/26/2011   Procedure: CORONARY ARTERY BYPASS GRAFTING (CABG);  Surgeon: Ivin Poot, MD;  Location: [place];  Service: Open Heart Surgery;  Laterality: N/A;  Coronary Artery Bypass Graft on Pump times two utlizing left internal mammary artery and right greater saphenous vein harvested endoscopically   . LEFT HEART CATHETERIZATION WITH CORONARY ANGIOGRAM N/A 05/24/2011   Procedure: LEFT HEART CATHETERIZATION WITH CORONARY ANGIOGRAM;  Surgeon: Leonie Man, MD;  Location: [facility] CATH LAB;  Service: Cardiovascular;  Laterality: N/A;  . TONSILLECTOMY  1962  . TUBAL LIGATION      Current Medications: Current Meds  Medication Sig  . albuterol (PROVENTIL HFA;VENTOLIN HFA) 108 (90 Base) MCG/ACT inhaler Inhale 2 puffs into the lungs every 6 (six) hours as needed for wheezing or shortness of breath.  Marland Kitchen aspirin 81 MG tablet Take 81 mg  by mouth daily.  Marland Kitchen atorvastatin (LIPITOR) 20 MG tablet Take 1 tablet (20 mg total) by mouth daily.  Marland Kitchen conjugated estrogens (PREMARIN) vaginal cream Place 0.5 Applicatorfuls vaginally 3 (three) times a week. As needed for vaginal symptoms  . diclofenac sodium (VOLTAREN) 1 % GEL Apply 4 g topically 4 (four) times daily.  . famotidine (PEPCID) 20 MG tablet Take 1 tablet (20 mg total) by mouth 2 (two) times daily.  Marland Kitchen loratadine (CLARITIN) 10 MG tablet Take 1 tablet (10 mg total) by mouth daily.  . pantoprazole (PROTONIX) 40 MG tablet Take 40 mg by mouth daily.  Marland Kitchen spironolactone (ALDACTONE) 25 MG tablet TAKE 1 TABLET BY MOUTH EVERY DAY  . traMADol (ULTRAM) 50 MG tablet Take 50 mg  by mouth 2 (two) times daily as needed.     Allergies:   Amlodipine, Cetirizine & related, Latex, Levofloxacin, Lisinopril, Prednisone, and Tape   Social History   Socioeconomic History  . Marital status: Divorced    Spouse name: single  . Number of children: Not on file  . Years of education: Not on file  . Highest education level: Not on file  Occupational History  . Occupation: Retired   Scientific laboratory technician  . Financial resource strain: Not on file  . Food insecurity    Worry: Not on file    Inability: Not on file  . Transportation needs    Medical: Not on file    Non-medical: Not on file  Tobacco Use  . Smoking status: Former Smoker    Types: Cigarettes    Quit date: 05/23/2006    Years since quitting: 12.4  . Smokeless tobacco: Never Used  Substance and Sexual Activity  . Alcohol use: Yes    Alcohol/week: 0.0 standard drinks    Comment: rare glass of wine  . Drug use: No  . Sexual activity: Not Currently    Birth control/protection: Post-menopausal  Lifestyle  . Physical activity    Days per week: Not on file    Minutes per session: Not on file  . Stress: Not on file  Relationships  . Social Herbalist on phone: Not on file    Gets together: Not on file    Attends religious service: Not on file    Active member of club or organization: Not on file    Attends meetings of clubs or organizations: Not on file    Relationship status: Not on file  Other Topics Concern  . Not on file  Social History Narrative  . Not on file     Family History: The patient's family history includes Alcohol abuse in her mother and sister; Cancer in her brother; Diabetes in her mother; Drug abuse in her sister; Heart disease in her maternal grandfather; Heart failure in her mother; Lung cancer in her maternal grandmother; Mental illness in her mother; Stroke in her mother. There is no history of Colon cancer, Breast cancer, Esophageal cancer, Rectal cancer, or Stomach  cancer.  ROS:   Please see the history of present illness.    All other systems reviewed and are negative.  EKGs/Labs/Other Studies Reviewed:    The following studies were reviewed today: Myoview Feb 2019 Echo Oct 2018  Recent Labs: 07/30/2018: TSH 1.720 11/04/2018: ALT 21; BUN 14; Creatinine, Ser 0.82; Hemoglobin 15.4; Platelets 466; Potassium 3.9; Sodium 140  Recent Lipid Panel    Component Value Date/Time   CHOL 160 07/30/2018 0948   TRIG 86 07/30/2018 0948   HDL  64 07/30/2018 0948   CHOLHDL 2.5 07/30/2018 0948   CHOLHDL 2.3 11/18/2014 0928   VLDL 15 11/18/2014 0928   LDLCALC 79 07/30/2018 0948    Physical Exam:    VS:  BP (!) 142/78   Pulse (!) 55   Temp (!) 97.5 F (36.4 C)   Ht 5\' 3"  (1.6 m)   Wt 163 lb 11.2 oz (74.3 kg)   SpO2 95%   BMI 29.00 kg/m     Wt Readings from Last 3 Encounters:  11/07/18 163 lb 11.2 oz (74.3 kg)  11/04/18 168 lb (76.2 kg)  07/30/18 164 lb (74.4 kg)     GEN: Well nourished, well developed in no acute distress HEENT: Normal-no obvious swelling or erythema  on ENT exam NECK: No JVD; No carotid bruits LYMPHATICS: No lymphadenopathy CARDIAC: RRR, no murmurs, rubs, gallops RESPIRATORY:  Clear to auscultation without rales, wheezing or rhonchi  ABDOMEN: Soft, non-tender, non-distended MUSCULOSKELETAL:  No edema; No deformity  SKIN: Warm and dry-no rash NEUROLOGIC:  Alert and oriented x 3 PSYCHIATRIC:  Normal affect   ASSESSMENT:    Medication adverse effect Pt thinks she is having an allergic reaction to Losartan  Essential hypertension Repeat B/P by me 124/62  S/P CABG x 2 LIMA-LAD, SVG-OM Aprill 2013 Echo normal LVF Oct 2018 Myoview low risk Feb 2019  Hyperlipidemia LDL goal <70 LDL 79 June 2020  Gastroesophageal reflux disease without esophagitis Normal EGD 02/2018; Dr. Henrene Pastor  PLAN:    Stop Losartan. Monitor B/P at home.  F/U 2-3 weeks.  The patient asked for a Benadryl in the office for her throat.  This was  provided to her.   Addendum:  Pt was contacted shortly after she left the office and told she shouldn't be driving after taking Benadryl.  She understood the reasoning and promised she would not be driving for a few hours as she was planning to spend the morning shopping at [facility].   Medication Adjustments/Labs and Tests Ordered: Current medicines are reviewed at length with the patient today.  Concerns regarding medicines are outlined above.  No orders of the defined types were placed in this encounter.  No orders of the defined types were placed in this encounter.   Patient Instructions  Medication Instructions:  STOP Losartan If you need a refill on your cardiac medications before your next appointment, please call your pharmacy.   Lab work: None  If you have labs (blood work) drawn today and your tests are completely normal, you will receive your results only by: Marland Kitchen MyChart Message (if you have MyChart) OR . A paper copy in the mail If you have any lab test that is abnormal or we need to change your treatment, we will call you to review the results.  Testing/Procedures: None   Follow-Up: At [facility], you and your health needs are our priority.  As part of our continuing mission to provide you with exceptional heart care, we have created designated Provider Care Teams.  These Care Teams include your primary Cardiologist (physician) and Advanced Practice Providers (APPs -  Physician Assistants and Nurse Practitioners) who all work together to provide you with the care you need, when you need it.  . Your physician recommends that you schedule a follow-up appointment in: 2-3 weeks with [name], PA-C  Any Other Special Instructions Will Be Listed Below (If Applicable). CHECK YOUR BLOOD PRESSURE THREE TIMES A WEEK AT DIFFERENT TIMES OF DAY-MONDAYS AFTER BREAKFAST, WEDNESDAYS AFTER LUNCH AND FRIDAYS  IN THE EVENING      Signed, [name], [place]  11/07/2018 8:50 AM     [facility]

## 2018-11-07 NOTE — Assessment & Plan Note (Signed)
LDL 79 June 2020

## 2018-11-07 NOTE — Assessment & Plan Note (Signed)
LIMA-LAD, SVG-OM Aprill 2013 Echo normal LVF Oct 2018 Myoview low risk Feb 2019

## 2018-11-07 NOTE — Assessment & Plan Note (Signed)
Normal EGD 02/2018; Dr. Henrene Pastor

## 2018-11-07 NOTE — Assessment & Plan Note (Signed)
Repeat B/P by me 124/62

## 2018-11-07 NOTE — Patient Instructions (Signed)
Medication Instructions:  STOP Losartan If you need a refill on your cardiac medications before your next appointment, please call your pharmacy.   Lab work: None  If you have labs (blood work) drawn today and your tests are completely normal, you will receive your results only by: Marland Kitchen MyChart Message (if you have MyChart) OR . A paper copy in the mail If you have any lab test that is abnormal or we need to change your treatment, we will call you to review the results.  Testing/Procedures: None   Follow-Up: At [facility], you and your health needs are our priority.  As part of our continuing mission to provide you with exceptional heart care, we have created designated Provider Care Teams.  These Care Teams include your primary Cardiologist (physician) and Advanced Practice Providers (APPs -  Physician Assistants and Nurse Practitioners) who all work together to provide you with the care you need, when you need it.  . Your physician recommends that you schedule a follow-up appointment in: 2-3 weeks with [name] Ransom, PA-C  Any Other Special Instructions Will Be Listed Below (If Applicable). CHECK YOUR BLOOD PRESSURE THREE TIMES A WEEK AT DIFFERENT TIMES OF DAY-MONDAYS AFTER BREAKFAST, WEDNESDAYS AFTER LUNCH AND FRIDAYS IN THE EVENING

## 2018-11-15 ENCOUNTER — Other Ambulatory Visit: Payer: Self-pay | Admitting: Family

## 2018-11-15 DIAGNOSIS — Z2821 Immunization not carried out because of patient refusal: Secondary | ICD-10-CM | POA: Diagnosis not present

## 2018-11-15 DIAGNOSIS — I10 Essential (primary) hypertension: Secondary | ICD-10-CM | POA: Diagnosis not present

## 2018-11-15 DIAGNOSIS — M545 Low back pain: Secondary | ICD-10-CM | POA: Diagnosis not present

## 2018-11-15 DIAGNOSIS — Z299 Encounter for prophylactic measures, unspecified: Secondary | ICD-10-CM | POA: Diagnosis not present

## 2018-11-15 DIAGNOSIS — Z6829 Body mass index (BMI) 29.0-29.9, adult: Secondary | ICD-10-CM | POA: Diagnosis not present

## 2018-11-15 DIAGNOSIS — M25551 Pain in right hip: Secondary | ICD-10-CM | POA: Diagnosis not present

## 2018-11-15 DIAGNOSIS — E7800 Pure hypercholesterolemia, unspecified: Secondary | ICD-10-CM | POA: Diagnosis not present

## 2018-11-21 ENCOUNTER — Other Ambulatory Visit: Payer: Self-pay

## 2018-11-21 ENCOUNTER — Ambulatory Visit: Payer: Medicare HMO | Admitting: Cardiology

## 2018-11-21 ENCOUNTER — Telehealth: Payer: Medicare HMO | Admitting: Cardiology

## 2018-11-21 ENCOUNTER — Emergency Department (HOSPITAL_COMMUNITY)
Admission: EM | Admit: 2018-11-21 | Discharge: 2018-11-21 | Discharge status: Against Medical Advice | Payer: Medicare HMO

## 2018-11-21 DIAGNOSIS — R52 Pain, unspecified: Secondary | ICD-10-CM | POA: Diagnosis not present

## 2018-11-21 DIAGNOSIS — W19XXXA Unspecified fall, initial encounter: Secondary | ICD-10-CM | POA: Diagnosis not present

## 2018-11-22 DIAGNOSIS — M545 Low back pain: Secondary | ICD-10-CM | POA: Diagnosis not present

## 2018-11-25 ENCOUNTER — Ambulatory Visit: Payer: Medicare HMO | Admitting: Physician Assistant

## 2018-11-30 DIAGNOSIS — K121 Other forms of stomatitis: Secondary | ICD-10-CM | POA: Diagnosis not present

## 2018-12-02 ENCOUNTER — Telehealth: Payer: Self-pay | Admitting: Cardiology

## 2018-12-02 NOTE — Telephone Encounter (Signed)
Pt c/o BP issue: STAT if pt c/o blurred vision, one-sided weakness or slurred speech  1. What are your last 5 BP readings? 10/13 149/81 HR 58      10/15 143/88 HR 75      10/16  129/73 HR 53      10/17 150/76 HR 72      10/18 160/85 HR 75              2. Are you having any other symptoms (ex. Dizziness, headache, blurred vision, passed out)? Sunday she had dizziness, headache and was throwing up.   3. What is your BP issue? Concerned with the high readings.

## 2018-12-02 NOTE — Telephone Encounter (Signed)
   Late entry. Confirmed with pt that during her last OV with [name], PA-C on 9/24, pt losartan was d/c and pt was advised to monitor BP at home and then f/u in 2-3 weeks with [name], PA-C. Next appt scheduled for 10/28 with [name], PA-C. Pt states she is already aware. Per pt, she was started on losartan by Dr. Domenic Polite because her systolic was in the [id number] and she was symptomatic.She states that although the losartan had been managing her BP, she had possible allergic reaction that she attributed to the losartan itself and so it was discontinued. She states that she has noticed her BP rising since d/c the losartan and that yesterday her BP was in the 123456 systolic and she had dizziness, headache, emesis. She has no symptoms to report today except feeling 'shaky'. Pt sounded anxious over the phone. Advised her to check her BP after sitting down and relaxing for a couple minutes, but pt checked immediately. BP reading 168/87 HR 77. She states she would like to begin on a new BP med ASAP since her symptoms while on losartan have resolved after discontinuation. Informed pt that message to be routed to [facility], PA-C to advise and can also route to her primary card as well. Pt agreeable

## 2018-12-03 NOTE — Telephone Encounter (Signed)
Lets try her on Hydralazine 25 mg BID- keep follow up as scheduled.   [name] PA-C 12/03/2018 8:19 AM

## 2018-12-04 ENCOUNTER — Telehealth: Payer: Self-pay | Admitting: Cardiology

## 2018-12-04 ENCOUNTER — Other Ambulatory Visit: Payer: Self-pay | Admitting: Cardiology

## 2018-12-04 MED ORDER — HYDRALAZINE HCL 25 MG PO TABS: 25.0000 mg | ORAL_TABLET | Freq: Two times a day (BID) | ORAL | 3 refills | Status: DC

## 2018-12-04 NOTE — Telephone Encounter (Signed)
    Pt called after hours paging service to have BP medication changed to [facility] in [place] as she was having difficulty with it getting filled at [facility] in [place].   Kathyrn Drown NP-C [place] Pager: [phone]

## 2018-12-04 NOTE — Telephone Encounter (Signed)
Patient informed and verbalized understanding

## 2018-12-04 NOTE — Telephone Encounter (Signed)
Called to report elevated BP today 170/84, nausea and dizziness. Denies chest pain or sob. 11/30/18 150/76 HR 72 12/01/18 160/85 HR 75 12/02/18 168/87 HR 77 12/03/18 135/80 HR 57  Patient called this week about this and message sent to [facility]. Patient informed of Kil[place]'s recommendations and verbalized understanding. See below:  Erlene Quan, PA-C to Annita Brod, RN     8:19 AM Note   Lets try her on Hydralazine 25 mg BID- keep follow up as scheduled.   [name] PA-C 12/03/2018 8:19 AM

## 2018-12-04 NOTE — Telephone Encounter (Signed)
Patient called stating that her BP has went up 170/84 P 76 states that she is dizzy and nauseated.

## 2018-12-10 ENCOUNTER — Ambulatory Visit: Payer: Medicare HMO | Admitting: Cardiology

## 2018-12-11 ENCOUNTER — Ambulatory Visit: Payer: Medicare HMO | Admitting: Cardiology

## 2018-12-11 ENCOUNTER — Encounter: Payer: Self-pay | Admitting: Cardiology

## 2018-12-11 ENCOUNTER — Other Ambulatory Visit: Payer: Self-pay

## 2018-12-11 VITALS — BP 130/67 | HR 78 | Ht 63.0 in | Wt 167.0 lb

## 2018-12-11 DIAGNOSIS — F339 Major depressive disorder, recurrent, unspecified: Secondary | ICD-10-CM | POA: Diagnosis not present

## 2018-12-11 DIAGNOSIS — K219 Gastro-esophageal reflux disease without esophagitis: Secondary | ICD-10-CM

## 2018-12-11 DIAGNOSIS — I10 Essential (primary) hypertension: Secondary | ICD-10-CM | POA: Diagnosis not present

## 2018-12-11 DIAGNOSIS — E785 Hyperlipidemia, unspecified: Secondary | ICD-10-CM | POA: Diagnosis not present

## 2018-12-11 DIAGNOSIS — R69 Illness, unspecified: Secondary | ICD-10-CM | POA: Diagnosis not present

## 2018-12-11 DIAGNOSIS — Z951 Presence of aortocoronary bypass graft: Secondary | ICD-10-CM

## 2018-12-11 NOTE — Assessment & Plan Note (Signed)
LDL 79 June 2020

## 2018-12-11 NOTE — Assessment & Plan Note (Signed)
Intolerant to ACE, ARB, Norvasc Hydralazine 25 mg BID added last wek

## 2018-12-11 NOTE — Assessment & Plan Note (Signed)
LIMA-LAD, SVG-OM Aprill 2013 Echo normal LVF Oct 2018 Myoview low risk Feb 2019

## 2018-12-11 NOTE — Assessment & Plan Note (Signed)
Normal EGD 02/2018; Dr. Henrene Pastor
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
## 2018-12-11 NOTE — Patient Instructions (Signed)
Medication Instructions:  Your physician recommends that you continue on your current medications as directed. Please refer to the Current Medication list given to you today. *If you need a refill on your cardiac medications before your next appointment, please call your pharmacy*  Lab Work: None  If you have labs (blood work) drawn today and your tests are completely normal, you will receive your results only by: Marland Kitchen MyChart Message (if you have MyChart) OR . A paper copy in the mail If you have any lab test that is abnormal or we need to change your treatment, we will call you to review the results.  Testing/Procedures: None   Follow-Up: At [facility], you and your health needs are our priority.  As part of our continuing mission to provide you with exceptional heart care, we have created designated Provider Care Teams.  These Care Teams include your primary Cardiologist (physician) and Advanced Practice Providers (APPs -  Physician Assistants and Nurse Practitioners) who all work together to provide you with the care you need, when you need it.  Your next appointment:   6 WEEKS  The format for your next appointment:   In Person  Provider:   Rozann Lesches, MD  Other Instructions PLEASE CONTACT THE OFFICE IF YOUR BLOOD PRESSURE IS OVER 135-140 CONSISTENTLY
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
## 2018-12-16 ENCOUNTER — Ambulatory Visit
Admission: RE | Admit: 2018-12-16 | Discharge: 2018-12-16 | Disposition: A | Discharge status: Home / Self Care | Payer: Medicare HMO | Source: Ambulatory Visit | Attending: Nurse Practitioner | Admitting: Nurse Practitioner

## 2018-12-16 ENCOUNTER — Other Ambulatory Visit: Payer: Self-pay

## 2018-12-16 ENCOUNTER — Telehealth: Payer: Self-pay | Admitting: Cardiology

## 2018-12-16 DIAGNOSIS — Z1231 Encounter for screening mammogram for malignant neoplasm of breast: Secondary | ICD-10-CM | POA: Diagnosis not present

## 2018-12-16 NOTE — Telephone Encounter (Signed)
Reports BP still elevated but she does not have the readings available at this time. Request patient to email BP readings through mychart when she is at home. Verbalized understanding.

## 2018-12-16 NOTE — Telephone Encounter (Signed)
Patient called stating that the BP medication is not working for her.

## 2018-12-16 NOTE — Telephone Encounter (Signed)
Home BP monitor brought to office to be checked for accuracy  Home BP left arm 181/95 HR 79     Office BP left arm 172/78 HR 78 Home BP right arm 168/90 HR 79   Office BP right arm 165/81 HR 79  Advised to start checking BP's at home with consistency, waiting 5-10 minutes before checking, using same arm same cuff, and same times. Encouraged to get a new monitor since DBP is not accurate. Advised to contact us next week with the readings. Verbalized understanding of plan.

## 2018-12-16 NOTE — Telephone Encounter (Signed)
11/02 BP 172/76 HR 86 (11:15 am) 11/01 BP 132/71 HR 71 (am) 153/85 HR 77 (pm) 10/31 BP  158/78 HR 68 (pm) 10/30 BP 165/80 HR 75 (pm) 10/29 BP 133/79 HR 79 (am) 10/28 BP 145/76 HR 74 (6:30 pm)  Advised that BP readings are not consistent. Says BP monitor has never been checked for accuracy. Advised to come to office now to have BP monitor checked for accuracy. Verbalized understanding.

## 2018-12-17 DIAGNOSIS — M48061 Spinal stenosis, lumbar region without neurogenic claudication: Secondary | ICD-10-CM | POA: Diagnosis not present

## 2018-12-17 DIAGNOSIS — M545 Low back pain: Secondary | ICD-10-CM | POA: Diagnosis not present

## 2018-12-17 DIAGNOSIS — M5116 Intervertebral disc disorders with radiculopathy, lumbar region: Secondary | ICD-10-CM | POA: Diagnosis not present

## 2018-12-18 DIAGNOSIS — I10 Essential (primary) hypertension: Secondary | ICD-10-CM | POA: Diagnosis not present

## 2018-12-18 DIAGNOSIS — Z6829 Body mass index (BMI) 29.0-29.9, adult: Secondary | ICD-10-CM | POA: Diagnosis not present

## 2018-12-18 DIAGNOSIS — Z299 Encounter for prophylactic measures, unspecified: Secondary | ICD-10-CM | POA: Diagnosis not present

## 2018-12-18 DIAGNOSIS — M5136 Other intervertebral disc degeneration, lumbar region: Secondary | ICD-10-CM | POA: Diagnosis not present

## 2018-12-18 DIAGNOSIS — R350 Frequency of micturition: Secondary | ICD-10-CM | POA: Diagnosis not present

## 2018-12-20 ENCOUNTER — Telehealth: Payer: Self-pay | Admitting: Internal Medicine

## 2018-12-20 DIAGNOSIS — M545 Low back pain: Secondary | ICD-10-CM | POA: Diagnosis not present

## 2018-12-20 NOTE — Telephone Encounter (Signed)
Pt reported that there was an abnormality of the liver in her MRI done at [facility] and was advised by her doctor to schedule with Dr. Henrene Pastor ASAP.

## 2018-12-20 NOTE — Telephone Encounter (Signed)
Pt states she had an MRI done for some back pain she was having and an abnormality was found in the right lobe of the liver. Pt requesting to be seen asap. Pt scheduled to see Dr. Henrene Pastor 01/01/19@3 :40pm. Pt aware of appt. MRI done at [facility] in [facility]. Ordered by orthopedic at [facility] phone number [phone]. Will call for records.

## 2018-12-23 DIAGNOSIS — M545 Low back pain: Secondary | ICD-10-CM | POA: Diagnosis not present

## 2018-12-23 DIAGNOSIS — N281 Cyst of kidney, acquired: Secondary | ICD-10-CM | POA: Diagnosis not present

## 2018-12-23 DIAGNOSIS — Z87442 Personal history of urinary calculi: Secondary | ICD-10-CM | POA: Diagnosis not present

## 2018-12-24 NOTE — Telephone Encounter (Signed)
MRI report has been received.Records given to [facility] for upcoming appointment on 01-01-2019

## 2018-12-24 NOTE — Telephone Encounter (Signed)
Great - thanks

## 2019-01-01 ENCOUNTER — Encounter: Payer: Self-pay | Admitting: Internal Medicine

## 2019-01-01 ENCOUNTER — Ambulatory Visit: Payer: Medicare HMO | Admitting: Internal Medicine

## 2019-01-01 ENCOUNTER — Other Ambulatory Visit: Payer: Self-pay

## 2019-01-01 VITALS — BP 140/78 | HR 80 | Temp 98.3°F | Ht 63.0 in | Wt 164.0 lb

## 2019-01-01 DIAGNOSIS — K219 Gastro-esophageal reflux disease without esophagitis: Secondary | ICD-10-CM

## 2019-01-01 DIAGNOSIS — K769 Liver disease, unspecified: Secondary | ICD-10-CM

## 2019-01-01 MED ORDER — PANTOPRAZOLE SODIUM 40 MG PO TBEC: 40.0000 mg | DELAYED_RELEASE_TABLET | Freq: Every day | ORAL | 6 refills | Status: DC
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
## 2019-01-01 NOTE — Patient Instructions (Addendum)
If you are age 67 or older, your body mass index should be between 23-30. Your Body mass index is 29.05 kg/m. If this is out of the aforementioned range listed, please consider follow up with your Primary Care Provider.  If you are age 45 or younger, your body mass index should be between 19-25. Your Body mass index is 29.05 kg/m. If this is out of the aformentioned range listed, please consider follow up with your Primary Care Provider.   You will be due for a follow up office visit  in 6 months along with a [place] for follow up on the liver lesion . We will send you a reminder in the mail when it gets closer to that time.  Increase your Protonix to twice a day.   It was a pleasure to see you today!  Dr Henrene Pastor

## 2019-01-02 ENCOUNTER — Other Ambulatory Visit: Payer: Self-pay

## 2019-01-02 DIAGNOSIS — K769 Liver disease, unspecified: Secondary | ICD-10-CM

## 2019-01-13 ENCOUNTER — Other Ambulatory Visit: Payer: Self-pay

## 2019-01-13 ENCOUNTER — Ambulatory Visit (HOSPITAL_COMMUNITY)
Admission: RE | Admit: 2019-01-13 | Discharge: 2019-01-13 | Disposition: A | Discharge status: Home / Self Care | Payer: Medicare HMO | Source: Ambulatory Visit | Attending: Internal Medicine | Admitting: Internal Medicine

## 2019-01-13 DIAGNOSIS — N281 Cyst of kidney, acquired: Secondary | ICD-10-CM | POA: Diagnosis not present

## 2019-01-13 DIAGNOSIS — K769 Liver disease, unspecified: Secondary | ICD-10-CM | POA: Diagnosis not present

## 2019-01-17 ENCOUNTER — Ambulatory Visit: Payer: Medicare HMO | Admitting: Internal Medicine

## 2019-01-23 ENCOUNTER — Encounter: Payer: Self-pay | Admitting: Cardiology

## 2019-01-23 ENCOUNTER — Ambulatory Visit: Payer: Medicare HMO | Admitting: Family Medicine

## 2019-01-23 ENCOUNTER — Other Ambulatory Visit: Payer: Self-pay

## 2019-01-23 VITALS — BP 122/68 | HR 70 | Ht 63.0 in | Wt 170.0 lb

## 2019-01-23 DIAGNOSIS — I10 Essential (primary) hypertension: Secondary | ICD-10-CM

## 2019-01-23 DIAGNOSIS — I2510 Atherosclerotic heart disease of native coronary artery without angina pectoris: Secondary | ICD-10-CM | POA: Diagnosis not present

## 2019-01-23 DIAGNOSIS — R0683 Snoring: Secondary | ICD-10-CM | POA: Diagnosis not present

## 2019-01-23 MED ORDER — SPIRONOLACTONE 25 MG PO TABS: 50.0000 mg | ORAL_TABLET | Freq: Every day | ORAL | 2 refills | Status: DC
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
## 2019-01-23 NOTE — Patient Instructions (Addendum)
Medication Instructions:    Your physician has recommended you make the following change in your medication:   Stop hydralazine  Increase spironolactone to 50 mg by mouth daily  Continue other medications the same  Labwork:  Your physician recommends that you return for non-fasting lab work in: 7-10 days. You may have this done at [facility] or [facility] in [place].   Testing/Procedures:  NONE  Follow-Up:  Your physician recommends that you schedule a follow-up appointment in: 6 months (office). You will receive a reminder letter in the mail in about 4 months reminding you to call and schedule your appointment. If you don't receive this letter, please contact our office.  Any Other Special Instructions Will Be Listed Below (If Applicable).  You have been referred to Sleep Study Clinic.   Please monitor your blood pressures at home.  If you need a refill on your cardiac medications before your next appointment, please call your pharmacy.

## 2019-01-24 NOTE — Telephone Encounter (Signed)
Noted.  Would continue for now and track result periodically.

## 2019-01-28 ENCOUNTER — Telehealth: Payer: Self-pay | Admitting: *Deleted

## 2019-01-28 NOTE — Telephone Encounter (Signed)
A message was left, re: her new patient appointment. 

## 2019-01-28 NOTE — Addendum Note (Signed)
Addended by: Merlene Laughter on: 01/28/2019 02:40 PM   Modules accepted: Orders

## 2019-01-31 ENCOUNTER — Telehealth: Payer: Self-pay | Admitting: *Deleted

## 2019-01-31 ENCOUNTER — Other Ambulatory Visit (HOSPITAL_COMMUNITY)
Admission: RE | Admit: 2019-01-31 | Discharge: 2019-01-31 | Disposition: A | Discharge status: Home / Self Care | Payer: Medicare HMO | Source: Ambulatory Visit | Attending: Cardiology | Admitting: Cardiology

## 2019-01-31 ENCOUNTER — Other Ambulatory Visit: Payer: Self-pay

## 2019-01-31 DIAGNOSIS — I10 Essential (primary) hypertension: Secondary | ICD-10-CM | POA: Diagnosis not present

## 2019-01-31 LAB — BASIC METABOLIC PANEL
Anion gap: 8 (ref 5–15)
BUN: 17 mg/dL (ref 8–23)
CO2: 27 mmol/L (ref 22–32)
Calcium: 9.1 mg/dL (ref 8.9–10.3)
Chloride: 103 mmol/L (ref 98–111)
Creatinine, Ser: 0.81 mg/dL (ref 0.44–1.00)
GFR calc Af Amer: >60 mL/min (ref >60)
GFR calc non Af Amer: >60 mL/min (ref >60)
Glucose, Bld: 115 mg/dL — ABNORMAL HIGH (ref 70–99)
Potassium: 4 mmol/L (ref 3.5–5.1)
Sodium: 138 mmol/L (ref 135–145)

## 2019-01-31 NOTE — Telephone Encounter (Signed)
Patient informed. Copy sent to PCP °

## 2019-01-31 NOTE — Telephone Encounter (Signed)
-----   Message from Satira Sark, MD sent at 01/31/2019 10:18 AM EST ----- Results reviewed.  Follow-up lab work shows normal potassium and renal function after recent medication adjustments.  Continue same for now.

## 2019-02-04 ENCOUNTER — Other Ambulatory Visit: Payer: Self-pay | Admitting: Cardiology

## 2019-02-04 DIAGNOSIS — I2510 Atherosclerotic heart disease of native coronary artery without angina pectoris: Secondary | ICD-10-CM

## 2019-02-17 ENCOUNTER — Encounter: Payer: Self-pay | Admitting: Cardiovascular Disease

## 2019-02-18 ENCOUNTER — Other Ambulatory Visit: Payer: Self-pay

## 2019-02-18 ENCOUNTER — Ambulatory Visit: Payer: Medicare HMO | Attending: Internal Medicine

## 2019-02-18 DIAGNOSIS — Z20822 Contact with and (suspected) exposure to covid-19: Secondary | ICD-10-CM | POA: Diagnosis not present

## 2019-02-20 LAB — NOVEL CORONAVIRUS, NAA: SARS-CoV-2, NAA: NOT DETECTED

## 2019-03-03 ENCOUNTER — Other Ambulatory Visit: Payer: Self-pay

## 2019-03-03 ENCOUNTER — Ambulatory Visit: Payer: Medicare HMO | Attending: Cardiology | Admitting: Cardiovascular Disease

## 2019-03-03 DIAGNOSIS — Z7982 Long term (current) use of aspirin: Secondary | ICD-10-CM | POA: Insufficient documentation

## 2019-03-03 DIAGNOSIS — G4733 Obstructive sleep apnea (adult) (pediatric): Secondary | ICD-10-CM | POA: Insufficient documentation

## 2019-03-03 DIAGNOSIS — R0683 Snoring: Secondary | ICD-10-CM | POA: Diagnosis not present

## 2019-03-03 DIAGNOSIS — Z791 Long term (current) use of non-steroidal anti-inflammatories (NSAID): Secondary | ICD-10-CM | POA: Insufficient documentation

## 2019-03-03 DIAGNOSIS — Z79899 Other long term (current) drug therapy: Secondary | ICD-10-CM | POA: Insufficient documentation

## 2019-03-06 ENCOUNTER — Ambulatory Visit: Payer: Medicare HMO | Admitting: Cardiovascular Disease

## 2019-03-16 ENCOUNTER — Encounter: Payer: Self-pay | Admitting: Cardiovascular Disease

## 2019-03-16 NOTE — Procedures (Signed)
              [facility]        Patient Name: Chalita, Boughman Date: 03/03/2019 Gender: Female D.O.B: January 11, 1951 Age (years): 84 Referring Provider: [name] NP Height (inches): 30 Interpreting Physician: Shelva Majestic MD, ABSM Weight (lbs): 170 RPSGT: Peak, Robert BMI: 30 MRN: [id number] Neck Size: <br>  CLINICAL INFORMATION Sleep Study Type: HST  Indication for sleep study: Snoring  Epworth Sleepiness Score: 7  SLEEP STUDY TECHNIQUE A multi-channel overnight portable sleep study was performed. The channels recorded were: nasal airflow, thoracic respiratory movement, and oxygen saturation with a pulse oximetry. Snoring was also monitored.  MEDICATIONS albuterol (PROVENTIL HFA;VENTOLIN HFA) 108 (90 Base) MCG/ACT inhaler  aspirin EC 81 MG tablet  atorvastatin (LIPITOR) 20 MG tablet  conjugated estrogens (PREMARIN) vaginal cream  cyclobenzaprine (FLEXERIL) 5 MG tablet  diclofenac sodium (VOLTAREN) 1 % GEL  HYDROcodone-acetaminophen (NORCO/VICODIN) 5-325 MG tablet  MELOXICAM PO  nitroGLYCERIN (NITROSTAT) 0.4 MG SL tablet(Expired)  pantoprazole (PROTONIX) 40 MG tablet  spironolactone (ALDACTONE) 25 MG tablet  traMADol (ULTRAM) 50 MG tablet   Patient self administered medications include: N/A.  SLEEP ARCHITECTURE Patient was studied for 417.4 minutes. The sleep efficiency was 87.0 % and the patient was supine for 3.3%. The arousal index was 0.0 per hour.  RESPIRATORY PARAMETERS The overall AHI was 5.3 per hour, with a central apnea index of 1.6 per hour.  The oxygen nadir was 88% during sleep.  CARDIAC DATA Mean heart rate during sleep was 61.0 bpm.  IMPRESSIONS - Mild obstructive sleep apnea overal (AHI = 5.3/h); however, the severity during REM sleep cannot be assessed on this home study. - No significant central sleep apnea occurred during this study (CAI = 1.6/h). - Mild oxygen desaturation to a nadir of 88%. - Patient snored 3.5% during the  sleep.  DIAGNOSIS - Obstructive Sleep Apnea (327.23 [G47.33 ICD-10])  RECOMMENDATIONS - In this patient with cardiovascular co-morbidities recommend a therapeutic CPAP titration to determine optimal pressure required to alleviate sleep disordered breathing; however alternative to CPAP therapy such as a customized oral appliance can be considered. - Effort should be made to optimize nasal and orpharyngeal patency. - Avoid alcohol, sedatives and other CNS depressants that may worsen sleep apnea and disrupt normal sleep architecture. - Sleep hygiene should be reviewed to assess factors that may improve sleep quality. - Weight management (BMI 30) and regular exercise should be initiated or continued. - Consider sleep clinic evaluation prior to therpy to discuss the results of this study if patient is uncertain of therapeutic approach.   [Electronically signed] 03/16/2019 01:31 PM  Shelva Majestic MD, [facility], ABSM Diplomate, American Board of Sleep Medicine   NPI: [id number]  [place] PH: [phone]   FX: [phone] [place]

## 2019-03-20 ENCOUNTER — Ambulatory Visit: Payer: Medicare HMO | Admitting: Cardiovascular Disease

## 2019-03-20 ENCOUNTER — Encounter: Payer: Self-pay | Admitting: Cardiovascular Disease

## 2019-03-20 ENCOUNTER — Telehealth: Payer: Self-pay | Admitting: Cardiovascular Disease

## 2019-03-20 ENCOUNTER — Other Ambulatory Visit: Payer: Self-pay

## 2019-03-20 ENCOUNTER — Telehealth: Payer: Self-pay

## 2019-03-20 VITALS — BP 152/88 | HR 57 | Ht 63.0 in | Wt 173.0 lb

## 2019-03-20 DIAGNOSIS — I2510 Atherosclerotic heart disease of native coronary artery without angina pectoris: Secondary | ICD-10-CM

## 2019-03-20 DIAGNOSIS — G4700 Insomnia, unspecified: Secondary | ICD-10-CM

## 2019-03-20 DIAGNOSIS — G4733 Obstructive sleep apnea (adult) (pediatric): Secondary | ICD-10-CM | POA: Diagnosis not present

## 2019-03-20 DIAGNOSIS — I10 Essential (primary) hypertension: Secondary | ICD-10-CM

## 2019-03-20 DIAGNOSIS — R0683 Snoring: Secondary | ICD-10-CM | POA: Diagnosis not present

## 2019-03-20 DIAGNOSIS — Z951 Presence of aortocoronary bypass graft: Secondary | ICD-10-CM | POA: Diagnosis not present

## 2019-03-20 DIAGNOSIS — K219 Gastro-esophageal reflux disease without esophagitis: Secondary | ICD-10-CM | POA: Diagnosis not present

## 2019-03-20 DIAGNOSIS — E785 Hyperlipidemia, unspecified: Secondary | ICD-10-CM | POA: Diagnosis not present

## 2019-03-20 MED ORDER — DOXAZOSIN MESYLATE 2 MG PO TABS: 2.0000 mg | ORAL_TABLET | Freq: Every day | ORAL | 3 refills | Status: DC

## 2019-03-20 NOTE — Progress Notes (Signed)
Patient ID: [name] [name], female   DOB: [birth year], 69 y.o.   MRN: [id number]     HPI: [name] [name] is a 69 y.o. female who presents to the office today for a 28  month follow up cardiology evaluation.  [name] [name] has CAD and underwent cardiac catheterization on 05/22/2011 which revealed 70% distal left main stenosis, 60% ostial circumflex stenosis and mild RCA disease.  She underwent CABG surgery x2 by Dr. Nils [name] and had a LIMA placed to her LAD, and vein to the OM 2 vessel.  Ejection fraction was 50-55%.  In October 2014, she  noticed some vague chest fullness and underwent a nuclear perfusion study which demonstrated hyperdynamic LV with an EF of 83% post stress.  She did not have ECG changes; she had normal perfusion and function.  When I last saw her in October 2016  she has been without chest pain.  She has a history of vitamin D insufficiency in the past which had improved.  She has issues with muscle spasm for which and was taking Flexeril on an as-needed basis.  She has GERD for which he takes Protonix on an as-needed basis.  She has been maintained on Lipitor 10 mg for hyperlipidemia.  She had issues with diarrhea.  At that time had stopped her losartan.   She underwent an echo Doppler study in October 2016 which showed an EF of 65-70% with grade 1 diastolic dysfunction.  There was mild TR, and otherwise no significant valvular pathology.  She is unaware of any palpitations.  She denies PND, orthopnea.  She had blood work one month ago by her primary physician and her TSH was 1.5.  BUN and creatinine were 15 and 0.73, respectively.  LFTs were normal, total cholesterol 174 with triglycerides 115, HDL 68, LDL 83.    When I last saw her in October 2018 she was having issues with insomnia and was concerned that this could be secondary to losartan.  She was using Benadryl to help her sleep.  Typically she has been going to bed at 9:30, but typically may stay awake and total 1 AM.  If she goes to  bed at 1 AM she falls asleep much more quickly.  She has noticed poor sleep, frequent awakenings, daytime fatigue, and snoring.  She has been sleeping with the head of the bed elevated.  She has an extreme overbite.  During that evaluation, I recommended a trial of zolpidem and to help improve sleep initiation and recommend she undergo a sleep study.  I have not seen her since October 2018.  She has been evaluated [name] Ransom, PA-C, [name] July, NP and recently by Dr. Domenic [name].  In the past, her losartan was discontinued due to complaints of swelling in her throat.  She was started on hydralazine but ultimately developed issues with hydralazine leading to its discontinuance.  More recently she was started on spironolactone and was concerned perhaps that this may be committing to swelling.  She ultimately underwent a home sleep study in January 2021 which demonstrated mild overall sleep apnea with an AHI of 23/h.  Oxygen nadir was 86%.  Because of concerns of her blood pressure medication she now presents for follow-up evaluation with me and has requested that I continue to follow her cardiologically.  Past Surgical History:  Procedure Laterality Date  . ABDOMINAL HYSTERECTOMY  2003  . COLONOSCOPY    . CORONARY ARTERY BYPASS GRAFT  05/26/2011   Procedure: CORONARY ARTERY BYPASS GRAFTING (  CABG);  Surgeon: [name] Poot, MD;  Location: [place];  Service: Open Heart Surgery;  Laterality: N/A;  Coronary Artery Bypass Graft on Pump times two utlizing left internal mammary artery and right greater saphenous vein harvested endoscopically   . LEFT HEART CATHETERIZATION WITH CORONARY ANGIOGRAM N/A 05/24/2011   Procedure: LEFT HEART CATHETERIZATION WITH CORONARY ANGIOGRAM;  Surgeon: [name] Man, MD;  Location: [facility] CATH LAB;  Service: Cardiovascular;  Laterality: N/A;  . TONSILLECTOMY  1962  . TUBAL LIGATION      Allergies  Allergen Reactions  . Losartan Potassium Swelling    "Swelling in her throat"  .  Amlodipine Swelling  . Cetirizine & Related Other (See Comments)    Extreme Drowsiness - "knocks me loopy for 2 days"  . Latex Rash  . Levofloxacin Nausea And Vomiting  . Lisinopril Other (See Comments)    Excessive mucus production  . Prednisone Other (See Comments)    High dose - hallucinations  . Tape Rash    Current Outpatient Medications  Medication Sig Dispense Refill  . albuterol (PROVENTIL HFA;VENTOLIN HFA) 108 (90 Base) MCG/ACT inhaler Inhale 2 puffs into the lungs every 6 (six) hours as needed for wheezing or shortness of breath. (Patient taking differently: Inhale 2 puffs into the lungs every 6 (six) hours as needed for wheezing or shortness of breath. Pt takes very seldom) 2 Inhaler 0  . aspirin EC 81 MG tablet Take 81 mg by mouth daily.    Marland Kitchen atorvastatin (LIPITOR) 20 MG tablet Take 1 tablet (20 mg total) by mouth daily. 90 tablet 3  . conjugated estrogens (PREMARIN) vaginal cream Place 0.5 Applicatorfuls vaginally once a week.    . diclofenac Sodium (VOLTAREN) 1 % GEL Apply 4 g topically as needed.    Marland Kitchen HYDROcodone-acetaminophen (NORCO/VICODIN) 5-325 MG tablet Take 1 tablet by mouth. Every 6-8 hours as needed.    . MELOXICAM PO Take by mouth.    . pantoprazole (PROTONIX) 40 MG tablet Take 1 tablet (40 mg total) by mouth daily. 60 tablet 6  . spironolactone (ALDACTONE) 25 MG tablet Take 50 mg by mouth daily.    . traMADol (ULTRAM) 50 MG tablet Take 25 mg by mouth as needed.     . doxazosin (CARDURA) 2 MG tablet Take 1 tablet (2 mg total) by mouth daily. 90 tablet 3  . nitroGLYCERIN (NITROSTAT) 0.4 MG SL tablet Place 1 tablet (0.4 mg total) under the tongue every 5 (five) minutes as needed for chest pain. 75 tablet 1   No current facility-administered medications for this visit.    Social History   Socioeconomic History  . Marital status: Divorced    Spouse name: single  . Number of children: Not on file  . Years of education: Not on file  . Highest education level:  Not on file  Occupational History  . Occupation: Retired   Tobacco Use  . Smoking status: Former Smoker    Types: Cigarettes    Quit date: 05/23/2006    Years since quitting: 12.8  . Smokeless tobacco: Never Used  Substance and Sexual Activity  . Alcohol use: Yes    Alcohol/week: 0.0 standard drinks    Comment: rare glass of wine  . Drug use: No  . Sexual activity: Not Currently    Birth control/protection: Post-menopausal  Other Topics Concern  . Not on file  Social History Narrative  . Not on file   Social Determinants of Health   Financial Resource Strain:   .  Difficulty of Paying Living Expenses: Not on file  Food Insecurity:   . Worried About Charity fundraiser in the Last Year: Not on file  . Ran Out of Food in the Last Year: Not on file  Transportation Needs:   . Lack of Transportation (Medical): Not on file  . Lack of Transportation (Non-Medical): Not on file  Physical Activity:   . Days of Exercise per Week: Not on file  . Minutes of Exercise per Session: Not on file  Stress:   . Feeling of Stress : Not on file  Social Connections:   . Frequency of Communication with Friends and Family: Not on file  . Frequency of Social Gatherings with Friends and Family: Not on file  . Attends Religious Services: Not on file  . Active Member of Clubs or Organizations: Not on file  . Attends Archivist Meetings: Not on file  . Marital Status: Not on file  Intimate Partner Violence:   . Fear of Current or Ex-Partner: Not on file  . Emotionally Abused: Not on file  . Physically Abused: Not on file  . Sexually Abused: Not on file    Family History  Problem Relation Age of Onset  . Stroke Mother   . Heart failure Mother   . Alcohol abuse Mother   . Mental illness Mother   . Drug abuse Sister   . Cancer Brother        head and neck cancer  . Alcohol abuse Sister   . Lung cancer Maternal Grandmother   . Heart disease Maternal Grandfather   . Colon cancer Neg  Hx   . Breast cancer Neg Hx   . Esophageal cancer Neg Hx   . Rectal cancer Neg Hx   . Stomach cancer Neg Hx     ROS General: Negative; No fevers, chills, or night sweats HEENT: Negative; No changes in vision or hearing, sinus congestion, difficulty swallowing Pulmonary: Negative; No cough, wheezing, shortness of breath, hemoptysis Cardiovascular: See HPI: No chest pain, presyncope, syncope, palpatations GI: Positive for GERD, evaluated by Dr. Henrene Pastor;  No nausea, vomiting, diarrhea, or abdominal pain GU: Positive for cyst on kidney; No dysuria, hematuria, or difficulty voiding Musculoskeletal: Positive for occasional muscle spasm Hematologic: Negative; no easy bruising, bleeding Endocrine: Negative; no heat/cold intolerance; no diabetes, Neuro: Negative; no changes in balance, headaches Skin: Negative; No rashes or skin lesions Psychiatric: Negative; No behavioral problems, depression Sleep: Positive for mild sleep apnea, snoring,  negative; no bruxism, restless legs, hypnogognic hallucinations. Other comprehensive 14 point system review is negative   Physical Exam BP (!) 152/88   Pulse (!) 57   Ht '5\' 3"'$  (1.6 m)   Wt 173 lb (78.5 kg)   BMI 30.65 kg/m    Repeat blood pressure by me was 150/88  Wt Readings from Last 3 Encounters:  03/20/19 173 lb (78.5 kg)  01/23/19 170 lb (77.1 kg)  01/01/19 164 lb (74.4 kg)   General: Alert, oriented, no distress.  Skin: normal turgor, no rashes, warm and dry HEENT: Normocephalic, atraumatic. Pupils equal round and reactive to light; sclera anicteric; extraocular muscles intact;  Nose without nasal septal hypertrophy Mouth/Parynx benign; significant overbite Neck: No JVD, no carotid bruits; normal carotid upstroke Lungs: clear to ausculatation and percussion; no wheezing or rales Chest wall: without tenderness to palpitation Heart: PMI not displaced, RRR, s1 s2 normal, 1/6 systolic murmur, no diastolic murmur, no rubs, gallops, thrills,  or heaves Abdomen: soft, nontender; no hepatosplenomehaly,  BS+; abdominal aorta nontender and not dilated by palpation. Back: no CVA tenderness Pulses 2+ Musculoskeletal: full range of motion, normal strength, no joint deformities Extremities: No significant swelling; no clubbing cyanosis; Homan's sign negative  Neurologic: grossly nonfocal; Cranial nerves grossly wnl Psychologic: Normal mood and affect  ECG (independently read by me): Sinus Bradycardia at 57; normal intervals  October 2018 ECG (independently read by me): Sinus bradycardia at 58 bpm.  Nonspecific ST-T changes.  QTc interval 394 [name].  PR interval 140 [name].  October 2016 ECG (independently read by me): Sinus bradycardia 53 bpm.  No ectopy.  Normal intervals.  August 2015 ECG (independently read by me): Sinus bradycardia at 47 beats per minute.  Nonspecific T changes.  QTc interval   385 [name]  LABS: BMP Latest Ref Rng & Units 01/31/2019 11/04/2018 07/30/2018  Glucose 70 - 99 mg/dL 115(H) 91 91  BUN 8 - 23 mg/dL '17 14 16  '$ Creatinine 0.44 - 1.00 mg/dL 0.81 0.82 0.79  BUN/Creat Ratio 12 - 28 - - 20  Sodium 135 - 145 mmol/L 138 140 143  Potassium 3.5 - 5.1 mmol/L 4.0 3.9 4.3  Chloride 98 - 111 mmol/L 103 104 105  CO2 22 - 32 mmol/L '27 28 23  '$ Calcium 8.9 - 10.3 mg/dL 9.1 9.4 9.9   Hepatic Function Latest Ref Rng & Units 11/04/2018 07/30/2018 04/02/2017  Total Protein 6.5 - 8.1 g/dL 7.4 6.3 6.5  Albumin 3.5 - 5.0 g/dL 4.3 4.4 3.6  AST 15 - 41 U/L '16 17 16  '$ ALT 0 - 44 U/L '21 15 17  '$ Alk Phosphatase 38 - 126 U/L 90 77 76  Total Bilirubin 0.3 - 1.2 mg/dL 0.5 0.3 0.3   CBC Latest Ref Rng & Units 11/04/2018 07/30/2018 11/30/2017  WBC 4.0 - 10.5 K/uL 11.4(H) 7.4 10.7(H)  Hemoglobin 12.0 - 15.0 g/dL 15.4(H) 15.0 13.2  Hematocrit 36.0 - 46.0 % 48.4(H) 42.9 41.2  Platelets 150 - 400 K/uL 466(H) 423 388   Lab Results  Component Value Date   MCV 87.7 11/04/2018   MCV 84 07/30/2018   MCV 89.0 11/30/2017   Lab Results  Component Value  Date   TSH 1.720 07/30/2018   Lab Results  Component Value Date   HGBA1C 6.0 11/05/2017   Lipid Panel     Component Value Date/Time   CHOL 160 07/30/2018 0948   TRIG 86 07/30/2018 0948   HDL 64 07/30/2018 0948   CHOLHDL 2.5 07/30/2018 0948   CHOLHDL 2.3 11/18/2014 0928   VLDL 15 11/18/2014 0928   LDLCALC 79 07/30/2018 0948   RADIOLOGY: No results found.  IMPRESSION:  1. Essential hypertension   2. Coronary artery disease involving native heart without angina pectoris, unspecified vessel or lesion type   3. S/P CABG x 2   4. Hyperlipidemia LDL goal <70   5. OSA (obstructive sleep apnea)   6. Snoring   7. Insomnia, unspecified type   8. Gastroesophageal reflux disease without esophagitis     ASSESSMENT AND PLAN: [name]. [name] [name] is a very pleasant 69 year old female, who underwent CABG revascularization surgery in April 2013 after she was found to have high-grade left main stenosis.  A  2014 nuclear study showed normal perfusion and hyperdynamic LV function.  Paren she is not having any anginal symptomatology.  She has had numerous intolerances to medications and reportedly could not tolerate losartan, hydralazine, amlodipine, lisinopril.  Recently, she has been treated with spironolactone which has been titrated to 25 mg twice a  day.  She was concerned that this was causing swelling.  I discussed with her that this has diuretic effect and should not cause swelling.  With her intolerances to numerous medications I feel she should continue with spironolactone lactone presently.  However with her blood pressure elevation today I am adding doxazosin and she will start 1 mg at bedtime for 2 weeks and then titrate to 2 mg.  I had a long discussion with her regarding her recent home sleep study.  I discussed the limitation of home sleep study particularly in its inability to evaluate for the severity of sleep apnea during REM sleep.  I discussed treatment options including definitive CPAP  therapy versus consideration but not oral appliance.  She has a significant overbite.  After much discussion, she wishes to pursue the customized oral appliance root.  I will refer her to Dr. Oneal Grout for his evaluation and potential for mandibular advancement with a customized oral appliance.  Presently, she continues to be on atorvastatin 20 mg for hyperlipidemia.  Target LDL is less than 70 in this patient with established CAD, status post CABG revascularization.  LDL in June 2020 by Dr. Brigitte Pulse was 22.  When her labs are repeated, if still above 70 I would recommend further titration of atorvastatin to 40 mg or addition of concomitant acetamide if she is unable to tolerate the increase statin dose.  She continues to be on pantoprazole for GERD which is controlling her symptoms.  BMI is increased consistent with mild obesity at 30.65.  I discussed the importance of exercise and weight loss both with reference to her cardiovascular health as well as her sleep apnea.  She continues to have issues with back pain, sinus issues, difficulty with her knees making wall pain difficult.  I will see her in 3 months for reevaluation or sooner if problems arise.   Troy Sine, MD, [facility]  03/22/2019 6:08 PM

## 2019-03-20 NOTE — Patient Instructions (Signed)
Medication Instructions:  BEGIN TAKING DOXAZOSIN ( 1MG  AT BEDTIME FOR THE FIRST 2 WEEKS) =1/2 TABLET.  IF PRESSURES ARE STILL ELEVATED, MAY INCREASE TO (2MG  AT BEDTIME)= 1 TABLET *If you need a refill on your cardiac medications before your next appointment, please call your pharmacy*  Follow-Up: At [facility], you and your health needs are our priority.  As part of our continuing mission to provide you with exceptional heart care, we have created designated Provider Care Teams.  These Care Teams include your primary Cardiologist (physician) and Advanced Practice Providers (APPs -  Physician Assistants and Nurse Practitioners) who all work together to provide you with the care you need, when you need it.  Your next appointment:   3 month(s)  The format for your next appointment:   In Person  Provider:   Shelva Majestic, MD  Other Instructions REFERRAL TO DR. KATZ

## 2019-03-20 NOTE — Telephone Encounter (Signed)
Pt returning call. Reviewed AVS with patient. Verbalized understanding. Pt asked if she was to remain on the Aspirin 81mg .  Spoke with Dr. Claiborne Billings and he said yes, to stay on the Aspirin. No other questions or concerns from pt at this time. Will mail AVS.

## 2019-03-20 NOTE — Telephone Encounter (Signed)
Pt left in a hurry before AVS was given and reviewed.  Told patient we would mail the paperwork to her.  Attempted to call patient to review the AVS following her appt. And no answer. Left a voicemail for her to call us back.

## 2019-03-20 NOTE — Telephone Encounter (Signed)
New Message     Pt c/o medication issue:  1. Name of Medication: doxazosin (CARDURA) 2 MG tablet  2. How are you currently taking this medication (dosage and times per day)? Not yet taking   3. Are you having a reaction (difficulty breathing--STAT)? No   4. What is your medication issue? Pt says she got the medication from the pharmacy and she only got 5 pills. The directions say to take 2 mg daily and she says it is suppose to be 1/2 tablet daily    Please call

## 2019-03-20 NOTE — Telephone Encounter (Signed)
Spoke with patient over the phone to clarify medication directions. Pt verbalized understanding. Stated her pharmacy "messed up her prescription and only gave me 5 pills. They said to clarify with the doctor and come back tomorrow to pick up the rest." pt asked if we would call her pharmacy to clarify.  Called pharmacy for clarification. Pharmacist stated that the patient was in a rush and seemed confused on the instructions for the meds. Pt left with 5 tablets, pharmacist told her she could come back tomorrow and pick up the rest of the prescription.

## 2019-03-20 NOTE — Telephone Encounter (Signed)
Called patient back to inform her I called the pharmacy and clarified the order. Told her per pharmacist she would be able to pick up her medications tomorrow. Pt agreeable and thankful.  No other questions at this time.

## 2019-03-22 ENCOUNTER — Encounter: Payer: Self-pay | Admitting: Cardiovascular Disease

## 2019-03-24 ENCOUNTER — Telehealth: Payer: Self-pay | Admitting: *Deleted

## 2019-03-24 NOTE — Telephone Encounter (Signed)
Referral to Dr Ron Parker sent for oral appliance evaluation.

## 2019-03-27 ENCOUNTER — Telehealth: Payer: Self-pay | Admitting: Cardiology

## 2019-03-27 DIAGNOSIS — Z79899 Other long term (current) drug therapy: Secondary | ICD-10-CM

## 2019-03-27 NOTE — Telephone Encounter (Signed)
Pt took an extra 50 mg of spironolactone tonight in addition to her usual morning dose Her las labs were normal so I have asked her to call [place] office in AM to have BMP done and not to take AM dose of spironolactone.    Asking office to arrange BMP in AM 03/27/19

## 2019-03-28 ENCOUNTER — Telehealth: Payer: Self-pay | Admitting: Cardiovascular Disease

## 2019-03-28 ENCOUNTER — Other Ambulatory Visit (HOSPITAL_COMMUNITY)
Admission: RE | Admit: 2019-03-28 | Discharge: 2019-03-28 | Disposition: A | Discharge status: Home / Self Care | Payer: Medicare HMO | Source: Ambulatory Visit | Attending: Cardiology | Admitting: Cardiology

## 2019-03-28 ENCOUNTER — Other Ambulatory Visit: Payer: Self-pay

## 2019-03-28 DIAGNOSIS — Z79899 Other long term (current) drug therapy: Secondary | ICD-10-CM | POA: Insufficient documentation

## 2019-03-28 LAB — BASIC METABOLIC PANEL
Anion gap: 8 (ref 5–15)
BUN: 9 mg/dL (ref 8–23)
CO2: 25 mmol/L (ref 22–32)
Calcium: 9.1 mg/dL (ref 8.9–10.3)
Chloride: 105 mmol/L (ref 98–111)
Creatinine, Ser: 0.86 mg/dL (ref 0.44–1.00)
GFR calc Af Amer: >60 mL/min (ref >60)
GFR calc non Af Amer: >60 mL/min (ref >60)
Glucose, Bld: 106 mg/dL — ABNORMAL HIGH (ref 70–99)
Potassium: 4 mmol/L (ref 3.5–5.1)
Sodium: 138 mmol/L (ref 135–145)

## 2019-03-28 NOTE — Telephone Encounter (Signed)
Patient states that she accidentally overdosed on her BP medication, spironolactone (ALDACTONE) 25 MG tablet and she went to [facility]. Patient is inquiring about whether or not she can begin taking medication again tonight, 03/28/19. Please call to discuss.

## 2019-03-28 NOTE — Telephone Encounter (Signed)
Spoke with patient. Per pt "I took my normal morning blood pressure meds and then accidentally retook them for the next morning like I hadn't already taken them. I called the after hours line and they told me to drink plenty of water and have labs drawn in the morning." pt stated in total she took 100mg  of spironolactone. Told patient I would have Dr.Kelly review her labs and I would notify him with what she had taken. Pt stated she wouldn't take anything else until she hears back from [place]. No other questions or concerns at this time. Will discuss with Dr.Kelly

## 2019-03-28 NOTE — Addendum Note (Signed)
Addended by: Barbarann Ehlers A on: 03/28/2019 08:07 AM   Modules accepted: Orders

## 2019-03-28 NOTE — Telephone Encounter (Signed)
Called pt with Dr.Kelly's recommendations and review "BMET today shows normal serum creatinine at 0.86 and normal potassium level. Okay to resume blood pressure medications tomorrow morning." Pt agreeable and very thankful. No other questions or concerns at this time.

## 2019-03-28 NOTE — Telephone Encounter (Signed)
Patient had inadvertently taking extra evening doses of medication yesterday.  Be met today shows normal serum creatinine at 0.86 and normal potassium level.  Okay to resume blood pressure medications tomorrow morning

## 2019-03-28 NOTE — Telephone Encounter (Signed)
Spoke with patient.She will have bmet this am at [facility] lab.I faxed lab request

## 2019-04-08 ENCOUNTER — Telehealth: Payer: Self-pay | Admitting: Cardiovascular Disease

## 2019-04-08 NOTE — Telephone Encounter (Signed)
Blood pressures are mainly improved.  I suspect the high salty dinner was a contributor to her errantt blood pressure reading

## 2019-04-08 NOTE — Telephone Encounter (Signed)
Routed to MD to review

## 2019-04-08 NOTE — Telephone Encounter (Signed)
Patient calling to give Dr. Claiborne Billings updated BP readings.  On February 11th (1/2 tablet of Doxazosin) :   9am 138/79 HR 53 then that afternoon  142/78 HR 49  On February 19th (1 tablet of Doxazosin) : 138/80 HR 50  On February 22nd ( 1 Tablet of Doxazosin) : 152/92 HR 64 Patient states that the night before this BP and HR reading she went and had a really salty dinner so this may be the reason her BP was higher.  Patient states that she has lost 3 pounds, she is fasting and only eating 1 meal a day.

## 2019-04-10 NOTE — Telephone Encounter (Signed)
Spoke with pt on Dr.Kelly's review of her BP log. Notified her that per Dr.Kelly, the blood pressures are mainly improved and he suspected that her high salty dinner was a contributor to her higher blood pressure reading. Pt stated she had one other spike the other day but no other complaints. Pt states she will keep a log of her blood pressures and send them in from time to time for review. Notified that this was fine. Pt had no further questions or concerns.

## 2019-05-05 ENCOUNTER — Other Ambulatory Visit: Payer: Medicare HMO

## 2019-05-22 ENCOUNTER — Telehealth: Payer: Self-pay | Admitting: Cardiovascular Disease

## 2019-05-22 ENCOUNTER — Other Ambulatory Visit: Payer: Self-pay

## 2019-05-22 ENCOUNTER — Ambulatory Visit: Payer: Medicare HMO | Attending: Internal Medicine

## 2019-05-22 DIAGNOSIS — R197 Diarrhea, unspecified: Secondary | ICD-10-CM | POA: Diagnosis not present

## 2019-05-22 DIAGNOSIS — Z20822 Contact with and (suspected) exposure to covid-19: Secondary | ICD-10-CM | POA: Diagnosis not present

## 2019-05-22 DIAGNOSIS — Z1159 Encounter for screening for other viral diseases: Secondary | ICD-10-CM | POA: Diagnosis not present

## 2019-05-22 DIAGNOSIS — R1084 Generalized abdominal pain: Secondary | ICD-10-CM | POA: Diagnosis not present

## 2019-05-22 DIAGNOSIS — R7303 Prediabetes: Secondary | ICD-10-CM | POA: Diagnosis not present

## 2019-05-22 DIAGNOSIS — I10 Essential (primary) hypertension: Secondary | ICD-10-CM | POA: Diagnosis not present

## 2019-05-22 NOTE — Telephone Encounter (Signed)
Pt c/o swelling: STAT is pt has developed SOB within 24 hours  1) How much weight have you gained and in what time span? 5 lbs in  A week  2) If swelling, where is the swelling located? Ankles and wrists, wrists have gone down  3) Are you currently taking a fluid pill? yes  4) Are you currently SOB? no  5) Do you have a log of your daily weights (if so, list)? no  6) Have you gained 3 pounds in a day or 5 pounds in a week? 5 lbs in a week  7) Have you traveled recently? no  Patient c/o Palpitations:  High priority if patient c/o lightheadedness, shortness of breath, or chest pain  1) How long have you had palpitations/irregular HR/ Afib? Are you having the symptoms now? Few days  2) Are you currently experiencing lightheadedness, SOB or CP? SOB comes and goes, not having it now  3) Do you have a history of afib (atrial fibrillation) or irregular heart rhythm? bradycardia  4) Have you checked your BP or HR? (document readings if available): earlier 130/60 HR 60 something  5) Are you experiencing any other symptoms? Fullness in chest like bronchitis.   Patient states she is having swelling in her ankles and wrists, but her wrists had gone down. She states she believes she's gained 5 lbs in a week. She also states for the past few days she has had palpitations and a fullness in her chest that feels like bronchitis. She states it is not a pain or pressure. She also states she has been very fatigued and has had diarrhea, but no fever.

## 2019-05-22 NOTE — Telephone Encounter (Signed)
Pt called to report that she has had diarrhea for 2 days.. she has had some bilateral ankle edema.. she feels her heart is pounding not necessarily "palpitations".. she has not been able to sleep and she says she has been wheezing. She says her chest feels like she could have bronchitis.Marland Kitchen no fever, no chest pain, dizziness. She sounds a little tearful on the phone. She reports that she is very tired from a lack of sleep.   I asked the pt if she has been exposed to [place] she says she is not sure and felt it may be good for her to get tested.. I offered her a virtual appt in the meantime but she declined. She says he thinks it would be better to get tested and then she will call us back to make an in person appt.

## 2019-05-23 DIAGNOSIS — Z87891 Personal history of nicotine dependence: Secondary | ICD-10-CM | POA: Diagnosis not present

## 2019-05-23 DIAGNOSIS — J069 Acute upper respiratory infection, unspecified: Secondary | ICD-10-CM | POA: Diagnosis not present

## 2019-05-23 DIAGNOSIS — I10 Essential (primary) hypertension: Secondary | ICD-10-CM | POA: Diagnosis not present

## 2019-05-23 DIAGNOSIS — Z299 Encounter for prophylactic measures, unspecified: Secondary | ICD-10-CM | POA: Diagnosis not present

## 2019-05-23 DIAGNOSIS — E7800 Pure hypercholesterolemia, unspecified: Secondary | ICD-10-CM | POA: Diagnosis not present

## 2019-05-23 LAB — NOVEL CORONAVIRUS, NAA: SARS-CoV-2, NAA: NOT DETECTED

## 2019-05-23 LAB — SARS-COV-2, NAA 2 DAY TAT

## 2019-05-26 DIAGNOSIS — I10 Essential (primary) hypertension: Secondary | ICD-10-CM | POA: Diagnosis not present

## 2019-05-26 DIAGNOSIS — J309 Allergic rhinitis, unspecified: Secondary | ICD-10-CM | POA: Diagnosis not present

## 2019-05-26 DIAGNOSIS — Z299 Encounter for prophylactic measures, unspecified: Secondary | ICD-10-CM | POA: Diagnosis not present

## 2019-05-26 DIAGNOSIS — J069 Acute upper respiratory infection, unspecified: Secondary | ICD-10-CM | POA: Diagnosis not present

## 2019-05-26 DIAGNOSIS — Z87891 Personal history of nicotine dependence: Secondary | ICD-10-CM | POA: Diagnosis not present

## 2019-05-29 ENCOUNTER — Telehealth: Payer: Self-pay | Admitting: Cardiovascular Disease

## 2019-05-29 DIAGNOSIS — I700 Atherosclerosis of aorta: Secondary | ICD-10-CM | POA: Diagnosis not present

## 2019-05-29 DIAGNOSIS — R05 Cough: Secondary | ICD-10-CM | POA: Diagnosis not present

## 2019-05-29 NOTE — Telephone Encounter (Signed)
New Message  Pt c/o of Chest Pain: STAT if CP now or developed within 24 hours  1. Are you having CP right now? No   2. Are you experiencing any other symptoms (ex. SOB, nausea, vomiting, sweating)? All of the above  3. How long have you been experiencing CP? Tightening inside check  4. Is your CP continuous or coming and going? Coming and going 5. Have you taken Nitroglycerin? No  ?

## 2019-05-29 NOTE — Telephone Encounter (Signed)
Pt called to report she stop taking doxazosin on 4/8 because she feels like she had a reaction to the medication. She is now concerned as her BP today was 164/94 HR 61. Pt doesn't have any other readings to provide but requesting MD to recommendation an alternative medication.  Message routed to MD.

## 2019-05-30 NOTE — Telephone Encounter (Signed)
Called and spoke with pt, she states she is doing better and "turning a curve" she states she may be willing to go back on her doxazosin again if dr.kelly did not feel she was having an allergic reaction to the medication. I notified that Dr.Kelly did not think it was an allergic reaction but that he offered an alternative if she wasn't able to tolerated the doxazosin. Notified that she could try Hydralazine 12.5mg  twice a day and if after 5 days she is still having increase BP she could increase to 25mg  twice a day.  Pt states that she would like to try her Doxazosin again and would monitor for the symptoms she was originally having (Wheezing, swelling of ankles and wrists) and if she had this again she would know it was the medication and she would let us know so we could try the hydralazine. Verbalized that this was fine. Pt thankful for the call and will try her Doxazosin again and verbalized she would call with any issues. No other questions at this time.

## 2019-05-30 NOTE — Telephone Encounter (Signed)
Verbal recommendation from [facility]- "can try hydralazine 12.5mg  twice a day. If she is still having increased blood pressures after 5 days, she may increase to 25mg  twice a day"

## 2019-06-16 ENCOUNTER — Encounter: Payer: Self-pay | Admitting: *Deleted

## 2019-06-16 MED ORDER — HYDRALAZINE HCL 25 MG PO TABS: ORAL_TABLET | ORAL | 1 refills | Status: DC

## 2019-06-16 NOTE — Addendum Note (Signed)
Addended by: Alvina Filbert B on: 06/16/2019 10:02 AM   Modules accepted: Orders

## 2019-06-17 DIAGNOSIS — Z1339 Encounter for screening examination for other mental health and behavioral disorders: Secondary | ICD-10-CM | POA: Diagnosis not present

## 2019-06-17 DIAGNOSIS — Z299 Encounter for prophylactic measures, unspecified: Secondary | ICD-10-CM | POA: Diagnosis not present

## 2019-06-17 DIAGNOSIS — R5383 Other fatigue: Secondary | ICD-10-CM | POA: Diagnosis not present

## 2019-06-17 DIAGNOSIS — E7800 Pure hypercholesterolemia, unspecified: Secondary | ICD-10-CM | POA: Diagnosis not present

## 2019-06-17 DIAGNOSIS — Z1211 Encounter for screening for malignant neoplasm of colon: Secondary | ICD-10-CM | POA: Diagnosis not present

## 2019-06-17 DIAGNOSIS — I10 Essential (primary) hypertension: Secondary | ICD-10-CM | POA: Diagnosis not present

## 2019-06-17 DIAGNOSIS — Z0000 Encounter for general adult medical examination without abnormal findings: Secondary | ICD-10-CM | POA: Diagnosis not present

## 2019-06-17 DIAGNOSIS — Z1331 Encounter for screening for depression: Secondary | ICD-10-CM | POA: Diagnosis not present

## 2019-06-17 DIAGNOSIS — Z6828 Body mass index (BMI) 28.0-28.9, adult: Secondary | ICD-10-CM | POA: Diagnosis not present

## 2019-06-17 DIAGNOSIS — Z7189 Other specified counseling: Secondary | ICD-10-CM | POA: Diagnosis not present

## 2019-06-17 DIAGNOSIS — Z79899 Other long term (current) drug therapy: Secondary | ICD-10-CM | POA: Diagnosis not present

## 2019-06-18 ENCOUNTER — Telehealth: Payer: Self-pay | Admitting: Orthopaedic Surgery

## 2019-06-18 ENCOUNTER — Ambulatory Visit: Payer: Medicare HMO | Admitting: Cardiovascular Disease

## 2019-06-18 LAB — BASIC METABOLIC PANEL
BUN: 14 (ref 4–21)
CO2: 24 — AB (ref 13–22)
Chloride: 102 (ref 99–108)
Creatinine: 0.8 (ref 0.5–1.1)
Glucose: 85
Potassium: 4.8 (ref 3.4–5.3)
Sodium: 140 (ref 137–147)

## 2019-06-18 LAB — COMPREHENSIVE METABOLIC PANEL
Albumin: 4.5 (ref 3.5–5.0)
Calcium: 9.9 (ref 8.7–10.7)
GFR calc Af Amer: 85
GFR calc non Af Amer: 74
Globulin: 2.4

## 2019-06-18 LAB — HEPATIC FUNCTION PANEL
ALT: 18 (ref 7–35)
AST: 17 (ref 13–35)
Alkaline Phosphatase: 93 (ref 25–125)
Bilirubin, Total: 0.5

## 2019-06-18 LAB — CBC AND DIFFERENTIAL
HCT: 48 — AB (ref 36–46)
Hemoglobin: 15.8 (ref 12.0–16.0)
Neutrophils Absolute: 5
Platelets: 430 — AB (ref 150–399)
WBC: 9.3

## 2019-06-18 LAB — LIPID PANEL
Cholesterol: 166 (ref 0–200)
HDL: 64 (ref 35–70)
LDL Cholesterol: 85
Triglycerides: 95 (ref 40–160)

## 2019-06-18 LAB — CBC: RBC: 5.56 — AB (ref 3.87–5.11)

## 2019-06-18 LAB — TSH: TSH: 1.41 (ref 0.41–5.90)

## 2019-06-18 NOTE — Telephone Encounter (Signed)
Call via voice message received from patient, inquiring about appointment for right knee/call returned - reached voice mail; left message.

## 2019-06-18 NOTE — Telephone Encounter (Signed)
Patient called back; spoke with front office - appointment scheduled.

## 2019-07-01 ENCOUNTER — Ambulatory Visit: Payer: Medicare HMO | Admitting: Family

## 2019-07-07 ENCOUNTER — Telehealth: Payer: Self-pay | Admitting: Cardiology

## 2019-07-07 NOTE — Telephone Encounter (Signed)
Asking if Dr Domenic Polite would be her PCP and Cardiologist

## 2019-07-07 NOTE — Telephone Encounter (Signed)
Pt aware that Dr Domenic Polite would only be her cardiologist and would need to establish with pcp since she wants to change pcp

## 2019-07-09 ENCOUNTER — Encounter (INDEPENDENT_AMBULATORY_CARE_PROVIDER_SITE_OTHER): Payer: Self-pay

## 2019-07-19 ENCOUNTER — Other Ambulatory Visit: Payer: Self-pay

## 2019-07-19 ENCOUNTER — Emergency Department (HOSPITAL_COMMUNITY): Payer: Medicare HMO

## 2019-07-19 ENCOUNTER — Emergency Department (HOSPITAL_COMMUNITY)
Admission: EM | Admit: 2019-07-19 | Discharge: 2019-07-20 | Disposition: A | Discharge status: Home / Self Care | Payer: Medicare HMO | Attending: Emergency Medicine | Admitting: Emergency Medicine

## 2019-07-19 ENCOUNTER — Encounter (HOSPITAL_COMMUNITY): Payer: Self-pay

## 2019-07-19 DIAGNOSIS — R748 Abnormal levels of other serum enzymes: Secondary | ICD-10-CM | POA: Diagnosis not present

## 2019-07-19 DIAGNOSIS — Z7982 Long term (current) use of aspirin: Secondary | ICD-10-CM | POA: Insufficient documentation

## 2019-07-19 DIAGNOSIS — Z91040 Latex allergy status: Secondary | ICD-10-CM | POA: Diagnosis not present

## 2019-07-19 DIAGNOSIS — Z79899 Other long term (current) drug therapy: Secondary | ICD-10-CM | POA: Insufficient documentation

## 2019-07-19 DIAGNOSIS — R600 Localized edema: Secondary | ICD-10-CM | POA: Diagnosis not present

## 2019-07-19 DIAGNOSIS — Z87891 Personal history of nicotine dependence: Secondary | ICD-10-CM | POA: Diagnosis not present

## 2019-07-19 DIAGNOSIS — R0602 Shortness of breath: Secondary | ICD-10-CM | POA: Diagnosis not present

## 2019-07-19 DIAGNOSIS — I2510 Atherosclerotic heart disease of native coronary artery without angina pectoris: Secondary | ICD-10-CM | POA: Insufficient documentation

## 2019-07-19 DIAGNOSIS — Z951 Presence of aortocoronary bypass graft: Secondary | ICD-10-CM | POA: Insufficient documentation

## 2019-07-19 DIAGNOSIS — M7989 Other specified soft tissue disorders: Secondary | ICD-10-CM | POA: Diagnosis present

## 2019-07-19 DIAGNOSIS — R079 Chest pain, unspecified: Secondary | ICD-10-CM | POA: Diagnosis not present

## 2019-07-19 DIAGNOSIS — I10 Essential (primary) hypertension: Secondary | ICD-10-CM | POA: Insufficient documentation

## 2019-07-19 DIAGNOSIS — R9431 Abnormal electrocardiogram [ECG] [EKG]: Secondary | ICD-10-CM | POA: Diagnosis not present

## 2019-07-19 LAB — CBC
HCT: 39.9 % (ref 36.0–46.0)
Hemoglobin: 13.1 g/dL (ref 12.0–15.0)
MCH: 28.5 pg (ref 26.0–34.0)
MCHC: 32.8 g/dL (ref 30.0–36.0)
MCV: 86.7 fL (ref 80.0–100.0)
Platelets: 386 10*3/uL (ref 150–400)
RBC: 4.6 MIL/uL (ref 3.87–5.11)
RDW: 14.3 % (ref 11.5–15.5)
WBC: 10.9 10*3/uL — ABNORMAL HIGH (ref 4.0–10.5)
nRBC: 0 % (ref 0.0–0.2)

## 2019-07-19 LAB — BASIC METABOLIC PANEL
Anion gap: 10 (ref 5–15)
BUN: 25 mg/dL — ABNORMAL HIGH (ref 8–23)
CO2: 26 mmol/L (ref 22–32)
Calcium: 9.7 mg/dL (ref 8.9–10.3)
Chloride: 102 mmol/L (ref 98–111)
Creatinine, Ser: 0.79 mg/dL (ref 0.44–1.00)
GFR calc Af Amer: >60 mL/min (ref >60)
GFR calc non Af Amer: >60 mL/min (ref >60)
Glucose, Bld: 105 mg/dL — ABNORMAL HIGH (ref 70–99)
Potassium: 3.8 mmol/L (ref 3.5–5.1)
Sodium: 138 mmol/L (ref 135–145)

## 2019-07-19 NOTE — ED Notes (Addendum)
Pt reports CABG in 2013   "because they could not stent"  Has done well since  Eats right exercises   Former smoker   Noted Thurs day that she had bilateral leg swelling  Cramping with shortness of breath and chest pressure   Noted on pulse oc 94-96 per cent O2 1 L Raised sat to 97-98  IV, blood to lab, PCXR  Card Dr Corky Downs   Awaiting eval l

## 2019-07-19 NOTE — ED Triage Notes (Signed)
Pt arrives from home c/o bilateral leg swelling with Hx of heart problems. Pt reports Spirolactone at home but swelling has worsened since yesterday. Pt reports pain with ambulation and slight SOB and discomfort in chest.
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
## 2019-07-20 LAB — CK: Total CK: 279 U/L — ABNORMAL HIGH (ref 38–234)

## 2019-07-20 LAB — BRAIN NATRIURETIC PEPTIDE: B Natriuretic Peptide: 44 pg/mL (ref 0.0–100.0)

## 2019-07-20 MED ORDER — ONDANSETRON HCL 4 MG/2ML IJ SOLN
4.0000 mg | Freq: Once | INTRAMUSCULAR | Status: AC
  Administered 2019-07-20: 4 mg via INTRAVENOUS
  Filled 2019-07-20: qty 2

## 2019-07-20 MED ORDER — SODIUM CHLORIDE 0.9 % IV BOLUS
500.0000 mL | Freq: Once | INTRAVENOUS | Status: AC
  Administered 2019-07-20: 500 mL via INTRAVENOUS

## 2019-07-20 NOTE — Discharge Instructions (Addendum)
You were seen today for leg swelling and cramping.  Often times swelling is related to fluid shifts in the body.  Continue to take your medications as prescribed.  You did have some evidence of elevated CK which can cause some muscle cramping.  Make sure that you are staying appropriately hydrated and avoid strenuous activity.  Follow-up closely with your cardiologist.

## 2019-07-21 ENCOUNTER — Encounter: Payer: Self-pay | Admitting: Cardiovascular Disease

## 2019-07-21 ENCOUNTER — Ambulatory Visit (HOSPITAL_COMMUNITY)
Admission: RE | Admit: 2019-07-21 | Discharge: 2019-07-21 | Disposition: A | Discharge status: Home / Self Care | Payer: Medicare HMO | Source: Ambulatory Visit | Attending: Cardiovascular Disease | Admitting: Cardiovascular Disease

## 2019-07-21 ENCOUNTER — Ambulatory Visit: Payer: Medicare HMO | Admitting: Cardiovascular Disease

## 2019-07-21 ENCOUNTER — Other Ambulatory Visit: Payer: Self-pay

## 2019-07-21 ENCOUNTER — Ambulatory Visit: Payer: Medicare HMO | Admitting: Orthopedic Surgery

## 2019-07-21 ENCOUNTER — Telehealth: Payer: Self-pay

## 2019-07-21 DIAGNOSIS — I10 Essential (primary) hypertension: Secondary | ICD-10-CM | POA: Insufficient documentation

## 2019-07-21 DIAGNOSIS — I2510 Atherosclerotic heart disease of native coronary artery without angina pectoris: Secondary | ICD-10-CM

## 2019-07-21 DIAGNOSIS — Z951 Presence of aortocoronary bypass graft: Secondary | ICD-10-CM

## 2019-07-21 DIAGNOSIS — E785 Hyperlipidemia, unspecified: Secondary | ICD-10-CM

## 2019-07-21 DIAGNOSIS — G4733 Obstructive sleep apnea (adult) (pediatric): Secondary | ICD-10-CM | POA: Diagnosis not present

## 2019-07-21 DIAGNOSIS — M79605 Pain in left leg: Secondary | ICD-10-CM

## 2019-07-21 DIAGNOSIS — M79604 Pain in right leg: Secondary | ICD-10-CM | POA: Diagnosis not present

## 2019-07-21 DIAGNOSIS — K219 Gastro-esophageal reflux disease without esophagitis: Secondary | ICD-10-CM

## 2019-07-21 NOTE — Progress Notes (Signed)
Patient ID: [name] [name], female   DOB: [birth year], 69 y.o.   MRN: [id number]     HPI: [name] [name] is a 69 y.o. female who presents to the office today for a 75monthfollow up cardiology evaluation.  [name] ENeedhamhas CAD and underwent cardiac catheterization on 05/22/2011 which revealed 70% distal left main stenosis, 60% ostial circumflex stenosis and mild RCA disease.  She underwent CABG surgery x2 by Dr. VNils Pyleand had a LIMA placed to her LAD, and vein to the OM 2 vessel.  Ejection fraction was 50-55%.  In October 2014, she  noticed some vague chest fullness and underwent a nuclear perfusion study which demonstrated hyperdynamic LV with an EF of 83% post stress.  She did not have ECG changes; she had normal perfusion and function.  When I last saw her in October 2016  she has been without chest pain.  She has a history of vitamin D insufficiency in the past which had improved.  She has issues with muscle spasm for which and was taking Flexeril on an as-needed basis.  She has GERD for which he takes Protonix on an as-needed basis.  She has been maintained on Lipitor 10 mg for hyperlipidemia.  She had issues with diarrhea.  At that time had stopped her losartan.   She underwent an echo Doppler study in October 2016 which showed an EF of 65-70% with grade 1 diastolic dysfunction.  There was mild TR, and otherwise no significant valvular pathology.  She is unaware of any palpitations.  She denies PND, orthopnea.  She had blood work one month ago by her primary physician and her TSH was 1.5.  BUN and creatinine were 15 and 0.73, respectively.  LFTs were normal, total cholesterol 174 with triglycerides 115, HDL 68, LDL 83.    When I saw her in October 2018 she was having issues with insomnia and was concerned that this could be secondary to losartan.  She was using Benadryl to help her sleep.  Typically she has been going to bed at 9:30, but typically may stay awake and total 1 AM.  If she goes to bed at 1  AM she falls asleep much more quickly.  She has noticed poor sleep, frequent awakenings, daytime fatigue, and snoring.  She has been sleeping with the head of the bed elevated.  She has an extreme overbite.  During that evaluation, I recommended a trial of zolpidem and to help improve sleep initiation and recommend she undergo a sleep study.  I had not seen her since October 2018 and subsequently saw her in February 2021 in the interim she had  been evaluated [name] PA-C, [name] NP and recently by [name] [name]  In the past, her losartan was discontinued due to complaints of swelling in her throat.  She was started on hydralazine but ultimately developed issues with hydralazine leading to its discontinuance.  More recently she was started on spironolactone and was concerned perhaps that this may be contributing to swelling.  She ultimately underwent a home sleep study in January 2021 which demonstrated mild overall sleep apnea with an AHI of 5.3/h.  Oxygen nadir was 88%.  Because of concerns of her blood pressure medication I saw her on March 20, 2019 for follow-up evaluation with me and has requested that I continue to follow her cardiologically.  During that evaluation, her blood pressure was elevated and due to intolerances to numerous medications I added doxazosin initially 1 mg at  bedtime for 2 weeks with potential titration to 2 mg.  I also discussed options of CPAP PAP therapy versus oral appliance.  She wished to pursue a customized oral appliance as initial treatment and I referred her to [name] [name] for evaluation.  I recommended aggressive lipid therapy with target LDL less than 70.  Over the past several months, her leg swelling has improved but she has recently developed pain in the back of her calves particularly on the right with cramping.  She denied any shortness of breath.  She denied palpitations or chest pain.  She presents for evaluation.  Past Surgical History:  Procedure  Laterality Date  . ABDOMINAL HYSTERECTOMY  2003  . COLONOSCOPY  2019   [name] [name]  . CORONARY ARTERY BYPASS GRAFT  05/26/2011   Procedure: CORONARY ARTERY BYPASS GRAFTING (CABG);  Surgeon: [name] Poot, MD;  Location: [place];  Service: Open Heart Surgery;  Laterality: N/A;  Coronary Artery Bypass Graft on Pump times two utlizing left internal mammary artery and right greater saphenous vein harvested endoscopically   . [facility]STIC [facility]MOGRAM  [facility]  . LEFT HEART CATHETERIZATION WITH CORONARY ANGIOGRAM N/A 05/24/2011   Procedure: LEFT HEART CATHETERIZATION WITH CORONARY ANGIOGRAM;  Surgeon: Leonie Man, MD;  Location: [facility] CATH [facility];  Service: Cardiovascular;  Laterality: N/A;  . TONSILLECTOMY  1962  . TUBAL LIGATION      Allergies  Allergen Reactions  . Losartan Potassium Swelling    "Swelling in her throat"  . Cardura [Doxazosin]     edema  . Amlodipine Swelling  . Cetirizine & Related Other (See Comments)    Extreme Drowsiness - "knocks me loopy for 2 days"  . Latex Rash  . Levofloxacin Nausea And Vomiting  . Lisinopril Other (See Comments)    Excessive mucus production, swelling  . Prednisone Other (See Comments)    High dose - hallucinations  . Tape Rash    Current Outpatient Medications  Medication Sig Dispense Refill  . aspirin EC 81 MG tablet Take 81 mg by mouth daily.    Marland Kitchen atorvastatin (LIPITOR) 20 MG tablet Take 1 tablet (20 mg total) by mouth daily. 90 tablet 3  . conjugated estrogens (PREMARIN) vaginal cream Place 0.5 Applicatorfuls vaginally once a week.    . pantoprazole (PROTONIX) 40 MG tablet Take 1 tablet (40 mg total) by mouth daily. 60 tablet 6  . spironolactone (ALDACTONE) 25 MG tablet Take 50 mg by mouth daily.    . sucralfate (CARAFATE) 1 g tablet Take 1 tablet (1 g total) by mouth in the morning, at noon, and at bedtime. 90 tablet 12   No current facility-administered medications for this visit.    Social History   Socioeconomic History   . Marital status: Divorced    Spouse name: single  . Number of children: Not on file  . Years of education: Not on file  . Highest education level: Not on file  Occupational History  . Occupation: Retired   Tobacco Use  . Smoking status: Former Smoker    Packs/day: 0.50    Years: 15.00    Pack years: 7.50    Types: Cigarettes    Quit date: 05/23/2006    Years since quitting: 13.1  . Smokeless tobacco: Never Used  Vaping Use  . Vaping Use: Never used  Substance and Sexual Activity  . Alcohol use: Yes    Alcohol/week: 0.0 standard drinks    Comment: rare glass of wine  .  Drug use: No  . Sexual activity: Not Currently    Birth control/protection: Post-menopausal  Other Topics Concern  . Not on file  Social History Narrative   Diet:  Fish and veggies - does not eat a lot of red meat   Do you drink/eat things with caffeine?  2 cups a day   Marital status:  Divorced   Do you live in a house, apartment, assisted living, condo, trailer, etc.)? Renting a one-story house   Is it one or more stories?  One   How many persons live in your home?  Lives alone   Do you have any pets in your home? (please list)  One dog   Education:  Some college   Current or past profession:  CNA   Do you exercise:  Yes - Walks 2-3 times a week.      ADVANCED DIRECTIVES  None.  Does want CPR.       FUNCTIONAL STATUS   Has no difficulties with ADLs.   Social Determinants of Health   Financial Resource Strain:   . Difficulty of Paying Living Expenses:   Food Insecurity:   . Worried About Charity fundraiser in the Last Year:   . Arboriculturist in the Last Year:   Transportation Needs:   . Film/video editor (Medical):   Marland Kitchen Lack of Transportation (Non-Medical):   Physical Activity:   . Days of Exercise per Week:   . Minutes of Exercise per Session:   Stress:   . Feeling of Stress :   Social Connections:   . Frequency of Communication with Friends and Family:   . Frequency of Social  Gatherings with Friends and Family:   . Attends Religious Services:   . Active Member of Clubs or Organizations:   . Attends Archivist Meetings:   Marland Kitchen Marital Status:   Intimate Partner Violence:   . Fear of Current or Ex-Partner:   . Emotionally Abused:   Marland Kitchen Physically Abused:   . Sexually Abused:     Family History  Problem Relation Age of Onset  . Stroke Mother   . Heart failure Mother   . Alcohol abuse Mother   . Mental illness Mother   . Bone cancer Father   . Drug abuse Sister   . Cancer Brother        head and neck cancer  . Alcohol abuse Sister   . Pancreatitis Sister   . Hypertension Daughter   . Fibroids Daughter   . Lung cancer Maternal Grandmother   . Heart disease Maternal Grandfather   . Colon cancer Neg Hx   . Breast cancer Neg Hx   . Esophageal cancer Neg Hx   . Rectal cancer Neg Hx   . Stomach cancer Neg Hx     ROS General: Negative; No fevers, chills, or night sweats HEENT: Negative; No changes in vision or hearing, sinus congestion, difficulty swallowing Pulmonary: Negative; No cough, wheezing, shortness of breath, hemoptysis Cardiovascular: See HPI: No chest pain, presyncope, syncope, palpatations GI: Positive for GERD, evaluated by [name] [name];  No nausea, vomiting, diarrhea, or abdominal pain GU: Positive for cyst on kidney; No dysuria, hematuria, or difficulty voiding Musculoskeletal: Positive for occasional muscle spasm Hematologic: Negative; no easy bruising, bleeding Endocrine: Negative; no heat/cold intolerance; no diabetes, Neuro: Negative; no changes in balance, headaches Skin: Negative; No rashes or skin lesions Psychiatric: Negative; No behavioral problems, depression Sleep: Positive for mild sleep apnea, snoring,  negative;  no bruxism, restless legs, hypnogognic hallucinations. Other comprehensive 14 point system review is negative   Physical Exam BP 134/72   Pulse (!) 55   Ht '5\' 3"'$  (1.6 m)   Wt 170 lb (77.1 kg)   SpO2 95%    BMI 30.11 kg/m    Repeat blood pressure by me was 126/70  Wt Readings from Last 3 Encounters:  07/23/19 167 lb 6.4 oz (75.9 kg)  07/21/19 170 lb (77.1 kg)  07/19/19 166 lb (75.3 kg)   General: Alert, oriented, no distress.  Skin: normal turgor, no rashes, warm and dry HEENT: Normocephalic, atraumatic. Pupils equal round and reactive to light; sclera anicteric; extraocular muscles intact;  Nose without nasal septal hypertrophy Mouth/Parynx benign; Mallinpatti scale 3 Neck: No JVD, no carotid bruits; normal carotid upstroke Lungs: clear to ausculatation and percussion; no wheezing or rales Chest wall: without tenderness to palpitation Heart: PMI not displaced, RRR, s1 s2 normal, 1/6 systolic murmur, no diastolic murmur, no rubs, gallops, thrills, or heaves Abdomen: soft, nontender; no hepatosplenomehaly, BS+; abdominal aorta nontender and not dilated by palpation. Back: no CVA tenderness Pulses 2+ Musculoskeletal: full range of motion, normal strength, no joint deformities Extremities: Tenderness to the posterior calves without palpable cord.  No clubbing cyanosis or edema, Homan's sign negative  Neurologic: grossly nonfocal; Cranial nerves grossly wnl Psychologic: Normal mood and affect   ECG (independently read by me): Sinus bradycardia at 55; nondiagnostic T wave abnormality in V1-2  04/09/2019 ECG (independently read by me): Sinus Bradycardia at 57; normal intervals  October 2018 ECG (independently read by me): Sinus bradycardia at 58 bpm.  Nonspecific ST-T changes.  QTc interval 394 [name].  PR interval 140 [name].  October 2016 ECG (independently read by me): Sinus bradycardia 53 bpm.  No ectopy.  Normal intervals.  August 2015 ECG (independently read by me): Sinus bradycardia at 47 beats per minute.  Nonspecific T changes.  QTc interval   385 [name]  LABS: BMP Latest Ref Rng & Units 07/19/2019 06/18/2019 03/28/2019  Glucose 70 - 99 mg/dL 105(H) - 106(H)  BUN 8 - 23 mg/dL 25(H) 14 9   Creatinine 0.44 - 1.00 mg/dL 0.79 0.8 0.86  BUN/Creat Ratio 12 - 28 - - -  Sodium 135 - 145 mmol/L 138 140 138  Potassium 3.5 - 5.1 mmol/L 3.8 4.8 4.0  Chloride 98 - 111 mmol/L 102 102 105  CO2 22 - 32 mmol/L 26 24(A) 25  Calcium 8.9 - 10.3 mg/dL 9.7 9.9 9.1   Hepatic Function Latest Ref Rng & Units 06/18/2019 11/04/2018 07/30/2018  Total Protein 6.5 - 8.1 g/dL - 7.4 6.3  Albumin 3.5 - 5.0 4.5 4.3 4.4  AST 13 - 35 '17 16 17  '$ ALT 7 - 35 '18 21 15  '$ Alk Phosphatase 25 - 125 93 90 77  Total Bilirubin 0.3 - 1.2 mg/dL - 0.5 0.3   CBC Latest Ref Rng & Units 07/19/2019 06/18/2019 11/04/2018  WBC 4.0 - 10.5 K/uL 10.9(H) 9.3 11.4(H)  Hemoglobin 12.0 - 15.0 g/dL 13.1 15.8 15.4(H)  Hematocrit 36 - 46 % 39.9 48(A) 48.4(H)  Platelets 150 - 400 K/uL 386 430(A) 466(H)   Lab Results  Component Value Date   MCV 86.7 07/19/2019   MCV 87.7 11/04/2018   MCV 84 07/30/2018   Lab Results  Component Value Date   TSH 1.41 06/18/2019   Lab Results  Component Value Date   HGBA1C 6.0 11/05/2017   Lipid Panel     Component Value Date/Time  CHOL 166 06/18/2019 0000   CHOL 160 07/30/2018 0948   TRIG 95 06/18/2019 0000   HDL 64 06/18/2019 0000   HDL 64 07/30/2018 0948   CHOLHDL 2.5 07/30/2018 0948   CHOLHDL 2.3 11/18/2014 0928   VLDL 15 11/18/2014 0928   LDLCALC 85 06/18/2019 0000   LDLCALC 79 07/30/2018 0948   RADIOLOGY: No results found.  IMPRESSION:  1. Essential hypertension   2. Pain in both lower extremities   3. CAD in native artery   4. S/P CABG x 2   5. Hyperlipidemia LDL goal <70   6. OSA (obstructive sleep apnea)   7. Gastroesophageal reflux disease without esophagitis     ASSESSMENT AND PLAN: [name]. [name] [name] is a very pleasant 69 year old female, who underwent CABG revascularization surgery in April 2013 after she was found to have high-grade left main stenosis.  A  2014 nuclear study showed normal perfusion and hyperdynamic LV function.  She is not having any anginal  symptomatology.  She has had numerous intolerances to medications and reportedly could not tolerate losartan, hydralazine, amlodipine, lisinopril.  She was subsequently started on spironolactone which has been titrated to 25 mg twice a day.  She was concerned that this was causing swelling.  When I saw her after not having seen her in over 2 years, due to continued blood pressure elevation low-dose doxazosin was added.  Apparently she is no longer taking this medication.  Her leg swelling has improved with spironolactone.  She has noticed painful calves posteriorly.  Although I did not feel any definitive palpable cord in her sign was most likely negative due to her concerns I have recommended she undergo a lower extremity venous Doppler study to make certain there is no underlying DVT.  Her blood pressure today is stable and on repeat by me was 126/70.  She continues to be on atorvastatin 20 mg for hyperlipidemia.  Laboratory in May 2021 by her primary physician showed a total cholesterol 166 LDL 85 triglycerides 95 and HDL 64.  Target LDL is less than 70.  If she cannot reach this if she can tolerate increased dose of atorvastatin this should be pursued alternatively zetia 10 mg may be added to her regimen.  She has mild obstructive sleep apnea noted on a home sleep study.  In the past we had discussed alternatives to CPAP therapy including customized oral appliance which she ultimately had stated she would pursue.  She is not having any anginal symptoms.  Cardiac rhythm is stable.  I will contact her regarding her lower extremity venous Doppler study.  Her GERD is controlled with pantoprazole.  I will see her in 4 to 6 months for reevaluation or sooner as necessary   Troy Sine, MD, [facility]  07/27/2019 10:22 AM

## 2019-07-21 NOTE — Patient Instructions (Signed)
Medication Instructions:  CONTINUE WITH CURRENT MEDICATIONS. NO CHANGES.  *If you need a refill on your cardiac medications before your next appointment, please call your pharmacy*    Testing/Procedures: Your physician has requested that you have a lower extremity venous duplex. This test is an ultrasound of the veins in the legs. It looks at venous blood flow that carries blood from the heart to the legs . Allow one hour for a Lower Venous exam.There are no restrictions or special instructions.     Follow-Up: At [facility], you and your health needs are our priority.  As part of our continuing mission to provide you with exceptional heart care, we have created designated Provider Care Teams.  These Care Teams include your primary Cardiologist (physician) and Advanced Practice Providers (APPs -  Physician Assistants and Nurse Practitioners) who all work together to provide you with the care you need, when you need it.  We recommend signing up for the patient portal called "MyChart".  Sign up information is provided on this After Visit Summary.  MyChart is used to connect with patients for Virtual Visits (Telemedicine).  Patients are able to view lab/test results, encounter notes, upcoming appointments, etc.  Non-urgent messages can be sent to your provider as well.   To learn more about what you can do with MyChart, go to [email or web address].    Your next appointment:   4-6 month(s)  The format for your next appointment:   In Person  Provider:   Shelva Majestic, MD

## 2019-07-21 NOTE — Telephone Encounter (Signed)
Called and spoke with pt, notified that the preliminary results of her VAS [place] Lower extremity dopplers were negative and that Dr.Kelly was aware. Notified he would formally review them and we would send the result through mychart when he did. Pt verbalized understanding with no other questions at this time.

## 2019-07-23 ENCOUNTER — Encounter: Payer: Self-pay | Admitting: Internal Medicine

## 2019-07-23 ENCOUNTER — Ambulatory Visit (INDEPENDENT_AMBULATORY_CARE_PROVIDER_SITE_OTHER): Payer: Medicare HMO | Admitting: Internal Medicine

## 2019-07-23 VITALS — BP 128/72 | HR 67 | Ht 63.0 in | Wt 167.4 lb

## 2019-07-23 DIAGNOSIS — K219 Gastro-esophageal reflux disease without esophagitis: Secondary | ICD-10-CM | POA: Diagnosis not present

## 2019-07-23 DIAGNOSIS — R1013 Epigastric pain: Secondary | ICD-10-CM

## 2019-07-23 DIAGNOSIS — D122 Benign neoplasm of ascending colon: Secondary | ICD-10-CM | POA: Diagnosis not present

## 2019-07-23 MED ORDER — SUCRALFATE 1 G PO TABS
1.0000 g | ORAL_TABLET | Freq: Three times a day (TID) | ORAL | 12 refills | Status: DC
Start: 2019-07-23 — End: 2019-11-25

## 2019-07-23 NOTE — Patient Instructions (Addendum)
If you are age 68 or older, your body mass index should be between 23-30. Your Body mass index is 29.65 kg/m. If this is out of the aforementioned range listed, please consider follow up with your Primary Care Provider.  If you are age 76 or younger, your body mass index should be between 19-25. Your Body mass index is 29.65 kg/m. If this is out of the aformentioned range listed, please consider follow up with your Primary Care Provider.   START Carafate 1 gm 1 tablet (made into a slurry) 3 times daily.  ** You may create a slurry by pouring water into a small glass and letting the tablet dissolve.
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
## 2019-07-27 ENCOUNTER — Encounter: Payer: Self-pay | Admitting: Cardiovascular Disease

## 2019-08-04 ENCOUNTER — Ambulatory Visit: Payer: Medicare HMO | Admitting: Cardiology

## 2019-08-08 DIAGNOSIS — I2510 Atherosclerotic heart disease of native coronary artery without angina pectoris: Secondary | ICD-10-CM | POA: Diagnosis not present

## 2019-08-08 DIAGNOSIS — Z1331 Encounter for screening for depression: Secondary | ICD-10-CM | POA: Diagnosis not present

## 2019-08-08 DIAGNOSIS — J449 Chronic obstructive pulmonary disease, unspecified: Secondary | ICD-10-CM | POA: Diagnosis not present

## 2019-08-08 DIAGNOSIS — E7849 Other hyperlipidemia: Secondary | ICD-10-CM | POA: Diagnosis not present

## 2019-08-08 DIAGNOSIS — R82998 Other abnormal findings in urine: Secondary | ICD-10-CM | POA: Diagnosis not present

## 2019-08-08 DIAGNOSIS — I10 Essential (primary) hypertension: Secondary | ICD-10-CM | POA: Diagnosis not present

## 2019-08-08 DIAGNOSIS — R7303 Prediabetes: Secondary | ICD-10-CM | POA: Diagnosis not present

## 2019-08-20 DIAGNOSIS — M25562 Pain in left knee: Secondary | ICD-10-CM | POA: Diagnosis not present

## 2019-08-20 DIAGNOSIS — M25561 Pain in right knee: Secondary | ICD-10-CM | POA: Diagnosis not present

## 2019-09-15 ENCOUNTER — Other Ambulatory Visit: Payer: Self-pay | Admitting: Internal Medicine

## 2019-09-15 DIAGNOSIS — Z1231 Encounter for screening mammogram for malignant neoplasm of breast: Secondary | ICD-10-CM

## 2019-09-19 DIAGNOSIS — Z01419 Encounter for gynecological examination (general) (routine) without abnormal findings: Secondary | ICD-10-CM | POA: Diagnosis not present

## 2019-09-19 DIAGNOSIS — B373 Candidiasis of vulva and vagina: Secondary | ICD-10-CM | POA: Diagnosis not present

## 2019-10-20 ENCOUNTER — Other Ambulatory Visit: Payer: Self-pay | Admitting: Cardiology

## 2019-10-28 DIAGNOSIS — L821 Other seborrheic keratosis: Secondary | ICD-10-CM | POA: Diagnosis not present

## 2019-10-28 DIAGNOSIS — D1801 Hemangioma of skin and subcutaneous tissue: Secondary | ICD-10-CM | POA: Diagnosis not present

## 2019-10-28 DIAGNOSIS — L814 Other melanin hyperpigmentation: Secondary | ICD-10-CM | POA: Diagnosis not present

## 2019-10-28 DIAGNOSIS — L570 Actinic keratosis: Secondary | ICD-10-CM | POA: Diagnosis not present

## 2019-11-11 DIAGNOSIS — H2513 Age-related nuclear cataract, bilateral: Secondary | ICD-10-CM | POA: Diagnosis not present

## 2019-11-11 DIAGNOSIS — Z0100 Encounter for examination of eyes and vision without abnormal findings: Secondary | ICD-10-CM | POA: Diagnosis not present

## 2019-11-11 DIAGNOSIS — H5210 Myopia, unspecified eye: Secondary | ICD-10-CM | POA: Diagnosis not present

## 2019-11-12 DIAGNOSIS — H6982 Other specified disorders of Eustachian tube, left ear: Secondary | ICD-10-CM | POA: Diagnosis not present

## 2019-11-12 DIAGNOSIS — R69 Illness, unspecified: Secondary | ICD-10-CM | POA: Diagnosis not present

## 2019-11-25 ENCOUNTER — Other Ambulatory Visit: Payer: Self-pay

## 2019-11-25 ENCOUNTER — Ambulatory Visit: Payer: Medicare HMO | Admitting: Cardiovascular Disease

## 2019-11-25 ENCOUNTER — Encounter: Payer: Self-pay | Admitting: Cardiovascular Disease

## 2019-11-25 DIAGNOSIS — G4733 Obstructive sleep apnea (adult) (pediatric): Secondary | ICD-10-CM

## 2019-11-25 DIAGNOSIS — K219 Gastro-esophageal reflux disease without esophagitis: Secondary | ICD-10-CM | POA: Diagnosis not present

## 2019-11-25 DIAGNOSIS — Z951 Presence of aortocoronary bypass graft: Secondary | ICD-10-CM

## 2019-11-25 DIAGNOSIS — E785 Hyperlipidemia, unspecified: Secondary | ICD-10-CM

## 2019-11-25 DIAGNOSIS — I10 Essential (primary) hypertension: Secondary | ICD-10-CM

## 2019-11-25 DIAGNOSIS — I2510 Atherosclerotic heart disease of native coronary artery without angina pectoris: Secondary | ICD-10-CM

## 2019-11-25 MED ORDER — ATORVASTATIN CALCIUM 40 MG PO TABS: 40.0000 mg | ORAL_TABLET | Freq: Every day | ORAL | 3 refills | Status: DC

## 2019-11-25 NOTE — Patient Instructions (Signed)
Medication Instructions:  INCREASE YOUR ATORVASTATIN TO 40 MG DAILY   *If you need a refill on your cardiac medications before your next appointment, please call your pharmacy*  Lab Work: [place] [facility]   If you have labs (blood work) drawn today and your tests are completely normal, you will receive your results only by: Marland Kitchen MyChart Message (if you have MyChart) OR . A paper copy in the mail If you have any lab test that is abnormal or we need to change your treatment, we will call you to review the results.  Testing/Procedures: Your physician has requested that you have an echocardiogram. Echocardiography is a painless test that uses sound waves to create images of your heart. It provides your doctor with information about the size and shape of your heart and how well your heart's chambers and valves are working. This procedure takes approximately one hour. There are no restrictions for this procedure. 1 YEAR   Follow-Up: At [facility], you and your health needs are our priority.  As part of our continuing mission to provide you with exceptional heart care, we have created designated Provider Care Teams.  These Care Teams include your primary Cardiologist (physician) and Advanced Practice Providers (APPs -  Physician Assistants and Nurse Practitioners) who all work together to provide you with the care you need, when you need it.  We recommend signing up for the patient portal called "MyChart".  Sign up information is provided on this After Visit Summary.  MyChart is used to connect with patients for Virtual Visits (Telemedicine).  Patients are able to view lab/test results, encounter notes, upcoming appointments, etc.  Non-urgent messages can be sent to your provider as well.   To learn more about what you can do with MyChart, go to [email or web address].    Your next appointment:   12 month(s)  The format for your next appointment:   In  Person  Provider:   You may see DR Claiborne Billings  or one of the following Advanced Practice Providers on your designated Care Team:    Almyra Deforest, PA-C  Fabian Sharp, PA-C or   Roby Lofts, [place]

## 2019-11-25 NOTE — Progress Notes (Signed)
Patient ID: [name] [name], female   DOB: 08-29-1950, 69 y.o.   MRN: [id number]     HPI: [name] [name] is a 69 y.o. female who presents to the office today for a 4 month follow up cardiology evaluation.  [name] [name] has CAD and underwent cardiac catheterization on 05/22/2011 which revealed 70% distal left main stenosis, 60% ostial circumflex stenosis and mild RCA disease.  She underwent CABG surgery x2 by Dr. Nils Pyle and had a LIMA placed to her LAD, and vein to the OM 2 vessel.  Ejection fraction was 50-55%.  In October 2014, she  noticed some vague chest fullness and underwent a nuclear perfusion study which demonstrated hyperdynamic LV with an EF of 83% post stress.  She did not have ECG changes; she had normal perfusion and function.  When I saw her in October 2016  she has been without chest pain.  She has a history of vitamin D insufficiency in the past which had improved.  She has issues with muscle spasm for which and was taking Flexeril on an as-needed basis.  She has GERD for which he takes Protonix on an as-needed basis.  She has been maintained on Lipitor 10 mg for hyperlipidemia.  She had issues with diarrhea.  At that time had stopped her losartan.   She underwent an echo Doppler study in October 2016 which showed an EF of 65-70% with grade 1 diastolic dysfunction.  There was mild TR, and otherwise no significant valvular pathology.  She is unaware of any palpitations.  She denies PND, orthopnea.  She had blood work one month ago by her primary physician and her TSH was 1.5.  BUN and creatinine were 15 and 0.73, respectively.  LFTs were normal, total cholesterol 174 with triglycerides 115, HDL 68, LDL 83.    When I saw her in October 2018 she was having issues with insomnia and was concerned that this could be secondary to losartan.  She was using Benadryl to help her sleep.  Typically she has been going to bed at 9:30, but typically may stay awake and total 1 AM.  If she goes to bed at 1 AM  she falls asleep much more quickly.  She has noticed poor sleep, frequent awakenings, daytime fatigue, and snoring.  She has been sleeping with the head of the bed elevated.  She has an extreme overbite.  During that evaluation, I recommended a trial of zolpidem and to help improve sleep initiation and recommend she undergo a sleep study.  I had not seen her since October 2018 and subsequently saw her in February 2021. In the interim she had  been evaluated [name], PA-C, [name], NP and recently by Dr. Domenic Polite.  In the past, her losartan was discontinued due to complaints of swelling in her throat.  She was started on hydralazine but ultimately developed issues with hydralazine leading to its discontinuance.  More recently she was started on spironolactone and was concerned perhaps that this may be contributing to swelling.  She ultimately underwent a home sleep study in January 2021 which demonstrated mild overall sleep apnea with an AHI of 5.3/h.  Oxygen nadir was 88%.  Because of concerns of her blood pressure medication I saw her on March 20, 2019 for follow-up evaluation with me and has requested that I continue to follow her cardiologically.  During that evaluation, her blood pressure was elevated and due to intolerances to numerous medications I added doxazosin initially 1 mg at  bedtime for 2 weeks with potential titration to 2 mg.  I also discussed options of CPAP therapy versus oral appliance.  She wished to pursue a customized oral appliance as initial treatment and I referred her to Dr. Ron Parker for evaluation.  I recommended aggressive lipid therapy with target LDL less than 70.  I last saw her in June 2021 and over the prior several months her leg swelling had improved but she had developed pain in the back of her calf particularly on the right with cramping. She denied any shortness of breath.  She denied palpitations or chest pain.  Subsequent to that evaluation she had seen Dr. Scarlette Shorts of GI for GERD and dyspepsia.  Presently, she denies any chest pain.  She never followed up with an evaluation for an oral appliance.  She does not believe she is sleeping exceptionally well.  She continues to be on atorvastatin for hyperlipidemia, pantoprazole for GERD and is on spironolactone for blood pressure and swelling.  She now sees Dr. Jacalyn Lefevre at [facility] for primary care.  She presents for evaluation.  Past Surgical History:  Procedure Laterality Date  . ABDOMINAL HYSTERECTOMY  2003  . COLONOSCOPY  2019   Dr. Henrene Pastor  . CORONARY ARTERY BYPASS GRAFT  05/26/2011   Procedure: CORONARY ARTERY BYPASS GRAFTING (CABG);  Surgeon: Ivin Poot, MD;  Location: [place];  Service: Open Heart Surgery;  Laterality: N/A;  Coronary Artery Bypass Graft on Pump times two utlizing left internal mammary artery and right greater saphenous vein harvested endoscopically   . DIAGNOSTIC MAMMOGRAM  2019   [facility]  . LEFT HEART CATHETERIZATION WITH CORONARY ANGIOGRAM N/A 05/24/2011   Procedure: LEFT HEART CATHETERIZATION WITH CORONARY ANGIOGRAM;  Surgeon: Leonie Man, MD;  Location: [facility] C[facility] LAB;  Service: Cardiovascular;  Laterality: N/A;  . TONSILLECTOMY  1962  . TUBAL LIGATION      Allergies  Allergen Reactions  . Losartan Potassium Swelling    "Swelling in her throat"  . Cardura [Doxazosin]     edema  . Amlodipine Swelling  . Cetirizine & Related Other (See Comments)    Extreme Drowsiness - "knocks me loopy for 2 days"  . Latex Rash  . Levofloxacin Nausea And Vomiting  . Lisinopril Other (See Comments)    Excessive mucus production, swelling  . Prednisone Other (See Comments)    High dose - hallucinations  . Tape Rash    Current Outpatient Medications  Medication Sig Dispense Refill  . aspirin EC 81 MG tablet Take 81 mg by mouth daily.    Marland Kitchen atorvastatin (LIPITOR) 40 MG tablet Take 1 tablet (40 mg total) by mouth daily. 90 tablet 3  . conjugated estrogens (PREMARIN)  vaginal cream Place 0.5 Applicatorfuls vaginally once a week.    . pantoprazole (PROTONIX) 40 MG tablet Take 1 tablet (40 mg total) by mouth daily. 60 tablet 6  . spironolactone (ALDACTONE) 25 MG tablet TAKE 2 TABLETS(50 MG) BY MOUTH DAILY 180 tablet 3   No current facility-administered medications for this visit.    Social History   Socioeconomic History  . Marital status: Divorced    Spouse name: single  . Number of children: Not on file  . Years of education: Not on file  . Highest education level: Not on file  Occupational History  . Occupation: Retired   Tobacco Use  . Smoking status: Former Smoker    Packs/day: 0.50    Years: 15.00    Pack years: 7.50  Types: Cigarettes    Quit date: 05/23/2006    Years since quitting: 13.5  . Smokeless tobacco: Never Used  Vaping Use  . Vaping Use: Never used  Substance and Sexual Activity  . Alcohol use: Yes    Alcohol/week: 0.0 standard drinks    Comment: rare glass of wine  . Drug use: No  . Sexual activity: Not Currently    Birth control/protection: Post-menopausal  Other Topics Concern  . Not on file  Social History Narrative   Diet:  Fish and veggies - does not eat a lot of red meat   Do you drink/eat things with caffeine?  2 cups a day   Marital status:  Divorced   Do you live in a house, apartment, assisted living, condo, trailer, etc.)? Renting a one-story house   Is it one or more stories?  One   How many persons live in your home?  Lives alone   Do you have any pets in your home? (please list)  One dog   Education:  Some college   Current or past profession:  CNA   Do you exercise:  Yes - Walks 2-3 times a week.      ADVANCED DIRECTIVES  None.  Does want CPR.       FUNCTIONAL STATUS   Has no difficulties with ADLs.   Social Determinants of Health   Financial Resource Strain:   . Difficulty of Paying Living Expenses: Not on file  Food Insecurity:   . Worried About Charity fundraiser in the Last Year: Not on  file  . Ran Out of Food in the Last Year: Not on file  Transportation Needs:   . Lack of Transportation (Medical): Not on file  . Lack of Transportation (Non-Medical): Not on file  Physical Activity:   . Days of Exercise per Week: Not on file  . Minutes of Exercise per Session: Not on file  Stress:   . Feeling of Stress : Not on file  Social Connections:   . Frequency of Communication with Friends and Family: Not on file  . Frequency of Social Gatherings with Friends and Family: Not on file  . Attends Religious Services: Not on file  . Active Member of Clubs or Organizations: Not on file  . Attends Archivist Meetings: Not on file  . Marital Status: Not on file  Intimate Partner Violence:   . Fear of Current or Ex-Partner: Not on file  . Emotionally Abused: Not on file  . Physically Abused: Not on file  . Sexually Abused: Not on file    Family History  Problem Relation Age of Onset  . Stroke Mother   . Heart failure Mother   . Alcohol abuse Mother   . Mental illness Mother   . Bone cancer Father   . Drug abuse Sister   . Cancer Brother        head and neck cancer  . Alcohol abuse Sister   . Pancreatitis Sister   . Hypertension Daughter   . Fibroids Daughter   . Lung cancer Maternal Grandmother   . Heart disease Maternal Grandfather   . Colon cancer Neg Hx   . Breast cancer Neg Hx   . Esophageal cancer Neg Hx   . Rectal cancer Neg Hx   . Stomach cancer Neg Hx     ROS General: Negative; No fevers, chills, or night sweats HEENT: Negative; No changes in vision or hearing, sinus congestion, difficulty swallowing Pulmonary:  Negative; No cough, wheezing, shortness of breath, hemoptysis Cardiovascular: See HPI: No chest pain, presyncope, syncope, palpatations GI: Positive for GERD, evaluated by Dr. Henrene Pastor;  No nausea, vomiting, diarrhea, or abdominal pain GU: Positive for cyst on kidney; No dysuria, hematuria, or difficulty voiding Musculoskeletal: Positive for  occasional muscle spasm Hematologic: Negative; no easy bruising, bleeding Endocrine: Negative; no heat/cold intolerance; no diabetes, Neuro: Negative; no changes in balance, headaches Skin: Negative; No rashes or skin lesions Psychiatric: Negative; No behavioral problems, depression Sleep: Positive for mild sleep apnea, snoring,  negative; no bruxism, restless legs, hypnogognic hallucinations. Other comprehensive 14 point system review is negative   Physical Exam BP 130/73   Pulse 63   Temp 97.7 F (36.5 C)   Ht $R'5\' 4"'mw$  (1.626 m)   Wt 169 lb (76.7 kg)   SpO2 97%   BMI 29.01 kg/m    Repeat blood pressure by me 124/68  Wt Readings from Last 3 Encounters:  11/25/19 169 lb (76.7 kg)  07/23/19 167 lb 6.4 oz (75.9 kg)  07/21/19 170 lb (77.1 kg)   General: Alert, oriented, no distress.  Skin: normal turgor, no rashes, warm and dry HEENT: Normocephalic, atraumatic. Pupils equal round and reactive to light; sclera anicteric; extraocular muscles intact;  Nose without nasal septal hypertrophy Mouth/Parynx benign; Mallinpatti scale 3 Neck: No JVD, no carotid bruits; normal carotid upstroke Lungs: clear to ausculatation and percussion; no wheezing or rales Chest wall: without tenderness to palpitation Heart: PMI not displaced, RRR, s1 s2 normal, 1/6 systolic murmur, no diastolic murmur, no rubs, gallops, thrills, or heaves Abdomen: soft, nontender; no hepatosplenomehaly, BS+; abdominal aorta nontender and not dilated by palpation. Back: no CVA tenderness Pulses 2+ Musculoskeletal: full range of motion, normal strength, no joint deformities Extremities: no clubbing cyanosis or edema, Homan's sign negative  Neurologic: grossly nonfocal; Cranial nerves grossly wnl Psychologic: Normal mood and affect   ECG (independently read by me): NSR at 63; nonspecific ST changes    June 7, 2021ECG (independently read by me): Sinus bradycardia at 55; nondiagnostic T wave abnormality in  V1-2  04/09/2019 ECG (independently read by me): Sinus Bradycardia at 57; normal intervals  October 2018 ECG (independently read by me): Sinus bradycardia at 58 bpm.  Nonspecific ST-T changes.  QTc interval 394 [name].  PR interval 140 [name].  October 2016 ECG (independently read by me): Sinus bradycardia 53 bpm.  No ectopy.  Normal intervals.  August 2015 ECG (independently read by me): Sinus bradycardia at 47 beats per minute.  Nonspecific T changes.  QTc interval   385 [name]  LABS: BMP Latest Ref Rng & Units 07/19/2019 06/18/2019 03/28/2019  Glucose 70 - 99 mg/dL 105(H) - 106(H)  BUN 8 - 23 mg/dL 25(H) 14 9  Creatinine 0.44 - 1.00 mg/dL 0.79 0.8 0.86  BUN/Creat Ratio 12 - 28 - - -  Sodium 135 - 145 mmol/L 138 140 138  Potassium 3.5 - 5.1 mmol/L 3.8 4.8 4.0  Chloride 98 - 111 mmol/L 102 102 105  CO2 22 - 32 mmol/L 26 24(A) 25  Calcium 8.9 - 10.3 mg/dL 9.7 9.9 9.1   Hepatic Function Latest Ref Rng & Units 06/18/2019 11/04/2018 07/30/2018  Total Protein 6.5 - 8.1 g/dL - 7.4 6.3  Albumin 3.5 - 5.0 4.5 4.3 4.4  AST 13 - 35 $Re'17 16 17  'Dtp$ ALT 7 - 35 $Re'18 21 15  'aIy$ Alk Phosphatase 25 - 125 93 90 77  Total Bilirubin 0.3 - 1.2 mg/dL - 0.5 0.3  CBC Latest Ref Rng & Units 07/19/2019 06/18/2019 11/04/2018  WBC 4.0 - 10.5 K/uL 10.9(H) 9.3 11.4(H)  Hemoglobin 12.0 - 15.0 g/dL 13.1 15.8 15.4(H)  Hematocrit 36 - 46 % 39.9 48(A) 48.4(H)  Platelets 150 - 400 K/uL 386 430(A) 466(H)   Lab Results  Component Value Date   MCV 86.7 07/19/2019   MCV 87.7 11/04/2018   MCV 84 07/30/2018   Lab Results  Component Value Date   TSH 1.41 06/18/2019   Lab Results  Component Value Date   HGBA1C 6.0 11/05/2017   Lipid Panel     Component Value Date/Time   CHOL 166 06/18/2019 0000   CHOL 160 07/30/2018 0948   TRIG 95 06/18/2019 0000   HDL 64 06/18/2019 0000   HDL 64 07/30/2018 0948   CHOLHDL 2.5 07/30/2018 0948   CHOLHDL 2.3 11/18/2014 0928   VLDL 15 11/18/2014 0928   LDLCALC 85 06/18/2019 0000   LDLCALC 79 07/30/2018  0948   RADIOLOGY: No results found.  IMPRESSION:  1. CAD in native artery   2. S/P CABG x 2   3. Essential hypertension   4. Hyperlipidemia LDL goal <70   5. OSA (obstructive sleep apnea)   6. Gastroesophageal reflux disease without esophagitis     ASSESSMENT AND PLAN: [name]. Nehemie Casserly is a very pleasant 69 year old female, who underwent CABG revascularization surgery in April 2013 after she was found to have high-grade left main stenosis.  A  2014 nuclear study showed normal perfusion and hyperdynamic LV function.  She is not having any anginal symptomatology.  She has had numerous intolerances to medications and reportedly could not tolerate losartan, hydralazine, amlodipine, lisinopril.  She was subsequently started on spironolactone which has been titrated to 25 mg twice a day.  When I saw her in June, she had complaints of lower extremity discomfort.  A venous Doppler study was negative for DVT.  Her blood pressure today is stable on her current regimen.  She has been on atorvastatin 20 mg and LDL cholesterol in May 2021 remain elevated at 85.  I have suggested she further titrate atorvastatin to 40 mg with target LDL less than 70.  She now sees Dr. Jacalyn Lefevre at [facility].  She has mild sleep apnea, currently untreated.  She did not want to pursue CPAP and never followed up with obtaining a customized oral appliance.  She prefers not to pursue this presently.  She will be following up with her primary care physician.  Her GERD is stable on pantoprazole.  As long as she remains cardiac stable I will see her in 1 year for evaluation and prior to that evaluation I have recommended a 5-year follow-up echo Doppler study  Troy Sine, MD, [facility]  11/27/2019 3:09 PM

## 2019-11-26 ENCOUNTER — Telehealth: Payer: Self-pay | Admitting: Cardiovascular Disease

## 2019-11-26 NOTE — Telephone Encounter (Signed)
    Pt would like to know the person who did her EKG yesterday. She said if she can call her back, she would like to apologize to her.

## 2019-11-26 NOTE — Telephone Encounter (Signed)
LMTCB

## 2019-11-26 NOTE — Telephone Encounter (Signed)
Patient returning phone call 

## 2019-11-27 ENCOUNTER — Encounter: Payer: Self-pay | Admitting: Cardiovascular Disease

## 2019-11-28 NOTE — Telephone Encounter (Signed)
Spoke with pt, aware jeanie was the person that did her EKG. Patient reports that she was very rude and feels bad about the way she acted. She had not had any sleep but feels like that is not an excuse. Will make jeanie aware.

## 2019-12-03 ENCOUNTER — Telehealth: Payer: Self-pay | Admitting: Cardiovascular Disease

## 2019-12-03 NOTE — Telephone Encounter (Signed)
LMTCB

## 2019-12-03 NOTE — Telephone Encounter (Signed)
New message:     Patient calling that she increased her medication atorvastatin, patient went from 20 mg to 40 mg. Patient broke out in hives last night. Please call patient.

## 2019-12-05 ENCOUNTER — Other Ambulatory Visit: Payer: Self-pay

## 2019-12-05 ENCOUNTER — Emergency Department (HOSPITAL_COMMUNITY)
Admission: EM | Admit: 2019-12-05 | Discharge: 2019-12-05 | Disposition: A | Discharge status: Home / Self Care | Payer: Medicare HMO

## 2019-12-08 MED ORDER — ATORVASTATIN CALCIUM 20 MG PO TABS: 20.0000 mg | ORAL_TABLET | Freq: Every day | ORAL | 1 refills | Status: DC

## 2019-12-08 NOTE — Telephone Encounter (Signed)
Instructed to hold atorvastatin therapy until next week, then resume at previous dose pf 20mg  daily.  Recommendation to schedule appointment with lipid clinic given. Waiting for patient response.

## 2019-12-17 ENCOUNTER — Other Ambulatory Visit: Payer: Self-pay

## 2019-12-17 ENCOUNTER — Ambulatory Visit
Admission: RE | Admit: 2019-12-17 | Discharge: 2019-12-17 | Disposition: A | Discharge status: Home / Self Care | Payer: Medicare HMO | Source: Ambulatory Visit | Attending: Internal Medicine | Admitting: Internal Medicine

## 2019-12-17 DIAGNOSIS — Z1231 Encounter for screening mammogram for malignant neoplasm of breast: Secondary | ICD-10-CM | POA: Diagnosis not present

## 2019-12-18 DIAGNOSIS — M722 Plantar fascial fibromatosis: Secondary | ICD-10-CM | POA: Diagnosis not present

## 2019-12-18 DIAGNOSIS — M79671 Pain in right foot: Secondary | ICD-10-CM | POA: Diagnosis not present

## 2019-12-18 DIAGNOSIS — M7731 Calcaneal spur, right foot: Secondary | ICD-10-CM | POA: Diagnosis not present

## 2019-12-19 ENCOUNTER — Ambulatory Visit: Payer: Medicare HMO | Admitting: Podiatry

## 2019-12-30 DIAGNOSIS — R509 Fever, unspecified: Secondary | ICD-10-CM | POA: Diagnosis not present

## 2019-12-30 DIAGNOSIS — H9209 Otalgia, unspecified ear: Secondary | ICD-10-CM | POA: Diagnosis not present

## 2019-12-30 DIAGNOSIS — R0981 Nasal congestion: Secondary | ICD-10-CM | POA: Diagnosis not present

## 2019-12-30 DIAGNOSIS — Z20822 Contact with and (suspected) exposure to covid-19: Secondary | ICD-10-CM | POA: Diagnosis not present

## 2019-12-30 DIAGNOSIS — R42 Dizziness and giddiness: Secondary | ICD-10-CM | POA: Diagnosis not present

## 2019-12-30 DIAGNOSIS — H6693 Otitis media, unspecified, bilateral: Secondary | ICD-10-CM | POA: Diagnosis not present

## 2019-12-30 DIAGNOSIS — R300 Dysuria: Secondary | ICD-10-CM | POA: Diagnosis not present

## 2019-12-31 ENCOUNTER — Other Ambulatory Visit: Payer: Medicare HMO

## 2020-01-02 DIAGNOSIS — R251 Tremor, unspecified: Secondary | ICD-10-CM | POA: Diagnosis not present

## 2020-01-02 DIAGNOSIS — I10 Essential (primary) hypertension: Secondary | ICD-10-CM | POA: Diagnosis not present

## 2020-01-02 DIAGNOSIS — E785 Hyperlipidemia, unspecified: Secondary | ICD-10-CM | POA: Diagnosis not present

## 2020-01-02 DIAGNOSIS — R69 Illness, unspecified: Secondary | ICD-10-CM | POA: Diagnosis not present

## 2020-01-02 DIAGNOSIS — M79672 Pain in left foot: Secondary | ICD-10-CM | POA: Diagnosis not present

## 2020-01-02 DIAGNOSIS — M859 Disorder of bone density and structure, unspecified: Secondary | ICD-10-CM | POA: Diagnosis not present

## 2020-01-02 DIAGNOSIS — M8580 Other specified disorders of bone density and structure, unspecified site: Secondary | ICD-10-CM | POA: Diagnosis not present

## 2020-01-02 DIAGNOSIS — R7303 Prediabetes: Secondary | ICD-10-CM | POA: Diagnosis not present

## 2020-01-10 ENCOUNTER — Other Ambulatory Visit: Payer: Self-pay | Admitting: Internal Medicine

## 2020-01-15 ENCOUNTER — Ambulatory Visit: Payer: Medicare HMO | Admitting: Orthopaedic Surgery

## 2020-02-18 ENCOUNTER — Encounter: Payer: Self-pay | Admitting: Orthopaedic Surgery

## 2020-02-18 ENCOUNTER — Ambulatory Visit: Payer: Self-pay

## 2020-02-18 ENCOUNTER — Ambulatory Visit (INDEPENDENT_AMBULATORY_CARE_PROVIDER_SITE_OTHER): Payer: Medicare HMO | Admitting: Orthopaedic Surgery

## 2020-02-18 ENCOUNTER — Other Ambulatory Visit: Payer: Self-pay

## 2020-02-18 VITALS — BP 151/85 | HR 78 | Ht 64.0 in | Wt 167.5 lb

## 2020-02-18 DIAGNOSIS — M79671 Pain in right foot: Secondary | ICD-10-CM | POA: Diagnosis not present
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
## 2020-02-26 ENCOUNTER — Encounter: Payer: Self-pay | Admitting: Orthopaedic Surgery

## 2020-02-27 ENCOUNTER — Other Ambulatory Visit: Payer: Self-pay | Admitting: Internal Medicine

## 2020-03-02 ENCOUNTER — Other Ambulatory Visit: Payer: Self-pay

## 2020-03-02 ENCOUNTER — Ambulatory Visit: Payer: Medicare HMO | Admitting: Orthopaedic Surgery

## 2020-03-02 ENCOUNTER — Ambulatory Visit: Payer: Self-pay | Admitting: Orthopaedic Surgery

## 2020-03-02 ENCOUNTER — Encounter: Payer: Self-pay | Admitting: Orthopaedic Surgery

## 2020-03-02 VITALS — BP 175/89 | HR 78 | Ht 64.0 in | Wt 165.0 lb

## 2020-03-02 DIAGNOSIS — G8929 Other chronic pain: Secondary | ICD-10-CM

## 2020-03-02 DIAGNOSIS — M79671 Pain in right foot: Secondary | ICD-10-CM

## 2020-03-02 MED ORDER — PREDNISONE 5 MG (21) PO TBPK
ORAL_TABLET | ORAL | 0 refills | Status: DC
Start: 2020-03-02 — End: 2020-06-21
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
## 2020-03-03 ENCOUNTER — Emergency Department (HOSPITAL_COMMUNITY)
Admission: EM | Admit: 2020-03-03 | Discharge: 2020-03-03 | Disposition: A | Discharge status: Home / Self Care | Payer: Medicare HMO | Attending: Emergency Medicine | Admitting: Emergency Medicine

## 2020-03-03 ENCOUNTER — Other Ambulatory Visit: Payer: Self-pay

## 2020-03-03 ENCOUNTER — Encounter (HOSPITAL_COMMUNITY): Payer: Self-pay

## 2020-03-03 DIAGNOSIS — Z91040 Latex allergy status: Secondary | ICD-10-CM | POA: Diagnosis not present

## 2020-03-03 DIAGNOSIS — I10 Essential (primary) hypertension: Secondary | ICD-10-CM | POA: Diagnosis not present

## 2020-03-03 DIAGNOSIS — Z7982 Long term (current) use of aspirin: Secondary | ICD-10-CM | POA: Insufficient documentation

## 2020-03-03 DIAGNOSIS — Z951 Presence of aortocoronary bypass graft: Secondary | ICD-10-CM | POA: Insufficient documentation

## 2020-03-03 DIAGNOSIS — N390 Urinary tract infection, site not specified: Secondary | ICD-10-CM | POA: Diagnosis not present

## 2020-03-03 DIAGNOSIS — Z79899 Other long term (current) drug therapy: Secondary | ICD-10-CM | POA: Diagnosis not present

## 2020-03-03 DIAGNOSIS — I2510 Atherosclerotic heart disease of native coronary artery without angina pectoris: Secondary | ICD-10-CM | POA: Diagnosis not present

## 2020-03-03 DIAGNOSIS — Z87891 Personal history of nicotine dependence: Secondary | ICD-10-CM | POA: Insufficient documentation

## 2020-03-03 DIAGNOSIS — R319 Hematuria, unspecified: Secondary | ICD-10-CM

## 2020-03-03 LAB — BASIC METABOLIC PANEL
Anion gap: 11 (ref 5–15)
BUN: 23 mg/dL (ref 8–23)
CO2: 24 mmol/L (ref 22–32)
Calcium: 9.1 mg/dL (ref 8.9–10.3)
Chloride: 102 mmol/L (ref 98–111)
Creatinine, Ser: 0.83 mg/dL (ref 0.44–1.00)
GFR, Estimated: >60 mL/min (ref >60)
Glucose, Bld: 98 mg/dL (ref 70–99)
Potassium: 3.6 mmol/L (ref 3.5–5.1)
Sodium: 137 mmol/L (ref 135–145)

## 2020-03-03 LAB — URINALYSIS, ROUTINE W REFLEX MICROSCOPIC

## 2020-03-03 LAB — CBC
HCT: 46.3 % — ABNORMAL HIGH (ref 36.0–46.0)
Hemoglobin: 14.9 g/dL (ref 12.0–15.0)
MCH: 28.1 pg (ref 26.0–34.0)
MCHC: 32.2 g/dL (ref 30.0–36.0)
MCV: 87.2 fL (ref 80.0–100.0)
Platelets: 421 10*3/uL — ABNORMAL HIGH (ref 150–400)
RBC: 5.31 MIL/uL — ABNORMAL HIGH (ref 3.87–5.11)
RDW: 13.5 % (ref 11.5–15.5)
WBC: 10.5 10*3/uL (ref 4.0–10.5)
nRBC: 0 % (ref 0.0–0.2)

## 2020-03-03 LAB — URINALYSIS, MICROSCOPIC (REFLEX): RBC / HPF: >50 RBC/hpf (ref 0–5)

## 2020-03-03 MED ORDER — CEPHALEXIN 500 MG PO CAPS
500.0000 mg | ORAL_CAPSULE | Freq: Once | ORAL | Status: AC
  Administered 2020-03-03: 500 mg via ORAL
  Filled 2020-03-03: qty 1

## 2020-03-03 MED ORDER — CEPHALEXIN 500 MG PO CAPS: 500.0000 mg | ORAL_CAPSULE | Freq: Four times a day (QID) | ORAL | 0 refills | Status: AC

## 2020-03-03 NOTE — Discharge Instructions (Addendum)
Take antibiotics as prescribed. Take the entire course, even if symptoms improve.  Make sure you stay well hydrated with water.  Continue taking home medications as prescribed.  Follow up with your primary care doctor as needed for recheck or if symptoms continue.  Return to the ER if you develop high fevers, persistent vomiting, severe worsening pain, or any new or concerning symptoms.

## 2020-03-03 NOTE — ED Notes (Signed)
During assessment, c/o dizziness. Franchot Heidelberg, PA notified.

## 2020-03-03 NOTE — ED Triage Notes (Signed)
Pt presents to ED with blood in urine and burning when she urinates, started this am.
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
## 2020-03-03 NOTE — ED Notes (Signed)
Pt reports having foul smelling urine for about 6 months.  However, bleeding started this am.

## 2020-03-04 ENCOUNTER — Telehealth: Payer: Self-pay

## 2020-03-04 NOTE — Telephone Encounter (Signed)
Appt made.  Patient aware  

## 2020-03-06 LAB — URINE CULTURE: Culture: 40000 — AB

## 2020-03-10 ENCOUNTER — Ambulatory Visit (INDEPENDENT_AMBULATORY_CARE_PROVIDER_SITE_OTHER): Payer: Medicare HMO | Admitting: Family Medicine

## 2020-03-10 ENCOUNTER — Other Ambulatory Visit: Payer: Self-pay

## 2020-03-10 ENCOUNTER — Encounter: Payer: Self-pay | Admitting: Family Medicine

## 2020-03-10 VITALS — BP 138/75 | HR 63 | Temp 97.9°F | Ht 64.0 in | Wt 170.0 lb

## 2020-03-10 DIAGNOSIS — R251 Tremor, unspecified: Secondary | ICD-10-CM | POA: Diagnosis not present

## 2020-03-10 DIAGNOSIS — Z7689 Persons encountering health services in other specified circumstances: Secondary | ICD-10-CM

## 2020-03-10 DIAGNOSIS — M79671 Pain in right foot: Secondary | ICD-10-CM

## 2020-03-10 DIAGNOSIS — Z87440 Personal history of urinary (tract) infections: Secondary | ICD-10-CM | POA: Diagnosis not present

## 2020-03-10 DIAGNOSIS — N390 Urinary tract infection, site not specified: Secondary | ICD-10-CM | POA: Diagnosis not present

## 2020-03-10 LAB — URINALYSIS, COMPLETE
Bilirubin, UA: NEGATIVE
Glucose, UA: NEGATIVE
Ketones, UA: NEGATIVE
Leukocytes,UA: NEGATIVE
Nitrite, UA: NEGATIVE
Protein,UA: NEGATIVE
RBC, UA: NEGATIVE
Specific Gravity, UA: <1.005 — ABNORMAL LOW (ref 1.005–1.030)
Urobilinogen, Ur: 0.2 mg/dL (ref 0.2–1.0)
pH, UA: 5 (ref 5.0–7.5)

## 2020-03-10 LAB — MICROSCOPIC EXAMINATION
Bacteria, UA: NONE SEEN
Epithelial Cells (non renal): NONE SEEN /hpf (ref 0–10)
RBC, Urine: NONE SEEN /hpf (ref 0–2)
WBC, UA: NONE SEEN /hpf (ref 0–5)

## 2020-03-10 NOTE — Patient Instructions (Signed)
Referral to podiatry placed. Motrin OR Meloxicam but not both.  Keep an eye on tremor.  Let me know if we need referral to Neurology  Will contact with urine culture results once available.
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
## 2020-03-12 LAB — URINE CULTURE: Organism ID, Bacteria: NO GROWTH

## 2020-03-17 ENCOUNTER — Ambulatory Visit: Payer: Medicare HMO | Admitting: Family Medicine

## 2020-03-17 ENCOUNTER — Ambulatory Visit: Payer: Medicare HMO | Admitting: Orthopaedic Surgery

## 2020-03-22 ENCOUNTER — Ambulatory Visit: Payer: Medicare HMO | Admitting: Podiatry

## 2020-03-22 ENCOUNTER — Other Ambulatory Visit: Payer: Self-pay

## 2020-03-22 ENCOUNTER — Ambulatory Visit (INDEPENDENT_AMBULATORY_CARE_PROVIDER_SITE_OTHER): Payer: Medicare HMO

## 2020-03-22 DIAGNOSIS — M722 Plantar fascial fibromatosis: Secondary | ICD-10-CM

## 2020-03-22 MED ORDER — BETAMETHASONE SOD PHOS & ACET 6 (3-3) MG/ML IJ SUSP
3.0000 mg | Freq: Once | INTRAMUSCULAR | Status: AC
  Administered 2020-03-22: 3 mg via INTRA_ARTICULAR

## 2020-03-22 MED ORDER — MELOXICAM 15 MG PO TABS: 15.0000 mg | ORAL_TABLET | Freq: Every day | ORAL | 1 refills | Status: DC
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
## 2020-03-25 ENCOUNTER — Other Ambulatory Visit: Payer: Self-pay

## 2020-03-25 ENCOUNTER — Ambulatory Visit
Admission: EM | Admit: 2020-03-25 | Discharge: 2020-03-25 | Disposition: A | Discharge status: Home / Self Care | Payer: Medicare HMO | Attending: Family Medicine | Admitting: Family Medicine

## 2020-03-25 DIAGNOSIS — Z7689 Persons encountering health services in other specified circumstances: Secondary | ICD-10-CM | POA: Diagnosis not present

## 2020-03-25 DIAGNOSIS — Z1152 Encounter for screening for COVID-19: Secondary | ICD-10-CM

## 2020-03-25 NOTE — ED Triage Notes (Signed)
Pt presents with felling unwell with nasal congestion, cough and fever

## 2020-03-27 LAB — COVID-19, FLU A+B NAA
Influenza A, NAA: NOT DETECTED
Influenza B, NAA: NOT DETECTED
SARS-CoV-2, NAA: DETECTED — AB

## 2020-04-06 ENCOUNTER — Telehealth: Payer: Self-pay | Admitting: Cardiovascular Disease

## 2020-04-06 NOTE — Telephone Encounter (Signed)
Pt c/o swelling: STAT is pt has developed SOB within 24 hours  1) How much weight have you gained and in what time span? None   2) If swelling, where is the swelling located? Knee   3) Are you currently taking a fluid pill? Yes   4) Are you currently SOB? No  5) Do you have a log of your daily weights (if so, list)? no  6) Have you gained 3 pounds in a day or 5 pounds in a week?  Mot sure  7) Have you traveled recently? No Patient states she is in so much  pain

## 2020-04-06 NOTE — Telephone Encounter (Signed)
Spoke to patient she stated since this past Saturday she has been having severe pain in right knee.Stated she has been taking Tramadol.Advised to call PCP.

## 2020-04-07 ENCOUNTER — Ambulatory Visit: Payer: Medicare HMO | Admitting: Family Medicine

## 2020-04-12 ENCOUNTER — Ambulatory Visit: Payer: Medicare HMO | Admitting: Podiatry

## 2020-04-13 ENCOUNTER — Encounter: Payer: Self-pay | Admitting: Family Medicine

## 2020-04-17 ENCOUNTER — Encounter: Payer: Self-pay | Admitting: Family Medicine

## 2020-04-22 ENCOUNTER — Other Ambulatory Visit: Payer: Self-pay | Admitting: Pharmacist Clinician (PhC)/ Clinical Pharmacy Specialist

## 2020-04-22 MED ORDER — ROSUVASTATIN CALCIUM 10 MG PO TABS: 10.0000 mg | ORAL_TABLET | Freq: Every day | ORAL | 3 refills | Status: DC

## 2020-04-26 ENCOUNTER — Ambulatory Visit: Payer: Medicare HMO | Admitting: Podiatry

## 2020-04-26 ENCOUNTER — Telehealth: Payer: Self-pay | Admitting: Podiatry

## 2020-04-26 ENCOUNTER — Other Ambulatory Visit: Payer: Self-pay

## 2020-04-26 DIAGNOSIS — M722 Plantar fascial fibromatosis: Secondary | ICD-10-CM | POA: Diagnosis not present

## 2020-04-26 MED ORDER — TRAMADOL HCL 50 MG PO TABS: 50.0000 mg | ORAL_TABLET | Freq: Four times a day (QID) | ORAL | 0 refills | Status: AC | PRN
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
## 2020-04-26 NOTE — Telephone Encounter (Signed)
Pt called and talked with her insurance company and they have told her they are covered but it needs an authorization. She said she told them she was prediabetic.   I explained that they say this but they have to meet specific qualifications and they do not meet them. I explained that there are diabetic inserts but pt has to be diabetic to get those covered (prediabetic wiil not cover them) Pt then gave me 2 numbers for prior authorization and I told I would try but it would be later this week and she asked if I would call her back and give her the information. If they are not covered pt is going to call the insurance company back.

## 2020-04-29 ENCOUNTER — Other Ambulatory Visit: Payer: Self-pay | Admitting: Internal Medicine

## 2020-04-29 NOTE — Telephone Encounter (Signed)
Left message for pt that I have called [employer] and spoke to 2 people and was told no Josem Kaufmann is required for orthotics(L3020) but to call if she needed to discuss further.

## 2020-04-29 NOTE — Telephone Encounter (Signed)
Pt called back and states she has been told 2 times by her insurance that the cover the orthotics(L3020) and would like to proceed. She stated she did step in foam box when she was here.

## 2020-05-17 ENCOUNTER — Ambulatory Visit (INDEPENDENT_AMBULATORY_CARE_PROVIDER_SITE_OTHER): Payer: Medicare HMO | Admitting: *Deleted

## 2020-05-17 ENCOUNTER — Other Ambulatory Visit: Payer: Self-pay

## 2020-05-17 DIAGNOSIS — M722 Plantar fascial fibromatosis: Secondary | ICD-10-CM

## 2020-05-17 NOTE — Progress Notes (Signed)
Patient presents today to pick up custom molded foot orthotics, diagnosed with plantar fasciitis by Dr. Paulla Dolly.   Orthotics were dispensed and fit was satisfactory. Reviewed instructions for break-in and wear. Written instructions given to patient.  Patient will follow up as needed.   [facility] - order # [id number]

## 2020-06-10 ENCOUNTER — Other Ambulatory Visit: Payer: Self-pay | Admitting: Family Medicine

## 2020-06-10 DIAGNOSIS — R5381 Other malaise: Secondary | ICD-10-CM

## 2020-06-21 MED ORDER — ATORVASTATIN CALCIUM 20 MG PO TABS: 20.0000 mg | ORAL_TABLET | Freq: Every day | ORAL | 3 refills | Status: DC

## 2020-07-02 ENCOUNTER — Other Ambulatory Visit: Payer: Self-pay

## 2020-07-02 ENCOUNTER — Encounter: Payer: Self-pay | Admitting: Family Medicine

## 2020-07-02 ENCOUNTER — Ambulatory Visit (INDEPENDENT_AMBULATORY_CARE_PROVIDER_SITE_OTHER): Payer: Medicare HMO | Admitting: Family Medicine

## 2020-07-02 VITALS — BP 155/73 | HR 50 | Temp 97.7°F | Ht 64.0 in | Wt 163.2 lb

## 2020-07-02 DIAGNOSIS — I2510 Atherosclerotic heart disease of native coronary artery without angina pectoris: Secondary | ICD-10-CM | POA: Diagnosis not present

## 2020-07-02 DIAGNOSIS — G629 Polyneuropathy, unspecified: Secondary | ICD-10-CM | POA: Diagnosis not present

## 2020-07-02 DIAGNOSIS — R6889 Other general symptoms and signs: Secondary | ICD-10-CM | POA: Diagnosis not present

## 2020-07-02 DIAGNOSIS — E785 Hyperlipidemia, unspecified: Secondary | ICD-10-CM

## 2020-07-02 DIAGNOSIS — I10 Essential (primary) hypertension: Secondary | ICD-10-CM | POA: Diagnosis not present

## 2020-07-02 DIAGNOSIS — R140 Abdominal distension (gaseous): Secondary | ICD-10-CM | POA: Diagnosis not present

## 2020-07-02 DIAGNOSIS — R7989 Other specified abnormal findings of blood chemistry: Secondary | ICD-10-CM | POA: Diagnosis not present

## 2020-07-02 DIAGNOSIS — R42 Dizziness and giddiness: Secondary | ICD-10-CM

## 2020-07-02 LAB — BAYER DCA HB A1C WAIVED: HB A1C (BAYER DCA - WAIVED): 5.9 % (ref <7.0)

## 2020-07-02 NOTE — Patient Instructions (Signed)
Call GI for possible EGD, given abdominal discomfort/ fullness ?hiatal hernia  Nasal saline spray for nose.  If no improvement in 1-2 months, call me and will send you to ENT  Checking labs for metabolic causes of neuropathy in feet  Epply maneuver for vertigo if it recurs.   You had labs performed today.  You will be contacted with the results of the labs once they are available, usually in the next 3 business days for routine lab work.  If you have an active my chart account, they will be released to your MyChart.  If you prefer to have these labs released to you via telephone, please let us know.  If you had a pap smear or biopsy performed, expect to be contacted in about 7-10 days.
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
## 2020-07-03 LAB — CMP14+EGFR
ALT: 10 IU/L (ref 0–32)
AST: 12 IU/L (ref 0–40)
Albumin/Globulin Ratio: 2.3 — ABNORMAL HIGH (ref 1.2–2.2)
Albumin: 4.5 g/dL (ref 3.8–4.8)
Alkaline Phosphatase: 85 IU/L (ref 44–121)
BUN/Creatinine Ratio: 14 (ref 12–28)
BUN: 12 mg/dL (ref 8–27)
Bilirubin Total: 0.4 mg/dL (ref 0.0–1.2)
CO2: 21 mmol/L (ref 20–29)
Calcium: 9.9 mg/dL (ref 8.7–10.3)
Chloride: 101 mmol/L (ref 96–106)
Creatinine, Ser: 0.88 mg/dL (ref 0.57–1.00)
Globulin, Total: 2 g/dL (ref 1.5–4.5)
Glucose: 95 mg/dL (ref 65–99)
Potassium: 4.8 mmol/L (ref 3.5–5.2)
Sodium: 141 mmol/L (ref 134–144)
Total Protein: 6.5 g/dL (ref 6.0–8.5)
eGFR: 71 mL/min/{1.73_m2} (ref >59)

## 2020-07-03 LAB — CBC
Hematocrit: 45.7 % (ref 34.0–46.6)
Hemoglobin: 15.1 g/dL (ref 11.1–15.9)
MCH: 28 pg (ref 26.6–33.0)
MCHC: 33 g/dL (ref 31.5–35.7)
MCV: 85 fL (ref 79–97)
Platelets: 416 10*3/uL (ref 150–450)
RBC: 5.4 x10E6/uL — ABNORMAL HIGH (ref 3.77–5.28)
RDW: 13.6 % (ref 11.7–15.4)
WBC: 7 10*3/uL (ref 3.4–10.8)

## 2020-07-03 LAB — VITAMIN B12: Vitamin B-12: 267 pg/mL (ref 232–1245)

## 2020-07-03 LAB — LIPID PANEL
Chol/HDL Ratio: 2.7 ratio (ref 0.0–4.4)
Cholesterol, Total: 157 mg/dL (ref 100–199)
HDL: 58 mg/dL (ref >39)
LDL Chol Calc (NIH): 83 mg/dL (ref 0–99)
Triglycerides: 85 mg/dL (ref 0–149)
VLDL Cholesterol Cal: 16 mg/dL (ref 5–40)

## 2020-07-03 LAB — TSH: TSH: 1.22 u[IU]/mL (ref 0.450–4.500)

## 2020-07-05 ENCOUNTER — Encounter: Payer: Self-pay | Admitting: Family Medicine

## 2020-07-05 ENCOUNTER — Telehealth: Payer: Self-pay

## 2020-07-05 NOTE — Telephone Encounter (Signed)
Spoke with patient, scheduled her with Amy Esterwood for 07/26/2020 at 9:30am

## 2020-07-14 ENCOUNTER — Telehealth: Payer: Self-pay | Admitting: Cardiovascular Disease

## 2020-07-14 NOTE — Telephone Encounter (Signed)
[name] [name] is returning Lisa's call. Please advise.

## 2020-07-14 NOTE — Telephone Encounter (Signed)
Left a message for the patient to call back.  

## 2020-07-14 NOTE — Telephone Encounter (Signed)
Returned call to patient who states that she is having issues with her lipitor, and states that she had previously stopped it but states that she did not remember if the symptoms related to her feet pain had gotten better or not. Patient states that she has been off the medication again for several days and states that her feet pain has gotten better. Patient states she saw Ortho and was told by them that she has plantar fasciitis. Patient states that she does not feel like it is fasciitis but instead feels like she has neuropathy or PAD. Patient states that her pain is only in the bottom of her feet and is worse in the mornings. Patient states she sometimes has numbness as well. Patient states that she sometimes has slight swelling in her ankles that is resolved with compression stockings. Patient denies any current swelling, calf pain, redness, or any other symptoms at this time. Patient states that she wants an appointment to talk with someone regarding her issues. Made patient an appointment to see Coletta Memos on 6/23. Advised patient that I would forward message to Dr. Claiborne Billings to make him aware. Patient verbalized understanding.

## 2020-07-14 NOTE — Telephone Encounter (Signed)
Pt c/o medication issue:  1. Name of Medication: atorvastatin (LIPITOR) 20 MG tablet  2. How are you currently taking this medication (dosage and times per day)? Quit taking as of yesterday per pt message  3. Are you having a reaction (difficulty breathing--STAT)? Problems with feet  4. What is your medication issue? Patient sent message to scheduling trying to get worked in on the same day around the same time as her pharmacy appt with [place] on 06/21. States she is having problems with her feet and would like to be checked for peripheral neuropathy.

## 2020-07-14 NOTE — Telephone Encounter (Signed)
Patient is returning Lisa's call

## 2020-07-19 ENCOUNTER — Other Ambulatory Visit: Payer: Self-pay | Admitting: Cardiovascular Disease

## 2020-07-19 NOTE — Telephone Encounter (Signed)
I suspect the pain is not related to Lipitor.  She may very well have peripheral neuropathy contributing to her symptomatology

## 2020-07-21 NOTE — Telephone Encounter (Signed)
Advised patient of Dr. Evette Georges recommendations and advice. Patient wanted to make Dr. Claiborne Billings aware that she has stopped the Lipitor and states that her pain is better in her feet. Patient cancelled her appointment on 6/23 due to scheduling conflicts. Patient states she will call to reschedule appointment in July. Advised patient that I would forward message to Dr. Claiborne Billings to make him aware she is no longer taking the Lipitor and that her pain is better. Advised patient to call back to office with any issues, questions, or concerns. Patient verbalized understanding.

## 2020-07-26 ENCOUNTER — Ambulatory Visit: Payer: Medicare HMO | Admitting: Physician Assistant

## 2020-07-26 ENCOUNTER — Encounter: Payer: Self-pay | Admitting: Physician Assistant

## 2020-07-26 VITALS — BP 134/78 | HR 84 | Ht 64.0 in | Wt 166.6 lb

## 2020-07-26 DIAGNOSIS — B370 Candidal stomatitis: Secondary | ICD-10-CM

## 2020-07-26 DIAGNOSIS — R112 Nausea with vomiting, unspecified: Secondary | ICD-10-CM

## 2020-07-26 DIAGNOSIS — K219 Gastro-esophageal reflux disease without esophagitis: Secondary | ICD-10-CM | POA: Diagnosis not present

## 2020-07-26 MED ORDER — NYSTATIN 100000 UNIT/ML MT SUSP: OROMUCOSAL | 0 refills | Status: DC

## 2020-07-26 MED ORDER — ONDANSETRON HCL 4 MG PO TABS: 4.0000 mg | ORAL_TABLET | Freq: Four times a day (QID) | ORAL | 3 refills | Status: DC | PRN

## 2020-07-26 MED ORDER — PANTOPRAZOLE SODIUM 40 MG PO TBEC
1.0000 | DELAYED_RELEASE_TABLET | Freq: Every day | ORAL | 11 refills | Status: DC
Start: 2020-07-26 — End: 2021-12-14
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
## 2020-07-26 NOTE — Progress Notes (Signed)
Assessment and plan reviewed 

## 2020-07-26 NOTE — Patient Instructions (Addendum)
If you are age 69 or older, your body mass index should be between 23-30. Your Body mass index is 28.6 kg/m. If this is out of the aforementioned range listed, please consider follow up with your Primary Care Provider. __________________________________________________________  The [facility] providers would like to encourage you to use [facility] to communicate with providers for non-urgent requests or questions.  Due to long hold times on the telephone, sending your provider a message by [facility] may be a faster and more efficient way to get a response.  Please allow 48 business hours for a response.  Please remember that this is for non-urgent requests.   The following medications were sent to your pharmacy: Pantoprazole, Ondansetron, Nystatin  Follow a bland diet  Call the office in 2 weeks if symptoms persist, ask for Amy's nurse Beth.  Thank you for entrusting me with your care and choosing [facility].  Amy Esterwood, PA-C

## 2020-07-27 ENCOUNTER — Telehealth: Payer: Self-pay

## 2020-07-27 NOTE — Telephone Encounter (Signed)
-----   Message from Alfredia Ferguson, PA-C sent at 07/26/2020  2:41 PM EDT ----- Regarding: call pt Beth, I saw this patient in clinic today.  One of the things that she was concerned about seem to be swelling in her breasts or enlargement of her breasts.  Please call her and let her know that I reviewed her meds and she is on Aldactone/spironolactone which I think she is being given for peripheral edema.  Let her know that this can cause breast pain and can cause gynecomastia which is enlargement of the breast.  I suggest she calls her PCP or whoever is prescribing this for her and ask to come off of this and be given an alternative medication

## 2020-07-27 NOTE — Telephone Encounter (Signed)
Called the patient. No answer. Left her a message to return my call for information Amy wanted shared with her.

## 2020-07-28 ENCOUNTER — Telehealth: Payer: Self-pay | Admitting: Cardiovascular Disease

## 2020-07-28 MED ORDER — EPLERENONE 50 MG PO TABS
50.0000 mg | ORAL_TABLET | Freq: Every day | ORAL | 3 refills | Status: DC
Start: 2020-07-28 — End: 2020-09-14

## 2020-07-28 NOTE — Telephone Encounter (Signed)
Okay to stop Spironolactone and start taking Eplerenone 50mg .  Both medications are in the same class, but Eplerenone is reserve for patients that develop intolerance with spironolactone like breast tenderness.  Send Rx for only 30 days if patient agreeable to medication change. Need to schedule follow up with APP or PharmD for July. Encouraged to schedule today, our next available is July/18.

## 2020-07-28 NOTE — Telephone Encounter (Signed)
Patient calls back. Shared the information with her. She will contact the cardiologist that is prescribing this and inquire. She expresses her appreciation for the information.

## 2020-07-28 NOTE — Telephone Encounter (Signed)
Pt c/o medication issue:  1. Name of Medication: spironolactone (ALDACTONE) 25 MG tablet  2. How are you currently taking this medication (dosage and times per day)? 2 tablets by mouth daily   3. Are you having a reaction (difficulty breathing--STAT)? no  4. What is your medication issue? This patient is upset and stating that she is no longer going to take this medication because of the side effects from the medication. Patient takes that her breasts are getting enlarge from taking that medication. She said its been on going but never put the two together until her PCP told there the side effects of the medication. Patient wants Dr. Claiborne Billings to tell her what other medication she can take that's not going to give her that side effect.

## 2020-07-28 NOTE — Telephone Encounter (Signed)
RN called patient. Information given per Pharmacist. Patient is in agreement to start  new medication Eplerenone 50 mg daily. Appointment schedule for July 19 , at 2 pm - [facility] for follow up  Patient verbalized understanding.

## 2020-07-28 NOTE — Telephone Encounter (Signed)
Spoke to  patient. She states she has noticed her breast tissue has become  progressively large  for last  4-5 years. She states probably large cup size than before . She states she mentioned to all her doctors no mention to her until  GI doctor's nurse mention - that  Spironolactone maybe the causes.   Patient states she wil lnot be taking medication anymore.   She would like another medication for blood pressure. She states her body is reacting badly to all types of medication .  She is not checking blood pressure at home. The last reading at doctor office was 138/78.  Patient had an appointment with [facility] to discuss blood pressure but had to cancel. She states if Doctor would like for her to revisit. She will not be able to see [facility] until next month in July due to cost factor not having co- pay.  Patient  aware will defer to Dr Claiborne Billings and pharmacist

## 2020-08-03 ENCOUNTER — Ambulatory Visit: Payer: Medicare HMO

## 2020-08-05 ENCOUNTER — Ambulatory Visit: Payer: Medicare HMO | Admitting: General Practice

## 2020-08-20 ENCOUNTER — Ambulatory Visit: Payer: Medicare HMO | Admitting: Family Medicine

## 2020-08-31 ENCOUNTER — Ambulatory Visit: Payer: Medicare HMO

## 2020-09-01 ENCOUNTER — Ambulatory Visit (INDEPENDENT_AMBULATORY_CARE_PROVIDER_SITE_OTHER): Payer: Medicare HMO | Admitting: Family Medicine

## 2020-09-01 ENCOUNTER — Telehealth: Payer: Self-pay | Admitting: Cardiovascular Disease

## 2020-09-01 DIAGNOSIS — R399 Unspecified symptoms and signs involving the genitourinary system: Secondary | ICD-10-CM

## 2020-09-01 DIAGNOSIS — I2510 Atherosclerotic heart disease of native coronary artery without angina pectoris: Secondary | ICD-10-CM

## 2020-09-01 DIAGNOSIS — R300 Dysuria: Secondary | ICD-10-CM | POA: Diagnosis not present

## 2020-09-01 DIAGNOSIS — I10 Essential (primary) hypertension: Secondary | ICD-10-CM | POA: Diagnosis not present

## 2020-09-01 NOTE — Telephone Encounter (Signed)
Pt c/o medication issue:  1. Name of Medication:  eplerenone (INSPRA) 50 MG tablet  2. How are you currently taking this medication (dosage and times per day)? Stopped taking it as of yesterday   3. Are you having a reaction (difficulty breathing--STAT)? Yes   4. What is your medication issue? Believe's medication is causing no strength, swelling, memory loss as if she has dementia to the point of forgetting where her car is, & breast still swelling the way they were with spironolactone. Has stopped taking the medication wants to know if she can start back on spironolactone due to this. Please advise.

## 2020-09-01 NOTE — Telephone Encounter (Signed)
Pt calling today with c/o fatigue and confusion. She states this has been going on a few days now. She has some pedal swelling as well, however today she has stayed off her feet and has not had any swelling. Her today her SBP 150/?Marland Kitchen She denies weight gain. She continues to have breast swelling. She is wondering if these symptoms are from the eplerenone and if she should discontinue it and go back on the spironolactone. She denies recent fever, however has had a kidney infection a month or so ago. She denies any numbness, tingling, headache, loss of speech or motor function.   I advised her to call her PCP to follow up on her kidney infection and to identify another source of possible infection which can cause her confusion. In the meantime, I will forward to Dr. Claiborne Billings for review and additional recommendation.

## 2020-09-01 NOTE — Progress Notes (Signed)
Subjective:    Patient ID: [name] [name], female    DOB: 02/19/1950, 70 y.o.   MRN: [id number]   HPI: [name] [name] is a 70 y.o. female presenting for tiredness. So tired she can't function. A few months ago similar sx were a UTI. Notes an odor in her urine. Very strong some days. Onset was about a week. No burning. No Frequency. Not drinking as much as usual. Feels heavy in the chest. Not hurting. Substernal. Breasts swelling since starting Inspira. Pulse dropped to 48. Has had Bypass in 2013.No radiation. Pt. Is a Pharmacist, hospital for [employer]. Does not feel her signs are consistent with MI. Concerned that change of BP med has caused her sx as side effects. Wants to check in with cardiollogy, Dr. Claiborne Billings for that.   Depression screen [facility] 2/9 07/02/2020 03/10/2020 01/01/2018 11/27/2017 11/15/2017  Decreased Interest 0 0 0 0 0  Down, Depressed, Hopeless 0 0 0 0 0  PHQ - 2 Score 0 0 0 0 0  Altered sleeping - 0 0 - -  Tired, decreased energy - 0 0 - -  Change in appetite - 0 0 - -  Feeling bad or failure about yourself  - 0 0 - -  Trouble concentrating - 0 0 - -  Moving slowly or fidgety/restless - 0 0 - -  Suicidal thoughts - 0 0 - -  PHQ-9 Score - 0 0 - -  Difficult doing work/chores - - Not difficult at all - -  Some recent data might be hidden     Relevant past medical, surgical, family and social history reviewed and updated as indicated.  Interim medical history since our last visit reviewed. Allergies and medications reviewed and updated.  ROS:  Review of Systems  Constitutional:  Positive for fatigue.  Respiratory:  Negative for shortness of breath.   Cardiovascular:  Positive for chest pain.  Gastrointestinal:  Negative for abdominal pain.  Genitourinary:  Negative for difficulty urinating, dysuria (Strong odor noted), flank pain and frequency.  Neurological:  Positive for weakness.    Social History   Tobacco Use  Smoking Status Former   Packs/day: 0.50   Years: 15.00   Pack years:  7.50   Types: Cigarettes   Quit date: 05/23/2006   Years since quitting: 14.2  Smokeless Tobacco Never       Objective:     Wt Readings from Last 3 Encounters:  07/26/20 166 lb 9.6 oz (75.6 kg)  07/02/20 163 lb 3.2 oz (74 kg)  03/10/20 170 lb (77.1 kg)     Exam deferred. Pt. Harboring due to COVID 19. Phone visit performed.   Assessment & Plan:   1. Urinary symptom or sign   2. Coronary artery disease involving native heart without angina pectoris, unspecified vessel or lesion type   3. Essential hypertension     No orders of the defined types were placed in this encounter.   No orders of the defined types were placed in this encounter.     Diagnoses and all orders for this visit:  Urinary symptom or sign  Coronary artery disease involving native heart without angina pectoris, unspecified vessel or lesion type  Essential hypertension   Virtual Visit via telephone Note  I discussed the limitations, risks, security and privacy concerns of performing an evaluation and management service by telephone and the availability of in person appointments. The patient was identified with two identifiers. Pt.expressed understanding and agreed to proceed. Pt. Is at  home. Dr. Livia Snellen is in his office.  Follow Up Instructions:   I discussed the assessment and treatment plan with the patient. The patient was provided an opportunity to ask questions and all were answered. The patient agreed with the plan and demonstrated an understanding of the instructions.   The patient was advised to call back or seek an in-person evaluation if the symptoms worsen or if the condition fails to improve as anticipated.   Total minutes including chart review and phone contact time: 14   Follow up plan: No follow-ups on file.  Claretta Fraise, MD [place]

## 2020-09-02 ENCOUNTER — Other Ambulatory Visit: Payer: Self-pay | Admitting: Family Medicine

## 2020-09-02 ENCOUNTER — Ambulatory Visit: Payer: Medicare HMO | Admitting: *Deleted

## 2020-09-02 ENCOUNTER — Encounter: Payer: Self-pay | Admitting: Family Medicine

## 2020-09-02 ENCOUNTER — Telehealth: Payer: Self-pay | Admitting: Family Medicine

## 2020-09-02 ENCOUNTER — Other Ambulatory Visit: Payer: Medicare HMO

## 2020-09-02 ENCOUNTER — Other Ambulatory Visit: Payer: Self-pay

## 2020-09-02 DIAGNOSIS — I10 Essential (primary) hypertension: Secondary | ICD-10-CM

## 2020-09-02 DIAGNOSIS — R399 Unspecified symptoms and signs involving the genitourinary system: Secondary | ICD-10-CM | POA: Diagnosis not present

## 2020-09-02 LAB — MICROSCOPIC EXAMINATION: Epithelial Cells (non renal): NONE SEEN /hpf (ref 0–10)

## 2020-09-02 LAB — URINALYSIS, COMPLETE
Bilirubin, UA: NEGATIVE
Glucose, UA: NEGATIVE
Ketones, UA: NEGATIVE
Nitrite, UA: POSITIVE — AB
Protein,UA: NEGATIVE
Specific Gravity, UA: 1.01 (ref 1.005–1.030)
Urobilinogen, Ur: 0.2 mg/dL (ref 0.2–1.0)
pH, UA: 5.5 (ref 5.0–7.5)

## 2020-09-02 MED ORDER — SULFAMETHOXAZOLE-TRIMETHOPRIM 800-160 MG PO TABS: 1.0000 | ORAL_TABLET | Freq: Two times a day (BID) | ORAL | 0 refills | Status: DC

## 2020-09-02 NOTE — Telephone Encounter (Signed)
Patient wants her urine results. They are in her MyChart and hadn't been signed off on yet. Needs ABX called [facility].

## 2020-09-02 NOTE — Addendum Note (Signed)
Addended by: Baldomero Lamy B on: 09/02/2020 10:12 AM   Modules accepted: Orders

## 2020-09-02 NOTE — Telephone Encounter (Signed)
Patient aware.

## 2020-09-02 NOTE — Progress Notes (Signed)
Pt not feeling well, fatigued. Came in today for labs for UTI.  Wanting BP checked Right arm 157/72 p 67 Left arm 129/69 p 63 After a few mins rest right arm rechecked Right arm 125/70 p 63  She was comforted with the BP readings before she left and will wait to see if the lab results show what is causing her fatigue and not feeling well.

## 2020-09-02 NOTE — Telephone Encounter (Signed)
Please let the patient know that I sent their prescription to their pharmacy. Thanks, WS 

## 2020-09-03 NOTE — Telephone Encounter (Signed)
Called to follow up with patient. She went to see her PCP and was positive for UTI. Pt appreciative for the call, no additional questions at this time.

## 2020-09-05 LAB — URINE CULTURE

## 2020-09-06 ENCOUNTER — Encounter: Payer: Self-pay | Admitting: Family Medicine

## 2020-09-14 ENCOUNTER — Other Ambulatory Visit: Payer: Self-pay

## 2020-09-14 ENCOUNTER — Ambulatory Visit: Payer: Medicare HMO | Admitting: Pharmacist

## 2020-09-14 VITALS — BP 142/70 | HR 68 | Resp 17 | Ht 64.0 in | Wt 167.4 lb

## 2020-09-14 DIAGNOSIS — I10 Essential (primary) hypertension: Secondary | ICD-10-CM | POA: Diagnosis not present

## 2020-09-14 MED ORDER — HYDROCHLOROTHIAZIDE 12.5 MG PO CAPS: 12.5000 mg | ORAL_CAPSULE | Freq: Every day | ORAL | 0 refills | Status: DC

## 2020-09-14 NOTE — Patient Instructions (Addendum)
It was nice meeting you today!  We would like to have your blood pressure less than 130/80  I recommend starting to check your readings at home.  The blood pressure cuffs we recommend are made by [employer].  We recommend purchasing the series Bronze or higher or the Series 3 or higher.  Try to reduce the amount of salt you are consuming.  Increase physical activity to at least 30 minutes a day at least 5 days a week  We will start a new medication called hydrochlorothiazide 12.'5mg'$ .  I would like you to take 1 capsule in the morning.  We will check your lab work in 1-2 weeks  Please call with any questions!  Karren Cobble, PharmD, BCACP, [place], [place] [id number], [place], [place] [address] Phone: [phone]; Fax: [phone] 09/14/2020 2:41 PM
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
## 2020-09-16 ENCOUNTER — Other Ambulatory Visit: Payer: Self-pay

## 2020-09-16 ENCOUNTER — Other Ambulatory Visit: Payer: Medicare HMO

## 2020-09-16 DIAGNOSIS — R300 Dysuria: Secondary | ICD-10-CM | POA: Diagnosis not present

## 2020-09-16 LAB — MICROSCOPIC EXAMINATION
Bacteria, UA: NONE SEEN
Epithelial Cells (non renal): NONE SEEN /hpf (ref 0–10)
RBC, Urine: NONE SEEN /hpf (ref 0–2)
Renal Epithel, UA: NONE SEEN /hpf
WBC, UA: NONE SEEN /hpf (ref 0–5)

## 2020-09-16 LAB — URINALYSIS, COMPLETE
Bilirubin, UA: NEGATIVE
Glucose, UA: NEGATIVE
Ketones, UA: NEGATIVE
Leukocytes,UA: NEGATIVE
Nitrite, UA: NEGATIVE
Protein,UA: NEGATIVE
Specific Gravity, UA: >1.03 — ABNORMAL HIGH (ref 1.005–1.030)
Urobilinogen, Ur: 0.2 mg/dL (ref 0.2–1.0)
pH, UA: 5 (ref 5.0–7.5)

## 2020-09-30 ENCOUNTER — Telehealth: Payer: Self-pay | Admitting: Pharmacist

## 2020-09-30 ENCOUNTER — Other Ambulatory Visit: Payer: Self-pay

## 2020-09-30 ENCOUNTER — Ambulatory Visit: Payer: Medicare HMO

## 2020-09-30 ENCOUNTER — Other Ambulatory Visit: Payer: Medicare HMO

## 2020-09-30 DIAGNOSIS — Z0130 Encounter for examination of blood pressure without abnormal findings: Secondary | ICD-10-CM

## 2020-09-30 DIAGNOSIS — I10 Essential (primary) hypertension: Secondary | ICD-10-CM | POA: Diagnosis not present

## 2020-09-30 NOTE — Telephone Encounter (Signed)
Patient called, had labs drawn at PCP office and wanted to let us know her pulse was 75.  She reports it has not been in the 65s.  She also has lost weight from diuresis.  Patient pleased with results from HCTZ so far

## 2020-09-30 NOTE — Progress Notes (Signed)
Patient here today for blood pressure check.  She saw her cardiologist on 09/14/20 and he started her on Hctz 12.5 mg daily.  Her blood pressure today was 136/75, pulse 75.  Patient is going to contact cardiologist and let him know readings also.

## 2020-10-01 ENCOUNTER — Telehealth: Payer: Self-pay | Admitting: Cardiovascular Disease

## 2020-10-01 LAB — BASIC METABOLIC PANEL WITH GFR
BUN/Creatinine Ratio: 14 (ref 12–28)
BUN: 13 mg/dL (ref 8–27)
CO2: 23 mmol/L (ref 20–29)
Calcium: 9.5 mg/dL (ref 8.7–10.3)
Chloride: 103 mmol/L (ref 96–106)
Creatinine, Ser: 0.92 mg/dL (ref 0.57–1.00)
Glucose: 114 mg/dL — ABNORMAL HIGH (ref 65–99)
Potassium: 3.9 mmol/L (ref 3.5–5.2)
Sodium: 140 mmol/L (ref 134–144)
eGFR: 67 mL/min/1.73

## 2020-10-01 NOTE — Telephone Encounter (Signed)
Pt is calling in with concerns in regards to her medical condition

## 2020-10-01 NOTE — Telephone Encounter (Signed)
Called patient back, no answer.  LMOM.  Advised she can continue to hold HCTZ through the weekend if she believes it is making her feel poorly.  Advised she can communicate through myChart as well over the weekend

## 2020-10-01 NOTE — Telephone Encounter (Signed)
Spoke to patient. Patient states  she has been having spells of dizziness and nausea everyday. She states she has lost weight down 160 lbs.  She had an episode after lunch today dizziness and nausea. Patient states she did not take HCTZ today.  She is not taking Aspirin. Only taking Protonix and HCTZ up until today,   No issues with fever , diarrhea   Patient states she know it is the HCTZ - it gives her energy but she also is having the dizziness and nausea since last Saturday per patient.  She wants to wean off medication  She states most medication she has a reaction she states when she has side effects from medication she gets sinus drainage- patient states she is developing one now.   Patient aware  forward to [facility] pharmacist Gerald Stabs

## 2020-10-04 ENCOUNTER — Ambulatory Visit: Payer: Medicare HMO | Admitting: Family Medicine

## 2020-10-04 NOTE — Telephone Encounter (Signed)
See mychart message in regards to this.  Thanks!

## 2020-10-22 ENCOUNTER — Other Ambulatory Visit: Payer: Self-pay

## 2020-10-22 ENCOUNTER — Ambulatory Visit (INDEPENDENT_AMBULATORY_CARE_PROVIDER_SITE_OTHER): Payer: Medicare HMO | Admitting: Family Medicine

## 2020-10-22 ENCOUNTER — Encounter: Payer: Self-pay | Admitting: Family Medicine

## 2020-10-22 VITALS — BP 135/72 | HR 70 | Temp 98.3°F | Ht 64.0 in | Wt 166.8 lb

## 2020-10-22 DIAGNOSIS — R300 Dysuria: Secondary | ICD-10-CM

## 2020-10-22 DIAGNOSIS — F5101 Primary insomnia: Secondary | ICD-10-CM | POA: Diagnosis not present

## 2020-10-22 DIAGNOSIS — R5383 Other fatigue: Secondary | ICD-10-CM | POA: Diagnosis not present

## 2020-10-22 DIAGNOSIS — R69 Illness, unspecified: Secondary | ICD-10-CM | POA: Diagnosis not present

## 2020-10-22 DIAGNOSIS — R61 Generalized hyperhidrosis: Secondary | ICD-10-CM | POA: Diagnosis not present

## 2020-10-22 LAB — MICROSCOPIC EXAMINATION: RBC, Urine: NONE SEEN /hpf (ref 0–2)

## 2020-10-22 LAB — URINALYSIS, COMPLETE
Bilirubin, UA: NEGATIVE
Glucose, UA: NEGATIVE
Ketones, UA: NEGATIVE
Leukocytes,UA: NEGATIVE
Nitrite, UA: NEGATIVE
Protein,UA: NEGATIVE
RBC, UA: NEGATIVE
Specific Gravity, UA: >1.03 — ABNORMAL HIGH (ref 1.005–1.030)
Urobilinogen, Ur: 0.2 mg/dL (ref 0.2–1.0)
pH, UA: 5 (ref 5.0–7.5)

## 2020-10-22 MED ORDER — DOXEPIN HCL 3 MG PO TABS: 3.0000 mg | ORAL_TABLET | Freq: Every day | ORAL | 2 refills | Status: DC

## 2020-10-22 NOTE — Patient Instructions (Signed)
NO BENADRYL!  This causes urinary retention.  Urine did not show infection  Checking you for COVID 19    LOW dose doxepin sent for sleep.
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
## 2020-10-23 LAB — NOVEL CORONAVIRUS, NAA: SARS-CoV-2, NAA: NOT DETECTED

## 2020-10-23 LAB — SARS-COV-2, NAA 2 DAY TAT

## 2020-10-24 ENCOUNTER — Other Ambulatory Visit: Payer: Self-pay | Admitting: Cardiovascular Disease

## 2020-10-25 ENCOUNTER — Encounter: Payer: Self-pay | Admitting: Family Medicine

## 2020-10-26 ENCOUNTER — Ambulatory Visit: Payer: Medicare HMO | Admitting: Family Medicine

## 2020-10-26 LAB — URINE CULTURE

## 2020-11-02 ENCOUNTER — Ambulatory Visit
Admission: EM | Admit: 2020-11-02 | Discharge: 2020-11-02 | Disposition: A | Discharge status: Home / Self Care | Payer: Medicare HMO | Attending: Family Medicine | Admitting: Family Medicine

## 2020-11-02 ENCOUNTER — Encounter: Payer: Self-pay | Admitting: Emergency Medicine

## 2020-11-02 ENCOUNTER — Telehealth: Payer: Self-pay | Admitting: Pharmacist

## 2020-11-02 ENCOUNTER — Other Ambulatory Visit: Payer: Self-pay

## 2020-11-02 DIAGNOSIS — I10 Essential (primary) hypertension: Secondary | ICD-10-CM

## 2020-11-02 DIAGNOSIS — T887XXA Unspecified adverse effect of drug or medicament, initial encounter: Secondary | ICD-10-CM | POA: Diagnosis not present

## 2020-11-02 DIAGNOSIS — R5383 Other fatigue: Secondary | ICD-10-CM | POA: Diagnosis not present

## 2020-11-02 LAB — POCT URINALYSIS DIP (MANUAL ENTRY)
Bilirubin, UA: NEGATIVE
Blood, UA: NEGATIVE
Glucose, UA: NEGATIVE mg/dL
Ketones, POC UA: NEGATIVE mg/dL
Leukocytes, UA: NEGATIVE
Nitrite, UA: NEGATIVE
Protein Ur, POC: NEGATIVE mg/dL
Spec Grav, UA: 1.01 (ref 1.010–1.025)
Urobilinogen, UA: 0.2 E.U./dL
pH, UA: 7 (ref 5.0–8.0)

## 2020-11-02 NOTE — Telephone Encounter (Signed)
Patient feeling dizzy and nauseous which she thinks is due to HCTZ.  Discontinued and BP has increased to 150s.  Is going to urgent care.

## 2020-11-02 NOTE — ED Triage Notes (Addendum)
Started taking hctz medication on Saturday.  Since then she has been exhausted, feels like she is going to pass out.  States she took this med in the past and it did the same thing.  Her bp was high and she had some at the house that's why she took it.  Also has felt like this when she had a UTI in the past.

## 2020-11-02 NOTE — Discharge Instructions (Addendum)
You have been tested for COVID-19 today. °If your test returns positive, you will receive a phone call from [facility] regarding your results. °Negative test results are not called. °Both positive and negative results area always visible on MyChart. °If you do not have a MyChart account, sign up instructions are provided in your discharge papers. °Please do not hesitate to contact us should you have questions or concerns. ° °
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
## 2020-11-03 LAB — SARS-COV-2, NAA 2 DAY TAT

## 2020-11-03 LAB — NOVEL CORONAVIRUS, NAA: SARS-CoV-2, NAA: NOT DETECTED

## 2020-11-04 ENCOUNTER — Emergency Department (HOSPITAL_COMMUNITY)
Admission: EM | Admit: 2020-11-04 | Discharge: 2020-11-04 | Disposition: A | Discharge status: Home / Self Care | Payer: Medicare HMO | Attending: Emergency Medicine | Admitting: Emergency Medicine

## 2020-11-04 ENCOUNTER — Encounter: Payer: Self-pay | Admitting: Pharmacist

## 2020-11-04 ENCOUNTER — Emergency Department (HOSPITAL_COMMUNITY): Payer: Medicare HMO

## 2020-11-04 ENCOUNTER — Telehealth: Payer: Self-pay | Admitting: Cardiovascular Disease

## 2020-11-04 ENCOUNTER — Other Ambulatory Visit: Payer: Self-pay

## 2020-11-04 ENCOUNTER — Encounter (HOSPITAL_COMMUNITY): Payer: Self-pay | Admitting: *Deleted

## 2020-11-04 DIAGNOSIS — Z91040 Latex allergy status: Secondary | ICD-10-CM | POA: Insufficient documentation

## 2020-11-04 DIAGNOSIS — R0602 Shortness of breath: Secondary | ICD-10-CM | POA: Diagnosis not present

## 2020-11-04 DIAGNOSIS — I10 Essential (primary) hypertension: Secondary | ICD-10-CM | POA: Diagnosis not present

## 2020-11-04 DIAGNOSIS — R42 Dizziness and giddiness: Secondary | ICD-10-CM | POA: Diagnosis not present

## 2020-11-04 DIAGNOSIS — R5383 Other fatigue: Secondary | ICD-10-CM | POA: Diagnosis not present

## 2020-11-04 DIAGNOSIS — Z87891 Personal history of nicotine dependence: Secondary | ICD-10-CM | POA: Insufficient documentation

## 2020-11-04 DIAGNOSIS — Z7982 Long term (current) use of aspirin: Secondary | ICD-10-CM | POA: Diagnosis not present

## 2020-11-04 DIAGNOSIS — I119 Hypertensive heart disease without heart failure: Secondary | ICD-10-CM | POA: Insufficient documentation

## 2020-11-04 DIAGNOSIS — Z951 Presence of aortocoronary bypass graft: Secondary | ICD-10-CM | POA: Diagnosis not present

## 2020-11-04 DIAGNOSIS — I2510 Atherosclerotic heart disease of native coronary artery without angina pectoris: Secondary | ICD-10-CM | POA: Diagnosis not present

## 2020-11-04 DIAGNOSIS — R519 Headache, unspecified: Secondary | ICD-10-CM | POA: Diagnosis not present

## 2020-11-04 DIAGNOSIS — Z79899 Other long term (current) drug therapy: Secondary | ICD-10-CM | POA: Insufficient documentation

## 2020-11-04 DIAGNOSIS — Z20822 Contact with and (suspected) exposure to covid-19: Secondary | ICD-10-CM | POA: Insufficient documentation

## 2020-11-04 LAB — COMPREHENSIVE METABOLIC PANEL
ALT: 18 U/L (ref 0–44)
AST: 19 U/L (ref 15–41)
Albumin: 4.2 g/dL (ref 3.5–5.0)
Alkaline Phosphatase: 87 U/L (ref 38–126)
Anion gap: 7 (ref 5–15)
BUN: 20 mg/dL (ref 8–23)
CO2: 29 mmol/L (ref 22–32)
Calcium: 9.4 mg/dL (ref 8.9–10.3)
Chloride: 103 mmol/L (ref 98–111)
Creatinine, Ser: 0.76 mg/dL (ref 0.44–1.00)
GFR, Estimated: >60 mL/min (ref >60)
Glucose, Bld: 101 mg/dL — ABNORMAL HIGH (ref 70–99)
Potassium: 4.5 mmol/L (ref 3.5–5.1)
Sodium: 139 mmol/L (ref 135–145)
Total Bilirubin: 0.2 mg/dL — ABNORMAL LOW (ref 0.3–1.2)
Total Protein: 7 g/dL (ref 6.5–8.1)

## 2020-11-04 LAB — CBC WITH DIFFERENTIAL/PLATELET
Abs Immature Granulocytes: 0.03 10*3/uL (ref 0.00–0.07)
Basophils Absolute: 0.1 10*3/uL (ref 0.0–0.1)
Basophils Relative: 1 %
Eosinophils Absolute: 0.3 10*3/uL (ref 0.0–0.5)
Eosinophils Relative: 3 %
HCT: 44.8 % (ref 36.0–46.0)
Hemoglobin: 14.5 g/dL (ref 12.0–15.0)
Immature Granulocytes: 0 %
Lymphocytes Relative: 31 %
Lymphs Abs: 3.5 10*3/uL (ref 0.7–4.0)
MCH: 28.4 pg (ref 26.0–34.0)
MCHC: 32.4 g/dL (ref 30.0–36.0)
MCV: 87.8 fL (ref 80.0–100.0)
Monocytes Absolute: 0.9 10*3/uL (ref 0.1–1.0)
Monocytes Relative: 8 %
Neutro Abs: 6.3 10*3/uL (ref 1.7–7.7)
Neutrophils Relative %: 57 %
Platelets: 398 10*3/uL (ref 150–400)
RBC: 5.1 MIL/uL (ref 3.87–5.11)
RDW: 13.6 % (ref 11.5–15.5)
WBC: 11 10*3/uL — ABNORMAL HIGH (ref 4.0–10.5)
nRBC: 0 % (ref 0.0–0.2)

## 2020-11-04 LAB — RESP PANEL BY RT-PCR (FLU A&B, COVID) ARPGX2
Influenza A by PCR: NEGATIVE
Influenza B by PCR: NEGATIVE
SARS Coronavirus 2 by RT PCR: NEGATIVE

## 2020-11-04 LAB — URINALYSIS, ROUTINE W REFLEX MICROSCOPIC
Bilirubin Urine: NEGATIVE
Glucose, UA: NEGATIVE mg/dL
Hgb urine dipstick: NEGATIVE
Ketones, ur: NEGATIVE mg/dL
Leukocytes,Ua: NEGATIVE
Nitrite: NEGATIVE
Protein, ur: NEGATIVE mg/dL
Specific Gravity, Urine: 1.014 (ref 1.005–1.030)
pH: 7 (ref 5.0–8.0)

## 2020-11-04 LAB — CBG MONITORING, ED: Glucose-Capillary: 92 mg/dL (ref 70–99)

## 2020-11-04 MED ORDER — SPIRONOLACTONE 25 MG PO TABS
25.0000 mg | ORAL_TABLET | Freq: Every day | ORAL | 0 refills | Status: DC
Start: 2020-11-04 — End: 2020-11-29

## 2020-11-04 MED ORDER — ACETAMINOPHEN 500 MG PO TABS
1000.0000 mg | ORAL_TABLET | Freq: Once | ORAL | Status: AC
  Administered 2020-11-04: 1000 mg via ORAL
  Filled 2020-11-04: qty 2

## 2020-11-04 NOTE — Telephone Encounter (Signed)
Patient sent over message requesting to start spironolactone.  Per allergy list, had incidence of breast tenderness on spironolactone.  Called patient and left message on machine.  Considering trying eplerenone instead.

## 2020-11-04 NOTE — Telephone Encounter (Signed)
This encounter was created in error - please disregard.

## 2020-11-04 NOTE — Discharge Instructions (Addendum)
Please follow up with your PCP for further management and evaluation of your blood pressure and fatigue. Return to the ED for any new or worsening symptoms.

## 2020-11-04 NOTE — Telephone Encounter (Signed)
Pt c/o medication issue:  1. Name of Medication: spironolactone  2. How are you currently taking this medication (dosage and times per day)? N/a  3. Are you having a reaction (difficulty breathing--STAT)? no  4. What is your medication issue? Patient called in to say that no one has respond to her mychart messages. She would like the dr to respond, so that she can know what she needs to do bout the medication.

## 2020-11-04 NOTE — ED Triage Notes (Signed)
Fatigue and shortness of breath onset 3 weeks ago

## 2020-11-04 NOTE — Telephone Encounter (Signed)
Spoke to patient she stated she has been extremely tired for the past 3 weeks.Has been treated for a UTI.She went to urgent care this past Tuesday 9/20 urine clear no UTI.She had to stop HCTZ due to making her feel so bad.B/P has been ranging 159/76,152/82,139/85,152/80,153/82 at present 143/81.OIPPG-98,42,10,31,28.She is achy all over.Slight chest discomfort off and on.No sob No chest discomfort at present.Appointment scheduled with Coletta Memos 9/26 at 9:15 am.Stated she will go to ED if she does not start feeling better.
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
## 2020-11-08 ENCOUNTER — Ambulatory Visit: Payer: Medicare HMO | Admitting: General Practice

## 2020-11-08 ENCOUNTER — Other Ambulatory Visit: Payer: Self-pay

## 2020-11-08 ENCOUNTER — Encounter: Payer: Self-pay | Admitting: General Practice

## 2020-11-08 VITALS — BP 118/68 | HR 57 | Ht 64.5 in | Wt 166.4 lb

## 2020-11-08 DIAGNOSIS — I2510 Atherosclerotic heart disease of native coronary artery without angina pectoris: Secondary | ICD-10-CM

## 2020-11-08 DIAGNOSIS — Z79899 Other long term (current) drug therapy: Secondary | ICD-10-CM

## 2020-11-08 DIAGNOSIS — G4733 Obstructive sleep apnea (adult) (pediatric): Secondary | ICD-10-CM

## 2020-11-08 DIAGNOSIS — I10 Essential (primary) hypertension: Secondary | ICD-10-CM | POA: Diagnosis not present

## 2020-11-08 DIAGNOSIS — K219 Gastro-esophageal reflux disease without esophagitis: Secondary | ICD-10-CM

## 2020-11-08 DIAGNOSIS — E785 Hyperlipidemia, unspecified: Secondary | ICD-10-CM | POA: Diagnosis not present

## 2020-11-08 LAB — BASIC METABOLIC PANEL
BUN/Creatinine Ratio: 20 (ref 12–28)
BUN: 14 mg/dL (ref 8–27)
CO2: 22 mmol/L (ref 20–29)
Calcium: 9.6 mg/dL (ref 8.7–10.3)
Chloride: 102 mmol/L (ref 96–106)
Creatinine, Ser: 0.69 mg/dL (ref 0.57–1.00)
Glucose: 81 mg/dL (ref 70–99)
Potassium: 4.8 mmol/L (ref 3.5–5.2)
Sodium: 140 mmol/L (ref 134–144)
eGFR: 94 mL/min/{1.73_m2} (ref >59)

## 2020-11-08 NOTE — Patient Instructions (Signed)
Medication Instructions:  The current medical regimen is effective;  continue present plan and medications as directed. Please refer to the Current Medication list given to you today.   *If you need a refill on your cardiac medications before your next appointment, please call your pharmacy*  Lab Work: BMET TODAY If you have labs (blood work) drawn today and your tests are completely normal, you will receive your results only by:  [place] (if you have MyChart) OR A paper copy in the mail.  If you have any lab test that is abnormal or we need to change your treatment, we will call you to review the results. You may go to any [facility] that is convenient for you however, we do have a lab in our office that is able to assist you. You DO NOT need an appointment for our lab. The lab is open 8:00am and closes at 4:00pm. Lunch 12:45 - 1:45pm.  Special Instructions STAY AWAY FROM POTASSIUM FOOD-SEE ATTACHED  PLEASE READ AND FOLLOW SALTY 6-ATTACHED-1,800 mg daily  TAKE AND LOG YOUR BLOOD PRESSURE-TAKE YOUR BLOOD PRESSURE AT LEAST 1 HOUR AFTER TAKING YOUR MEDICATION  Follow-Up: Your next appointment:  KEEP SCHEDULED  APPOINTMENT AND ECHO  In Person with Shelva Majestic, MD   At [facility], you and your health needs are our priority.  As part of our continuing mission to provide you with exceptional heart care, we have created designated Provider Care Teams.  These Care Teams include your primary Cardiologist (physician) and Advanced Practice Providers (APPs -  Physician Assistants and Nurse Practitioners) who all work together to provide you with the care you need, when you need it.         Potassium Content of Foods Potassium is a mineral found in many foods and drinks. It affects how the heart works, and helps keep fluids and minerals balanced in the body. The amount of potassium you need each day depends on your age and any medical conditions you may have. Talk to your health care  provider or dietitian about how much potassium you need. The following lists of foods provide the general serving size for foods and the approximate amount of potassium in each serving, listed in milligrams (mg). Actual values may vary depending on the product and how it is processed. High in potassium The following foods and beverages have 200 mg or more of potassium per serving: Apricots (raw) - 2 have 200 mg of potassium. Apricots (dry) - 5 have 200 mg of potassium. Artichoke - 1 medium has 345 mg of potassium. Avocado -  fruit has 245 mg of potassium. Banana - 1 medium fruit has 425 mg of potassium. [place] or baked beans (canned) -  cup has 280 mg of potassium. White beans (canned) -  cup has 595 mg potassium. Beef roast - 3 oz has 320 mg of potassium. Ground beef - 3 oz has 270 mg of potassium. Beets (raw or cooked) -  cup has 260 mg of potassium. Bran muffin - 2 oz has 300 mg of potassium. Broccoli (cooked) -  cup has 230 mg of potassium. Brussels sprouts -  cup has 250 mg of potassium. Cantaloupe -  cup has 215 mg of potassium. Cereal, 100% bran -  cup has 200-400 mg of potassium. Cheeseburger -1 single fast food burger has 225-400 mg of potassium. Chicken - 3 oz has 220 mg of potassium. Clams (canned) - 3 oz has 535 mg of potassium. Crab - 3 oz has 225  mg of potassium. Dates - 5 have 270 mg of potassium. Dried beans and peas -  cup has 300-475 mg of potassium. Figs (dried) - 2 have 260 mg of potassium. Fish (halibut, tuna, cod, snapper) - 3 oz has 480 mg of potassium. Fish (salmon, haddock, swordfish, perch) - 3 oz has 300 mg of potassium. Fish (tuna, canned) - 3 oz has 200 mg of potassium. [place] fries (fast food) - 3 oz has 470 mg of potassium. Granola with fruit and nuts -  cup has 200 mg of potassium. Grapefruit juice -  cup has 200 mg of potassium. Honeydew melon -  cup has 200 mg of potassium. Kale (raw) - 1 cup has 300 mg of potassium. Kiwi - 1 medium fruit  has 240 mg of potassium. Kohlrabi, rutabaga, parsnips -  cup has 280 mg of potassium. Lentils -  cup has 365 mg of potassium. Mango - 1 each has 325 mg of potassium. Milk (nonfat, low-fat, whole, buttermilk) - 1 cup has 350-380 mg of potassium. Milk (chocolate) - 1 cup has 420 mg of potassium Molasses - 1 Tbsp has 295 mg of potassium. Mushrooms -  cup has 280 mg of potassium. Nectarine - 1 each has 275 mg of potassium. Nuts (almonds, peanuts, hazelnuts, [place], cashew, mixed) - 1 oz has 200 mg of potassium. Nuts (pistachios) - 1 oz has 295 mg of potassium. Orange - 1 fruit has 240 mg of potassium. Orange juice -  cup has 235 mg of potassium. Papaya -  medium fruit has 390 mg of potassium. Peanut butter (chunky) - 2 Tbsp has 240 mg of potassium. Peanut butter (smooth) - 2 Tbsp has 210 mg of potassium. Pear - 1 medium (200 mg) of potassium. Pomegranate - 1 whole fruit has 400 mg of potassium. Pomegranate juice -  cup has 215 mg of potassium. Pork - 3 oz has 350 mg of potassium. Potato chips (salted) - 1 oz has 465 mg of potassium. Potato (baked with skin) - 1 medium has 925 mg of potassium. Potato (boiled) -  cup has 255 mg of potassium. Potato (Mashed) -  cup has 330 mg of potassium. Prune juice -  cup has 370 mg of potassium. Prunes - 5 have 305 mg of potassium. Pudding (chocolate) -  cup has 230 mg of potassium. Pumpkin (canned) -  cup has 250 mg of potassium. Raisins (seedless) -  cup has 270 mg of potassium. Seeds (sunflower or pumpkin) - 1 oz has 240 mg of potassium. Soy milk - 1 cup has 300 mg of potassium. Spinach (cooked) - 1/2 cup has 420 mg of potassium. Spinach (canned) -  cup has 370 mg of potassium. Sweet potato (baked with skin) - 1 medium has 450 mg of potassium. Swiss chard -  cup has 480 mg of potassium. Tomato or vegetable juice -  cup has 275 mg of potassium. Tomato (sauce or puree) -  cup has 400-550 mg of potassium. Tomato (raw) - 1 medium has  290 mg of potassium. Tomato (canned) -  cup has 200-300 mg of potassium. [place] - 3 oz has 250 mg of potassium. Wheat germ - 1 oz has 250 mg of potassium. Winter squash -  cup has 250 mg of potassium. Yogurt (plain or fruited) - 6 oz has 260-435 mg of potassium. Zucchini -  cup has 220 mg of potassium. Moderate in potassium The following foods and beverages have 50-200 mg of potassium per serving: Apple - 1 fruit has 150 mg  of potassium Apple juice -  cup has 150 mg of potassium Applesauce -  cup has 90 mg of potassium Apricot nectar -  cup has 140 mg of potassium Asparagus (small spears) -  cup has 155 mg of potassium Asparagus (large spears) - 6 have 155 mg of potassium Bagel (cinnamon raisin) - 1 four-inch bagel has 130 mg of potassium Bagel (egg or plain) - 1 four- inch bagel has 70 mg of potassium Beans (green) -  cup has 90 mg of potassium Beans (yellow) -  cup has 190 mg of potassium Beer, regular - 12 oz has 100 mg of potassium Beets (canned) -  cup has 125 mg of potassium Blackberries -  cup has 115 mg of potassium Blueberries -  cup has 60 mg of potassium Bread (whole wheat) - 1 slice has 70 mg of potassium Broccoli (raw) -  cup has 145 mg of potassium Cabbage -  cup has 150 mg of potassium Carrots (cooked or raw) -  cup has 180 mg of potassium Cauliflower (raw) -  cup has 150 mg of potassium Celery (raw) -  cup has 155 mg of potassium Cereal, bran flakes -  cup has 120-150 mg of potassium Cheese (cottage) -  cup has 110 mg of potassium Cherries - 10 have 150 mg of potassium Chocolate - 1 oz bar has 165 mg of potassium Coffee (brewed) - 6 oz has 90 mg of potassium Corn -  cup or 1 ear has 195 mg of potassium Cucumbers -  cup has 80 mg of potassium Egg - 1 large egg has 60 mg of potassium Eggplant -  cup has 60 mg of potassium Endive (raw) -  cup has 80 mg of potassium English muffin - 1 has 65 mg of potassium Fish (ocean perch) - 3 oz has 192  mg of potassium Frankfurter, beef or pork - 1 has 75 mg of potassium Fruit cocktail -  cup has 115 mg of potassium Grape juice -  cup has 170 mg of potassium Grapefruit -  fruit has 175 mg of potassium Grapes -  cup has 155 mg of potassium Greens: kale, turnip, collard -  cup has 110-150 mg of potassium Ice cream or frozen yogurt (chocolate) -  cup has 175 mg of potassium Ice cream or frozen yogurt (vanilla) -  cup has 120-150 mg of potassium Lemons, limes - 1 each has 80 mg of potassium Lettuce - 1 cup has 100 mg of potassium Mixed vegetables -  cup has 150 mg of potassium Mushrooms, raw -  cup has 110 mg of potassium Nuts (walnuts, pecans, or macadamia) - 1 oz has 125 mg of potassium Oatmeal -  cup has 80 mg of potassium Okra -  cup has 110 mg of potassium Onions -  cup has 120 mg of potassium Peach - 1 has 185 mg of potassium Peaches (canned) -  cup has 120 mg of potassium Pears (canned) -  cup has 120 mg of potassium Peas, green (frozen) -  cup has 90 mg of potassium Peppers (Green) -  cup has 130 mg of potassium Peppers (Red) -  cup has 160 mg of potassium Pineapple juice -  cup has 165 mg of potassium Pineapple (fresh or canned) -  cup has 100 mg of potassium Plums - 1 has 105 mg of potassium Pudding, vanilla -  cup has 150 mg of potassium Raspberries -  cup has 90 mg of potassium Rhubarb -  cup has 115  mg of potassium Rice, wild -  cup has 80 mg of potassium Shrimp - 3 oz has 155 mg of potassium Spinach (raw) - 1 cup has 170 mg of potassium Strawberries -  cup has 125 mg of potassium Summer squash -  cup has 175-200 mg of potassium Swiss chard (raw) - 1 cup has 135 mg of potassium Tangerines - 1 fruit has 140 mg of potassium Tea, brewed - 6 oz has 65 mg of potassium Turnips -  cup has 140 mg of potassium Watermelon -  cup has 85 mg of potassium Wine (Red, table) - 5 oz has 180 mg of potassium Wine (White, table) - 5 oz 100 mg of potassium Low  in potassium The following foods and beverages have less than 50 mg of potassium per serving. Bread (white) - 1 slice has 30 mg of potassium Carbonated beverages - 12 oz has less than 5 mg of potassium Cheese - 1 oz has 20-30 mg of potassium Cranberries -  cup has 45 mg of potassium Cranberry juice cocktail -  cup has 20 mg of potassium Fats and oils - 1 Tbsp has less than 5 mg of potassium Hummus - 1 Tbsp has 32 mg of potassium Nectar (papaya, mango, or pear) -  cup has 35 mg of potassium Rice (white or brown) -  cup has 50 mg of potassium Spaghetti or macaroni (cooked) -  cup has 30 mg of potassium Tortilla, flour or corn - 1 has 50 mg of potassium Waffle - 1 four-inch waffle has 50 mg of potassium Water chestnuts -  cup has 40 mg of potassium Summary Potassium is a mineral found in many foods and drinks. It affects how the heart works, and helps keep fluids and minerals balanced in the body. The amount of potassium you need each day depends on your age and any existing medical conditions you may have. Your health care provider or dietitian may recommend an amount of potassium that you should have each day. This information is not intended to replace advice given to you by your health care provider. Make sure you discuss any questions you have with your health care provider. Document Revised: 01/12/2017 Document Reviewed: 04/26/2016 Elsevier Patient Education  [employer] [facility].

## 2020-11-10 ENCOUNTER — Encounter: Payer: Self-pay | Admitting: Family Medicine

## 2020-11-10 ENCOUNTER — Telehealth: Payer: Self-pay | Admitting: Family Medicine

## 2020-11-10 NOTE — Telephone Encounter (Signed)
I messaged her this am, if she is still feeling poorly, I'd like her seen.  Yes, her WBCs were slightly elevated but she demonstrated no evidence of viral/ bacterial infection.  Her testing has been NEGATIVE so far.

## 2020-11-10 NOTE — Telephone Encounter (Signed)
This encounter was created in error - please disregard.

## 2020-11-10 NOTE — Telephone Encounter (Signed)
Patient reports she is still having fatigue, dizziness, and sporadic episodes of feeling like she may pass out.  She saw her cardiologist yesterday and had extensive lab work and is scheduled for an echo on 11/30/20.  She is concerned because she is still having these symptoms and her WBC's were slightly elevated when she went to ER on 11/04/20.  I offered her an appointment with a NP but she wanted me to ask if you would be willing to send in an antibiotic to [facility] to see if this would help her feel better.  She is having slight discomfort with urination.

## 2020-11-11 DIAGNOSIS — Z79899 Other long term (current) drug therapy: Secondary | ICD-10-CM

## 2020-11-11 DIAGNOSIS — I10 Essential (primary) hypertension: Secondary | ICD-10-CM

## 2020-11-12 MED ORDER — CHLORTHALIDONE 25 MG PO TABS: 12.5000 mg | ORAL_TABLET | Freq: Every day | ORAL | 6 refills | Status: DC

## 2020-11-12 NOTE — Telephone Encounter (Signed)
Pt informed of providers result & recommendations.  She will start today. She states that she does follow a low-sodium diet and will try to increase her physical activity.  We will start her on chlorthalidone 12.5 mg daily, continue her blood pressure log, New rx sent to [facility] as requested. She will return for non fasting lab in 1 week. Pt verbalized understanding. Future order entered.

## 2020-11-15 ENCOUNTER — Encounter: Payer: Self-pay | Admitting: Family Medicine

## 2020-11-16 ENCOUNTER — Ambulatory Visit (INDEPENDENT_AMBULATORY_CARE_PROVIDER_SITE_OTHER): Payer: Medicare HMO | Admitting: Family Medicine

## 2020-11-16 ENCOUNTER — Encounter: Payer: Self-pay | Admitting: Family Medicine

## 2020-11-16 ENCOUNTER — Other Ambulatory Visit: Payer: Self-pay

## 2020-11-16 VITALS — BP 156/83 | HR 61 | Temp 98.3°F | Ht 64.0 in | Wt 164.0 lb

## 2020-11-16 DIAGNOSIS — N3001 Acute cystitis with hematuria: Secondary | ICD-10-CM

## 2020-11-16 DIAGNOSIS — R5383 Other fatigue: Secondary | ICD-10-CM

## 2020-11-16 DIAGNOSIS — R300 Dysuria: Secondary | ICD-10-CM

## 2020-11-16 DIAGNOSIS — I10 Essential (primary) hypertension: Secondary | ICD-10-CM

## 2020-11-16 DIAGNOSIS — E559 Vitamin D deficiency, unspecified: Secondary | ICD-10-CM | POA: Insufficient documentation

## 2020-11-16 LAB — URINALYSIS
Bilirubin, UA: NEGATIVE
Glucose, UA: NEGATIVE
Nitrite, UA: POSITIVE — AB
Specific Gravity, UA: 1.025 (ref 1.005–1.030)
Urobilinogen, Ur: 0.2 mg/dL (ref 0.2–1.0)
pH, UA: 5.5 (ref 5.0–7.5)

## 2020-11-16 MED ORDER — AMOXICILLIN-POT CLAVULANATE 875-125 MG PO TABS: 1.0000 | ORAL_TABLET | Freq: Two times a day (BID) | ORAL | 0 refills | Status: AC
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
## 2020-11-17 LAB — BMP8+EGFR
BUN/Creatinine Ratio: 17 (ref 12–28)
BUN: 17 mg/dL (ref 8–27)
CO2: 21 mmol/L (ref 20–29)
Calcium: 9.6 mg/dL (ref 8.7–10.3)
Chloride: 102 mmol/L (ref 96–106)
Creatinine, Ser: 0.98 mg/dL (ref 0.57–1.00)
Glucose: 89 mg/dL (ref 70–99)
Potassium: 4.8 mmol/L (ref 3.5–5.2)
Sodium: 142 mmol/L (ref 134–144)
eGFR: 62 mL/min/{1.73_m2} (ref >59)

## 2020-11-17 LAB — CBC WITH DIFFERENTIAL/PLATELET
Basophils Absolute: 0.1 10*3/uL (ref 0.0–0.2)
Basos: 1 %
EOS (ABSOLUTE): 0.2 10*3/uL (ref 0.0–0.4)
Eos: 2 %
Hematocrit: 46.2 % (ref 34.0–46.6)
Hemoglobin: 15.6 g/dL (ref 11.1–15.9)
Immature Grans (Abs): 0.1 10*3/uL (ref 0.0–0.1)
Immature Granulocytes: 1 %
Lymphocytes Absolute: 3.1 10*3/uL (ref 0.7–3.1)
Lymphs: 28 %
MCH: 28.1 pg (ref 26.6–33.0)
MCHC: 33.8 g/dL (ref 31.5–35.7)
MCV: 83 fL (ref 79–97)
Monocytes Absolute: 0.8 10*3/uL (ref 0.1–0.9)
Monocytes: 7 %
Neutrophils Absolute: 6.8 10*3/uL (ref 1.4–7.0)
Neutrophils: 61 %
Platelets: 463 10*3/uL — ABNORMAL HIGH (ref 150–450)
RBC: 5.55 x10E6/uL — ABNORMAL HIGH (ref 3.77–5.28)
RDW: 13.3 % (ref 11.7–15.4)
WBC: 11 10*3/uL — ABNORMAL HIGH (ref 3.4–10.8)

## 2020-11-17 LAB — THYROID PANEL WITH TSH
Free Thyroxine Index: 2 (ref 1.2–4.9)
T3 Uptake Ratio: 23 % — ABNORMAL LOW (ref 24–39)
T4, Total: 8.5 ug/dL (ref 4.5–12.0)
TSH: 1.98 u[IU]/mL (ref 0.450–4.500)

## 2020-11-17 LAB — VITAMIN D 25 HYDROXY (VIT D DEFICIENCY, FRACTURES): Vit D, 25-Hydroxy: 38.6 ng/mL (ref 30.0–100.0)

## 2020-11-20 LAB — URINE CULTURE

## 2020-11-24 ENCOUNTER — Other Ambulatory Visit: Payer: Self-pay | Admitting: Family Medicine

## 2020-11-24 DIAGNOSIS — Z1231 Encounter for screening mammogram for malignant neoplasm of breast: Secondary | ICD-10-CM

## 2020-11-25 ENCOUNTER — Ambulatory Visit: Payer: Medicare HMO | Admitting: Family Medicine

## 2020-11-27 ENCOUNTER — Other Ambulatory Visit: Payer: Self-pay | Admitting: Cardiology

## 2020-11-30 ENCOUNTER — Ambulatory Visit (HOSPITAL_COMMUNITY): Payer: Medicare HMO | Attending: Cardiovascular Disease

## 2020-11-30 ENCOUNTER — Other Ambulatory Visit: Payer: Self-pay

## 2020-11-30 DIAGNOSIS — E785 Hyperlipidemia, unspecified: Secondary | ICD-10-CM | POA: Diagnosis not present

## 2020-11-30 DIAGNOSIS — Z951 Presence of aortocoronary bypass graft: Secondary | ICD-10-CM | POA: Diagnosis not present

## 2020-11-30 DIAGNOSIS — G4733 Obstructive sleep apnea (adult) (pediatric): Secondary | ICD-10-CM

## 2020-11-30 DIAGNOSIS — I10 Essential (primary) hypertension: Secondary | ICD-10-CM | POA: Diagnosis not present

## 2020-11-30 LAB — ECHOCARDIOGRAM COMPLETE
Area-P 1/2: 3.48 cm2
S' Lateral: 2.7 cm

## 2020-12-08 DIAGNOSIS — H16223 Keratoconjunctivitis sicca, not specified as Sjogren's, bilateral: Secondary | ICD-10-CM | POA: Diagnosis not present

## 2020-12-08 DIAGNOSIS — S0502XA Injury of conjunctiva and corneal abrasion without foreign body, left eye, initial encounter: Secondary | ICD-10-CM | POA: Diagnosis not present

## 2020-12-10 ENCOUNTER — Other Ambulatory Visit: Payer: Self-pay | Admitting: Cardiovascular Disease

## 2020-12-10 DIAGNOSIS — I10 Essential (primary) hypertension: Secondary | ICD-10-CM

## 2020-12-15 ENCOUNTER — Ambulatory Visit (INDEPENDENT_AMBULATORY_CARE_PROVIDER_SITE_OTHER): Payer: Medicare HMO | Admitting: Family Medicine

## 2020-12-15 ENCOUNTER — Other Ambulatory Visit: Payer: Self-pay

## 2020-12-15 VITALS — BP 143/85 | HR 71 | Temp 97.6°F | Ht 64.0 in | Wt 170.0 lb

## 2020-12-15 DIAGNOSIS — J302 Other seasonal allergic rhinitis: Secondary | ICD-10-CM

## 2020-12-15 DIAGNOSIS — Z87440 Personal history of urinary (tract) infections: Secondary | ICD-10-CM

## 2020-12-15 MED ORDER — AZELASTINE HCL 0.1 % NA SOLN: 1.0000 | Freq: Two times a day (BID) | NASAL | 12 refills | Status: DC
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
## 2020-12-19 IMAGING — MG DIGITAL SCREENING BILAT W/ TOMO
8 series · 8 of 24 positions shown · non-contrast
Comparison: Previous exam(s).

CLINICAL DATA: Screening.

EXAM:
DIGITAL SCREENING BILATERAL MAMMOGRAM WITH TOMO AND CAD

[R MLO synth-2D]
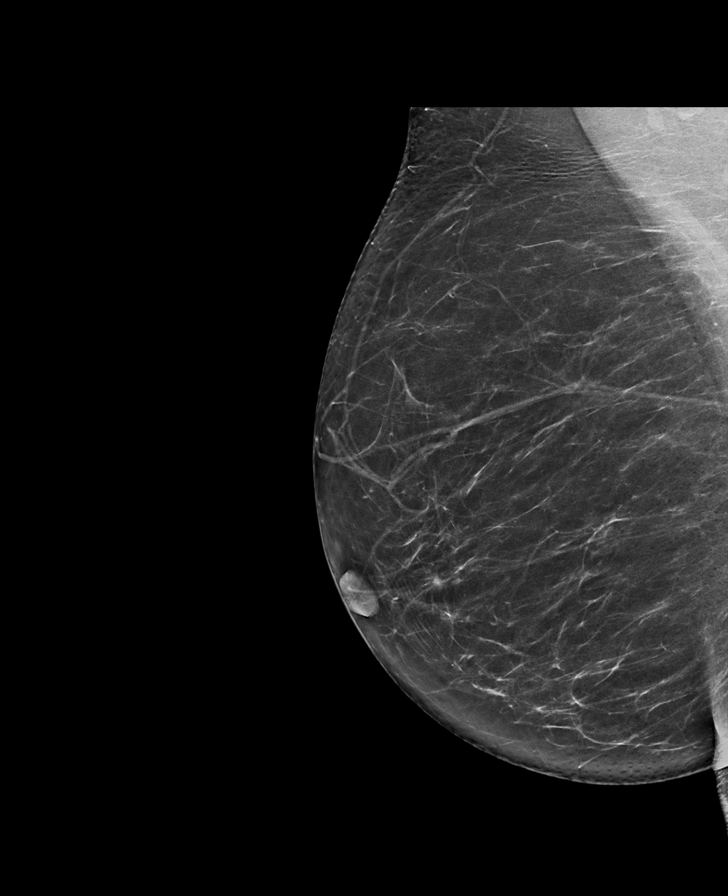

[L CC synth-2D]
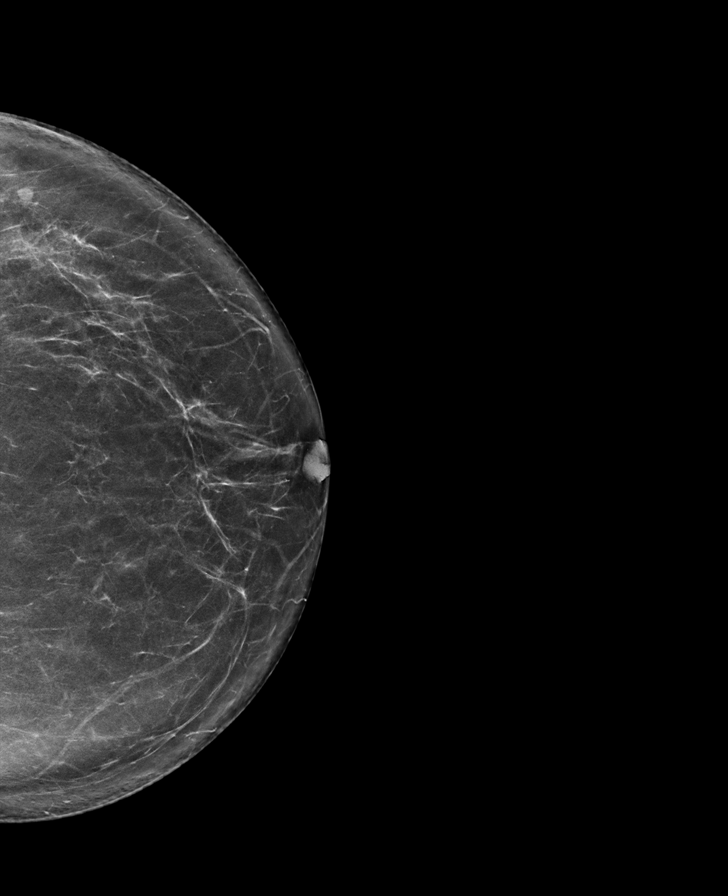

[L MLO synth-2D]
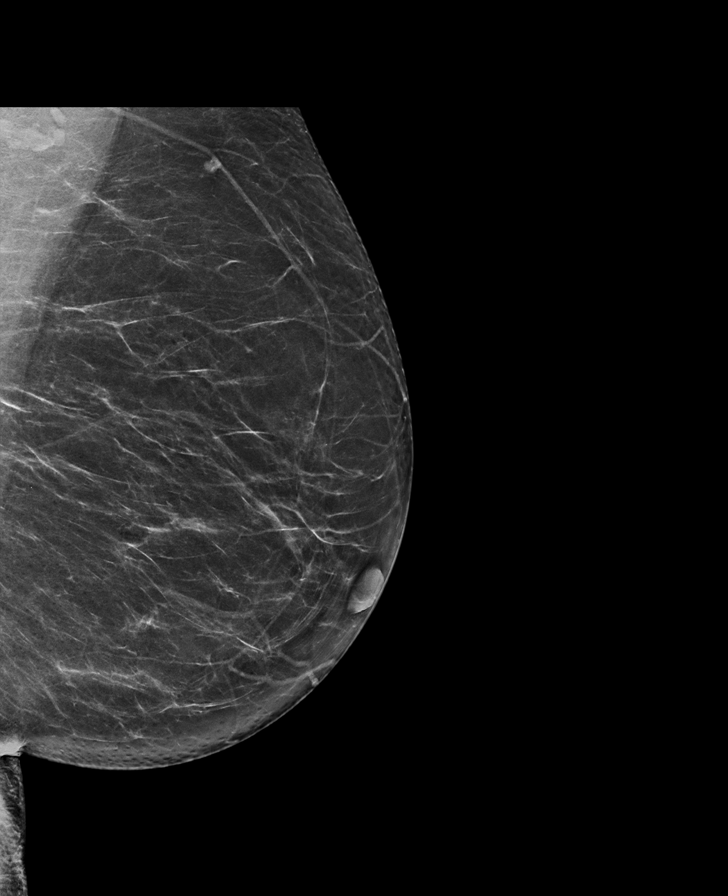

[R CC synth-2D]
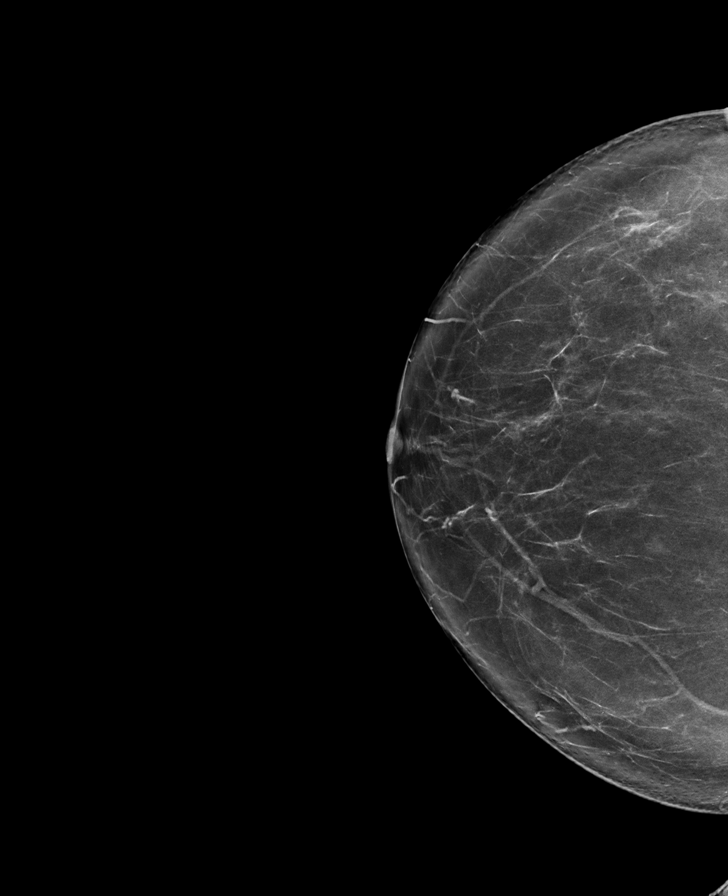

[L CC tomo · tomo slice 39/77.0]
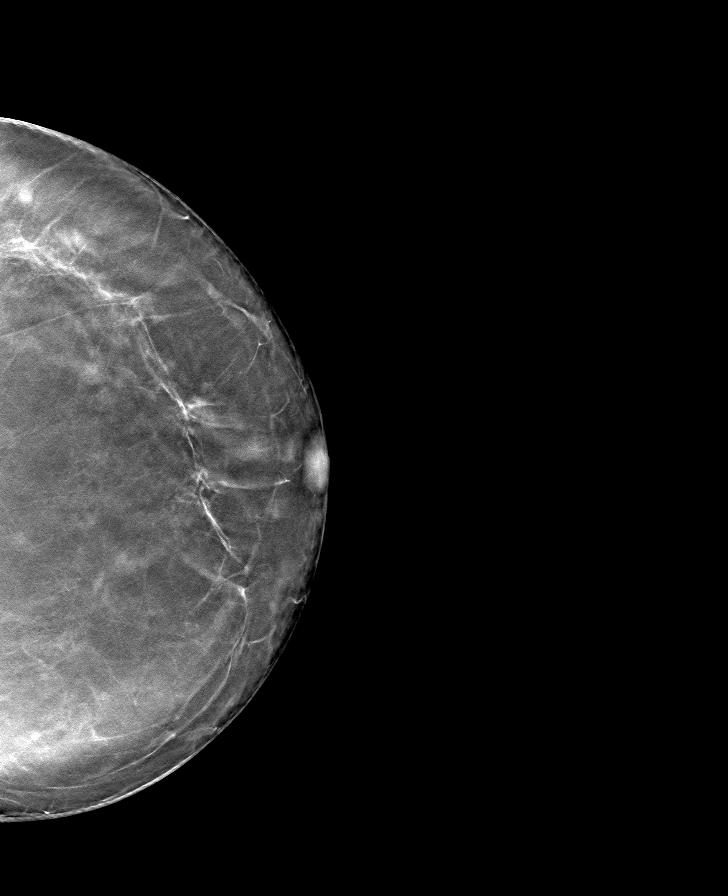

[R CC tomo · tomo slice 41/81.0]
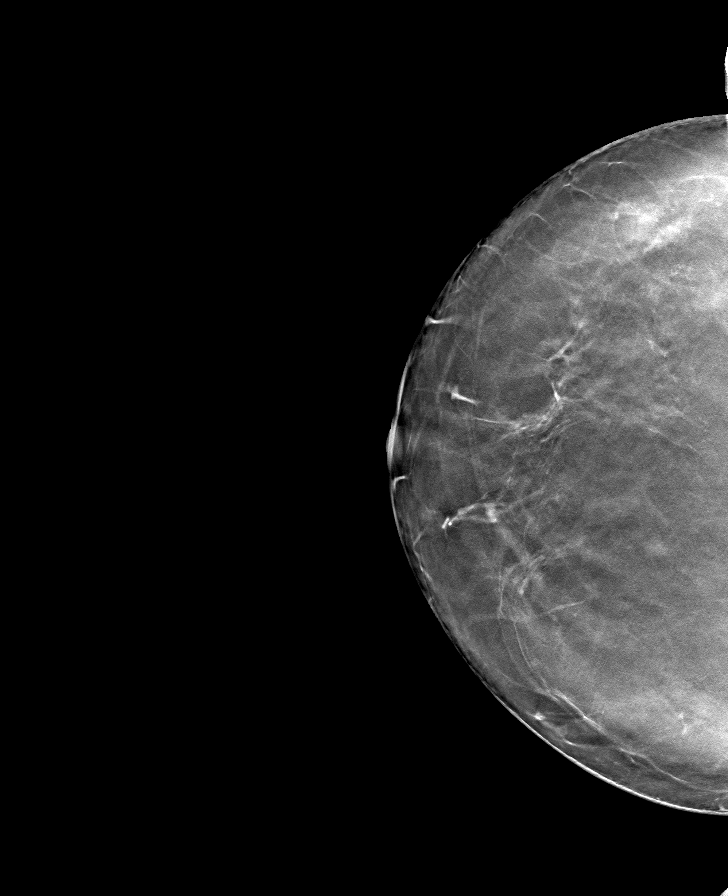

[R MLO tomo · tomo slice 40/79.0]
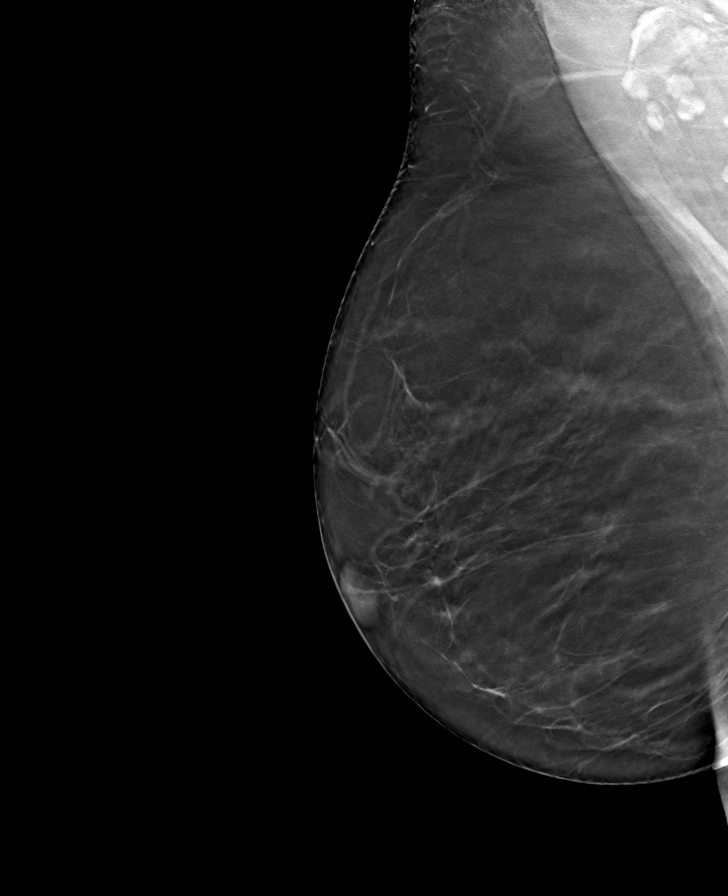

[L MLO tomo · tomo slice 39/76.0]
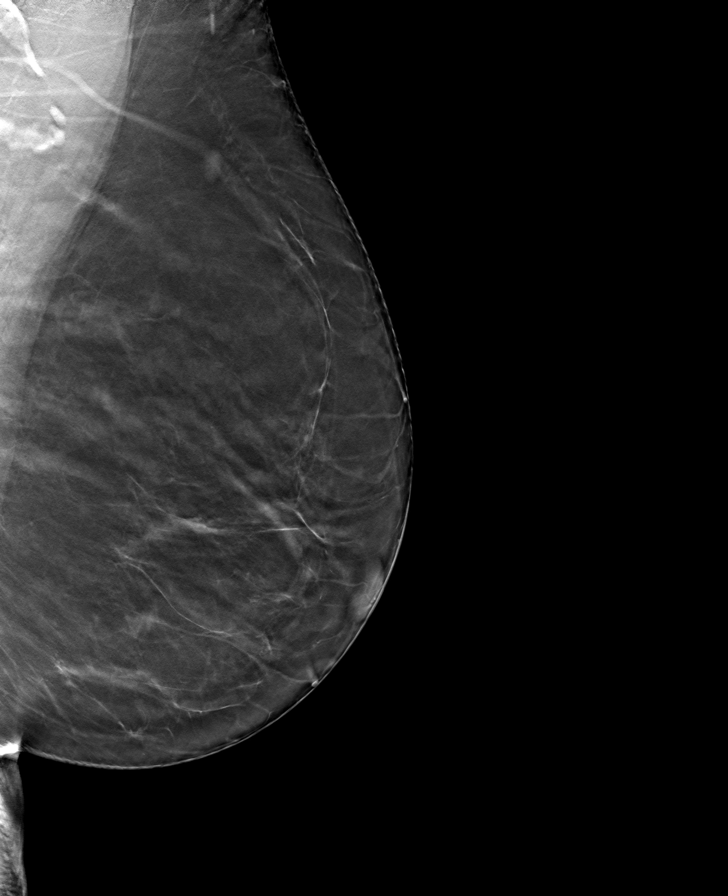

[8 of 24 positions shown; findings below may reference images not displayed]

ACR Breast Density Category b: There are scattered areas of
fibroglandular density.
FINDINGS: There are no findings suspicious for malignancy. Images were
processed with CAD.
IMPRESSION: No mammographic evidence of malignancy. A result letter of this
screening mammogram will be mailed directly to the patient.

RECOMMENDATION:
Screening mammogram in one year. (Code:CN-U-775)

BI-RADS CATEGORY  1: Negative.

## 2020-12-22 ENCOUNTER — Encounter: Payer: Self-pay | Admitting: Cardiovascular Disease

## 2020-12-22 ENCOUNTER — Ambulatory Visit: Payer: Medicare HMO | Admitting: Cardiovascular Disease

## 2020-12-22 ENCOUNTER — Other Ambulatory Visit: Payer: Self-pay

## 2020-12-22 VITALS — BP 138/76 | HR 56 | Ht 64.0 in | Wt 169.4 lb

## 2020-12-22 DIAGNOSIS — G4733 Obstructive sleep apnea (adult) (pediatric): Secondary | ICD-10-CM

## 2020-12-22 DIAGNOSIS — E785 Hyperlipidemia, unspecified: Secondary | ICD-10-CM

## 2020-12-22 DIAGNOSIS — I2510 Atherosclerotic heart disease of native coronary artery without angina pectoris: Secondary | ICD-10-CM

## 2020-12-22 DIAGNOSIS — K219 Gastro-esophageal reflux disease without esophagitis: Secondary | ICD-10-CM

## 2020-12-22 DIAGNOSIS — Z951 Presence of aortocoronary bypass graft: Secondary | ICD-10-CM

## 2020-12-22 DIAGNOSIS — I10 Essential (primary) hypertension: Secondary | ICD-10-CM

## 2020-12-22 MED ORDER — ATORVASTATIN CALCIUM 40 MG PO TABS: 40.0000 mg | ORAL_TABLET | Freq: Every day | ORAL | 3 refills | Status: DC

## 2020-12-22 NOTE — Progress Notes (Signed)
Patient ID: [name] [name], female   DOB: 09/20/1950, 70 y.o.   MRN: [id number]     HPI: [name] [name] is a 70 y.o. female who presents to the office today for a 4 month follow up cardiology evaluation.  Ms Bang has CAD and underwent cardiac catheterization on 05/22/2011 which revealed 70% distal left main stenosis, 60% ostial circumflex stenosis and mild RCA disease.  She underwent CABG surgery x2 by Dr. Nils Pyle and had a LIMA placed to her LAD, and vein to the OM 2 vessel.  Ejection fraction was 50-55%.  In October 2014, she  noticed some vague chest fullness and underwent a nuclear perfusion study which demonstrated hyperdynamic LV with an EF of 83% post stress.  She did not have ECG changes; she had normal perfusion and function.  When I saw her in October 2016  she has been without chest pain.  She has a history of vitamin D insufficiency in the past which had improved.  She has issues with muscle spasm for which and was taking Flexeril on an as-needed basis.  She has GERD for which he takes Protonix on an as-needed basis.  She has been maintained on Lipitor 10 mg for hyperlipidemia.  She had issues with diarrhea.  At that time had stopped her losartan.   She underwent an echo Doppler study in October 2016 which showed an EF of 65-70% with grade 1 diastolic dysfunction.  There was mild TR, and otherwise no significant valvular pathology.  She is unaware of any palpitations.  She denies PND, orthopnea.  She had blood work one month ago by her primary physician and her TSH was 1.5.  BUN and creatinine were 15 and 0.73, respectively.  LFTs were normal, total cholesterol 174 with triglycerides 115, HDL 68, LDL 83.    When I saw her in October 2018 she was having issues with insomnia and was concerned that this could be secondary to losartan.  She was using Benadryl to help her sleep.  Typically she has been going to bed at 9:30, but typically may stay awake and total 1 AM.  If she goes to bed at 1 AM  she falls asleep much more quickly.  She has noticed poor sleep, frequent awakenings, daytime fatigue, and snoring.  She has been sleeping with the head of the bed elevated.  She has an extreme overbite.  During that evaluation, I recommended a trial of zolpidem and to help improve sleep initiation and recommend she undergo a sleep study.  I had not seen her since October 2018 and subsequently saw her in February 2021. In the interim she had  been evaluated [name], PA-C, [name], NP and recently by Dr. Domenic Polite.  In the past, her losartan was discontinued due to complaints of swelling in her throat.  She was started on hydralazine but ultimately developed issues with hydralazine leading to its discontinuance.  More recently she was started on spironolactone and was concerned perhaps that this may be contributing to swelling.  She ultimately underwent a home sleep study in January 2021 which demonstrated mild overall sleep apnea with an AHI of 5.3/h.  Oxygen nadir was 88%.  Because of concerns of her blood pressure medication I saw her on March 20, 2019 for follow-up evaluation with me and has requested that I continue to follow her cardiologically.  During that evaluation, her blood pressure was elevated and due to intolerances to numerous medications I added doxazosin initially 1 mg at  bedtime for 2 weeks with potential titration to 2 mg.  I also discussed options of CPAP therapy versus oral appliance.  She wished to pursue a customized oral appliance as initial treatment and I referred her to Dr. Ron Parker for evaluation.  I recommended aggressive lipid therapy with target LDL less than 70.  I saw her in June 2021 and over the prior several months her leg swelling had improved but she had developed pain in the back of her calf particularly on the right with cramping. She denied any shortness of breath.  She denied palpitations or chest pain.  Subsequent to that evaluation she had seen Dr. Scarlette Shorts of  GI for GERD and dyspepsia.  I last saw in November 25, 2019.  At that time, she denied any chest pain.  She never followed up for oral appliance evaluation.  She does not believe she is sleeping exceptionally well.  She continues to be on atorvastatin for hyperlipidemia, pantoprazole for GERD and is on spironolactone for blood pressure and swelling.  She now sees Dr. Jacalyn Lefevre at [facility] for primary care.    Presently, she admits to vague chest pressure which occurs intermittently.  She does walk.  She has had issues with urinary tract infection and has required antibiotic therapy, most recently with Augmentin.  She has been sleeping with her head propped up.  She admits to daytime fatigue.  She underwent a echo Doppler study on November 30, 2020 which showed an EF of 60 to 65% with grade 1 diastolic dysfunction.  She presents for evaluation.  Past Surgical History:  Procedure Laterality Date   ABDOMINAL HYSTERECTOMY  2003   COLONOSCOPY  2019   Dr. Henrene Pastor   CORONARY ARTERY BYPASS GRAFT  05/26/2011   Procedure: CORONARY ARTERY BYPASS GRAFTING (CABG);  Surgeon: Ivin Poot, MD;  Location: [facility];  Service: Open Heart Surgery;  Laterality: N/A;  Coronary Artery Bypass Graft on Pump times two utlizing left internal mammary artery and right greater saphenous vein harvested endoscopically    DIAGNOSTIC MAMMOGRAM  2019   [place] ANGIOGRAM N/A 05/24/2011   Procedure: LEFT HEART CATHETERIZATION WITH CORONARY ANGIOGRAM;  Surgeon: Leonie Man, MD;  Location: [facility] CATH LAB;  Service: Cardiovascular;  Laterality: N/A;   TONSILLECTOMY  1962   TUBAL LIGATION      Allergies  Allergen Reactions   Losartan Potassium Swelling    "Swelling in her throat"   Atorvastatin     Myalgias    Bactrim [Sulfamethoxazole-Trimethoprim]     vertigo   Cardura [Doxazosin]     edema   Crestor [Rosuvastatin]     myalgias   Eplerenone     fatigued    Hydrochlorothiazide Nausea And Vomiting    dizzy   Amlodipine Swelling   Cetirizine & Related Other (See Comments)    Extreme Drowsiness - "knocks me loopy for 2 days"   Latex Rash   Levofloxacin Nausea And Vomiting   Lisinopril Other (See Comments)    Excessive mucus production, swelling   Prednisone Other (See Comments)    High dose - hallucinations   Tape Rash    Current Outpatient Medications  Medication Sig Dispense Refill   aspirin EC 81 MG tablet Take 81 mg by mouth daily. Occasionally     atorvastatin (LIPITOR) 40 MG tablet Take 1 tablet (40 mg total) by mouth daily. 90 tablet 3   azelastine (ASTELIN) 0.1 % nasal spray Place 1 spray  into both nostrils 2 (two) times daily. 30 mL 12   chlorthalidone (HYGROTON) 25 MG tablet      Doxepin HCl 3 MG TABS Take 1 tablet by mouth at bedtime.     Iron-Vitamins (GERITOL PO) Take 1 tablet by mouth daily.     OVER THE COUNTER MEDICATION Take 1 capsule by mouth daily. Vitamin D3     pantoprazole (PROTONIX) 40 MG tablet Take 1 tablet (40 mg total) by mouth daily. 30 tablet 11   spironolactone (ALDACTONE) 25 MG tablet TAKE 2 TABLETS BY MOUTH EVERY DAY 180 tablet 2   traMADol (ULTRAM) 50 MG tablet Take 50 mg by mouth every 6 (six) hours as needed.     No current facility-administered medications for this visit.    Social History   Socioeconomic History   Marital status: Divorced    Spouse name: single   Number of children: Not on file   Years of education: Not on file   Highest education level: Not on file  Occupational History   Occupation: Retired   Tobacco Use   Smoking status: Former    Packs/day: 0.50    Years: 15.00    Pack years: 7.50    Types: Cigarettes    Quit date: 05/23/2006    Years since quitting: 14.6   Smokeless tobacco: Never  Vaping Use   Vaping Use: Never used  Substance and Sexual Activity   Alcohol use: Yes    Alcohol/week: 0.0 standard drinks    Comment: rare glass of wine   Drug use: No   Sexual  activity: Not Currently    Birth control/protection: Post-menopausal  Other Topics Concern   Not on file  Social History Narrative   Diet:  Fish and veggies - does not eat a lot of red meat   Do you drink/eat things with caffeine?  2 cups a day   Marital status:  Divorced   Do you live in a house, apartment, assisted living, condo, trailer, etc.)? Renting a one-story house   Is it one or more stories?  One   How many persons live in your home?  Lives alone   Do you have any pets in your home? (please list)  One dog   Education:  Some college   Current or past profession:  CNA   Do you exercise:  Yes - Walks 2-3 times a week.      ADVANCED DIRECTIVES  None.  Does want CPR.       FUNCTIONAL STATUS   Has no difficulties with ADLs.   Social Determinants of Health   Financial Resource Strain: Not on file  Food Insecurity: Not on file  Transportation Needs: Not on file  Physical Activity: Not on file  Stress: Not on file  Social Connections: Not on file  Intimate Partner Violence: Not on file    Family History  Problem Relation Age of Onset   Mental illness Mother    Alcohol abuse Mother    Drug abuse Sister    Cancer Brother        head and neck cancer   Alcohol abuse Sister    Pancreatitis Sister    Hypertension Daughter    Fibroids Daughter    Lung cancer Maternal Grandmother    Heart disease Maternal Grandfather    Colon cancer Neg Hx    Breast cancer Neg Hx    Esophageal cancer Neg Hx    Rectal cancer Neg Hx    Stomach cancer  Neg Hx     ROS General: Negative; No fevers, chills, or night sweats HEENT: Negative; No changes in vision or hearing, sinus congestion, difficulty swallowing Pulmonary: Negative; No cough, wheezing, shortness of breath, hemoptysis Cardiovascular: See HPI: No chest pain, presyncope, syncope, palpatations GI: Positive for GERD, evaluated by Dr. Henrene Pastor;  No nausea, vomiting, diarrhea, or abdominal pain GU: Positive for cyst on kidney; No  dysuria, hematuria, or difficulty voiding Musculoskeletal: Positive for occasional muscle spasm Hematologic: Negative; no easy bruising, bleeding Endocrine: Negative; no heat/cold intolerance; no diabetes, Neuro: Negative; no changes in balance, headaches Skin: Negative; No rashes or skin lesions Psychiatric: Negative; No behavioral problems, depression Sleep: Positive for mild sleep apnea, snoring,  negative; no bruxism, restless legs, hypnogognic hallucinations. Other comprehensive 14 point system review is negative   Physical Exam BP 138/76   Pulse (!) 56   Ht _0  (1.626 m)   Wt 169 lb 6.4 oz (76.8 kg)   SpO2 96%   BMI 29.08 kg/m    Repeat blood pressure by me was 138/68  Wt Readings from Last 3 Encounters:  12/28/20 169 lb (76.7 kg)  12/22/20 169 lb 6.4 oz (76.8 kg)  12/15/20 170 lb (77.1 kg)   General: Alert, oriented, no distress.  Skin: normal turgor, no rashes, warm and dry HEENT: Normocephalic, atraumatic. Pupils equal round and reactive to light; sclera anicteric; extraocular muscles intact;  Nose without nasal septal hypertrophy Mouth/Parynx benign; Mallinpatti scale 3 Neck: No JVD, no carotid bruits; normal carotid upstroke Lungs: clear to ausculatation and percussion; no wheezing or rales Chest wall: without tenderness to palpitation Heart: PMI not displaced, RRR, s1 s2 normal, 1/6 systolic murmur, no diastolic murmur, no rubs, gallops, thrills, or heaves Abdomen: soft, nontender; no hepatosplenomehaly, BS+; abdominal aorta nontender and not dilated by palpation. Back: no CVA tenderness Pulses 2+ Musculoskeletal: full range of motion, normal strength, no joint deformities Extremities: no clubbing cyanosis or edema, Homan's sign negative  Neurologic: grossly nonfocal; Cranial nerves grossly wnl Psychologic: Normal mood and affect   December 22, 2020 ECG (independently read by me): Sinus bradycardia at 56, nonspecific STT changes  November 25, 2019 ECG  (independently read by me): NSR at 63; nonspecific ST changes    June 7, 2021ECG (independently read by me): Sinus bradycardia at 55; nondiagnostic T wave abnormality in V1-2  04/09/2019 ECG (independently read by me): Sinus Bradycardia at 57; normal intervals  October 2018 ECG (independently read by me): Sinus bradycardia at 58 bpm.  Nonspecific ST-T changes.  QTc interval 394 ms.  PR interval 140 ms.  October 2016 ECG (independently read by me): Sinus bradycardia 53 bpm.  No ectopy.  Normal intervals.  August 2015 ECG (independently read by me): Sinus bradycardia at 47 beats per minute.  Nonspecific T changes.  QTc interval   385 ms  LABS: BMP Latest Ref Rng & Units 11/16/2020 11/08/2020 11/04/2020  Glucose 70 - 99 mg/dL 89 81 101(H)  BUN 8 - 27 mg/dL _1 Creatinine 0.57 - 1.00 mg/dL 0.98 0.69 0.76  BUN/Creat Ratio 12 - _2 -  Sodium 134 - 144 mmol/L 142 140 139  Potassium 3.5 - 5.2 mmol/L 4.8 4.8 4.5  Chloride 96 - 106 mmol/L 102 102 103  CO2 20 - 29 mmol/L _3 Calcium 8.7 - 10.3 mg/dL 9.6 9.6 9.4   Hepatic Function Latest Ref Rng & Units 11/04/2020 07/02/2020 06/18/2019  Total Protein 6.5 - 8.1 g/dL 7.0 6.5 -  Albumin 3.5 - 5.0 g/dL 4.2 4.5 4.5  AST 15 - 41 U/L _0 ALT 0 - 44 U/L _1 Alk Phosphatase 38 - 126 U/L 87 85 93  Total Bilirubin 0.3 - 1.2 mg/dL 0.2(L) 0.4 -   CBC Latest Ref Rng & Units 11/16/2020 11/04/2020 07/02/2020  WBC 3.4 - 10.8 x10E3/uL 11.0(H) 11.0(H) 7.0  Hemoglobin 11.1 - 15.9 g/dL 15.6 14.5 15.1  Hematocrit 34.0 - 46.6 % 46.2 44.8 45.7  Platelets 150 - 450 x10E3/uL 463(H) 398 416   Lab Results  Component Value Date   MCV 83 11/16/2020   MCV 87.8 11/04/2020   MCV 85 07/02/2020   Lab Results  Component Value Date   TSH 1.980 11/16/2020   Lab Results  Component Value Date   HGBA1C 5.9 07/02/2020   Lipid Panel     Component Value Date/Time   CHOL 157 07/02/2020 0913   TRIG 85 07/02/2020 0913   HDL 58 07/02/2020 0913    CHOLHDL 2.7 07/02/2020 0913   CHOLHDL 2.3 11/18/2014 0928   VLDL 15 11/18/2014 0928   LDLCALC 83 07/02/2020 0913   RADIOLOGY: No results found.  IMPRESSION:  1. CAD in native artery   2. S/P CABG x 2   3. Essential hypertension   4. Hyperlipidemia LDL goal <70   5. OSA (obstructive sleep apnea)   6. Gastroesophageal reflux disease without esophagitis     ASSESSMENT AND PLAN: Ms. Chanita Boden is a very pleasant 70 year old female who underwent CABG revascularization surgery in April 2013 after she was found to have high-grade left main stenosis.  A  2014 nuclear study showed normal perfusion and hyperdynamic LV function.  She is not having any anginal symptomatology.  She has had numerous intolerances to medications and reportedly could not tolerate losartan, hydralazine, amlodipine, lisinopril.  She was subsequently started on spironolactone which has been titrated to 25 mg twice a day.  When I saw her in June, she had complaints of lower extremity discomfort.  A venous Doppler study was negative for DVT.  Presently, her blood pressure on repeat by me was 138/68.  I reviewed her most recent echo Doppler study from November 30, 2020 which showed normal LV function with EF at 60 to 65% with grade 1 diastolic dysfunction.  She has experienced some episodes of vague chest pressure not typically exertionally precipitated.  She has continued issues with daytime fatigability and sleeps with her head propped up.  Recent laboratory in May 2022 had shown an LDL cholesterol at 83 with total cholesterol 157 and triglycerides 85.  With her known CAD I have recommended further titration of atorvastatin from 20 mg to 40 mg daily with target LDL less than 70.  She continues to be on chlorthalidone 12.5 mg and spironolactone 25 mg twice a day.  With her occasional chest pressure and her known CAD 9 years status post CABG revascularization I have recommended she undergo a [place] study for further  evaluation.  I also have strongly recommended she undergo an in lab sleep study for reassessment of her sleep apnea particularly since she never pursued the oral appliance and continues to have issues with sleep.  I am recommending follow-up laboratory in 2 months with a comprehensive metabolic panel and lipid studies and will see her in follow-up for further evaluation   Troy Sine, MD, [facility]  01/02/2021 7:21 PM

## 2020-12-22 NOTE — Patient Instructions (Signed)
Medication Instructions:  Increase atorvastatin to 40 mg each day. *If you need a refill on your cardiac medications before your next appointment, please call your pharmacy*   Lab Work: Return in 2 months for fasting blood work (5 days before your next appointment) for cmet and lipids. If you have labs (blood work) drawn today and your tests are completely normal, you will receive your results only by: [place] (if you have MyChart) OR A paper copy in the mail If you have any lab test that is abnormal or we need to change your treatment, we will call you to review the results.   Testing/Procedures: Schedule a sleep study  2. You are scheduled for a Myocardial Perfusion Imaging Study on:   at .  Please arrive 15 minutes prior to your appointment time for registration and insurance purposes.  The test will take approximately 3 to 4 hours to complete; you may bring reading material.  If someone comes with you to your appointment, they will need to remain in the main lobby due to limited space in the testing area. **If you are pregnant or breastfeeding, please notify the nuclear lab prior to your appointment**  How to prepare for your Myocardial Perfusion Test: Do not eat or drink 3 hours prior to your test, except you may have water. Do not consume products containing caffeine (regular or decaffeinated) 12 hours prior to your test. (ex: coffee, chocolate, sodas, tea).  Do wear comfortable clothes (no dresses or overalls) and walking shoes, tennis shoes preferred (No heels or open toe shoes are allowed). Do NOT wear cologne, perfume, aftershave, or lotions (deodorant is allowed). If these instructions are not followed, your test will have to be rescheduled.   If you cannot keep your appointment, please provide 24 hours notification to the Nuclear Lab, to avoid a possible $50 charge to your account.    Follow-Up: At [facility], you and your health needs are our priority.  As  part of our continuing mission to provide you with exceptional heart care, we have created designated Provider Care Teams.  These Care Teams include your primary Cardiologist (physician) and Advanced Practice Providers (APPs -  Physician Assistants and Nurse Practitioners) who all work together to provide you with the care you need, when you need it.  We recommend signing up for the patient portal called "MyChart".  Sign up information is provided on this After Visit Summary.  MyChart is used to connect with patients for Virtual Visits (Telemedicine).  Patients are able to view lab/test results, encounter notes, upcoming appointments, etc.  Non-urgent messages can be sent to your provider as well.   To learn more about what you can do with MyChart, go to [email or web address].    Your next appointment:   2 month(s)  The format for your next appointment:   In Person  Provider:   Dr. Corky Downs, MD

## 2020-12-24 ENCOUNTER — Telehealth (HOSPITAL_COMMUNITY): Payer: Self-pay | Admitting: *Deleted

## 2020-12-24 NOTE — Telephone Encounter (Signed)
Close encounter 

## 2020-12-27 ENCOUNTER — Ambulatory Visit
Admission: RE | Admit: 2020-12-27 | Discharge: 2020-12-27 | Disposition: A | Discharge status: Home / Self Care | Payer: Medicare HMO | Source: Ambulatory Visit | Attending: Family Medicine | Admitting: Family Medicine

## 2020-12-27 DIAGNOSIS — Z1231 Encounter for screening mammogram for malignant neoplasm of breast: Secondary | ICD-10-CM | POA: Diagnosis not present

## 2020-12-28 ENCOUNTER — Other Ambulatory Visit: Payer: Self-pay | Admitting: Cardiovascular Disease

## 2020-12-28 ENCOUNTER — Ambulatory Visit (HOSPITAL_COMMUNITY)
Admission: RE | Admit: 2020-12-28 | Discharge: 2020-12-28 | Disposition: A | Discharge status: Home / Self Care | Payer: Medicare HMO | Source: Ambulatory Visit | Attending: Cardiovascular Disease | Admitting: Cardiovascular Disease

## 2020-12-28 ENCOUNTER — Other Ambulatory Visit: Payer: Self-pay

## 2020-12-28 DIAGNOSIS — G4733 Obstructive sleep apnea (adult) (pediatric): Secondary | ICD-10-CM | POA: Diagnosis not present

## 2020-12-28 DIAGNOSIS — I2510 Atherosclerotic heart disease of native coronary artery without angina pectoris: Secondary | ICD-10-CM | POA: Diagnosis not present

## 2020-12-28 DIAGNOSIS — E785 Hyperlipidemia, unspecified: Secondary | ICD-10-CM | POA: Diagnosis not present

## 2020-12-28 LAB — MYOCARDIAL PERFUSION IMAGING
LV dias vol: 81 mL (ref 46–106)
LV sys vol: 25 mL
Nuc Stress EF: 69 %
Peak HR: 96 {beats}/min
Rest HR: 54 {beats}/min
Rest Nuclear Isotope Dose: 8.4 mCi
SDS: 0
SRS: 1
SSS: 1
ST Depression (mm): 0 mm
Stress Nuclear Isotope Dose: 25.2 mCi
TID: 1.06

## 2020-12-28 MED ORDER — TECHNETIUM TC 99M TETROFOSMIN IV KIT
25.2000 | PACK | Freq: Once | INTRAVENOUS | Status: AC | PRN
  Administered 2020-12-28: 25.2 via INTRAVENOUS
  Filled 2020-12-28: qty 26

## 2020-12-28 MED ORDER — REGADENOSON 0.4 MG/5ML IV SOLN
0.4000 mg | Freq: Once | INTRAVENOUS | Status: AC
  Administered 2020-12-28: 0.4 mg via INTRAVENOUS

## 2020-12-28 MED ORDER — AMINOPHYLLINE 25 MG/ML IV SOLN
75.0000 mg | Freq: Once | INTRAVENOUS | Status: AC
  Administered 2020-12-28: 75 mg via INTRAVENOUS

## 2020-12-28 MED ORDER — TECHNETIUM TC 99M TETROFOSMIN IV KIT
8.4000 | PACK | Freq: Once | INTRAVENOUS | Status: AC | PRN
  Administered 2020-12-28: 8.4 via INTRAVENOUS
  Filled 2020-12-28: qty 9

## 2020-12-28 NOTE — Progress Notes (Signed)
Attestation for stress test signed.   [place] T. Audie Box, MD, [place]  [address] [place], [place] [address] [phone]  9:13 AM

## 2021-01-02 ENCOUNTER — Encounter: Payer: Self-pay | Admitting: Cardiovascular Disease

## 2021-01-17 ENCOUNTER — Encounter: Payer: Self-pay | Admitting: Family Medicine

## 2021-01-18 ENCOUNTER — Other Ambulatory Visit: Payer: Self-pay | Admitting: Family Medicine

## 2021-01-18 DIAGNOSIS — L989 Disorder of the skin and subcutaneous tissue, unspecified: Secondary | ICD-10-CM

## 2021-01-26 ENCOUNTER — Telehealth: Payer: Self-pay

## 2021-01-26 NOTE — Telephone Encounter (Signed)
Letter has been sent to patient instructing them to call us if they are still interested in completing their sleep study. If we have not received a response from the patient within 30 days of this notice, the order will be cancelled and they will need to discuss the need for a sleep study at their next office visit.  ° °

## 2021-02-08 ENCOUNTER — Encounter: Payer: Self-pay | Admitting: Nurse Practitioner

## 2021-02-08 ENCOUNTER — Ambulatory Visit (INDEPENDENT_AMBULATORY_CARE_PROVIDER_SITE_OTHER): Payer: Medicare HMO | Admitting: Nurse Practitioner

## 2021-02-08 ENCOUNTER — Encounter: Payer: Self-pay | Admitting: Family Medicine

## 2021-02-08 VITALS — BP 136/74 | HR 63 | Temp 98.3°F | Resp 20 | Ht 64.0 in | Wt 173.0 lb

## 2021-02-08 DIAGNOSIS — R300 Dysuria: Secondary | ICD-10-CM | POA: Diagnosis not present

## 2021-02-08 DIAGNOSIS — N3000 Acute cystitis without hematuria: Secondary | ICD-10-CM | POA: Diagnosis not present

## 2021-02-08 LAB — URINALYSIS, COMPLETE
Bilirubin, UA: NEGATIVE
Glucose, UA: NEGATIVE
Nitrite, UA: POSITIVE — AB
Protein,UA: NEGATIVE
Specific Gravity, UA: 1.02 (ref 1.005–1.030)
Urobilinogen, Ur: 0.2 mg/dL (ref 0.2–1.0)
pH, UA: 7 (ref 5.0–7.5)

## 2021-02-08 LAB — MICROSCOPIC EXAMINATION: Renal Epithel, UA: NONE SEEN /hpf

## 2021-02-08 MED ORDER — DOXYCYCLINE HYCLATE 100 MG PO TABS: 100.0000 mg | ORAL_TABLET | Freq: Two times a day (BID) | ORAL | 0 refills | Status: DC

## 2021-02-08 NOTE — Progress Notes (Addendum)
° °  Subjective:    Patient ID: [name] [name], female    DOB: 12/24/1950, 70 y.o.   MRN: [id number]  Chief Complaint: Dysuria, mucus in stool, Nausea, and Headache   HPI Patient comes in today with: - frequency and urgency over a week. Has gradually gotten worse.  - headaches and nausea with dizziness. This started a=several days after UTI symptoms started. - mucus in her stool today. No abdominal pain, diarrhea or constipation.   Review of Systems  Constitutional:  Negative for diaphoresis.  Eyes:  Negative for pain.  Respiratory:  Negative for shortness of breath.   Cardiovascular:  Negative for chest pain, palpitations and leg swelling.  Gastrointestinal:  Positive for nausea. Negative for abdominal pain.  Endocrine: Negative for polydipsia.  Genitourinary:  Positive for dysuria, flank pain, frequency and urgency. Negative for hematuria.  Skin:  Negative for rash.  Neurological:  Negative for dizziness, weakness and headaches.  Hematological:  Does not bruise/bleed easily.  All other systems reviewed and are negative.     Objective:   Physical Exam Vitals reviewed.  Constitutional:      Appearance: She is well-developed.  Cardiovascular:     Rate and Rhythm: Normal rate and regular rhythm.  Pulmonary:     Breath sounds: Normal breath sounds.  Abdominal:     General: Bowel sounds are normal.     Tenderness: There is no right CVA tenderness or left CVA tenderness.  Skin:    General: Skin is warm.  Neurological:     Mental Status: She is alert.    BP 136/74 (BP Location: Left Arm, Cuff Size: Normal)    Pulse 63    Temp 98.3 F (36.8 C) (Temporal)    Resp 20    Ht 5\' 4"  (1.626 m)    Wt 173 lb (78.5 kg)    SpO2 96%    BMI 29.70 kg/m   UA + nitrtites        Assessment & Plan:   [name] [name] in today with chief complaint of Dysuria (Wants to be checked for cancer of the bladder), mucus in stool, Nausea, and Headache   1. Dysuria  Urinalysis, Complete  2. Acute  cystitis without hematuria Take medication as prescribe Cotton underwear Take shower not bath Cranberry juice, yogurt Force fluids AZO over the counter X2 days Culture pending Hygiene discussed to prevent UTI RTO prn  - Urine Culture  3. nausea Probabaly due to UTI      The above assessment and management plan was discussed with the patient. The patient verbalized understanding of and has agreed to the management plan. Patient is aware to call the clinic if symptoms persist or worsen. Patient is aware when to return to the clinic for a follow-up visit. Patient educated on when it is appropriate to go to the emergency department.   Mary-Margaret Hassell Done, FNP

## 2021-02-08 NOTE — Patient Instructions (Signed)

## 2021-02-11 LAB — URINE CULTURE

## 2021-02-24 ENCOUNTER — Ambulatory Visit: Payer: Medicare HMO | Admitting: Cardiovascular Disease

## 2021-02-28 DIAGNOSIS — L820 Inflamed seborrheic keratosis: Secondary | ICD-10-CM | POA: Diagnosis not present

## 2021-02-28 DIAGNOSIS — L570 Actinic keratosis: Secondary | ICD-10-CM | POA: Diagnosis not present

## 2021-02-28 DIAGNOSIS — D485 Neoplasm of uncertain behavior of skin: Secondary | ICD-10-CM | POA: Diagnosis not present

## 2021-03-14 ENCOUNTER — Telehealth: Payer: Self-pay | Admitting: Family Medicine

## 2021-03-21 ENCOUNTER — Encounter: Payer: Self-pay | Admitting: Nurse Practitioner

## 2021-03-21 ENCOUNTER — Ambulatory Visit (INDEPENDENT_AMBULATORY_CARE_PROVIDER_SITE_OTHER): Payer: Medicare Other | Admitting: Nurse Practitioner

## 2021-03-21 VITALS — BP 137/75 | HR 58 | Temp 98.8°F | Ht 64.0 in | Wt 169.0 lb

## 2021-03-21 DIAGNOSIS — R300 Dysuria: Secondary | ICD-10-CM | POA: Diagnosis not present

## 2021-03-21 LAB — URINALYSIS, ROUTINE W REFLEX MICROSCOPIC
Bilirubin, UA: NEGATIVE
Glucose, UA: NEGATIVE
Ketones, UA: NEGATIVE
Nitrite, UA: NEGATIVE
Protein,UA: NEGATIVE
Specific Gravity, UA: 1.02 (ref 1.005–1.030)
Urobilinogen, Ur: 0.2 mg/dL (ref 0.2–1.0)
pH, UA: 5.5 (ref 5.0–7.5)

## 2021-03-21 LAB — MICROSCOPIC EXAMINATION
RBC, Urine: NONE SEEN /hpf (ref 0–2)
Renal Epithel, UA: NONE SEEN /hpf
WBC, UA: >30 /hpf — AB (ref 0–5)

## 2021-03-21 MED ORDER — AMOXICILLIN-POT CLAVULANATE 875-125 MG PO TABS: 1.0000 | ORAL_TABLET | Freq: Two times a day (BID) | ORAL | 0 refills | Status: DC

## 2021-03-21 MED ORDER — PHENAZOPYRIDINE HCL 95 MG PO TABS: 95.0000 mg | ORAL_TABLET | Freq: Three times a day (TID) | ORAL | 0 refills | Status: DC | PRN

## 2021-03-21 NOTE — Patient Instructions (Signed)
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
## 2021-03-22 ENCOUNTER — Telehealth (INDEPENDENT_AMBULATORY_CARE_PROVIDER_SITE_OTHER): Payer: Medicare Other | Admitting: Nurse Practitioner

## 2021-03-22 ENCOUNTER — Encounter: Payer: Self-pay | Admitting: Nurse Practitioner

## 2021-03-22 DIAGNOSIS — M79641 Pain in right hand: Secondary | ICD-10-CM | POA: Diagnosis not present
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
## 2021-03-23 LAB — URINE CULTURE

## 2021-04-18 ENCOUNTER — Ambulatory Visit: Payer: Medicare HMO | Admitting: Physician Assistant

## 2021-04-29 ENCOUNTER — Other Ambulatory Visit: Payer: Self-pay | Admitting: General Practice

## 2021-05-18 DIAGNOSIS — I10 Essential (primary) hypertension: Secondary | ICD-10-CM | POA: Diagnosis not present

## 2021-05-18 DIAGNOSIS — R42 Dizziness and giddiness: Secondary | ICD-10-CM | POA: Diagnosis not present

## 2021-05-25 ENCOUNTER — Telehealth: Payer: Self-pay | Admitting: Family Medicine

## 2021-05-25 ENCOUNTER — Encounter: Payer: Self-pay | Admitting: Family Medicine

## 2021-05-25 ENCOUNTER — Ambulatory Visit (INDEPENDENT_AMBULATORY_CARE_PROVIDER_SITE_OTHER): Payer: Medicare Other | Admitting: Family Medicine

## 2021-05-25 VITALS — BP 143/69 | HR 65 | Temp 98.3°F | Ht 64.0 in | Wt 163.6 lb

## 2021-05-25 DIAGNOSIS — K449 Diaphragmatic hernia without obstruction or gangrene: Secondary | ICD-10-CM

## 2021-05-25 DIAGNOSIS — H938X2 Other specified disorders of left ear: Secondary | ICD-10-CM

## 2021-05-25 DIAGNOSIS — K582 Mixed irritable bowel syndrome: Secondary | ICD-10-CM | POA: Diagnosis not present

## 2021-05-25 DIAGNOSIS — R638 Other symptoms and signs concerning food and fluid intake: Secondary | ICD-10-CM | POA: Diagnosis not present

## 2021-05-25 DIAGNOSIS — R739 Hyperglycemia, unspecified: Secondary | ICD-10-CM | POA: Diagnosis not present

## 2021-05-25 DIAGNOSIS — E663 Overweight: Secondary | ICD-10-CM

## 2021-05-25 LAB — BAYER DCA HB A1C WAIVED: HB A1C (BAYER DCA - WAIVED): 5.7 % — ABNORMAL HIGH (ref 4.8–5.6)

## 2021-05-25 MED ORDER — RIFAXIMIN 550 MG PO TABS: 550.0000 mg | ORAL_TABLET | Freq: Three times a day (TID) | ORAL | 0 refills | Status: AC

## 2021-05-25 NOTE — Telephone Encounter (Signed)
Pt says that [facility] in [place] told pt she needs PA on rifaximin (XIFAXAN) 550 MG TABS tablet. ?
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
## 2021-05-25 NOTE — Patient Instructions (Signed)
You had labs performed today.  You will be contacted with the results of the labs once they are available, usually in the next 3 business days for routine lab work.  If you have an active my chart account, they will be released to your MyChart.  If you prefer to have these labs released to you via telephone, please let us know. ? ? Irritable Bowel Syndrome, Adult ?Irritable bowel syndrome (IBS) is a group of symptoms that affects the organs responsible for digestion (gastrointestinal or GI tract). IBS is not one specific disease. ?To regulate how the GI tract works, the body sends signals back and forth between the intestines and the brain. If you have IBS, there may be a problem with these signals. As a result, the GI tract does not function normally. The intestines may become more sensitive and overreact to certain things. This may be especially true when you eat certain foods or when you are under stress. ?There are four types of IBS. These may be determined based on the consistency of your stool (feces): ?IBS with diarrhea. ?IBS with constipation. ?Mixed IBS. ?Unsubtyped IBS. ?It is important to know which type of IBS you have. Certain treatments are more likely to be helpful for certain types of IBS. ?What are the causes? ?The exact cause of IBS is not known. ?What increases the risk? ?You may have a higher risk for IBS if you: ?Are female. ?Are younger than 59. ?Have a family history of IBS. ?Have a mental health condition, such as depression, anxiety, or post-traumatic stress disorder. ?Have had a bacterial infection of your GI tract. ?What are the signs or symptoms? ?Symptoms of IBS vary from person to person. The main symptom is abdominal pain or discomfort. Other symptoms usually include one or more of the following: ?Diarrhea, constipation, or both. ?Abdominal swelling or bloating. ?Feeling full after eating a small or regular-sized meal. ?Frequent gas. ?Mucus in the stool. ?A feeling of having more stool  left after a bowel movement. ?Symptoms tend to come and go. They may be triggered by stress, mental health conditions, or certain foods. ?How is this diagnosed? ?This condition may be diagnosed based on a physical exam, your medical history, and your symptoms. You may have tests, such as: ?Blood tests. ?Stool test. ?X-rays. ?CT scan. ?Colonoscopy. This is a procedure in which your GI tract is viewed with a long, thin, flexible tube. ?How is this treated? ?There is no cure for IBS, but treatment can help relieve symptoms. Treatment depends on the type of IBS you have, and may include: ?Changes to your diet, such as: ?Avoiding foods that cause symptoms. ?Drinking more water. ?Following a low-FODMAP (fermentable oligosaccharides, disaccharides, monosaccharides, and polyols) diet for up to 6 weeks, or as told by your health care provider. FODMAPs are sugars that are hard for some people to digest. ?Eating more fiber. ?Eating medium-sized meals at the same times every day. ?Medicines. These may include: ?Fiber supplements, if you have constipation. ?Medicine to control diarrhea (antidiarrheal medicines). ?Medicine to help control muscle tightening (spasms) in your GI tract (antispasmodic medicines). ?Medicines to help with mental health conditions, such as antidepressants or tranquilizers. ?Talk therapy or counseling. ?Working with a diet and nutrition specialist (dietitian) to help create a food plan that is right for you. ?Managing your stress. ?Follow these instructions at home: ?Eating and drinking ?Eat a healthy diet. ?Eat medium-sized meals at about the same time every day. Do not eat large meals. ?Gradually eat more fiber-rich foods.  These include whole grains, fruits, and vegetables. This may be especially helpful if you have IBS with constipation. ?Eat a diet low in FODMAPs. ?Drink enough fluid to keep your urine pale yellow. ?Keep a journal of foods that seem to trigger symptoms. ?Avoid foods and drinks  that: ?Contain added sugar. ?Make your symptoms worse. Dairy products, caffeinated drinks, and carbonated drinks can make symptoms worse for some people. ?General instructions ?Take over-the-counter and prescription medicines and supplements only as told by your health care provider. ?Get enough exercise. Do at least 150 minutes of moderate-intensity exercise each week. ?Manage your stress. Getting enough sleep and exercise can help you manage stress. ?Keep all follow-up visits as told by your health care provider and therapist. This is important. ?Alcohol Use ?Do not drink alcohol if: ?Your health care provider tells you not to drink. ?You are pregnant, may be pregnant, or are planning to become pregnant. ?If you drink alcohol, limit how much you have: ?0-1 drink a day for women. ?0-2 drinks a day for men. ?Be aware of how much alcohol is in your drink. In the [place] one drink equals one typical bottle of beer (12 oz), one-half glass of wine (5 oz), or one shot of hard liquor (1? oz). ?Contact a health care provider if you have: ?Constant pain. ?Weight loss. ?Difficulty or pain when swallowing. ?Diarrhea that gets worse. ?Get help right away if you have: ?Severe abdominal pain. ?Fever. ?Diarrhea with symptoms of dehydration, such as dizziness or dry mouth. ?Bright red blood in your stool. ?Stool that is black and tarry. ?Abdominal swelling. ?Vomiting that does not stop. ?Blood in your vomit. ?Summary ?Irritable bowel syndrome (IBS) is not one specific disease. It is a group of symptoms that affects digestion. ?Your intestines may become more sensitive and overreact to certain things. This may be especially true when you eat certain foods or when you are under stress. ?There is no cure for IBS, but treatment can help relieve symptoms. ?This information is not intended to replace advice given to you by your health care provider. Make sure you discuss any questions you have with your health care provider. ?Document  Revised: 09/25/2019 Document Reviewed: 10/02/2019 ?Elsevier Patient Education ? 2022 [place]. ? ? ?

## 2021-05-26 ENCOUNTER — Encounter: Payer: Self-pay | Admitting: Family Medicine

## 2021-05-26 ENCOUNTER — Other Ambulatory Visit: Payer: Self-pay | Admitting: Family Medicine

## 2021-05-26 DIAGNOSIS — R7989 Other specified abnormal findings of blood chemistry: Secondary | ICD-10-CM

## 2021-05-26 LAB — CMP14+EGFR
ALT: 19 IU/L (ref 0–32)
AST: 19 IU/L (ref 0–40)
Albumin/Globulin Ratio: 2.7 — ABNORMAL HIGH (ref 1.2–2.2)
Albumin: 4.3 g/dL (ref 3.8–4.8)
Alkaline Phosphatase: 92 IU/L (ref 44–121)
BUN/Creatinine Ratio: 14 (ref 12–28)
BUN: 11 mg/dL (ref 8–27)
Bilirubin Total: 0.3 mg/dL (ref 0.0–1.2)
CO2: 25 mmol/L (ref 20–29)
Calcium: 9.8 mg/dL (ref 8.7–10.3)
Chloride: 104 mmol/L (ref 96–106)
Creatinine, Ser: 0.76 mg/dL (ref 0.57–1.00)
Globulin, Total: 1.6 g/dL (ref 1.5–4.5)
Glucose: 99 mg/dL (ref 70–99)
Potassium: 4.8 mmol/L (ref 3.5–5.2)
Sodium: 140 mmol/L (ref 134–144)
Total Protein: 5.9 g/dL — ABNORMAL LOW (ref 6.0–8.5)
eGFR: 84 mL/min/{1.73_m2} (ref >59)

## 2021-05-26 LAB — CBC
Hematocrit: 44.1 % (ref 34.0–46.6)
Hemoglobin: 14.8 g/dL (ref 11.1–15.9)
MCH: 27.9 pg (ref 26.6–33.0)
MCHC: 33.6 g/dL (ref 31.5–35.7)
MCV: 83 fL (ref 79–97)
Platelets: 461 10*3/uL — ABNORMAL HIGH (ref 150–450)
RBC: 5.3 x10E6/uL — ABNORMAL HIGH (ref 3.77–5.28)
RDW: 13.4 % (ref 11.7–15.4)
WBC: 6.3 10*3/uL (ref 3.4–10.8)

## 2021-05-26 LAB — LIPID PANEL
Chol/HDL Ratio: 2.4 ratio (ref 0.0–4.4)
Cholesterol, Total: 120 mg/dL (ref 100–199)
HDL: 50 mg/dL (ref >39)
LDL Chol Calc (NIH): 56 mg/dL (ref 0–99)
Triglycerides: 68 mg/dL (ref 0–149)
VLDL Cholesterol Cal: 14 mg/dL (ref 5–40)

## 2021-05-26 LAB — T4, FREE: Free T4: 1.48 ng/dL (ref 0.82–1.77)

## 2021-05-26 LAB — INSULIN, RANDOM: INSULIN: 12.8 u[IU]/mL (ref 2.6–24.9)

## 2021-05-26 LAB — TSH: TSH: 1.03 u[IU]/mL (ref 0.450–4.500)

## 2021-05-27 ENCOUNTER — Other Ambulatory Visit: Payer: Self-pay | Admitting: Family Medicine

## 2021-05-27 MED ORDER — DIPHENOXYLATE-ATROPINE 2.5-0.025 MG PO TABS: 2.0000 | ORAL_TABLET | Freq: Four times a day (QID) | ORAL | 0 refills | Status: DC | PRN

## 2021-05-27 NOTE — Telephone Encounter (Signed)
Patient states she can not wait for this PA. She needs something the today for gastritis. SHe is complaining she can not even leave the house. Patient is asking for something to be sent to the pharmacy something else.  ?

## 2021-05-27 NOTE — Telephone Encounter (Signed)
Your information has been sent to [facility]. 

## 2021-05-27 NOTE — Telephone Encounter (Signed)
I sent in Lomotil.  That should help in the short-term until the Xifaxan can be approved. ?

## 2021-05-27 NOTE — Telephone Encounter (Signed)
Patient aware and verbalized understanding. °

## 2021-05-30 ENCOUNTER — Encounter (HOSPITAL_COMMUNITY): Payer: Self-pay | Admitting: Hematology

## 2021-05-30 ENCOUNTER — Inpatient Hospital Stay (HOSPITAL_COMMUNITY): Payer: Medicare Other | Attending: Hematology | Admitting: Hematology

## 2021-05-30 ENCOUNTER — Encounter: Payer: Self-pay | Admitting: Family Medicine

## 2021-05-30 ENCOUNTER — Inpatient Hospital Stay (HOSPITAL_COMMUNITY): Payer: Medicare Other

## 2021-05-30 VITALS — BP 150/70 | HR 60 | Temp 98.7°F | Resp 18 | Ht 63.78 in | Wt 166.7 lb

## 2021-05-30 DIAGNOSIS — K219 Gastro-esophageal reflux disease without esophagitis: Secondary | ICD-10-CM | POA: Insufficient documentation

## 2021-05-30 DIAGNOSIS — R718 Other abnormality of red blood cells: Secondary | ICD-10-CM | POA: Insufficient documentation

## 2021-05-30 DIAGNOSIS — Z801 Family history of malignant neoplasm of trachea, bronchus and lung: Secondary | ICD-10-CM | POA: Insufficient documentation

## 2021-05-30 DIAGNOSIS — D75839 Thrombocytosis, unspecified: Secondary | ICD-10-CM | POA: Diagnosis not present

## 2021-05-30 DIAGNOSIS — Z79899 Other long term (current) drug therapy: Secondary | ICD-10-CM | POA: Insufficient documentation

## 2021-05-30 DIAGNOSIS — Z8719 Personal history of other diseases of the digestive system: Secondary | ICD-10-CM | POA: Insufficient documentation

## 2021-05-30 DIAGNOSIS — I10 Essential (primary) hypertension: Secondary | ICD-10-CM | POA: Diagnosis not present

## 2021-05-30 LAB — FERRITIN: Ferritin: 50 ng/mL (ref 11–307)

## 2021-05-30 LAB — IRON AND TIBC
Iron: 48 ug/dL (ref 28–170)
Saturation Ratios: 14 % (ref 10.4–31.8)
TIBC: 349 ug/dL (ref 250–450)
UIBC: 301 ug/dL

## 2021-05-30 LAB — CBC WITH DIFFERENTIAL/PLATELET
Abs Immature Granulocytes: 0.02 10*3/uL (ref 0.00–0.07)
Basophils Absolute: 0.1 10*3/uL (ref 0.0–0.1)
Basophils Relative: 1 %
Eosinophils Absolute: 0.2 10*3/uL (ref 0.0–0.5)
Eosinophils Relative: 3 %
HCT: 45.4 % (ref 36.0–46.0)
Hemoglobin: 14.8 g/dL (ref 12.0–15.0)
Immature Granulocytes: 0 %
Lymphocytes Relative: 40 %
Lymphs Abs: 3.1 10*3/uL (ref 0.7–4.0)
MCH: 28.2 pg (ref 26.0–34.0)
MCHC: 32.6 g/dL (ref 30.0–36.0)
MCV: 86.6 fL (ref 80.0–100.0)
Monocytes Absolute: 0.6 10*3/uL (ref 0.1–1.0)
Monocytes Relative: 8 %
Neutro Abs: 3.9 10*3/uL (ref 1.7–7.7)
Neutrophils Relative %: 48 %
Platelets: 464 10*3/uL — ABNORMAL HIGH (ref 150–400)
RBC: 5.24 MIL/uL — ABNORMAL HIGH (ref 3.87–5.11)
RDW: 14.2 % (ref 11.5–15.5)
WBC: 7.8 10*3/uL (ref 4.0–10.5)
nRBC: 0 % (ref 0.0–0.2)

## 2021-05-30 LAB — C-REACTIVE PROTEIN: CRP: 1.1 mg/dL — ABNORMAL HIGH (ref <1.0)

## 2021-05-30 LAB — SEDIMENTATION RATE: Sed Rate: 11 mm/hr (ref 0–22)

## 2021-05-30 NOTE — Patient Instructions (Addendum)
[place] at [facility] ?Discharge Instructions ? ?You were seen and examined today by Dr. Delton Coombes. Dr. Delton Coombes is a hematologist, meaning that he specializes in blood abnormalities. Dr. Delton Coombes discussed your past medical history, family history of cancers/blood conditions and the events that led to you being here today. ? ?You were referred to Dr. Delton Coombes due to elevated platelets. Platelets are a type of blood cell used in clotting. Your platelet counts have been elevated intermittently since 2008. This can happen following surgery, with infection or if there is an issue with your bone marrow creating too many platelets.  ? ?Dr. Delton Coombes has recommended additional lab work today in an attempt to identify the cause of your elevated platelets. ? ?Please follow-up as scheduled. ? ? ?Thank you for choosing [place] at [facility] to provide your oncology and hematology care.  To afford each patient quality time with our provider, please arrive at least 15 minutes before your scheduled appointment time.  ? ?If you have a lab appointment with the [facility] please come in thru the Main Entrance and check in at the main information desk. ? ?You need to re-schedule your appointment should you arrive 10 or more minutes late.  We strive to give you quality time with our providers, and arriving late affects you and other patients whose appointments are after yours.  Also, if you no show three or more times for appointments you may be dismissed from the clinic at the providers discretion.     ?Again, thank you for choosing [facility]  Our hope is that these requests will decrease the amount of time that you wait before being seen by our physicians.       ?_____________________________________________________________ ? ?Should you have questions after your visit to [facility], please contact our office at [phone] and follow  the prompts.  Our office hours are 8:00 a.m. and 4:30 p.m. Monday - Friday.  Please note that voicemails left after 4:00 p.m. may not be returned until the following business day.  We are closed weekends and major holidays.  You do have access to a nurse 24-7, just call the main number to the clinic [phone] and do not press any options, hold on the line and a nurse will answer the phone.   ? ?For prescription refill requests, have your pharmacy contact our office and allow 72 hours.   ? ?Due to Covid, you will need to wear a mask upon entering the hospital. If you do not have a mask, a mask will be given to you at the Main Entrance upon arrival. For doctor visits, patients may have 1 support person age 47 or older with them. For treatment visits, patients can not have anyone with them due to social distancing guidelines and our immunocompromised population.  ? ? ? ?
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
## 2021-05-30 NOTE — Telephone Encounter (Signed)
We discussed that this might happen.  May need to see GI/ have stool studies done before approval since she has combo diarrhea and constipation and Xifaxan is indicated in IBS-D ?

## 2021-05-31 LAB — ANTINUCLEAR ANTIBODIES, IFA: ANA Ab, IFA: NEGATIVE

## 2021-05-31 LAB — RHEUMATOID FACTOR: Rheumatoid fact SerPl-aCnc: <10 IU/mL (ref <14.0)

## 2021-06-03 LAB — BCR-ABL1 FISH
Cells Analyzed: 200
Cells Counted: 200

## 2021-06-06 ENCOUNTER — Other Ambulatory Visit (HOSPITAL_COMMUNITY): Payer: Self-pay | Admitting: *Deleted

## 2021-06-06 ENCOUNTER — Inpatient Hospital Stay (HOSPITAL_COMMUNITY): Payer: Medicare Other | Attending: Hematology

## 2021-06-06 DIAGNOSIS — D75839 Thrombocytosis, unspecified: Secondary | ICD-10-CM

## 2021-06-06 DIAGNOSIS — Z7982 Long term (current) use of aspirin: Secondary | ICD-10-CM | POA: Diagnosis not present

## 2021-06-10 ENCOUNTER — Encounter (HOSPITAL_COMMUNITY): Payer: Self-pay

## 2021-06-14 LAB — CALR +MPL + E12-E15  (REFLEX)

## 2021-06-14 LAB — JAK2 V617F RFX CALR/MPL/E12-15

## 2021-06-21 ENCOUNTER — Inpatient Hospital Stay (HOSPITAL_COMMUNITY): Payer: Medicare Other | Attending: Hematology | Admitting: Physician Assistant

## 2021-06-21 VITALS — BP 167/79 | HR 53 | Temp 98.6°F | Resp 17 | Ht 64.0 in | Wt 165.8 lb

## 2021-06-21 DIAGNOSIS — D75839 Thrombocytosis, unspecified: Secondary | ICD-10-CM | POA: Diagnosis not present

## 2021-06-21 NOTE — Patient Instructions (Signed)
[facility] at [facility] ?Discharge Instructions ? ?You were seen today by Tarri Abernethy PA-C for your elevated platelets. ? ?Your lab testing did not show any obvious cause of your elevated platelets.  You did have some borderline low iron levels, which could be contributing to high platelets.  Otherwise, I would suspect that your platelets may be related to some underlying inflammation. ? ?I would like you to start taking iron supplement daily.  Take this with a glass of orange juice, since it will improve your body's ability to absorb the iron.  Due to your sensitive stomach, you may want to try FERROUS BISGLYCINATE.  If you are unable to find this in your local drugstore, you could try ordering it online. ? ?Continue taking aspirin 81 mg daily. ? ?I will see you for follow-up visit and repeat labs in 4 months.  If you have any significantly elevated platelets in the future, we would consider bone marrow biopsy. ? ? - - - - - - - - - - - - - - - - - - - ? ? ?Thank you for choosing [facility] at [facility] to provide your oncology and hematology care.  To afford each patient quality time with our provider, please arrive at least 15 minutes before your scheduled appointment time.  ? ?If you have a lab appointment with the [facility] please come in thru the Main Entrance and check in at the main information desk. ? ?You need to re-schedule your appointment should you arrive 10 or more minutes late.  We strive to give you quality time with our providers, and arriving late affects you and other patients whose appointments are after yours.  Also, if you no show three or more times for appointments you may be dismissed from the clinic at the providers discretion.     ?Again, thank you for choosing [facility].  Our hope is that these requests will decrease the amount of time that you wait before being seen by our physicians.        ?_____________________________________________________________ ? ?Should you have questions after your visit to [facility], please contact our office at [phone] and follow the prompts.  Our office hours are 8:00 a.m. and 4:30 p.m. Monday - Friday.  Please note that voicemails left after 4:00 p.m. may not be returned until the following business day.  We are closed weekends and major holidays.  You do have access to a nurse 24-7, just call the main number to the clinic [phone] and do not press any options, hold on the line and a nurse will answer the phone.   ? ?For prescription refill requests, have your pharmacy contact our office and allow 72 hours.   ? ?Due to Covid, you will need to wear a mask upon entering the hospital. If you do not have a mask, a mask will be given to you at the Main Entrance upon arrival. For doctor visits, patients may have 1 support person age 73 or older with them. For treatment visits, patients can not have anyone with them due to social distancing guidelines and our immunocompromised population.  ? ? ? ?
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
## 2021-06-27 ENCOUNTER — Encounter: Payer: Self-pay | Admitting: Nurse Practitioner

## 2021-06-27 ENCOUNTER — Telehealth: Payer: Self-pay | Admitting: Family Medicine

## 2021-06-27 ENCOUNTER — Other Ambulatory Visit: Payer: Self-pay | Admitting: Nurse Practitioner

## 2021-06-27 ENCOUNTER — Ambulatory Visit (INDEPENDENT_AMBULATORY_CARE_PROVIDER_SITE_OTHER): Payer: Medicare Other | Admitting: Nurse Practitioner

## 2021-06-27 ENCOUNTER — Ambulatory Visit: Payer: Medicare Other | Admitting: Physician Assistant

## 2021-06-27 VITALS — BP 136/72 | HR 56 | Temp 97.0°F | Resp 20 | Ht 64.0 in | Wt 169.0 lb

## 2021-06-27 DIAGNOSIS — R110 Nausea: Secondary | ICD-10-CM

## 2021-06-27 DIAGNOSIS — I10 Essential (primary) hypertension: Secondary | ICD-10-CM

## 2021-06-27 DIAGNOSIS — E785 Hyperlipidemia, unspecified: Secondary | ICD-10-CM

## 2021-06-27 DIAGNOSIS — K582 Mixed irritable bowel syndrome: Secondary | ICD-10-CM | POA: Diagnosis not present

## 2021-06-27 DIAGNOSIS — I2510 Atherosclerotic heart disease of native coronary artery without angina pectoris: Secondary | ICD-10-CM

## 2021-06-27 DIAGNOSIS — K219 Gastro-esophageal reflux disease without esophagitis: Secondary | ICD-10-CM

## 2021-06-27 MED ORDER — ONDANSETRON HCL 4 MG PO TABS: 4.0000 mg | ORAL_TABLET | Freq: Three times a day (TID) | ORAL | 0 refills | Status: DC | PRN

## 2021-06-27 MED ORDER — RIFAXIMIN 200 MG PO TABS: 200.0000 mg | ORAL_TABLET | Freq: Three times a day (TID) | ORAL | 0 refills | Status: DC

## 2021-06-27 NOTE — Telephone Encounter (Signed)
Patient has no current indication for an antibiotic prescription, an alternative to Xifaxan is flagyl which may cause more constipation, Nausea, diarrhea  with abdominal Cramps. ? ?My recommendation: Probiotic, Gas X, diet modification and  keep GI appointment. ? ?If patient insist on flagyl I will send prescription to pharmacy.  ?

## 2021-06-27 NOTE — Progress Notes (Signed)
? ?Acute Office Visit ? ?Subjective:  ? ?  ?Patient ID: [name] [name], female    DOB: 10-May-1950, 71 y.o.   MRN: [id number] ? ?Chief Complaint  ?Patient presents with  ? Irritable Bowel Syndrome  ?  Getting worse - meds were going to be very $$  ? ? ?Diarrhea  ?This is a new problem. The current episode started yesterday. The problem occurs 2 to 4 times per day. The problem has been unchanged. The stool consistency is described as Watery. The patient states that diarrhea does not awaken her from sleep. Associated symptoms include abdominal pain and increased flatus. Pertinent negatives include no chills, fever, vomiting or weight loss. Her past medical history is significant for irritable bowel syndrome.  ?Constipation ?This is a recurrent problem. The current episode started more than 1 month ago. The problem is unchanged. Her stool frequency is 4 to 5 times per week. The stool is described as loose. The patient is on a high fiber diet. She Does not exercise regularly. There has Been adequate water intake. Associated symptoms include abdominal pain, diarrhea, flatus and nausea. Pertinent negatives include no back pain, fever, vomiting or weight loss. Risk factors include dietary change. Treatments tried: Yogurt, probiotics. The treatment provided mild relief. Her past medical history is significant for irritable bowel syndrome.  ? ? ?Review of Systems  ?Constitutional: Negative.  Negative for chills, fever, malaise/fatigue and weight loss.  ?Gastrointestinal:  Positive for abdominal pain, constipation, diarrhea, flatus and nausea. Negative for vomiting.  ?Musculoskeletal:  Negative for back pain.  ?Skin:  Negative for rash.  ?All other systems reviewed and are negative. ? ? ?   ?Objective:  ?  ?BP 136/72   Pulse (!) 56   Temp (!) 97 ?F (36.1 ?C)   Resp 20   Ht '5\' 4"'$  (1.626 m)   Wt 169 lb (76.7 kg)   SpO2 97%   BMI 29.01 kg/m?  ?BP Readings from Last 3 Encounters:  ?06/27/21 136/72  ?06/21/21 (!) 167/79   ?05/30/21 (!) 150/70  ? ?Wt Readings from Last 3 Encounters:  ?06/27/21 169 lb (76.7 kg)  ?06/21/21 165 lb 12.8 oz (75.2 kg)  ?05/30/21 166 lb 10.7 oz (75.6 kg)  ? ?  ? ?Physical Exam ?Vitals and nursing note reviewed.  ?Constitutional:   ?   Appearance: Normal appearance. She is obese.  ?HENT:  ?   Head: Normocephalic.  ?   Right Ear: External ear normal.  ?   Left Ear: External ear normal.  ?   Nose: Nose normal.  ?   Mouth/Throat:  ?   Mouth: Mucous membranes are moist.  ?Eyes:  ?   Conjunctiva/sclera: Conjunctivae normal.  ?Cardiovascular:  ?   Rate and Rhythm: Normal rate and regular rhythm.  ?   Pulses: Normal pulses.  ?   Heart sounds: Normal heart sounds.  ?Pulmonary:  ?   Effort: Pulmonary effort is normal.  ?   Breath sounds: Normal breath sounds.  ?Abdominal:  ?   General: Bowel sounds are normal.  ?   Tenderness: There is abdominal tenderness.  ?Skin: ?   General: Skin is warm.  ?   Findings: No rash.  ?Neurological:  ?   Mental Status: She is alert and oriented to person, place, and time.  ? ? ?No results found for any visits on 06/27/21. ? ? ?   ?Assessment & Plan:  ?Abdominal pain not well controlled.  This is not new for patient in the past  few weeks.  Xifaxan 200 mg tablet by mouth 3 times daily for 3 days. ?Tylenol for pain  ?Zofran for nausea ?Patient has an appointment scheduled for GI for further assessment. ?Follow-up with worsening unresolved symptoms. ?Problem List Items Addressed This Visit   ?None ?Visit Diagnoses   ? ? Irritable bowel syndrome with both constipation and diarrhea    -  Primary  ? Relevant Medications  ? ondansetron (ZOFRAN) 4 MG tablet  ? rifaximin (XIFAXAN) 200 MG tablet  ? Nausea      ? Relevant Medications  ? ondansetron (ZOFRAN) 4 MG tablet  ? ?  ? ? ?Meds ordered this encounter  ?Medications  ? ondansetron (ZOFRAN) 4 MG tablet  ?  Sig: Take 1 tablet (4 mg total) by mouth every 8 (eight) hours as needed for nausea or vomiting.  ?  Dispense:  20 tablet  ?  Refill:  0  ?   Order Specific Question:   Supervising Provider  ?  AnswerClaretta Fraise [[id number]]  ? rifaximin (XIFAXAN) 200 MG tablet  ?  Sig: Take 1 tablet (200 mg total) by mouth 3 (three) times daily.  ?  Dispense:  9 tablet  ?  Refill:  0  ?  Order Specific Question:   Supervising Provider  ?  AnswerClaretta Fraise [[id number]]  ? ? ?Return if symptoms worsen or fail to improve. ? ?Ivy Lynn, NP ? ? ?

## 2021-06-27 NOTE — Telephone Encounter (Signed)
Patient can find out from her insurance if they will cover Viberzi because its higher  in cost. If cost was the issue from the beginning ?

## 2021-06-27 NOTE — Telephone Encounter (Signed)
In the last OV with Dr Darnell Level she had mentioned Viberzi as a possible alternative to the xifaxin. Would this be something that could be sent in for pt? ?

## 2021-06-27 NOTE — Telephone Encounter (Signed)
Pt states the Xifaxin needs a PA so pt aware we will try to work on it tomorrow. ?

## 2021-06-27 NOTE — Patient Instructions (Signed)
Diet for Irritable Bowel Syndrome When you have irritable bowel syndrome (IBS), it is very important to follow the eating habits that are best for your condition. IBS may cause various symptoms, such as pain in the abdomen, constipation, or diarrhea. Choosing the right foods can help to ease the discomfort from these symptoms. Work with your health care provider and dietitian to find the eating plan that will help to control your symptoms. What are tips for following this plan?  Keep a food diary. This will help you identify foods that cause symptoms. Write down: What you eat and when you eat it. What symptoms you have. When symptoms occur in relation to your meals, such as "pain in abdomen 2 hours after dinner." Eat your meals slowly and in a relaxed setting. Aim to eat 5-6 small meals per day. Do not skip meals. Drink enough fluid to keep your urine pale yellow. Ask your health care provider if you should take an over-the-counter probiotic to help restore healthy bacteria in your gut (digestive tract). Probiotics are foods that contain good bacteria and yeasts. Your dietitian may have specific dietary recommendations for you based on your symptoms. Your dietitian may recommend that you: Avoid foods that cause symptoms. Talk with your dietitian about other ways to get the same nutrients that are in those problem foods. Avoid foods with gluten. Gluten is a protein that is found in rye, wheat, and barley. Eat more foods that contain soluble fiber. Examples of foods with high soluble fiber include oats, seeds, and certain fruits and vegetables. Take a fiber supplement if told by your dietitian. Reduce or avoid certain foods called FODMAPs. These are foods that contain sugars that are hard for some people to digest. Ask your health care provider which foods to avoid. What foods should I avoid? The following are some foods and drinks that may make your symptoms worse: Fatty foods, such as french  fries. Foods that contain gluten, such as pasta and cereal. Dairy products, such as milk, cheese, and ice cream. Spicy foods. Alcohol. Products with caffeine, such as coffee, tea, or chocolate. Carbonated drinks, such as soda. Foods that are high in FODMAPs. These include certain fruits and vegetables. Products with sweeteners such as honey, high fructose corn syrup, sorbitol, and mannitol. The items listed above may not be a complete list of foods and beverages you should avoid. Contact a dietitian for more information. What foods are good sources of fiber? Your health care provider or dietitian may recommend that you eat more foods that contain fiber. Fiber can help to reduce constipation and other IBS symptoms. Add foods with fiber to your diet a little at a time so your body can get used to them. Too much fiber at one time might cause gas and swelling of your abdomen. The following are some foods that are good sources of fiber: Berries, such as raspberries, strawberries, and blueberries. Tomatoes. Carrots. Brown rice. Oats. Seeds, such as chia and pumpkin seeds. The items listed above may not be a complete list of recommended sources of fiber. Contact your dietitian for more options. Where to find more information International Foundation for Functional Gastrointestinal Disorders: aboutibs.org National Institute of Diabetes and Digestive and Kidney Diseases: niddk.nih.gov Summary When you have irritable bowel syndrome (IBS), it is very important to follow the eating habits that are best for your condition. IBS may cause various symptoms, such as pain in the abdomen, constipation, or diarrhea. Choosing the right foods can help to ease the   discomfort that comes from symptoms. Your health care provider or dietitian may recommend that you eat more foods that contain fiber. Keep a food diary. This will help you identify foods that cause symptoms. This information is not intended to replace  advice given to you by your health care provider. Make sure you discuss any questions you have with your health care provider. Document Revised: 01/11/2021 Document Reviewed: 01/11/2021 Elsevier Patient Education  2023 Elsevier Inc.  

## 2021-06-29 ENCOUNTER — Other Ambulatory Visit: Payer: Self-pay | Admitting: Nurse Practitioner

## 2021-06-29 MED ORDER — VIBERZI 100 MG PO TABS: 100.0000 mg | ORAL_TABLET | Freq: Two times a day (BID) | ORAL | 0 refills | Status: DC

## 2021-06-29 NOTE — Telephone Encounter (Signed)
Happy that symptoms are well controlled ?

## 2021-06-29 NOTE — Telephone Encounter (Signed)
PA for Xifaxin was previously denied and they will not allow another attempt to the PA (even though dose is different)  ? ?We may have to choose another med for this pt  ? ? ? ?

## 2021-06-29 NOTE — Telephone Encounter (Signed)
I sent Viberzi to her pharmacy. ? ?Have patient follow up with PCP for chronic disease management ?

## 2021-06-29 NOTE — Telephone Encounter (Signed)
Spoke to patient she states that she told pharmacy to cancel RX - she is "done with all of this", she states that her stomach symptoms have improved and she is happy with that.  Patient has a GI appt on 07/22/2021 per chart.  ?

## 2021-07-01 ENCOUNTER — Encounter (HOSPITAL_COMMUNITY): Payer: Medicare Other | Admitting: Hematology

## 2021-07-01 ENCOUNTER — Encounter: Payer: Self-pay | Admitting: Physician Assistant

## 2021-07-05 ENCOUNTER — Ambulatory Visit: Payer: Medicare Other | Admitting: Internal Medicine

## 2021-07-16 ENCOUNTER — Encounter: Payer: Self-pay | Admitting: Internal Medicine

## 2021-07-20 ENCOUNTER — Encounter: Payer: Self-pay | Admitting: Cardiovascular Disease

## 2021-07-22 ENCOUNTER — Ambulatory Visit: Payer: Medicare Other | Admitting: Internal Medicine

## 2021-07-22 ENCOUNTER — Encounter: Payer: Self-pay | Admitting: Internal Medicine

## 2021-07-22 VITALS — BP 146/74 | HR 55 | Ht 64.0 in | Wt 168.0 lb

## 2021-07-22 DIAGNOSIS — K5900 Constipation, unspecified: Secondary | ICD-10-CM | POA: Diagnosis not present

## 2021-07-22 DIAGNOSIS — R1011 Right upper quadrant pain: Secondary | ICD-10-CM

## 2021-07-22 DIAGNOSIS — K219 Gastro-esophageal reflux disease without esophagitis: Secondary | ICD-10-CM | POA: Diagnosis not present

## 2021-07-22 IMAGING — DX DG CHEST 1V
1 series · 1 of 1 positions shown · non-contrast
Comparison: Radiograph 11/15/2017

CLINICAL DATA: Chest pain, shortness of breath, bilateral leg
swelling

EXAM:
CHEST  1 VIEW

[chest ap]
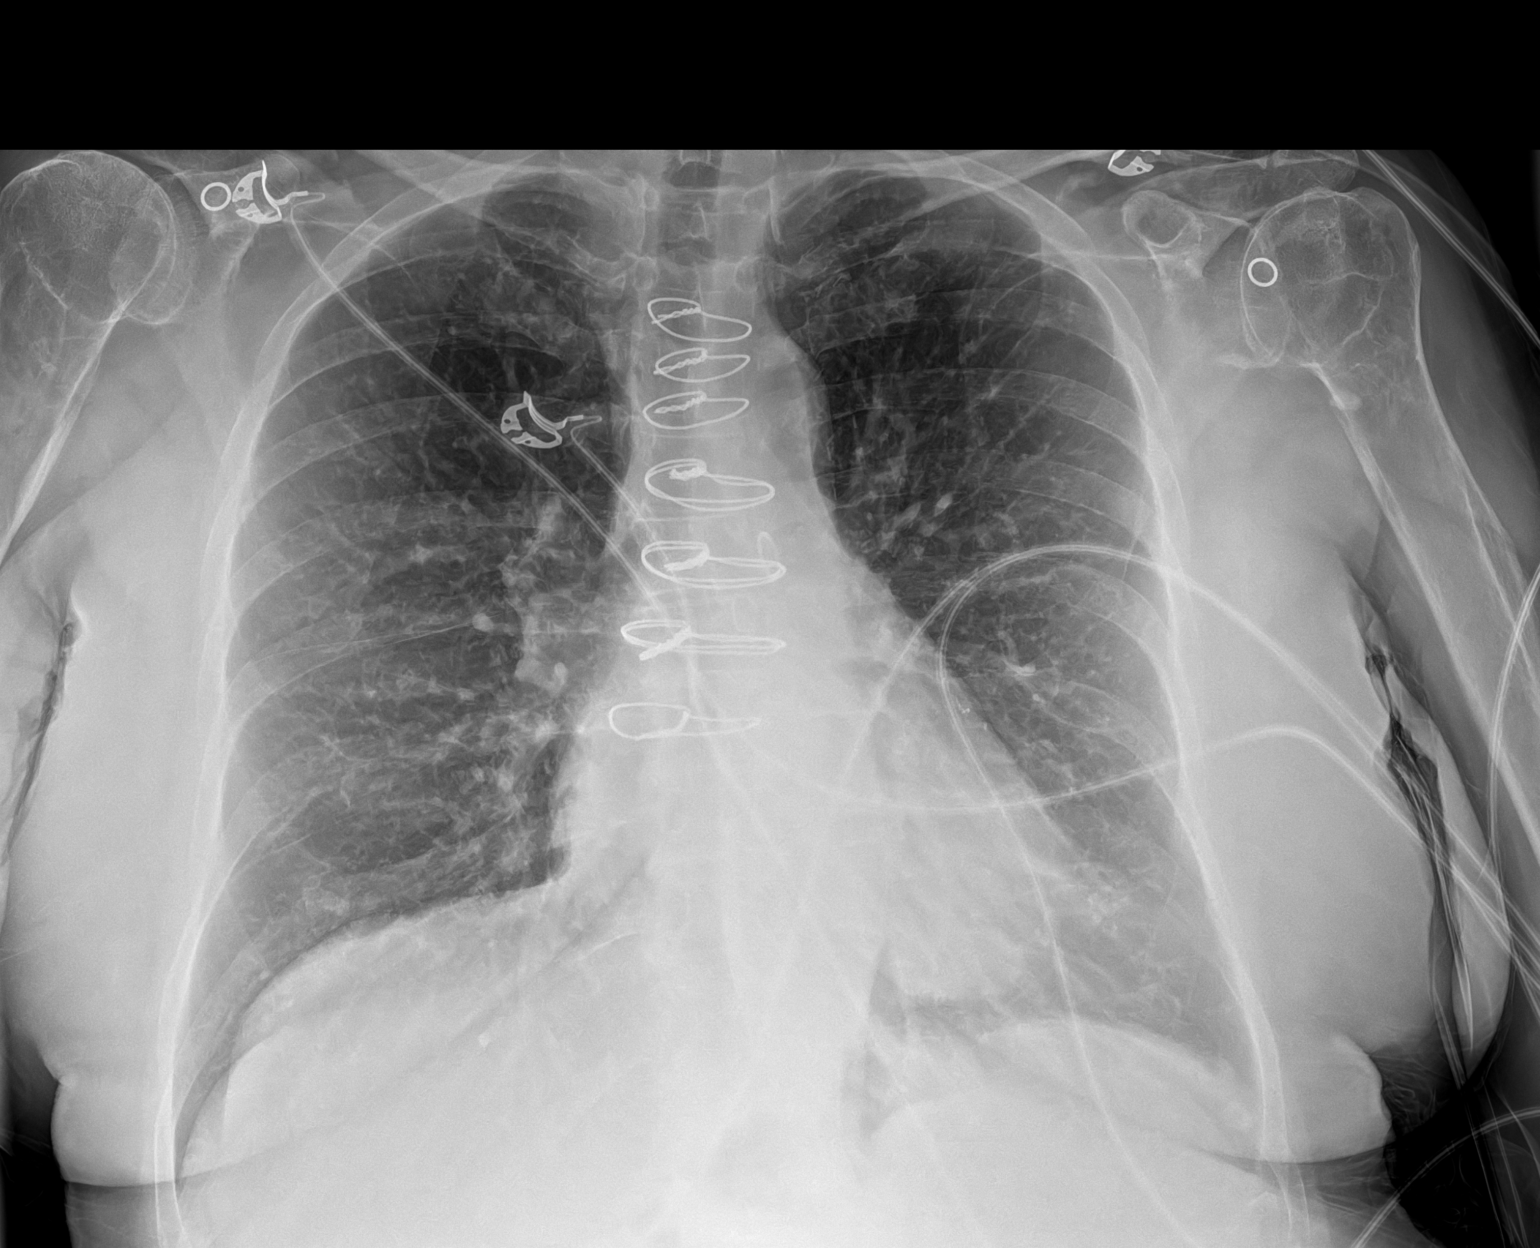

[1 of 1 positions shown; findings below may reference images not displayed]

FINDINGS: Low lung volumes with streaky atelectatic changes. No focal
consolidation, pneumothorax or effusion. Coarse reticular opacities
are seen throughout the lungs, similar to comparison. Mild central
vascular congestion. Postsurgical changes related to prior CABG
including intact and aligned sternotomy wires and multiple surgical
clips projecting over the mediastinum. No acute osseous or soft
tissue abnormality. Degenerative changes are present in the imaged
spine and shoulders. Telemetry leads overlie the chest.
IMPRESSION: 1. Low lung volumes with streaky atelectatic changes.
2. Central vascular congestion.
3. Chronically coarsened interstitium.
4. Prior CABG.
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
## 2021-07-22 NOTE — Patient Instructions (Signed)
If you are age 70 or older, your body mass index should be between 23-30. Your Body mass index is 28.84 kg/m. If this is out of the aforementioned range listed, please consider follow up with your Primary Care Provider.  If you are age 88 or younger, your body mass index should be between 19-25. Your Body mass index is 28.84 kg/m. If this is out of the aformentioned range listed, please consider follow up with your Primary Care Provider.   ________________________________________________________  The [place] [facility] providers would like to encourage you to use [facility] to communicate with providers for non-urgent requests or questions.  Due to long hold times on the telephone, sending your provider a message by [facility] may be a faster and more efficient way to get a response.  Please allow 48 business hours for a response.  Please remember that this is for non-urgent requests.  _______________________________________________________  Take over the counter Colace - 1 -2 doses daily

## 2021-09-01 ENCOUNTER — Other Ambulatory Visit: Payer: Self-pay | Admitting: Cardiovascular Disease

## 2021-10-21 ENCOUNTER — Other Ambulatory Visit (HOSPITAL_COMMUNITY): Payer: Medicare Other

## 2021-10-26 ENCOUNTER — Encounter: Payer: Self-pay | Admitting: Cardiovascular Disease

## 2021-10-28 ENCOUNTER — Ambulatory Visit (HOSPITAL_COMMUNITY): Payer: Medicare Other | Admitting: Physician Assistant

## 2021-11-02 ENCOUNTER — Ambulatory Visit: Payer: Medicare Other | Admitting: Physician Assistant

## 2021-11-14 ENCOUNTER — Other Ambulatory Visit: Payer: Self-pay | Admitting: *Deleted

## 2021-11-14 DIAGNOSIS — D75839 Thrombocytosis, unspecified: Secondary | ICD-10-CM

## 2021-11-14 NOTE — Progress Notes (Addendum)
Per Dr. Delton Coombes, order placed for BMBX to be done at [facility]. As the platelets were still elevated after 4 months of iron supplements, want to rule out myeloproliferative neoplasm. Platelets in September were 473 (after taking iron for 5 months), slightly up from 469 in April.

## 2021-11-16 ENCOUNTER — Other Ambulatory Visit: Payer: Self-pay | Admitting: Family Medicine

## 2021-11-16 ENCOUNTER — Other Ambulatory Visit: Payer: Self-pay | Admitting: Sports Medicine

## 2021-11-16 DIAGNOSIS — Z1231 Encounter for screening mammogram for malignant neoplasm of breast: Secondary | ICD-10-CM

## 2021-12-02 ENCOUNTER — Other Ambulatory Visit: Payer: Self-pay | Admitting: Student

## 2021-12-02 DIAGNOSIS — D75839 Thrombocytosis, unspecified: Secondary | ICD-10-CM

## 2021-12-05 ENCOUNTER — Ambulatory Visit (HOSPITAL_COMMUNITY)
Admission: RE | Admit: 2021-12-05 | Discharge: 2021-12-05 | Disposition: A | Discharge status: Home / Self Care | Payer: Medicare Other | Source: Ambulatory Visit | Attending: Hematology | Admitting: Hematology

## 2021-12-05 ENCOUNTER — Encounter (HOSPITAL_COMMUNITY): Payer: Self-pay | Admitting: *Deleted

## 2021-12-05 DIAGNOSIS — I2510 Atherosclerotic heart disease of native coronary artery without angina pectoris: Secondary | ICD-10-CM | POA: Insufficient documentation

## 2021-12-05 DIAGNOSIS — F419 Anxiety disorder, unspecified: Secondary | ICD-10-CM | POA: Insufficient documentation

## 2021-12-05 DIAGNOSIS — G4730 Sleep apnea, unspecified: Secondary | ICD-10-CM | POA: Diagnosis not present

## 2021-12-05 DIAGNOSIS — Z951 Presence of aortocoronary bypass graft: Secondary | ICD-10-CM | POA: Diagnosis not present

## 2021-12-05 DIAGNOSIS — Z1379 Encounter for other screening for genetic and chromosomal anomalies: Secondary | ICD-10-CM | POA: Diagnosis not present

## 2021-12-05 DIAGNOSIS — F32A Depression, unspecified: Secondary | ICD-10-CM | POA: Diagnosis not present

## 2021-12-05 DIAGNOSIS — K589 Irritable bowel syndrome without diarrhea: Secondary | ICD-10-CM | POA: Diagnosis not present

## 2021-12-05 DIAGNOSIS — I10 Essential (primary) hypertension: Secondary | ICD-10-CM | POA: Insufficient documentation

## 2021-12-05 DIAGNOSIS — K219 Gastro-esophageal reflux disease without esophagitis: Secondary | ICD-10-CM | POA: Insufficient documentation

## 2021-12-05 DIAGNOSIS — D75839 Thrombocytosis, unspecified: Secondary | ICD-10-CM | POA: Insufficient documentation

## 2021-12-05 LAB — CBC WITH DIFFERENTIAL/PLATELET
Abs Immature Granulocytes: 0.06 10*3/uL (ref 0.00–0.07)
Basophils Absolute: 0.1 10*3/uL (ref 0.0–0.1)
Basophils Relative: 1 %
Eosinophils Absolute: 0.2 10*3/uL (ref 0.0–0.5)
Eosinophils Relative: 3 %
HCT: 42.7 % (ref 36.0–46.0)
Hemoglobin: 13.9 g/dL (ref 12.0–15.0)
Immature Granulocytes: 1 %
Lymphocytes Relative: 36 %
Lymphs Abs: 2.9 10*3/uL (ref 0.7–4.0)
MCH: 28.1 pg (ref 26.0–34.0)
MCHC: 32.6 g/dL (ref 30.0–36.0)
MCV: 86.3 fL (ref 80.0–100.0)
Monocytes Absolute: 0.8 10*3/uL (ref 0.1–1.0)
Monocytes Relative: 10 %
Neutro Abs: 4 10*3/uL (ref 1.7–7.7)
Neutrophils Relative %: 49 %
Platelets: 401 10*3/uL — ABNORMAL HIGH (ref 150–400)
RBC: 4.95 MIL/uL (ref 3.87–5.11)
RDW: 13.7 % (ref 11.5–15.5)
WBC: 8 10*3/uL (ref 4.0–10.5)
nRBC: 0 % (ref 0.0–0.2)

## 2021-12-05 MED ORDER — MIDAZOLAM HCL 2 MG/2ML IJ SOLN
INTRAMUSCULAR | Status: AC | PRN
  Administered 2021-12-05 (×2): .5 mg via INTRAVENOUS
  Administered 2021-12-05: 1 mg via INTRAVENOUS

## 2021-12-05 MED ORDER — MIDAZOLAM HCL 2 MG/2ML IJ SOLN
INTRAMUSCULAR | Status: AC
  Filled 2021-12-05: qty 4

## 2021-12-05 MED ORDER — LIDOCAINE HCL (PF) 1 % IJ SOLN
INTRAMUSCULAR | Status: AC | PRN
  Administered 2021-12-05: 20 mL

## 2021-12-05 MED ORDER — FENTANYL CITRATE (PF) 100 MCG/2ML IJ SOLN
INTRAMUSCULAR | Status: AC | PRN
  Administered 2021-12-05 (×2): 50 ug via INTRAVENOUS

## 2021-12-05 MED ORDER — SODIUM CHLORIDE 0.9 % IV SOLN: INTRAVENOUS | Status: DC

## 2021-12-05 MED ORDER — FENTANYL CITRATE (PF) 100 MCG/2ML IJ SOLN
INTRAMUSCULAR | Status: AC
  Filled 2021-12-05: qty 2
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
## 2021-12-05 NOTE — Procedures (Signed)
Vascular and Interventional Radiology Procedure Note  Patient: [name] [name] DOB: February 26, 1950 Medical Record Number: [id number] Note Date/Time: 12/05/21 8:47 AM   Performing Physician: Michaelle Birks, MD Assistant(s): None  Diagnosis: Thrombocytosis  Procedure: BONE MARROW ASPIRATION and BIOPSY  Anesthesia: Conscious Sedation Complications: None Estimated Blood Loss: Minimal Specimens: Sent for Pathology  Findings:  Successful CT-guided bone marrow aspiration and biopsy A total of 1 cores were obtained. Hemostasis of the tract was achieved using Manual Pressure.  Plan: Bed rest for 1 hours.  See detailed procedure note with images in PACS. The patient tolerated the procedure well without incident or complication and was returned to Recovery in stable condition.    Michaelle Birks, MD [facility] Radiology   Pager. [place]

## 2021-12-07 LAB — SURGICAL PATHOLOGY

## 2021-12-12 ENCOUNTER — Inpatient Hospital Stay: Payer: Medicare Other | Attending: Hematology

## 2021-12-12 DIAGNOSIS — D75839 Thrombocytosis, unspecified: Secondary | ICD-10-CM | POA: Insufficient documentation

## 2021-12-12 DIAGNOSIS — E611 Iron deficiency: Secondary | ICD-10-CM | POA: Diagnosis not present

## 2021-12-12 LAB — CBC WITH DIFFERENTIAL/PLATELET
Abs Immature Granulocytes: 0.03 10*3/uL (ref 0.00–0.07)
Basophils Absolute: 0.1 10*3/uL (ref 0.0–0.1)
Basophils Relative: 1 %
Eosinophils Absolute: 0.2 10*3/uL (ref 0.0–0.5)
Eosinophils Relative: 3 %
HCT: 42.6 % (ref 36.0–46.0)
Hemoglobin: 14 g/dL (ref 12.0–15.0)
Immature Granulocytes: 0 %
Lymphocytes Relative: 40 %
Lymphs Abs: 3 10*3/uL (ref 0.7–4.0)
MCH: 27.9 pg (ref 26.0–34.0)
MCHC: 32.9 g/dL (ref 30.0–36.0)
MCV: 85 fL (ref 80.0–100.0)
Monocytes Absolute: 0.6 10*3/uL (ref 0.1–1.0)
Monocytes Relative: 8 %
Neutro Abs: 3.7 10*3/uL (ref 1.7–7.7)
Neutrophils Relative %: 48 %
Platelets: 390 10*3/uL (ref 150–400)
RBC: 5.01 MIL/uL (ref 3.87–5.11)
RDW: 13.8 % (ref 11.5–15.5)
WBC: 7.5 10*3/uL (ref 4.0–10.5)
nRBC: 0 % (ref 0.0–0.2)

## 2021-12-12 LAB — IRON AND TIBC
Iron: 81 ug/dL (ref 28–170)
Saturation Ratios: 25 % (ref 10.4–31.8)
TIBC: 330 ug/dL (ref 250–450)
UIBC: 249 ug/dL

## 2021-12-12 LAB — FERRITIN: Ferritin: 61 ng/mL (ref 11–307)

## 2021-12-13 ENCOUNTER — Encounter (HOSPITAL_COMMUNITY): Payer: Self-pay | Admitting: Hematology
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
## 2021-12-14 ENCOUNTER — Inpatient Hospital Stay: Payer: Medicare Other | Attending: Physician Assistant | Admitting: Physician Assistant

## 2021-12-14 VITALS — BP 143/76 | HR 53 | Temp 98.3°F | Resp 17 | Ht 64.0 in | Wt 167.2 lb

## 2021-12-14 DIAGNOSIS — D75839 Thrombocytosis, unspecified: Secondary | ICD-10-CM | POA: Diagnosis not present

## 2021-12-14 DIAGNOSIS — Z8719 Personal history of other diseases of the digestive system: Secondary | ICD-10-CM | POA: Insufficient documentation

## 2021-12-14 DIAGNOSIS — E611 Iron deficiency: Secondary | ICD-10-CM | POA: Diagnosis not present

## 2021-12-14 NOTE — Patient Instructions (Signed)
[name] [name] at [facility] **   You were seen today by Tarri Abernethy PA-C for your elevated platelets and low iron levels.    ELEVATED PLATELETS Your bone marrow biopsy did not show any signs of bone marrow or blood cancer. It did show that your iron levels are low. This leads [place] to believe that your platelets are elevated due to your low iron levels.  This is called "reactive thrombocytosis."  It is your body's normal response (elevated platelets) in response to abnormally low iron.  LOW IRON Your iron levels are lower than they should be. They are slowly improving since you started taking the [facility].  Continue to take the Geritol daily in the morning (make sure it is taking at a different time of day than your acid blocking medications).  LABS: Return in 6 months for repeat labs  FOLLOW-UP APPOINTMENT: Office visit in 6 months, after labs  ** Thank you for trusting me with your healthcare!  I strive to provide all of my patients with quality care at each visit.  If you receive a survey for this visit, I would be so grateful to you for taking the time to provide feedback.  Thank you in advance!  ~ Lashaun Krapf                   Dr. Derek Jack   &   Tarri Abernethy, PA-C   - - - - - - - - - - - - - - - - - -    Thank you for choosing [place] at [facility] to provide your oncology and hematology care.  To afford each patient quality time with our provider, please arrive at least 15 minutes before your scheduled appointment time.   If you have a lab appointment with the [facility] please come in thru the Main Entrance and check in at the main information desk.  You need to re-schedule your appointment should you arrive 10 or more minutes late.  We strive to give you quality time with our providers, and arriving late affects you and other patients whose appointments are after yours.   Also, if you no show three or more times for appointments you may be dismissed from the clinic at the providers discretion.     Again, thank you for choosing [facility]  Our hope is that these requests will decrease the amount of time that you wait before being seen by our physicians.       _____________________________________________________________  Should you have questions after your visit to [facility], please contact our office at [phone] and follow the prompts.  Our office hours are 8:00 a.m. and 4:30 p.m. Monday - Friday.  Please note that voicemails left after 4:00 p.m. may not be returned until the following business day.  We are closed weekends and major holidays.  You do have access to a nurse 24-7, just call the main number to the clinic [phone] and do not press any options, hold on the line and a nurse will answer the phone.    For prescription refill requests, have your pharmacy contact our office and allow 72 hours.

## 2021-12-22 ENCOUNTER — Other Ambulatory Visit: Payer: Self-pay | Admitting: Obstetrics and Gynecology

## 2021-12-22 DIAGNOSIS — Z1231 Encounter for screening mammogram for malignant neoplasm of breast: Secondary | ICD-10-CM

## 2021-12-26 ENCOUNTER — Encounter: Payer: Self-pay | Admitting: Nurse Practitioner

## 2021-12-26 ENCOUNTER — Ambulatory Visit: Payer: Medicare Other | Admitting: Nurse Practitioner

## 2021-12-26 VITALS — BP 90/54 | HR 64 | Ht 64.0 in | Wt 167.5 lb

## 2021-12-26 DIAGNOSIS — K645 Perianal venous thrombosis: Secondary | ICD-10-CM | POA: Diagnosis not present

## 2021-12-26 DIAGNOSIS — K648 Other hemorrhoids: Secondary | ICD-10-CM

## 2021-12-26 DIAGNOSIS — Z86010 Personal history of colonic polyps: Secondary | ICD-10-CM

## 2021-12-26 MED ORDER — HYDROCORTISONE ACETATE 25 MG RE SUPP: 25.0000 mg | Freq: Every day | RECTAL | 0 refills | Status: DC

## 2021-12-26 NOTE — Patient Instructions (Addendum)
We have sent the following medications to your pharmacy for you to pick up at your convenience: Anusol HC suppository   Apply a small amount of Desitin inside the anal opening and to the external anal area tid as needed for anal or hemorrhoidal irritation/bleeding.   Try stool softeners as needed & Miralax every night as needed.   Dulcolax- as needed   Contact Colleen, NP in 2 weeks with an update.   It was a pleasure to see you today!  Thank you for trusting me with your gastrointestinal care!

## 2021-12-26 NOTE — Progress Notes (Unsigned)
12/26/2021 [name] [name] [id number] 1951/01/14   Chief Complaint:  History of Present Illness: [name] [name]. [name] [name] is a 71 year old female with a past medial history of anxiety, depression, hypertension, coronary artery disease s/p 2 vessel CABG 05/2011, thrombocytosis, GERD and colon polyps. She was last seen in office by Dr. Henrene Pastor 07/22/2021  She complains of having a hemorrhoids bulging, initially was painful. She used Preparation H x 7 days and pain resolved. She initially saw a little red blood on the toilet tissue.   She typically passes small hard stools without straining. She is taking a stool softener once weekly. If she takes a stool softener daily stool becomes pasty and difficult to wipe.   Metamucil or other fiber supplements cause gas.   She takes Dulcolax 1/2 tab once weekly.   S/P bone marrow biopsy 12/05/2021: BONE MARROW, ASPIRATE, CLOT, CORE:  -  Mildly hypocellular bone marrow with otherwise orderly trilineage  hematopoiesis, see note   PERIPHERAL BLOOD:  -  Borderline thrombocytosis      Latest Ref Rng & Units 12/12/2021    8:20 AM 12/05/2021    7:45 AM 05/30/2021    2:19 PM  CBC  WBC 4.0 - 10.5 K/uL 7.5  8.0  7.8   Hemoglobin 12.0 - 15.0 g/dL 14.0  13.9  14.8   Hematocrit 36.0 - 46.0 % 42.6  42.7  45.4   Platelets 150 - 400 K/uL 390  401  464    Iron 81     Latest Ref Rng & Units 05/25/2021   11:11 AM 11/16/2020    2:57 PM 11/08/2020   10:19 AM  CMP  Glucose 70 - 99 mg/dL 99  89  81   BUN 8 - 27 mg/dL _0 Creatinine 0.57 - 1.00 mg/dL 0.76  0.98  0.69   Sodium 134 - 144 mmol/L 140  142  140   Potassium 3.5 - 5.2 mmol/L 4.8  4.8  4.8   Chloride 96 - 106 mmol/L 104  102  102   CO2 20 - 29 mmol/L _1 Calcium 8.7 - 10.3 mg/dL 9.8  9.6  9.6   Total Protein 6.0 - 8.5 g/dL 5.9     Total Bilirubin 0.0 - 1.2 mg/dL 0.3     Alkaline Phos 44 - 121 IU/L 92     AST 0 - 40 IU/L 19     ALT 0 - 32 IU/L 19       EGD 02/11/2018: 1. Essentially  normal EGD 2. GERD.  Colonoscopy 02/11/2018: - Two 1 to 3 mm polyps in the ascending colon, removed with a cold snare. Resected and retrieved. - Diverticulosis in the sigmoid colon and in the right colon. - The examination was otherwise normal on direct and retroflexion views. - 5 year colonoscopy recall  - TUBULAR ADENOMA(S) - NEGATIVE FOR HIGH-GRADE DYSPLASIA OR MALIGNANCY  Colonoscopy 6/242016: 1.One semi-pedunculated 15 mm polyps was found in the sigmoid colon; polypectomy was performed using snare cautery; 2.two 4 mm sessil;e polyps were removed from sigmoid colon, polypectomy was performed with cold forceps 3. mild diverticulosis of the sigmoid colon - TUBULOVILLOUS ADENOMA. - SEPARATE FRAGMENTS OF BENIGN POLYPOID COLORECTAL MUCOSA. - HIGH GRADE DYSPLASIA IS NOT IDENTIFIED.    Current Medications, Allergies, Past Medical History, Past Surgical History, Family History and Social History were reviewed in [employer] record.   Review of Systems:  Constitutional: Negative for fever, sweats, chills or weight loss.  Respiratory: Negative for shortness of breath.   Cardiovascular: Negative for chest pain, palpitations and leg swelling.  Gastrointestinal: See HPI.  Musculoskeletal: Negative for back pain or muscle aches.  Neurological: Negative for dizziness, headaches or paresthesias.    Physical Exam: There were no vitals taken for this visit. General: Well developed, w   ***female in no acute distress. Head: Normocephalic and atraumatic. Eyes: No scleral icterus. Conjunctiva pink . Ears: Normal auditory acuity. Mouth: Dentition intact. No ulcers or lesions.  Lungs: Clear throughout to auscultation. Heart: Regular rate and rhythm, no murmur. Abdomen: Soft, nontender and nondistended. No masses or hepatomegaly. Normal bowel sounds x 4 quadrants.  Rectal: *** Musculoskeletal: Symmetrical with no gross deformities. Extremities: No edema. Neurological:  Alert oriented x 4. No focal deficits.  Psychological: Alert and cooperative. Normal mood and affect  Assessment and Recommendations: ***

## 2021-12-27 NOTE — Progress Notes (Signed)
Reviewed. This problem is clearly hemorrhoidal related, keep planned follow-up colonoscopy date of December 2024

## 2021-12-28 ENCOUNTER — Ambulatory Visit
Admission: RE | Admit: 2021-12-28 | Discharge: 2021-12-28 | Disposition: A | Discharge status: Home / Self Care | Payer: Medicare Other | Source: Ambulatory Visit | Attending: Sports Medicine | Admitting: Sports Medicine

## 2021-12-28 DIAGNOSIS — Z1231 Encounter for screening mammogram for malignant neoplasm of breast: Secondary | ICD-10-CM

## 2022-01-06 ENCOUNTER — Other Ambulatory Visit: Payer: Self-pay | Admitting: Family Medicine

## 2022-01-06 DIAGNOSIS — F5101 Primary insomnia: Secondary | ICD-10-CM

## 2022-01-23 DIAGNOSIS — R7303 Prediabetes: Secondary | ICD-10-CM | POA: Diagnosis not present

## 2022-01-23 DIAGNOSIS — Z713 Dietary counseling and surveillance: Secondary | ICD-10-CM | POA: Diagnosis not present

## 2022-01-23 DIAGNOSIS — Z7182 Exercise counseling: Secondary | ICD-10-CM | POA: Diagnosis not present

## 2022-01-23 DIAGNOSIS — N390 Urinary tract infection, site not specified: Secondary | ICD-10-CM | POA: Diagnosis not present

## 2022-01-23 DIAGNOSIS — R300 Dysuria: Secondary | ICD-10-CM | POA: Diagnosis not present

## 2022-02-01 ENCOUNTER — Other Ambulatory Visit: Payer: Self-pay | Admitting: Family Medicine

## 2022-02-01 DIAGNOSIS — Z1231 Encounter for screening mammogram for malignant neoplasm of breast: Secondary | ICD-10-CM

## 2022-02-02 DIAGNOSIS — R8290 Unspecified abnormal findings in urine: Secondary | ICD-10-CM | POA: Diagnosis not present

## 2022-02-10 ENCOUNTER — Encounter: Payer: Self-pay | Admitting: Family Medicine

## 2022-02-20 ENCOUNTER — Other Ambulatory Visit: Payer: Self-pay | Admitting: Cardiovascular Disease

## 2022-02-20 ENCOUNTER — Other Ambulatory Visit: Payer: Self-pay | Admitting: Family Medicine

## 2022-02-20 ENCOUNTER — Other Ambulatory Visit: Payer: Self-pay | Admitting: Physician Assistant

## 2022-02-20 DIAGNOSIS — F5101 Primary insomnia: Secondary | ICD-10-CM

## 2022-03-02 DIAGNOSIS — E559 Vitamin D deficiency, unspecified: Secondary | ICD-10-CM | POA: Diagnosis not present

## 2022-03-02 DIAGNOSIS — E7849 Other hyperlipidemia: Secondary | ICD-10-CM | POA: Diagnosis not present

## 2022-03-02 DIAGNOSIS — K219 Gastro-esophageal reflux disease without esophagitis: Secondary | ICD-10-CM | POA: Diagnosis not present

## 2022-03-02 DIAGNOSIS — I25119 Atherosclerotic heart disease of native coronary artery with unspecified angina pectoris: Secondary | ICD-10-CM | POA: Diagnosis not present

## 2022-03-02 DIAGNOSIS — I10 Essential (primary) hypertension: Secondary | ICD-10-CM | POA: Diagnosis not present

## 2022-03-20 ENCOUNTER — Ambulatory Visit: Payer: Medicare Other | Admitting: Physician Assistant

## 2022-03-21 ENCOUNTER — Encounter: Payer: Self-pay | Admitting: Cardiovascular Disease

## 2022-03-21 ENCOUNTER — Ambulatory Visit: Payer: Medicare Other | Attending: Cardiovascular Disease | Admitting: Cardiovascular Disease

## 2022-03-21 VITALS — BP 126/62 | HR 57 | Ht 65.0 in | Wt 168.4 lb

## 2022-03-21 DIAGNOSIS — E785 Hyperlipidemia, unspecified: Secondary | ICD-10-CM

## 2022-03-21 DIAGNOSIS — G8929 Other chronic pain: Secondary | ICD-10-CM

## 2022-03-21 DIAGNOSIS — M25512 Pain in left shoulder: Secondary | ICD-10-CM

## 2022-03-21 DIAGNOSIS — I10 Essential (primary) hypertension: Secondary | ICD-10-CM

## 2022-03-21 DIAGNOSIS — G4730 Sleep apnea, unspecified: Secondary | ICD-10-CM

## 2022-03-21 DIAGNOSIS — I2510 Atherosclerotic heart disease of native coronary artery without angina pectoris: Secondary | ICD-10-CM | POA: Diagnosis not present

## 2022-03-21 DIAGNOSIS — Z951 Presence of aortocoronary bypass graft: Secondary | ICD-10-CM

## 2022-03-21 NOTE — Patient Instructions (Signed)
Medication Instructions:  The current medical regimen is effective;  continue present plan and medications.  *If you need a refill on your cardiac medications before your next appointment, please call your pharmacy*   Follow-Up: At [facility], you and your health needs are our priority.  As part of our continuing mission to provide you with exceptional heart care, we have created designated Provider Care Teams.  These Care Teams include your primary Cardiologist (physician) and Advanced Practice Providers (APPs -  Physician Assistants and Nurse Practitioners) who all work together to provide you with the care you need, when you need it.  We recommend signing up for the patient portal called "MyChart".  Sign up information is provided on this After Visit Summary.  MyChart is used to connect with patients for Virtual Visits (Telemedicine).  Patients are able to view lab/test results, encounter notes, upcoming appointments, etc.  Non-urgent messages can be sent to your provider as well.   To learn more about what you can do with MyChart, go to [email or web address].    Your next appointment:   12 month(s)  Provider:   Thomas Kelly, MD      

## 2022-03-21 NOTE — Progress Notes (Unsigned)
Patient ID: [name] [name], female   DOB: May 02, 1950, 72 y.o.   MRN: [id number]       HPI: [name] [name] is a 72 y.o. female who presents to the office today for a 15  month follow up cardiology evaluation.  [name] [name] has CAD and underwent cardiac catheterization on 05/22/2011 which revealed 70% distal left main stenosis, 60% ostial circumflex stenosis and mild RCA disease.  She underwent CABG surgery x2 by Dr. Nils Pyle and had a LIMA placed to her LAD, and vein to the OM 2 vessel.  Ejection fraction was 50-55%.  In October 2014, she  noticed some vague chest fullness and underwent a nuclear perfusion study which demonstrated hyperdynamic LV with an EF of 83% post stress.  She did not have ECG changes; she had normal perfusion and function.  When I saw her in October 2016  she has been without chest pain.  She has a history of vitamin D insufficiency in the past which had improved.  She has issues with muscle spasm for which and was taking Flexeril on an as-needed basis.  She has GERD for which he takes Protonix on an as-needed basis.  She has been maintained on Lipitor 10 mg for hyperlipidemia.  She had issues with diarrhea.  At that time had stopped her losartan.   She underwent an echo Doppler study in October 2016 which showed an EF of 65-70% with grade 1 diastolic dysfunction.  There was mild TR, and otherwise no significant valvular pathology.  She is unaware of any palpitations.  She denies PND, orthopnea.  She had blood work one month ago by her primary physician and her TSH was 1.5.  BUN and creatinine were 15 and 0.73, respectively.  LFTs were normal, total cholesterol 174 with triglycerides 115, HDL 68, LDL 83.    When I saw her in October 2018 she was having issues with insomnia and was concerned that this could be secondary to losartan.  She was using Benadryl to help her sleep.  Typically she has been going to bed at 9:30, but typically may stay awake and total 1 AM.  If she goes to bed at 1  AM she falls asleep much more quickly.  She has noticed poor sleep, frequent awakenings, daytime fatigue, and snoring.  She has been sleeping with the head of the bed elevated.  She has an extreme overbite.  During that evaluation, I recommended a trial of zolpidem and to help improve sleep initiation and recommend she undergo a sleep study.  I had not seen her since October 2018 and subsequently saw her in February 2021. In the interim she had  been evaluated [name], PA-C, [name], NP and recently by Dr. Domenic Polite.  In the past, her losartan was discontinued due to complaints of swelling in her throat.  She was started on hydralazine but ultimately developed issues with hydralazine leading to its discontinuance.  More recently she was started on spironolactone and was concerned perhaps that this may be contributing to swelling.  She ultimately underwent a home sleep study in January 2021 which demonstrated mild overall sleep apnea with an AHI of 5.3/h.  Oxygen nadir was 88%.  Because of concerns of her blood pressure medication I saw her on March 20, 2019 for follow-up evaluation with me and has requested that I continue to follow her cardiologically.  During that evaluation, her blood pressure was elevated and due to intolerances to numerous medications I added doxazosin initially  1 mg at bedtime for 2 weeks with potential titration to 2 mg.  I also discussed options of CPAP therapy versus oral appliance.  She wished to pursue a customized oral appliance as initial treatment and I referred her to Dr. Ron Parker for evaluation.  I recommended aggressive lipid therapy with target LDL less than 70.  I saw her in June 2021 and over the prior several months her leg swelling had improved but she had developed pain in the back of her calf particularly on the right with cramping. She denied any shortness of breath.  She denied palpitations or chest pain.  Subsequent to that evaluation she had seen Dr. Scarlette Shorts  of GI for GERD and dyspepsia.  I saw in November 25, 2019.  At that time, she denied any chest pain.  She never followed up for oral appliance evaluation.  She does not believe she is sleeping exceptionally well.  She continues to be on atorvastatin for hyperlipidemia, pantoprazole for GERD and is on spironolactone for blood pressure and swelling.  She now sees Dr. Jacalyn Lefevre at [facility] for primary care.    I last saw [name] [name] on December 22, 2020.  At that time she was admitting to experiencing vague chest pressure occurring intermittently.  She was able to walk.  She had issues with a UTI and required antibiotic therapy most recently with Augmentin.    She has been sleeping with her head propped up.  She admits to daytime fatigue.  She underwent a echo Doppler study on November 30, 2020 which showed an EF of 60 to 65% with grade 1 diastolic dysfunction.  Laboratory in May 2022 had shown an LDL cholesterol at 83 and with her known CAD I recommended further titration of atorvastatin from 20 mg to 40 mg with target LDL less than 70.  She continues to be on chlorthalidone 12.5 mg and spironolactone 25 mg twice a day.  With her occasional chest pressure and known CAD 9 years status post CABG revascularization I recommended she undergo a [place] study for further evaluation and also discussed the possibility of reassessment of her sleep apnea since she continues to have issues with sleep.  She underwent a Lexiscan Myoview study on December 28, 2020 which was normal.  Presently, she states she has had some issues with blood pressure lability and her primary care provider had recently started losartan.  Apparently she is no longer seeing Dr. Ennis Forts and is now seeing Dr. Jimmye Norman at [facility].  She has had some issues with arthritis of her left shoulder for which she has taken tramadol one half of a 50 mg pill on a as needed basis.  Presently she is on atorvastatin 40 mg  daily for hyperlipidemia, and is on losartan 50 mg in addition to spironolactone 25 mg daily.  She presents for evaluation.  Past Surgical History:  Procedure Laterality Date   ABDOMINAL HYSTERECTOMY  2003   BONE BIOPSY     COLONOSCOPY  2019   Dr. Henrene Pastor   CORONARY ARTERY BYPASS GRAFT  05/26/2011   Procedure: CORONARY ARTERY BYPASS GRAFTING (CABG);  Surgeon: Ivin Poot, MD;  Location: [place];  Service: Open Heart Surgery;  Laterality: N/A;  Coronary Artery Bypass Graft on Pump times two utlizing left internal mammary artery and right greater saphenous vein harvested endoscopically    DIAGNOSTIC MAMMOGRAM  2019   [place] WITH CORONARY ANGIOGRAM N/A 05/24/2011  Procedure: LEFT HEART CATHETERIZATION WITH CORONARY ANGIOGRAM;  Surgeon: Leonie Man, MD;  Location: [facility] CATH LAB;  Service: Cardiovascular;  Laterality: N/A;   TONSILLECTOMY  1962   TUBAL LIGATION      Allergies  Allergen Reactions   Cardura [Doxazosin]     edema   Crestor [Rosuvastatin]     myalgias   Eplerenone     fatigued   Hydrochlorothiazide Nausea And Vomiting    dizzy   Amlodipine Swelling   Cetirizine & Related Other (See Comments)    Extreme Drowsiness - "knocks me loopy for 2 days"   Latex Rash   Levofloxacin Nausea And Vomiting   Lisinopril Other (See Comments)    Excessive mucus production, swelling   Prednisone Other (See Comments)    High dose - hallucinations   Tape Rash    Current Outpatient Medications  Medication Sig Dispense Refill   atorvastatin (LIPITOR) 40 MG tablet Take 1 tablet (40 mg total) by mouth daily. SCHEDULE OFFICE VISIT FOR FUTURE REFILLS. 90 tablet 1   diphenhydramine-acetaminophen (TYLENOL PM) 25-500 MG TABS tablet Take 1 tablet by mouth at bedtime as needed (As needed for sleep).     Iron-Vitamins (GERITOL COMPLETE PO) Take 1 tablet by mouth daily.     losartan (COZAAR) 50 MG tablet Take 50 mg by mouth daily.     OVER THE COUNTER  MEDICATION Beef Liver Vitamin     spironolactone (ALDACTONE) 25 MG tablet Take 2 tablets (50 mg total) by mouth daily. SCHEDULE OFFICE VISIT FOR FUTURE REFILLS. 180 tablet 1   Vitamin D-Vitamin K (VITAMIN K2-VITAMIN D3 PO) Take by mouth.     No current facility-administered medications for this visit.    Social History   Socioeconomic History   Marital status: Divorced    Spouse name: single   Number of children: Not on file   Years of education: Not on file   Highest education level: Not on file  Occupational History   Occupation: Retired   Tobacco Use   Smoking status: Former    Packs/day: 0.50    Years: 15.00    Total pack years: 7.50    Types: Cigarettes    Quit date: 05/23/2006    Years since quitting: 15.8   Smokeless tobacco: Never  Vaping Use   Vaping Use: Never used  Substance and Sexual Activity   Alcohol use: Yes    Alcohol/week: 0.0 standard drinks of alcohol    Comment: rare glass of wine   Drug use: No   Sexual activity: Not Currently    Birth control/protection: Post-menopausal  Other Topics Concern   Not on file  Social History Narrative   Diet:  Fish and veggies - does not eat a lot of red meat   Do you drink/eat things with caffeine?  2 cups a day   Marital status:  Divorced   Do you live in a house, apartment, assisted living, condo, trailer, etc.)? Renting a one-story house   Is it one or more stories?  One   How many persons live in your home?  Lives alone   Do you have any pets in your home? (please list)  One dog   Education:  Some college   Current or past profession:  CNA   Do you exercise:  Yes - Walks 2-3 times a week.      ADVANCED DIRECTIVES  None.  Does want CPR.       FUNCTIONAL STATUS   Has no difficulties  with ADLs.   Social Determinants of Health   Financial Resource Strain: Not on file  Food Insecurity: Not on file  Transportation Needs: Not on file  Physical Activity: Not on file  Stress: Not on file  Social Connections:  Not on file  Intimate Partner Violence: Not on file    Family History  Problem Relation Age of Onset   Mental illness Mother    Alcohol abuse Mother    Drug abuse Sister    Cancer Brother        head and neck cancer   Alcohol abuse Sister    Pancreatitis Sister    Hypertension Daughter    Fibroids Daughter    Lung cancer Maternal Grandmother    Heart disease Maternal Grandfather    Colon cancer Neg Hx    Breast cancer Neg Hx    Esophageal cancer Neg Hx    Rectal cancer Neg Hx    Stomach cancer Neg Hx     ROS General: Negative; No fevers, chills, or night sweats HEENT: Negative; No changes in vision or hearing, sinus congestion, difficulty swallowing Pulmonary: Negative; No cough, wheezing, shortness of breath, hemoptysis Cardiovascular: See HPI: No chest pain, presyncope, syncope, palpatations GI: Positive for GERD, evaluated by Dr. Henrene Pastor;  No nausea, vomiting, diarrhea, or abdominal pain GU: Positive for cyst on kidney; No dysuria, hematuria, or difficulty voiding Musculoskeletal: Positive for occasional muscle spasm; left shoulder discomfort Hematologic: Negative; no easy bruising, bleeding Endocrine: Negative; no heat/cold intolerance; no diabetes, Neuro: Negative; no changes in balance, headaches Skin: Negative; No rashes or skin lesions Psychiatric: Negative; No behavioral problems, depression Sleep: Positive for mild sleep apnea, snoring,  negative; no bruxism, restless legs, hypnogognic hallucinations. Other comprehensive 14 point system review is negative   Physical Exam BP 126/62   Pulse (!) 57   Ht '5\' 5"'$  (1.651 m)   Wt 168 lb 6.4 oz (76.4 kg)   SpO2 98%   BMI 28.02 kg/m    Repeat blood pressure by me was 124/64  Wt Readings from Last 3 Encounters:  03/21/22 168 lb 6.4 oz (76.4 kg)  12/26/21 167 lb 8 oz (76 kg)  12/14/21 167 lb 3.2 oz (75.8 kg)   General: Alert, oriented, no distress.  Skin: normal turgor, no rashes, warm and dry HEENT:  Normocephalic, atraumatic. Pupils equal round and reactive to light; sclera anicteric; extraocular muscles intact; Fundi ** Nose without nasal septal hypertrophy Mouth/Parynx benign; Mallinpatti scale 3 Neck: No JVD, no carotid bruits; normal carotid upstroke Lungs: clear to ausculatation and percussion; no wheezing or rales Chest wall: without tenderness to palpitation Heart: PMI not displaced, RRR, s1 s2 normal, 1/6 systolic murmur, no diastolic murmur, no rubs, gallops, thrills, or heaves Abdomen: soft, nontender; no hepatosplenomehaly, BS+; abdominal aorta nontender and not dilated by palpation. Back: no CVA tenderness Pulses 2+ Musculoskeletal: full range of motion, normal strength, no joint deformities Extremities: no clubbing cyanosis or edema, Homan's sign negative  Neurologic: grossly nonfocal; Cranial nerves grossly wnl Psychologic: Normal mood and affect   March 21, 2022 ECG (independently read by me): Sinus bradycardia at 57, isolalated artifact  December 22, 2020 ECG (independently read by me): Sinus bradycardia at 56, nonspecific STT changes  November 25, 2019 ECG (independently read by me): NSR at 63; nonspecific ST changes    July 21, 2019 ECG (independently read by me): Sinus bradycardia at 55; nondiagnostic T wave abnormality in V1-2  04/09/2019 ECG (independently read by me): Sinus Bradycardia at 57; normal  intervals  October 2018 ECG (independently read by me): Sinus bradycardia at 58 bpm.  Nonspecific ST-T changes.  QTc interval 394 [name].  PR interval 140 [name].  October 2016 ECG (independently read by me): Sinus bradycardia 53 bpm.  No ectopy.  Normal intervals.  August 2015 ECG (independently read by me): Sinus bradycardia at 47 beats per minute.  Nonspecific T changes.  QTc interval   385 [name]  LABS:    Latest Ref Rng & Units 05/25/2021   11:11 AM 11/16/2020    2:57 PM 11/08/2020   10:19 AM  BMP  Glucose 70 - 99 mg/dL 99  89  81   BUN 8 - 27 mg/dL '11  17  14    '$ Creatinine 0.57 - 1.00 mg/dL 0.76  0.98  0.69   BUN/Creat Ratio 12 - '28 14  17  20   '$ Sodium 134 - 144 mmol/L 140  142  140   Potassium 3.5 - 5.2 mmol/L 4.8  4.8  4.8   Chloride 96 - 106 mmol/L 104  102  102   CO2 20 - 29 mmol/L '25  21  22   '$ Calcium 8.7 - 10.3 mg/dL 9.8  9.6  9.6       Latest Ref Rng & Units 05/25/2021   11:11 AM 11/04/2020    7:17 PM 07/02/2020    9:13 AM  Hepatic Function  Total Protein 6.0 - 8.5 g/dL 5.9  7.0  6.5   Albumin 3.8 - 4.8 g/dL 4.3  4.2  4.5   AST 0 - 40 IU/L '19  19  12   '$ ALT 0 - 32 IU/L '19  18  10   '$ Alk Phosphatase 44 - 121 IU/L 92  87  85   Total Bilirubin 0.0 - 1.2 mg/dL 0.3  0.2  0.4       Latest Ref Rng & Units 12/12/2021    8:20 AM 12/05/2021    7:45 AM 05/30/2021    2:19 PM  CBC  WBC 4.0 - 10.5 K/uL 7.5  8.0  7.8   Hemoglobin 12.0 - 15.0 g/dL 14.0  13.9  14.8   Hematocrit 36.0 - 46.0 % 42.6  42.7  45.4   Platelets 150 - 400 K/uL 390  401  464    Lab Results  Component Value Date   MCV 85.0 12/12/2021   MCV 86.3 12/05/2021   MCV 86.6 05/30/2021   Lab Results  Component Value Date   TSH 1.030 05/25/2021   Lab Results  Component Value Date   HGBA1C 5.7 (H) 05/25/2021   Lipid Panel     Component Value Date/Time   CHOL 120 05/25/2021 1111   TRIG 68 05/25/2021 1111   HDL 50 05/25/2021 1111   CHOLHDL 2.4 05/25/2021 1111   CHOLHDL 2.3 11/18/2014 0928   VLDL 15 11/18/2014 0928   LDLCALC 56 05/25/2021 1111   RADIOLOGY: No results found.  IMPRESSION:  1. Essential hypertension   2. CAD in native artery   3. S/P CABG x 2   4. Hyperlipidemia LDL goal <70   5. Chronic left shoulder pain   6. Mild sleep apnea     ASSESSMENT AND PLAN: [name]. Malay Fantroy is a very pleasant 72 year old female who underwent CABG revascularization surgery in April 2013 after she was found to have high-grade left main stenosis.  A  2014 nuclear study showed normal perfusion and hyperdynamic LV function.  She is not having any anginal symptomatology.   She has  had numerous intolerances to medications and reportedly remotely could not tolerate losartan, hydralazine, amlodipine, lisinopril.  She was subsequently started on spironolactone.  When I saw her in June 2021, she had complaints of lower extremity discomfort.  A venous Doppler study was negative for DVT.  Her most recent echo Doppler study from November 30, 2020 menstruated normal LV function with EF at 60 to 65% with grade 1 diastolic dysfunction.  She has experienced some episodes of vague chest pressure not typically exertionally precipitated.  She has continued issues with daytime fatigability and sleeps with her head propped up.  Laboratory in May 2022 had shown an LDL cholesterol at 83 with total cholesterol 157 and triglycerides 85.  With her known CAD I have recommended further titration of atorvastatin from 20 mg to 40 mg daily with target LDL less than 70.  Due to vague episodes of chest pain she underwent a Lexiscan Myoview study on December 28, 2020 which showed normal perfusion without scar or ischemia.  She tells me her primary physician recently reinstated losartan and she has been taking 50 mg and tolerating this well.  She also is on spironolactone at 25 mg daily and is in need for future refills.  Her blood pressure today on combination therapy is stable.  She has had issues with left shoulder arthritis and has been taking tramadol.  She is now seeing Dr. Jimmye Norman at [facility] who will be checking laboratory.  Her LDL cholesterol had reduced to 56 in April 2023 on her increased atorvastatin regimen.  She believes she is sleeping adequately.  She will continue current therapy and as long as she is stable I will see her in 1 year for reevaluation or sooner as needed.    Troy Sine, MD, [facility]  03/23/2022 4:58 PM

## 2022-03-22 ENCOUNTER — Ambulatory Visit: Payer: Medicare Other | Admitting: Internal Medicine

## 2022-03-23 ENCOUNTER — Encounter: Payer: Self-pay | Admitting: Cardiovascular Disease

## 2022-04-13 ENCOUNTER — Encounter: Payer: Self-pay | Admitting: Radiology

## 2022-04-13 DIAGNOSIS — N390 Urinary tract infection, site not specified: Secondary | ICD-10-CM | POA: Diagnosis not present

## 2022-04-13 DIAGNOSIS — Z23 Encounter for immunization: Secondary | ICD-10-CM | POA: Diagnosis not present

## 2022-04-13 DIAGNOSIS — E559 Vitamin D deficiency, unspecified: Secondary | ICD-10-CM | POA: Diagnosis not present

## 2022-04-13 DIAGNOSIS — E7849 Other hyperlipidemia: Secondary | ICD-10-CM | POA: Diagnosis not present

## 2022-04-13 DIAGNOSIS — K219 Gastro-esophageal reflux disease without esophagitis: Secondary | ICD-10-CM | POA: Diagnosis not present

## 2022-04-13 DIAGNOSIS — M25519 Pain in unspecified shoulder: Secondary | ICD-10-CM | POA: Diagnosis not present

## 2022-04-13 DIAGNOSIS — I25119 Atherosclerotic heart disease of native coronary artery with unspecified angina pectoris: Secondary | ICD-10-CM | POA: Diagnosis not present

## 2022-04-13 DIAGNOSIS — I10 Essential (primary) hypertension: Secondary | ICD-10-CM | POA: Diagnosis not present

## 2022-04-13 DIAGNOSIS — E782 Mixed hyperlipidemia: Secondary | ICD-10-CM | POA: Diagnosis not present

## 2022-04-13 DIAGNOSIS — Z0001 Encounter for general adult medical examination with abnormal findings: Secondary | ICD-10-CM | POA: Diagnosis not present

## 2022-05-01 ENCOUNTER — Ambulatory Visit: Payer: Medicare Other | Admitting: Physician Assistant

## 2022-05-01 ENCOUNTER — Encounter: Payer: Self-pay | Admitting: Physician Assistant

## 2022-05-01 VITALS — BP 130/60 | HR 64 | Ht 63.0 in | Wt 170.0 lb

## 2022-05-01 DIAGNOSIS — Z8719 Personal history of other diseases of the digestive system: Secondary | ICD-10-CM

## 2022-05-01 DIAGNOSIS — D75839 Thrombocytosis, unspecified: Secondary | ICD-10-CM | POA: Diagnosis not present

## 2022-05-01 DIAGNOSIS — K219 Gastro-esophageal reflux disease without esophagitis: Secondary | ICD-10-CM

## 2022-05-01 DIAGNOSIS — Z86010 Personal history of colonic polyps: Secondary | ICD-10-CM

## 2022-05-01 MED ORDER — FAMOTIDINE 40 MG PO TABS: 40.0000 mg | ORAL_TABLET | Freq: Two times a day (BID) | ORAL | 11 refills | Status: DC | PRN

## 2022-05-01 NOTE — Patient Instructions (Signed)
We have sent the following medications to your pharmacy for you to pick up at your convenience: Famotidine 40 mg   _ Call back in July for schedule   You will be due for a recall colonoscopy in December 2024. We will send you a reminder in the mail when it gets closer to that time. ______________________________________________________  If your blood pressure at your visit was 140/90 or greater, please contact your primary care physician to follow up on this.  _______________________________________________________  If you are age 80 or older, your body mass index should be between 23-30. Your Body mass index is 30.11 kg/m. If this is out of the aforementioned range listed, please consider follow up with your Primary Care Provider.  If you are age 57 or younger, your body mass index should be between 19-25. Your Body mass index is 30.11 kg/m. If this is out of the aformentioned range listed, please consider follow up with your Primary Care Provider.   ________________________________________________________  The [facility] providers would like to encourage you to use [facility] to communicate with providers for non-urgent requests or questions.  Due to long hold times on the telephone, sending your provider a message by [facility] may be a faster and more efficient way to get a response.  Please allow 48 business hours for a response.  Please remember that this is for non-urgent requests.  _______________________________________________________
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
## 2022-05-01 NOTE — Progress Notes (Signed)
Assessment and plan noted ?

## 2022-05-08 ENCOUNTER — Telehealth: Payer: Self-pay | Admitting: Physician Assistant

## 2022-05-08 NOTE — Telephone Encounter (Signed)
Patient called states she is currently having some symptoms which is mucus discharge that has only happened twice so far. This weekend she has fluid like water coming from her rectum along with pain. She used Preparation H and it helped with the pain. Is not sure if her recall should be moved up to be done sooner. Please advise.

## 2022-05-08 NOTE — Telephone Encounter (Signed)
Pt scheduled to see Dr. Henrene Pastor 05/10/22@3 :40pm, she is aware of appt.

## 2022-05-10 ENCOUNTER — Ambulatory Visit: Payer: Medicare Other | Admitting: Internal Medicine

## 2022-05-17 ENCOUNTER — Telehealth: Payer: Self-pay | Admitting: Cardiovascular Disease

## 2022-05-17 DIAGNOSIS — R300 Dysuria: Secondary | ICD-10-CM | POA: Diagnosis not present

## 2022-05-17 DIAGNOSIS — N390 Urinary tract infection, site not specified: Secondary | ICD-10-CM | POA: Diagnosis not present

## 2022-05-17 DIAGNOSIS — R5383 Other fatigue: Secondary | ICD-10-CM | POA: Diagnosis not present

## 2022-05-17 NOTE — Telephone Encounter (Signed)
Pt c/o BP issue: STAT if pt c/o blurred vision, one-sided weakness or slurred speech  1. What are your last 5 BP readings? 171/85 on Saturday pullse 67, also had a nose bleed, Monday it was 141/7+ pulse 52, Tuesday evening 155/81 pulse 59 Today it was 152/80 pulse 55 after medicine 128/72 pulse 55  2. Are you having any other symptoms (ex. Dizziness, headache, blurred vision, passed out)? Headache,felt nauseated,  also dizzy. She says she just does not feel good   3. What is your BP issue? High blood pressure- she had pneumonia shot last week- wondering if her pressure went up because of the shot

## 2022-05-17 NOTE — Telephone Encounter (Signed)
Called patient, she states for the last week her blood pressures have been elevated. She states her blood pressure before medications were higher as mentioned below- after medications the blood pressures will be 130's, and 120's- she states that she would like to know if the medications should be changed. She is continuing Losartan 50 mg, and Spironolactone 25 mg once daily (list states twice daily), but last OV with Dr.Kelly mentioned only once daily. Patient states she has not had any changes to diet or increased stress. She did take a tylenol for the headache and this is helping the symptoms. I did advise we are more concerned with the blood pressure readings after medications. She will take the BP readings 45 minutes to 1 hour after medications and keep track of this, if symptoms do not improve she will call back on Friday. Patient verbalized understanding, thankful for call back.

## 2022-05-30 ENCOUNTER — Telehealth: Payer: Self-pay | Admitting: Cardiology

## 2022-05-30 NOTE — Telephone Encounter (Signed)
Patient states she is having hoarseness (3 months) with phlegm and difficult to talk (6 months).  States the hoarseness started with immediate taking of Losartan.  The rest has come on over time.  She states it is a "mild reaction".  She states she had issues in the past being on Losartan and had these issues but also had swelling in her throat and had to go to the ER. She said it is similar as before with exception of no throat swelling.No consistent BP readings other than she states it is fine BP this am 139/84 HR 64 1-2 weeks ago 146/81 HR 59  She states she is also very exhausted all the time as well, she has to force her self to get up and do things.  She does walk but she can barely do this and after is just exhausted. Has been treated for UTI and two antibiotics to help but one caused allergic reaction so went to [facility] to finish.  She states unsure if this is related to  the exhaustion but she just needs to try and figure it out.   She states stopping her atorvastatin about a week ago because she was hurting so bad and also so tired.  It has helped with the aches and pains but not the exhaustion. She states she "almost went to the ER so they would admit me and run all the test they need to find out what is wrong with me."  Noted that the ER will only work up for immediate issues and then send her back to her PCP for the running of test so it is not advised to go to ER for that.  Advised I would send the message to the physician as our primary goal will be to see about the Losartan and the issue this is causing.  Then the rest can be looked at.

## 2022-05-30 NOTE — Telephone Encounter (Signed)
Pt c/o medication issue:  1. Name of Medication:   losartan (COZAAR) 50 MG tablet    2. How are you currently taking this medication (dosage and times per day)?    3. Are you having a reaction (difficulty breathing--STAT)? no  4. What is your medication issue? Patient would like to be taking off this medication. States is causing her to be horse all the time, she has gain weight, and a lot of congestion. Please advise

## 2022-05-31 NOTE — Telephone Encounter (Signed)
Ok to try to DC losartan, but add amlodipine 5 mg for BP control.

## 2022-05-31 NOTE — Telephone Encounter (Signed)
Patient states she has had the amlodipine in the past and states "your office told me I was allergic to it." She states she cannot take this.  This was not mentioned in prior conversation. Did ask her to continue Losartan until we replace with another medication.  She states she will try

## 2022-05-31 NOTE — Telephone Encounter (Signed)
Per Dr.Kelly verbal discussion to have her see an APP.   Thanks!

## 2022-06-01 NOTE — Telephone Encounter (Signed)
Call to patient per message  Per Dr.Kelly verbal discussion to have her see an APP.    Thanks!   Patient appt. Set for NP for Tuesday 4/23 for review of BP log and medication

## 2022-06-04 ENCOUNTER — Emergency Department (HOSPITAL_COMMUNITY): Payer: Medicare Other

## 2022-06-04 ENCOUNTER — Other Ambulatory Visit: Payer: Self-pay

## 2022-06-04 ENCOUNTER — Encounter (HOSPITAL_COMMUNITY): Payer: Self-pay

## 2022-06-04 ENCOUNTER — Emergency Department (HOSPITAL_COMMUNITY)
Admission: EM | Admit: 2022-06-04 | Discharge: 2022-06-04 | Disposition: A | Discharge status: Home / Self Care | Payer: Medicare Other | Attending: Emergency Medicine | Admitting: Emergency Medicine

## 2022-06-04 DIAGNOSIS — N3001 Acute cystitis with hematuria: Secondary | ICD-10-CM

## 2022-06-04 DIAGNOSIS — Z91040 Latex allergy status: Secondary | ICD-10-CM | POA: Diagnosis not present

## 2022-06-04 DIAGNOSIS — K449 Diaphragmatic hernia without obstruction or gangrene: Secondary | ICD-10-CM | POA: Diagnosis not present

## 2022-06-04 DIAGNOSIS — N281 Cyst of kidney, acquired: Secondary | ICD-10-CM | POA: Diagnosis not present

## 2022-06-04 DIAGNOSIS — R319 Hematuria, unspecified: Secondary | ICD-10-CM | POA: Diagnosis present

## 2022-06-04 DIAGNOSIS — N2889 Other specified disorders of kidney and ureter: Secondary | ICD-10-CM | POA: Diagnosis not present

## 2022-06-04 LAB — URINALYSIS, ROUTINE W REFLEX MICROSCOPIC
Bilirubin Urine: NEGATIVE
Glucose, UA: NEGATIVE mg/dL
Ketones, ur: NEGATIVE mg/dL
Nitrite: NEGATIVE
Protein, ur: 100 mg/dL — AB
RBC / HPF: >50 RBC/hpf (ref 0–5)
Specific Gravity, Urine: 1.019 (ref 1.005–1.030)
WBC, UA: >50 WBC/hpf (ref 0–5)
pH: 5 (ref 5.0–8.0)

## 2022-06-04 LAB — COMPREHENSIVE METABOLIC PANEL
ALT: 15 U/L (ref 0–44)
AST: 16 U/L (ref 15–41)
Albumin: 3.9 g/dL (ref 3.5–5.0)
Alkaline Phosphatase: 76 U/L (ref 38–126)
Anion gap: 9 (ref 5–15)
BUN: 18 mg/dL (ref 8–23)
CO2: 25 mmol/L (ref 22–32)
Calcium: 8.6 mg/dL — ABNORMAL LOW (ref 8.9–10.3)
Chloride: 102 mmol/L (ref 98–111)
Creatinine, Ser: 0.76 mg/dL (ref 0.44–1.00)
GFR, Estimated: >60 mL/min (ref >60)
Glucose, Bld: 97 mg/dL (ref 70–99)
Potassium: 3.5 mmol/L (ref 3.5–5.1)
Sodium: 136 mmol/L (ref 135–145)
Total Bilirubin: 0.5 mg/dL (ref 0.3–1.2)
Total Protein: 6.8 g/dL (ref 6.5–8.1)

## 2022-06-04 LAB — CBC WITH DIFFERENTIAL/PLATELET
Abs Immature Granulocytes: 0.06 10*3/uL (ref 0.00–0.07)
Basophils Absolute: 0 10*3/uL (ref 0.0–0.1)
Basophils Relative: 0 %
Eosinophils Absolute: 0.2 10*3/uL (ref 0.0–0.5)
Eosinophils Relative: 2 %
HCT: 42.6 % (ref 36.0–46.0)
Hemoglobin: 14.1 g/dL (ref 12.0–15.0)
Immature Granulocytes: 1 %
Lymphocytes Relative: 23 %
Lymphs Abs: 2.4 10*3/uL (ref 0.7–4.0)
MCH: 28.5 pg (ref 26.0–34.0)
MCHC: 33.1 g/dL (ref 30.0–36.0)
MCV: 86.1 fL (ref 80.0–100.0)
Monocytes Absolute: 0.8 10*3/uL (ref 0.1–1.0)
Monocytes Relative: 8 %
Neutro Abs: 6.8 10*3/uL (ref 1.7–7.7)
Neutrophils Relative %: 66 %
Platelets: 425 10*3/uL — ABNORMAL HIGH (ref 150–400)
RBC: 4.95 MIL/uL (ref 3.87–5.11)
RDW: 13.6 % (ref 11.5–15.5)
WBC: 10.2 10*3/uL (ref 4.0–10.5)
nRBC: 0 % (ref 0.0–0.2)

## 2022-06-04 MED ORDER — ONDANSETRON 4 MG PO TBDP
4.0000 mg | ORAL_TABLET | Freq: Once | ORAL | Status: AC | PRN
  Administered 2022-06-04: 4 mg via ORAL
  Filled 2022-06-04: qty 1

## 2022-06-04 MED ORDER — ONDANSETRON HCL 4 MG/2ML IJ SOLN: 4.0000 mg | Freq: Once | INTRAMUSCULAR | Status: DC

## 2022-06-04 MED ORDER — SODIUM CHLORIDE 0.9 % IV BOLUS
500.0000 mL | Freq: Once | INTRAVENOUS | Status: AC
  Administered 2022-06-04: 500 mL via INTRAVENOUS

## 2022-06-04 MED ORDER — CEPHALEXIN 500 MG PO CAPS: 500.0000 mg | ORAL_CAPSULE | Freq: Four times a day (QID) | ORAL | 0 refills | Status: DC

## 2022-06-04 NOTE — ED Notes (Signed)
06/04/22 1550 hrs  Pt called and stated she was given a prescription she was allergic to. Nurse looked at AVS and allergy list and informed pt that cephalaxin was not on her list. Nurse asked what happened to her with her allergy ? Pt stated she instantly after 1 pill began having huge amounts of nasal drainage, having to blow her nose and vomiting from so much draining down her throat.  Nurse asked pt is she wanted this added to her chart with the reaction to it. Pt said " no, I have taken Keflex before and if I can find a pharmacy with Keflex and not cephalaxin version I should be Ok'.

## 2022-06-04 NOTE — Discharge Instructions (Signed)
You are seen in the emergency department for continued urinary symptoms.  Your lab work was stable but your urinalysis showed signs of blood and possible infection.  This was sent for culture.  Your CAT scan showed some bladder thickening concerning for infection.  They also showed a mass near your left kidney that we will need follow-up.  We are starting you on antibiotics.  Please follow-up with your primary care doctor and [facility].  Return to the emergency department if any worsening or concerning symptoms

## 2022-06-04 NOTE — ED Provider Notes (Signed)
[place] [facility] AT [facility] Provider Note   CSN: [id number] Arrival date & time: 06/04/22  1146     History  Chief Complaint  Patient presents with   Hematuria    [name] [name] is a 72 y.o. female.  She is presenting with new complaint of hematuria that started this morning.  She has had ongoing urine problems for months.  She has had multiple courses of Macrobid.  She continues to have urinary urgency and some bladder discomfort.  Today was associated with hematuria.  She has had some low-grade fevers of 99.  She is nauseous but without vomiting.  No diarrhea no vaginal bleeding or discharge.  She is currently not on antibiotics.  The history is provided by the patient.  Hematuria This is a new problem. The current episode started 3 to 5 hours ago. The problem has not changed since onset.Associated symptoms include abdominal pain. Pertinent negatives include no chest pain, no headaches and no shortness of breath. Exacerbated by: Urinating. Nothing relieves the symptoms. She has tried nothing for the symptoms. The treatment provided no relief.       Home Medications Prior to Admission medications   Medication Sig Start Date End Date Taking? Authorizing Provider  albuterol (VENTOLIN HFA) 108 (90 Base) MCG/ACT inhaler Inhale 1 puff into the lungs every 4 (four) hours as needed. 05/17/18   [provider]  atorvastatin (LIPITOR) 40 MG tablet Take 1 tablet (40 mg total) by mouth daily. SCHEDULE OFFICE VISIT FOR FUTURE REFILLS. Patient not taking: Reported on 05/01/2022 02/21/22   Lennette Bihari, MD  Azelastine HCl 137 MCG/SPRAY SOLN  07/27/18   [provider]  B Complex Vitamins (B COMPLEX 1 PO) Take 1 tablet by mouth every other day. 03/16/22   [provider]  conjugated estrogens (PREMARIN) vaginal cream Place 1 applicator vaginally once a week. 04/19/18   [provider]  Desiccated Beef Liver POWD Take 1 Dose by mouth every  other day.    [provider]  diclofenac Sodium (VOLTAREN) 1 % GEL Apply 2 g topically as needed. 07/30/18   [provider]  famotidine (PEPCID) 20 MG tablet Take 20-40 mg by mouth as needed for heartburn or indigestion.    [provider]  famotidine (PEPCID) 40 MG tablet Take 1 tablet (40 mg total) by mouth 3 times/day as needed-between meals & bedtime for heartburn or indigestion. 05/01/22   Unk Lightning, PA  loratadine (CLARITIN) 10 MG tablet Take 10 mg by mouth as needed. 06/17/18   [provider]  losartan (COZAAR) 50 MG tablet Take 50 mg by mouth daily.    [provider]  meloxicam (MOBIC) 7.5 MG tablet Take 7.5 mg by mouth as needed. 04/13/22   [provider]  OVER THE COUNTER MEDICATION Beef Liver Vitamin    [provider]  spironolactone (ALDACTONE) 25 MG tablet Take 2 tablets (50 mg total) by mouth daily. SCHEDULE OFFICE VISIT FOR FUTURE REFILLS. Patient taking differently: Take 25 mg by mouth daily. SCHEDULE OFFICE VISIT FOR FUTURE REFILLS. 02/21/22   Lennette Bihari, MD  traMADol (ULTRAM) 50 MG tablet Take 25 mg by mouth as needed. 09/18/18   [provider]  traZODone (DESYREL) 50 MG tablet Take 25 mg by mouth as needed. 04/13/22   [provider]  Vitamin D-Vitamin K (VITAMIN K2-VITAMIN D3 PO) Take by mouth.    [provider]      Allergies  Crestor [rosuvastatin], Eplerenone, Hydrochlorothiazide, Amlodipine, Cetirizine & related, Latex, Levofloxacin, Lisinopril, Prednisone, and Tape    Review of Systems   Review of Systems  Constitutional:  Positive for fever.  Respiratory:  Negative for shortness of breath.   Cardiovascular:  Negative for chest pain.  Gastrointestinal:  Positive for abdominal pain and nausea. Negative for vomiting.  Genitourinary:  Positive for frequency, hematuria and urgency. Negative for vaginal bleeding and vaginal discharge.  Neurological:  Negative for  headaches.    Physical Exam Updated Vital Signs BP (!) 159/76 (BP Location: Right Arm)   Pulse 69   Temp 97.8 F (36.6 C)   Resp 18   Ht 5\' 4"  (1.626 m)   Wt 74.8 kg   SpO2 99%   BMI 28.32 kg/m  Physical Exam Vitals and nursing note reviewed.  Constitutional:      General: She is not in acute distress.    Appearance: Normal appearance. She is well-developed.  HENT:     Head: Normocephalic and atraumatic.  Eyes:     Conjunctiva/sclera: Conjunctivae normal.  Cardiovascular:     Rate and Rhythm: Normal rate and regular rhythm.     Heart sounds: No murmur heard. Pulmonary:     Effort: Pulmonary effort is normal. No respiratory distress.     Breath sounds: Normal breath sounds.  Abdominal:     Palpations: Abdomen is soft.     Tenderness: There is no abdominal tenderness. There is no guarding or rebound.  Musculoskeletal:        General: Normal range of motion.     Cervical back: Neck supple.     Right lower leg: No edema.     Left lower leg: No edema.  Skin:    General: Skin is warm and dry.     Capillary Refill: Capillary refill takes less than 2 seconds.  Neurological:     General: No focal deficit present.     Mental Status: She is alert.     ED Results / Procedures / Treatments   Labs (all labs ordered are listed, but only abnormal results are displayed) Labs Reviewed  URINALYSIS, ROUTINE W REFLEX MICROSCOPIC - Abnormal; Notable for the following components:      Result Value   Color, Urine RED (*)    APPearance CLOUDY (*)    Hgb urine dipstick LARGE (*)    Protein, ur 100 (*)    Leukocytes,Ua MODERATE (*)    Bacteria, UA RARE (*)    All other components within normal limits  COMPREHENSIVE METABOLIC PANEL - Abnormal; Notable for the following components:   Calcium 8.6 (*)    All other components within normal limits  CBC WITH DIFFERENTIAL/PLATELET - Abnormal; Notable for the following components:   Platelets 425 (*)    All other components within normal  limits  URINE CULTURE    EKG None  Radiology CT Renal Stone Study  Result Date: 06/04/2022 CLINICAL DATA:  Hematuria.  Nausea.  Recent urinary tract infection. EXAM: CT ABDOMEN AND PELVIS WITHOUT CONTRAST TECHNIQUE: Multidetector CT imaging of the abdomen and pelvis was performed following the standard protocol without IV contrast. RADIATION DOSE REDUCTION: This exam was performed according to the departmental dose-optimization program which includes automated exposure control, adjustment of the mA and/or kV according to patient size and/or use of iterative reconstruction technique. COMPARISON:  Abdominal ultrasound 01/13/2019. CT of the abdomen and pelvis 04/16/2007 FINDINGS: Lower chest: Linear atelectasis or scarring is present in the right lower lobe. Lung bases  are otherwise clear. The heart size is normal. No significant pleural or pericardial effusion is present. Hepatobiliary: A well-defined hypodense lesion posteriorly in the right lobe likely represents a 8 mm cyst or hemangioma. A second well-defined lesion near the dome of the liver measures 14 mm, increased in size since the prior CT. No other discrete lesions are present. The common bile duct and gallbladder are normal. Pancreas: Unremarkable. No pancreatic ductal dilatation or surrounding inflammatory changes. Spleen: Normal in size without focal abnormality. Adrenals/Urinary Tract: Adrenal glands are normal bilaterally. No stone is present. No obstruction is present. An exophytic cyst at the lower pole of the left kidney has increased in size, now measuring 7.6 x 7.6 cm. There is some calcification at the margin of the cyst. Ureters are within normal limits bilaterally. Some thickening and inflammatory changes present along the wall of the urinary bladder. Stomach/Bowel: A small hiatal hernia is present. Stomach is within normal limits. A duodenal diverticulum is present without inflammatory change. Distal duodenum is normal. Small bowel is  within normal limits. Terminal ileum is normal. The appendix is visualized and within normal limits. Vascular/Lymphatic: Atherosclerotic calcifications are present in the aorta branch vessels. No aneurysm is present. Reproductive: Status post hysterectomy. No adnexal masses. Other: No abdominal wall hernia or abnormality. No abdominopelvic ascites. Musculoskeletal: Leftward curvature of the lumbar spine is centered at L2-3. No significant listhesis present. Lumbar lordosis is preserved. Bony pelvis is normal. The hips are located and within normal limits bilaterally. IMPRESSION: 1. Some thickening and inflammatory changes along the wall of the urinary bladder compatible with acute cystitis. 2. No other acute or focal lesion to explain the patient's symptoms. 3. Interval increase in size of exophytic cyst at the lower pole of the left kidney, now measuring 7.6 x 7.6 cm. This is a Bosniak 2 F lesion. Recommend follow-up ultrasound, CT or MRI in 6 months. 4. Small hiatal hernia. 5. Leftward curvature of the lumbar spine centered at L2-3. 6.  Aortic Atherosclerosis (ICD10-I70.0). Electronically Signed   By: Marin Roberts M.D.   On: 06/04/2022 14:34    Procedures Procedures    Medications Ordered in ED Medications  sodium chloride 0.9 % bolus 500 mL (has no administration in time range)  ondansetron (ZOFRAN) injection 4 mg (has no administration in time range)  ondansetron (ZOFRAN-ODT) disintegrating tablet 4 mg (4 mg Oral Given 06/04/22 1200)    ED Course/ Medical Decision Making/ A&P                             Medical Decision Making Amount and/or Complexity of Data Reviewed Labs: ordered. Radiology: ordered.  Risk Prescription drug management.   This patient complains of nausea urinary symptoms hematuria; this involves an extensive number of treatment Options and is a complaint that carries with it a high risk of complications and morbidity. The differential includes UTI,  pyelonephritis, bladder mass, renal injury, dehydration, metabolic derangement  I ordered, reviewed and interpreted labs, which included CBC with normal white count normal hemoglobin normal platelets, chemistries with normal renal function, urinalysis with possible signs of infection greater than 50 red blood cells greater than 50 white blood cells rare bacteria I ordered medication IV fluids and nausea medication and reviewed PMP when indicated. I ordered imaging studies which included CT renal and I independently    visualized and interpreted imaging which showed bladder thickening possible cystitis, also has renal mass on left Previous records obtained and  reviewed in epic including recent PCP visits Cardiac monitoring reviewed, normal sinus rhythm Social determinants considered, no significant barriers Critical Interventions: None  After the interventions stated above, I reevaluated the patient and found patient to be well-appearing in no distress Admission and further testing considered, no indications for admission or further workup at this time.  Given contact information for urology and will cover with antibiotics.  Reviewed results of testing with patient.  She was aware of the renal mass but now was informed that it is a larger size and will need to be closely followed.  Return instructions discussed         Final Clinical Impression(s) / ED Diagnoses Final diagnoses:  Acute cystitis with hematuria  Renal mass, left    Rx / DC Orders ED Discharge Orders          Ordered    cephALEXin (KEFLEX) 500 MG capsule  4 times daily        06/04/22 1441              Terrilee Files, MD 06/04/22 1711

## 2022-06-04 NOTE — ED Triage Notes (Addendum)
Pt presents with hematuria and dysuria that started last Pm. Pt reports associated nausea and fatigue. Pt states she started having a UTI 2 months ago and pt has been on and off Macrobid. Her last dose was 2 weeks ago.

## 2022-06-04 NOTE — ED Notes (Signed)
Pt states that pain & nausea are gone. Patient states when using the bathroom recently "no blood noted."

## 2022-06-04 NOTE — ED Notes (Signed)
Patient transported to CT 

## 2022-06-05 LAB — URINE CULTURE

## 2022-06-06 ENCOUNTER — Ambulatory Visit: Payer: Medicare Other | Admitting: Student

## 2022-06-06 LAB — URINE CULTURE: Culture: 20000 — AB

## 2022-06-07 ENCOUNTER — Telehealth (HOSPITAL_BASED_OUTPATIENT_CLINIC_OR_DEPARTMENT_OTHER): Payer: Self-pay

## 2022-06-07 NOTE — Telephone Encounter (Signed)
Post ED Visit - Positive Culture Follow-up  Culture report reviewed by antimicrobial stewardship pharmacist: [facility]  Eldridge Scot, Pharm.D.  Celedonio Miyamoto, [address].D., BCPS AQ-ID  Garvin Fila, Pharm.D., BCPS  Georgina Pillion, Pharm.D., BCPS  [place], [address].D., BCPS, AAHIVP  Estella Husk, Pharm.D., BCPS, AAHIVP  Lysle Pearl, PharmD, BCPS  Phillips Climes, PharmD, BCPS  Agapito Games, PharmD, BCPS  Verlan Friends, PharmD  Mervyn Gay, PharmD, BCPS  Vinnie Level, PharmD  [facility]  Len Childs, PharmD  Greer Pickerel, PharmD  Adalberto Cole, PharmD  Perlie Gold, Rph  Lonell Face) Jean Rosenthal, PharmD  Earl Many, PharmD  Junita Push, PharmD  Dorna Leitz, PharmD  Terrilee Files, PharmD  Lynann Beaver, PharmD  Keturah Barre, PharmD  Loralee Pacas, PharmD  Bernadene Person, PharmD   Positive urine culture Treated with Cephalexin, organism sensitive to the same and no further patient follow-up is required at this time.  Sandria Senter 06/07/2022, 12:45 PM

## 2022-06-14 ENCOUNTER — Inpatient Hospital Stay: Payer: Medicare Other | Attending: Physician Assistant

## 2022-06-14 DIAGNOSIS — Z87891 Personal history of nicotine dependence: Secondary | ICD-10-CM | POA: Insufficient documentation

## 2022-06-14 DIAGNOSIS — D75839 Thrombocytosis, unspecified: Secondary | ICD-10-CM

## 2022-06-14 DIAGNOSIS — N281 Cyst of kidney, acquired: Secondary | ICD-10-CM | POA: Diagnosis not present

## 2022-06-14 DIAGNOSIS — D75838 Other thrombocytosis: Secondary | ICD-10-CM | POA: Diagnosis not present

## 2022-06-14 DIAGNOSIS — E611 Iron deficiency: Secondary | ICD-10-CM | POA: Diagnosis not present

## 2022-06-14 LAB — IRON AND TIBC
Iron: 74 ug/dL (ref 28–170)
Saturation Ratios: 21 % (ref 10.4–31.8)
TIBC: 348 ug/dL (ref 250–450)
UIBC: 274 ug/dL

## 2022-06-14 LAB — CBC WITH DIFFERENTIAL/PLATELET
Abs Immature Granulocytes: 0.04 10*3/uL (ref 0.00–0.07)
Basophils Absolute: 0.1 10*3/uL (ref 0.0–0.1)
Basophils Relative: 1 %
Eosinophils Absolute: 0.3 10*3/uL (ref 0.0–0.5)
Eosinophils Relative: 3 %
HCT: 44 % (ref 36.0–46.0)
Hemoglobin: 14.4 g/dL (ref 12.0–15.0)
Immature Granulocytes: 0 %
Lymphocytes Relative: 34 %
Lymphs Abs: 3.3 10*3/uL (ref 0.7–4.0)
MCH: 28.5 pg (ref 26.0–34.0)
MCHC: 32.7 g/dL (ref 30.0–36.0)
MCV: 87.1 fL (ref 80.0–100.0)
Monocytes Absolute: 0.7 10*3/uL (ref 0.1–1.0)
Monocytes Relative: 8 %
Neutro Abs: 5.4 10*3/uL (ref 1.7–7.7)
Neutrophils Relative %: 54 %
Platelets: 461 10*3/uL — ABNORMAL HIGH (ref 150–400)
RBC: 5.05 MIL/uL (ref 3.87–5.11)
RDW: 13.7 % (ref 11.5–15.5)
WBC: 9.9 10*3/uL (ref 4.0–10.5)
nRBC: 0 % (ref 0.0–0.2)

## 2022-06-14 LAB — FERRITIN: Ferritin: 51 ng/mL (ref 11–307)

## 2022-06-15 ENCOUNTER — Ambulatory Visit: Payer: Medicare Other | Admitting: Urology

## 2022-06-15 VITALS — BP 148/78 | HR 71 | Ht 64.0 in | Wt 165.0 lb

## 2022-06-15 DIAGNOSIS — N2889 Other specified disorders of kidney and ureter: Secondary | ICD-10-CM

## 2022-06-15 DIAGNOSIS — N390 Urinary tract infection, site not specified: Secondary | ICD-10-CM | POA: Diagnosis not present

## 2022-06-15 DIAGNOSIS — N952 Postmenopausal atrophic vaginitis: Secondary | ICD-10-CM

## 2022-06-15 DIAGNOSIS — N281 Cyst of kidney, acquired: Secondary | ICD-10-CM

## 2022-06-15 DIAGNOSIS — R310 Gross hematuria: Secondary | ICD-10-CM | POA: Diagnosis not present

## 2022-06-15 LAB — BLADDER SCAN AMB NON-IMAGING: Scan Result: 45

## 2022-06-15 LAB — URINALYSIS, ROUTINE W REFLEX MICROSCOPIC
Bilirubin, UA: NEGATIVE
Glucose, UA: NEGATIVE
Ketones, UA: NEGATIVE
Leukocytes,UA: NEGATIVE
Nitrite, UA: NEGATIVE
Protein,UA: NEGATIVE
RBC, UA: NEGATIVE
Specific Gravity, UA: 1.025 (ref 1.005–1.030)
Urobilinogen, Ur: 0.2 mg/dL (ref 0.2–1.0)
pH, UA: 5 (ref 5.0–7.5)

## 2022-06-15 MED ORDER — TRIMETHOPRIM 100 MG PO TABS
100.0000 mg | ORAL_TABLET | Freq: Every day | ORAL | 1 refills | Status: DC
Start: 2022-06-15 — End: 2022-07-24

## 2022-06-15 NOTE — Progress Notes (Signed)
post void residual=45 ?
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
## 2022-06-16 ENCOUNTER — Encounter: Payer: Self-pay | Admitting: Urology
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
## 2022-06-21 ENCOUNTER — Other Ambulatory Visit: Payer: Self-pay

## 2022-06-21 ENCOUNTER — Encounter: Payer: Self-pay | Admitting: Physician Assistant

## 2022-06-21 ENCOUNTER — Inpatient Hospital Stay: Payer: Medicare Other | Admitting: Physician Assistant

## 2022-06-21 VITALS — HR 65 | Temp 97.5°F | Resp 18 | Wt 169.0 lb

## 2022-06-21 DIAGNOSIS — Z87891 Personal history of nicotine dependence: Secondary | ICD-10-CM | POA: Diagnosis not present

## 2022-06-21 DIAGNOSIS — E611 Iron deficiency: Secondary | ICD-10-CM | POA: Diagnosis not present

## 2022-06-21 DIAGNOSIS — D75839 Thrombocytosis, unspecified: Secondary | ICD-10-CM

## 2022-06-21 DIAGNOSIS — D75838 Other thrombocytosis: Secondary | ICD-10-CM | POA: Diagnosis not present

## 2022-06-21 DIAGNOSIS — N281 Cyst of kidney, acquired: Secondary | ICD-10-CM | POA: Diagnosis not present

## 2022-06-21 NOTE — Patient Instructions (Signed)
[facility] **VISIT SUMMARY & IMPORTANT INSTRUCTIONS **   You were seen today by Rojelio Brenner PA-C for your elevated platelets and low iron levels.    ELEVATED PLATELETS Your bone marrow biopsy did not show any signs of bone marrow or blood cancer. It did show that your iron levels are low. This leads [place] to believe that your platelets are elevated due to your low iron levels.  This is called "reactive thrombocytosis."  It is your body's normal response (elevated platelets) in response to abnormally low iron.  LOW IRON Your iron levels are lower than they should be. They are slowly improving since you started taking the [facility].  Continue to take the Geritol daily in the morning (make sure it is taking at a different time of day than your acid blocking medications).  You can take your iron with a glass of orange juice or Vitamin C supplement, which will help your body absorb it better.   LIVER AND KIDNEY LESIONS:  Your CT scan from the ER showed "spots" on your liver and kidney.  These are not necessarily cancerous, but is important to get a better look at them.  Your urologist (Dr. Annabell Howells) has ordered an MRI of your abdomen.  If you have not heard from his office by next week, please reach out to them to have your MRI scheduled.    LABS: Return in 6 months for repeat labs  FOLLOW-UP APPOINTMENT: Office visit in 6 months, after labs  ** Thank you for trusting me with your healthcare!  I strive to provide all of my patients with quality care at each visit.  If you receive a survey for this visit, I would be so grateful to you for taking the time to provide feedback.  Thank you in advance!  ~ Walburga Hudman                   Dr. Doreatha Massed   &   Rojelio Brenner, PA-C   - - - - - - - - - - - - - - - - - -    Thank you for choosing [facility] at [facility] to provide your oncology and hematology care.  To afford each patient  quality time with our provider, please arrive at least 15 minutes before your scheduled appointment time.   If you have a lab appointment with the Cancer Center please come in thru the Main Entrance and check in at the main information desk.  You need to re-schedule your appointment should you arrive 10 or more minutes late.  We strive to give you quality time with our providers, and arriving late affects you and other patients whose appointments are after yours.  Also, if you no show three or more times for appointments you may be dismissed from the clinic at the providers discretion.     Again, thank you for choosing [facility].  Our hope is that these requests will decrease the amount of time that you wait before being seen by our physicians.       _____________________________________________________________  Should you have questions after your visit to [facility], please contact our office at [phone] and follow the prompts.  Our office hours are 8:00 a.m. and 4:30 p.m. Monday - Friday.  Please note that voicemails left after 4:00 p.m. may not be returned until the following business day.  We are closed  weekends and major holidays.  You do have access to a nurse 24-7, just call the main number to the clinic [phone] and do not press any options, hold on the line and a nurse will answer the phone.    For prescription refill requests, have your pharmacy contact our office and allow 72 hours.

## 2022-06-27 ENCOUNTER — Encounter: Payer: Self-pay | Admitting: Urology

## 2022-06-27 NOTE — Telephone Encounter (Signed)
Please see patient message below regarding trimethoprim rx.  I will inform her of imaging protocol.

## 2022-06-29 ENCOUNTER — Ambulatory Visit: Payer: Medicare Other | Admitting: Urology

## 2022-06-29 ENCOUNTER — Other Ambulatory Visit: Payer: Self-pay

## 2022-06-29 MED ORDER — AMPICILLIN 500 MG PO CAPS: 500.0000 mg | ORAL_CAPSULE | Freq: Every day | ORAL | 5 refills | Status: DC

## 2022-07-04 ENCOUNTER — Telehealth: Payer: Self-pay | Admitting: Physician Assistant

## 2022-07-04 NOTE — Telephone Encounter (Signed)
Noted, thanks!

## 2022-07-04 NOTE — Telephone Encounter (Signed)
Patient called requesting for her Cat scan that was done on 04/21 at the ED be looked at. Requesting a call back once it is looked at to see the next steps she will have to take. Please advise, thank you.

## 2022-07-04 NOTE — Telephone Encounter (Signed)
PT is calling to have the MRI discussed scheduled because she has been having an issue getting an order in. Requesting call back.

## 2022-07-04 NOTE — Telephone Encounter (Signed)
PT called back to advise that she has been scheduled for an MRI on Saturday.

## 2022-07-04 NOTE — Telephone Encounter (Signed)
Returned call to patient. Pt is concerned with findings of liver lesions. Pt's urologist has already ordered an MRI abdomen for further evaluation of Renal mass/cyst. I told pt that is likely what we would order as a next step for further characterize the lesions that were noted on her liver as well. Patient states that she has still not been able to schedule MRI, pt has been advised to reach out to [facility] for an update on authorization for MRI. Pt states that if she makes no progress with them she will likely call us back to see if we can order it for her. Pt was thankful for the return call.

## 2022-07-06 ENCOUNTER — Telehealth: Payer: Self-pay | Admitting: Cardiovascular Disease

## 2022-07-06 DIAGNOSIS — R300 Dysuria: Secondary | ICD-10-CM | POA: Diagnosis not present

## 2022-07-06 DIAGNOSIS — N3090 Cystitis, unspecified without hematuria: Secondary | ICD-10-CM | POA: Diagnosis not present

## 2022-07-06 DIAGNOSIS — R1031 Right lower quadrant pain: Secondary | ICD-10-CM | POA: Diagnosis not present

## 2022-07-06 NOTE — Telephone Encounter (Signed)
Per patient schedule message:  "Need to get off spirolactone. I am taking 50mg  a day but having issues with kidney /bladder infections etc. please advise"

## 2022-07-06 NOTE — Telephone Encounter (Signed)
Returned call to patient of Dr. Tresa Endo. She has been on spironolactone for >10 years. She states recently she has been having more problems with kidney and bladder. She is thinking that she needs to give herself a break from this medication, since it is a diuretic. She thinks it could be too hard on her system. She said she pees a lot, will sometimes get blood in her urine. Per 05/2022 labs, CMET is normal, BUN/creat WNL - informed patient of this to which she was relieved. She has been to a urologist - notes and testing done in [facility]. She also takes PPI and antiacids. She reports a liver lesion that she thinks is causing her pain too.  Advised would send a message to Dr. Tresa Endo to review her concerns and any notes from other providers regarding her urological issues and we would touch base with his advice

## 2022-07-08 ENCOUNTER — Ambulatory Visit (HOSPITAL_COMMUNITY)
Admission: RE | Admit: 2022-07-08 | Discharge: 2022-07-08 | Disposition: A | Discharge status: Home / Self Care | Payer: Medicare Other | Source: Ambulatory Visit | Attending: Urology | Admitting: Urology

## 2022-07-08 DIAGNOSIS — N281 Cyst of kidney, acquired: Secondary | ICD-10-CM | POA: Diagnosis not present

## 2022-07-08 DIAGNOSIS — N2889 Other specified disorders of kidney and ureter: Secondary | ICD-10-CM | POA: Diagnosis not present

## 2022-07-08 DIAGNOSIS — K7689 Other specified diseases of liver: Secondary | ICD-10-CM | POA: Diagnosis not present

## 2022-07-08 MED ORDER — GADOBUTROL 1 MMOL/ML IV SOLN
7.5000 mL | Freq: Once | INTRAVENOUS | Status: AC | PRN
  Administered 2022-07-08: 7.5 mL via INTRAVENOUS

## 2022-07-11 ENCOUNTER — Encounter: Payer: Self-pay | Admitting: Urology

## 2022-07-13 NOTE — Progress Notes (Signed)
Cardiology Clinic Note   Date: 07/14/2022 ID: JULIANAH ERCOLANI, DOB 1950/06/04, MRN [id number]  Primary Cardiologist:  Nicki Guadalajara, MD  Patient Profile    [name] [name] is a 72 y.o. female who presents to the clinic today for BP management.   Past medical history significant for: CAD. LHC 05/24/2011 (abnormal cardiac CTA): Severe calcified distal LMCA with involvement of ostial LCx.  CTS consult. CABG x 2 05/26/2011: LIMA to LAD, SVG to OM 2. Echo 11/30/2020: EF 60 to 65%.  Grade I DD.  Normal RV function.  No valvular abnormalities. Nuclear stress test 12/28/2020: Normal low risk study.  Unchanged from 2019. Hypertension. Hyperlipidemia. Lipid panel 03/14/2022: LDL 69, HDL 58, TG 74, total 142.   History of Present Illness    [name] [name] is a longtime patient of cardiology.  She is followed by Dr. Tresa Endo for the above outlined history.  Patient was last seen in the office by Dr. Tresa Endo on 03/21/2022 for routine follow-up.  She reported some issues with blood pressure lability.  Her blood pressure was stable during visit and all medications were continued.  Today, patient reports she has been through a lot since seeing Dr. Tresa Endo in February. She has a cyst on her kidney and liver that she just found out was benign. She has been suffering from arthritis pain that has gotten worse. She also has recurrent UTIs and was evaluated in the ED in April. She would like to come off the spironolactone. She states it causes her to urinate frequently and with the recurrent UTIs it has gotten worse. She is intolerant/allergic to multiple other anti-hypertensives. She spot checks BP at home typically when she feels "off." She tries to restrict her dietary sodium. She otherwise is doing well. She denies chest pain, pressure, tightness. No shortness of breath, DOE, lower extremity edema, orthopnea or PND. She has been inactive for about 6 months secondary to her recent health issues. She likes to walk for exercise.      ROS: All other systems reviewed and are otherwise negative except as noted in History of Present Illness.  Studies Reviewed    ECG is not ordered today.   Physical Exam    VS:  BP (!) 160/73 (BP Location: Right Arm, Patient Position: Sitting, Cuff Size: Normal)   Pulse (!) 55   Ht 5\' 4"  (1.626 m)   Wt 172 lb 3.2 oz (78.1 kg)   SpO2 98%   BMI 29.56 kg/m  , BMI Body mass index is 29.56 kg/m.  GEN: Well nourished, well developed, in no acute distress. Neck: No JVD or carotid bruits. Cardiac: RRR. No murmurs. No rubs or gallops.   Respiratory:  Respirations regular and unlabored. Clear to auscultation without rales, wheezing or rhonchi. GI: Soft, nontender, nondistended. Extremities: Radials/DP/PT 2+ and equal bilaterally. No clubbing or cyanosis. No edema.  Skin: Warm and dry, no rash. Neuro: Strength intact.  Assessment & Plan    CAD.  S/p CABG x  16 May 2011.  Normal nuclear stress test November 2022.  Patient denies chest pain, pressure, tightness. Continue atorvastatin. Hypertension. BP today 160/73 at intake and 138/70 on recheck.  Patient denies headaches, dizziness or vision changes. Patient would like to stop spironolactone secondary to recurrent UTIs and urinary frequency on the medication. She is intolerant/allergic to multiple anti-hypertensive medications. Will try hydralazine 25 mg bid. She will My Chart a 2 week BP log. If she cannot tolerate hydralazine she will restart spironolactone and I  will refer her to Pharm D hypertension clinic.  Hyperlipidemia. LDL January 2024 69, at goal. Continue atorvastatin.   Disposition: Stop spironolactone. Start hydralazine 25 mg bid. My Chart 2 week BP log. Return in 6 months or sooner as needed.          Signed, Etta Grandchild. Orestes Geiman, DNP, NP-C

## 2022-07-14 ENCOUNTER — Telehealth: Payer: Self-pay

## 2022-07-14 ENCOUNTER — Encounter: Payer: Self-pay | Admitting: Student

## 2022-07-14 ENCOUNTER — Ambulatory Visit: Payer: Medicare Other | Attending: Student | Admitting: Student

## 2022-07-14 ENCOUNTER — Ambulatory Visit: Payer: Medicare Other | Admitting: Allergy & Immunology

## 2022-07-14 VITALS — BP 138/70 | HR 55 | Ht 64.0 in | Wt 172.2 lb

## 2022-07-14 DIAGNOSIS — I2510 Atherosclerotic heart disease of native coronary artery without angina pectoris: Secondary | ICD-10-CM | POA: Diagnosis not present

## 2022-07-14 DIAGNOSIS — E785 Hyperlipidemia, unspecified: Secondary | ICD-10-CM

## 2022-07-14 DIAGNOSIS — I10 Essential (primary) hypertension: Secondary | ICD-10-CM

## 2022-07-14 MED ORDER — HYDRALAZINE HCL 25 MG PO TABS: 25.0000 mg | ORAL_TABLET | Freq: Two times a day (BID) | ORAL | 5 refills | Status: DC

## 2022-07-14 NOTE — Telephone Encounter (Signed)
-----   Message from Bjorn Pippin, MD sent at 07/14/2022  7:08 AM EDT ----- The left renal cyst is felt to be a benign lesion and no follow is needed.   The same goes for the liver where benign cysts were noted.    ----- Message ----- From: Grier Rocher, CMA Sent: 07/13/2022   4:27 PM EDT To: Bjorn Pippin, MD  Please review.

## 2022-07-14 NOTE — Patient Instructions (Signed)
Medication Instructions:  STOP SPIRONOLACTONE   START: HYDRALAZINE TWICE DAILY  *If you need a refill on your cardiac medications before your next appointment, please call your pharmacy*  Lab Work: None Ordered At This Time.  If you have labs (blood work) drawn today and your tests are completely normal, you will receive your results only by: MyChart Message (if you have MyChart) OR A paper copy in the mail If you have any lab test that is abnormal or we need to change your treatment, we will call you to review the results.  Testing/Procedures: None Ordered At This Time.   Follow-Up: At [facility], you and your health needs are our priority.  As part of our continuing mission to provide you with exceptional heart care, we have created designated Provider Care Teams.  These Care Teams include your primary Cardiologist (physician) and Advanced Practice Providers (APPs -  Physician Assistants and Nurse Practitioners) who all work together to provide you with the care you need, when you need it.  Your next appointment:   6 month(s)  Provider:   Dr. Tresa Endo  Please check your blood pressure daily, write this down and send the readings through MyChart in 2 WEEKs.

## 2022-07-14 NOTE — Telephone Encounter (Signed)
Sent mychart message

## 2022-07-14 NOTE — Telephone Encounter (Signed)
Patient is aware of MD response and voiced understanding 

## 2022-07-17 ENCOUNTER — Telehealth: Payer: Self-pay | Admitting: Cardiovascular Disease

## 2022-07-17 MED ORDER — HYDRALAZINE HCL 25 MG PO TABS: 25.0000 mg | ORAL_TABLET | Freq: Three times a day (TID) | ORAL | 5 refills | Status: DC

## 2022-07-17 NOTE — Telephone Encounter (Signed)
Called pt to relay [name] [name]'s recommendations. Pt verbalized understanding. She will increase her Hydralazine to three time daily as well as call by the end of the week with her blood pressure readings.

## 2022-07-17 NOTE — Telephone Encounter (Signed)
Pt c/o BP issue: STAT if pt c/o blurred vision, one-sided weakness or slurred speech  1. What are your last 5 BP readings? 145/84 Sunday at 12:00 - back pain started  at  that time- she had taken 1 tablet of 25 mg Hydralazine 7:00 PM 25 mg  of Hydralazine and at  7:30 PM 156/78 pulse 69 at 8:PM it was 149/75 pulse 74 9:00 PM it was 167/80 pulse 73 and was in a lot of pain at 9:30 PM it was 153/83 pulse 58, 10:30 PM 163/88 pulse 65 took another 1/2 of Spironolactone  2. Are you having any other symptoms (ex. Dizziness, headache, blurred vision, passed out)? Dizzy, headache- she took a half of Spironolactone when this happen. She also took Meloxicam 7;5 mg for her pain  in her hip   3. What is your BP issue? Blood pressure is running high

## 2022-07-17 NOTE — Telephone Encounter (Signed)
Spoke to the patient, yesterday her blood pressure was elevated. Around 1200 bp ws 145/84, she was experiencing hip pain during this time. Pt stated around 1900 she took 1 Hydralazine 156/79, HR 69, 2100 149/75 HR 74, 2130 153/83 HR 58, 2200 163/88 HR 65, pt did take half spironolactone.  As of this morning, pt stated around 0800 bp 171/83 HR 83 prior to medication, 0900 rechecked bp 149/85 HR 61, bp while on the phone 160/82. Pt stated she is currently taking meloxicam and tramadol for hip pain. Pt stated she is concern to her elevated bp readings. Explained ED precautions,advised pt to contact PCP pertaining to her pain,  pt voiced understanding. Will forward to APP for advise.

## 2022-07-18 NOTE — Telephone Encounter (Signed)
Urology has reviewed MRI results with patient already.

## 2022-07-19 DIAGNOSIS — Z87440 Personal history of urinary (tract) infections: Secondary | ICD-10-CM | POA: Diagnosis not present

## 2022-07-19 DIAGNOSIS — N281 Cyst of kidney, acquired: Secondary | ICD-10-CM | POA: Diagnosis not present

## 2022-07-19 DIAGNOSIS — M25559 Pain in unspecified hip: Secondary | ICD-10-CM | POA: Diagnosis not present

## 2022-07-19 DIAGNOSIS — I10 Essential (primary) hypertension: Secondary | ICD-10-CM | POA: Diagnosis not present

## 2022-07-24 ENCOUNTER — Telehealth: Payer: Self-pay | Admitting: Cardiovascular Disease

## 2022-07-24 DIAGNOSIS — I10 Essential (primary) hypertension: Secondary | ICD-10-CM

## 2022-07-24 MED ORDER — SPIRONOLACTONE 25 MG PO TABS: 50.0000 mg | ORAL_TABLET | Freq: Every day | ORAL | 0 refills | Status: DC

## 2022-07-24 NOTE — Telephone Encounter (Signed)
Closing this encounter. Pt has been addressed via telephone encounter.

## 2022-07-24 NOTE — Telephone Encounter (Signed)
Spoke with pt regarding her elevated blood pressures. Pt states that when she was here to see Carlos Levering, NP on 07/14/22 she was wanting to stop taking spironolactone because of adverse side effects, bladder issues and bladder infections. Pt was switched to hydralazine 25mg  twice daily and then a week after starting the medication she was increased to three times a day. Pt states that blood pressure is still elevated when taking medication (see mychart messages for blood pressure readings). Pt states this morning she took her hydralazine at 7:30am, blood pressure at 9:30am 155/79 HR: 72, at this time pt took a dose of spironalactone 25mg . Pt states that since starting the medication she has had palpitations, dizzy spells, and headaches. Pt doesn't think the medication if working and would like to know what to do. Made pt aware that she has not actually tried this medication for a full 2 weeks. Pt also mentions that she believes that some of the issues with her blood pressure were because she was in pain from a stomach ulcer that now is not causing her issues. Will forward to provider to advise.

## 2022-07-24 NOTE — Telephone Encounter (Signed)
Pt c/o medication issue:  1. Name of Medication: hydrALAZINE (APRESOLINE) 25 MG tablet   2. How are you currently taking this medication (dosage and times per day)? Take 1 tablet (25 mg total) by mouth 3 (three) times daily.   3. Are you having a reaction (difficulty breathing--STAT)? No   4. What is your medication issue? Patient said that she was taken off spirolactone and put on hydralazine but says that medication is not working and BP is still spiking and wanted to see if she can take one or two pills of spirolactone to get it under control

## 2022-07-24 NOTE — Telephone Encounter (Signed)
[name] Levering, NP  You25 minutes ago (12:44 PM)    As the patient and I discussed in her last visit, if she cannot tolerate the hydralazine she can stop it and go back to Spironolactone. Given her multiple intolerances and allergies I would like her to be seen by Pharm D hypertension clinic please. She should continue to keep a log of her BP to bring with her to that visit.  Thank you!  DW   Spoke with pt regarding [name]'s recommendations. Pt will switch back to spironolactone 50mg  once daily starting tomorrow. She will not take any additional doses of hydralazine today. Pt reports her blood pressure now at 140/68, HR: 75. Referral placed for PharmD and appointment scheduled for 7/3 @ 2:30pm. Pt will bring her blood pressure log to this appointment. Medication sent to pt's preferred pharmacy. Pt verbalizes understanding.

## 2022-07-25 ENCOUNTER — Telehealth: Payer: Self-pay | Admitting: Home Health

## 2022-07-25 ENCOUNTER — Encounter: Payer: Medicare Other | Admitting: Orthopaedic Surgery

## 2022-07-25 DIAGNOSIS — I10 Essential (primary) hypertension: Secondary | ICD-10-CM | POA: Diagnosis not present

## 2022-07-25 NOTE — Telephone Encounter (Signed)
Patient called after hour line, states her BP has been elevated all day up to 170s systolic, reports BP 165/83  AP 75 at 6pm, 7am and 430pm she took spironolactone 25mg  twice, she was started on hydralazine recently but felt not tolerating due to heart palpitations, dizziness, and headache, also felt BP was not improving on it and thus stopped it. She denied chest pain, felt she needs to take deep breath, also lightheaded. Advised the patient to go to the nearest ER for in person evaluation as she has numerous complaints, she has agreed.

## 2022-07-26 ENCOUNTER — Ambulatory Visit: Payer: Medicare Other | Admitting: Allergy & Immunology

## 2022-07-26 NOTE — Telephone Encounter (Signed)
See recent telephone call and recent updates.   Thank you!

## 2022-07-27 ENCOUNTER — Ambulatory Visit: Payer: Medicare Other | Admitting: Urology

## 2022-08-03 DIAGNOSIS — R1031 Right lower quadrant pain: Secondary | ICD-10-CM | POA: Diagnosis not present

## 2022-08-03 DIAGNOSIS — R300 Dysuria: Secondary | ICD-10-CM | POA: Diagnosis not present

## 2022-08-07 DIAGNOSIS — R300 Dysuria: Secondary | ICD-10-CM | POA: Diagnosis not present

## 2022-08-14 DIAGNOSIS — N281 Cyst of kidney, acquired: Secondary | ICD-10-CM | POA: Diagnosis not present

## 2022-08-16 ENCOUNTER — Ambulatory Visit: Payer: Medicare Other

## 2022-08-25 ENCOUNTER — Ambulatory Visit: Payer: Medicare Other | Admitting: Physician Assistant

## 2022-08-29 ENCOUNTER — Ambulatory Visit: Payer: Medicare Other | Admitting: Plastic Surgery

## 2022-08-29 ENCOUNTER — Telehealth: Payer: Self-pay | Admitting: Cardiovascular Disease

## 2022-08-29 ENCOUNTER — Encounter: Payer: Self-pay | Admitting: Plastic Surgery

## 2022-08-29 VITALS — BP 164/76 | HR 53 | Ht 64.0 in | Wt 163.8 lb

## 2022-08-29 DIAGNOSIS — Z87891 Personal history of nicotine dependence: Secondary | ICD-10-CM | POA: Diagnosis not present

## 2022-08-29 DIAGNOSIS — Z6828 Body mass index (BMI) 28.0-28.9, adult: Secondary | ICD-10-CM

## 2022-08-29 DIAGNOSIS — N62 Hypertrophy of breast: Secondary | ICD-10-CM

## 2022-08-29 DIAGNOSIS — E785 Hyperlipidemia, unspecified: Secondary | ICD-10-CM

## 2022-08-29 DIAGNOSIS — M545 Low back pain, unspecified: Secondary | ICD-10-CM

## 2022-08-29 DIAGNOSIS — M546 Pain in thoracic spine: Secondary | ICD-10-CM

## 2022-08-29 DIAGNOSIS — M542 Cervicalgia: Secondary | ICD-10-CM

## 2022-08-29 DIAGNOSIS — M549 Dorsalgia, unspecified: Secondary | ICD-10-CM | POA: Insufficient documentation

## 2022-08-29 DIAGNOSIS — F339 Major depressive disorder, recurrent, unspecified: Secondary | ICD-10-CM

## 2022-08-29 DIAGNOSIS — G8929 Other chronic pain: Secondary | ICD-10-CM

## 2022-08-29 DIAGNOSIS — H9193 Unspecified hearing loss, bilateral: Secondary | ICD-10-CM

## 2022-08-29 NOTE — Telephone Encounter (Signed)
Pt c/o BP issue: STAT if pt c/o blurred vision, one-sided weakness or slurred speech  1. What are your last 5 BP readings? .  164/76 hr58   2. Are you having any other symptoms (ex. Dizziness, headache, blurred vision, passed out)? Dizziness   3. What is your BP issue? Pt states she went to see her doctor today and her bp is high. She states she took 25 mg of spironolactone this morning. Please advise.

## 2022-08-29 NOTE — Telephone Encounter (Signed)
Spoke with patient and states her BP was 164/76 hr 58. This has been the highest reading.  She has a log at home with more readings but she is not at home. The readings average around 140's/80's She has only been taking half  25 of her spironolactone in the mornings instead of the full dose of 50 mg. Then take the other 25 mg in the evenings. She denies dizziness, headache or blurred vision. Advised to start taking her full 50 mg dose of spironolactone in the morning and continue to monitor her blood pressure and heart rate. She has appointment with hypertension clinic next month.
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
## 2022-08-29 NOTE — Addendum Note (Signed)
Addended by: Peggye Form on: 08/29/2022 11:07 AM   Modules accepted: Orders

## 2022-08-29 NOTE — Telephone Encounter (Signed)
Routing message to Bernadene Person NP.

## 2022-08-31 ENCOUNTER — Encounter: Payer: Self-pay | Admitting: Plastic Surgery

## 2022-08-31 NOTE — Telephone Encounter (Signed)
Spoke with pt. Pt was notified of recommendations of taking the full dose of Spironolactone 50 mg in the morning. Pt is to monitor BP 2 hours after taking medication (5-10 mins seated rest) and keep follow up apt.

## 2022-09-11 ENCOUNTER — Telehealth: Payer: Self-pay | Admitting: Cardiovascular Disease

## 2022-09-11 NOTE — Telephone Encounter (Signed)
Pt would like a callback regarding BP results. Pt states that she was told to monitor BP readings for a week. Please advise

## 2022-09-11 NOTE — Telephone Encounter (Signed)
Spoke with patient .  She will send copy of BP readings via my chart.

## 2022-09-13 DIAGNOSIS — R300 Dysuria: Secondary | ICD-10-CM | POA: Diagnosis not present

## 2022-09-13 DIAGNOSIS — R030 Elevated blood-pressure reading, without diagnosis of hypertension: Secondary | ICD-10-CM | POA: Diagnosis not present

## 2022-09-13 DIAGNOSIS — M25551 Pain in right hip: Secondary | ICD-10-CM | POA: Diagnosis not present

## 2022-09-13 DIAGNOSIS — R109 Unspecified abdominal pain: Secondary | ICD-10-CM | POA: Diagnosis not present

## 2022-09-17 ENCOUNTER — Other Ambulatory Visit: Payer: Self-pay | Admitting: Cardiovascular Disease

## 2022-09-18 NOTE — Telephone Encounter (Signed)
Called patient about refilling atorvastatin 40 mg because there is a note for patient removal of medication due to her no longer taking it. Patient said she has reached the cholesterol level that Dr. Tresa Endo wanted, so she stop taking it. She also said the medication caused discomfort and if the med isn't helping with her heart she doesn't see the need to continue on it. [name] [name] also said that she hasn't heard anything back about her blood pressure reading she submitted.

## 2022-09-21 ENCOUNTER — Ambulatory Visit: Payer: Medicare Other | Admitting: Urology

## 2022-09-27 ENCOUNTER — Ambulatory Visit: Payer: Medicare Other | Admitting: Allergy & Immunology

## 2022-09-28 ENCOUNTER — Encounter: Payer: Self-pay | Admitting: Internal Medicine

## 2022-10-02 ENCOUNTER — Encounter: Payer: Self-pay | Admitting: Physical Medicine and Rehabilitation

## 2022-10-02 ENCOUNTER — Ambulatory Visit: Payer: Medicare Other | Admitting: Physical Medicine and Rehabilitation

## 2022-10-02 ENCOUNTER — Other Ambulatory Visit (INDEPENDENT_AMBULATORY_CARE_PROVIDER_SITE_OTHER): Payer: Medicare Other

## 2022-10-02 DIAGNOSIS — M545 Low back pain, unspecified: Secondary | ICD-10-CM | POA: Diagnosis not present

## 2022-10-02 DIAGNOSIS — M25551 Pain in right hip: Secondary | ICD-10-CM

## 2022-10-02 DIAGNOSIS — G8929 Other chronic pain: Secondary | ICD-10-CM | POA: Diagnosis not present

## 2022-10-02 DIAGNOSIS — M4186 Other forms of scoliosis, lumbar region: Secondary | ICD-10-CM
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
## 2022-10-02 NOTE — Progress Notes (Unsigned)
Functional Pain Scale - descriptive words and definitions  Moderate (4)   Constantly aware of pain, can complete ADLs with modification/sleep marginally affected at times/passive distraction is of no use, but active distraction gives some relief. Moderate range order  Average Pain  varies  Lower back pain on right side that radiates into the right hip. Hip hurts the most when standing and applying pressure

## 2022-10-03 ENCOUNTER — Telehealth: Payer: Self-pay | Admitting: Plastic Surgery

## 2022-10-03 NOTE — Telephone Encounter (Signed)
Called patient to book sx - she said has lost 10 lbs but will have to delay scheduling due to other health issues - she will call us back

## 2022-10-06 ENCOUNTER — Telehealth: Payer: Self-pay | Admitting: Physical Medicine and Rehabilitation

## 2022-10-06 ENCOUNTER — Other Ambulatory Visit: Payer: Self-pay | Admitting: Physical Medicine and Rehabilitation

## 2022-10-06 MED ORDER — MELOXICAM 15 MG PO TABS: 15.0000 mg | ORAL_TABLET | Freq: Every day | ORAL | 0 refills | Status: DC

## 2022-10-06 NOTE — Telephone Encounter (Signed)
Patient called. Says she is in a lot of pain. Would like something else for pain the Tramadol makes her sleepy. Her call back number is [phone]
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
## 2022-10-09 ENCOUNTER — Telehealth: Payer: Self-pay

## 2022-10-09 DIAGNOSIS — R300 Dysuria: Secondary | ICD-10-CM | POA: Diagnosis not present

## 2022-10-09 DIAGNOSIS — R7301 Impaired fasting glucose: Secondary | ICD-10-CM | POA: Diagnosis not present

## 2022-10-09 NOTE — Telephone Encounter (Signed)
Patient called stating [facility] didn't accept her insurance she wanted to know if a referral can be placed at another facility

## 2022-10-10 ENCOUNTER — Encounter: Payer: Self-pay | Admitting: Student

## 2022-10-10 ENCOUNTER — Ambulatory Visit: Payer: Medicare Other | Attending: Cardiovascular Disease | Admitting: Student

## 2022-10-10 VITALS — BP 156/77 | HR 60

## 2022-10-10 DIAGNOSIS — I10 Essential (primary) hypertension: Secondary | ICD-10-CM

## 2022-10-10 MED ORDER — CLONIDINE HCL 0.1 MG PO TABS: 0.1000 mg | ORAL_TABLET | Freq: Two times a day (BID) | ORAL | 11 refills | Status: DC

## 2022-10-10 MED ORDER — SPIRONOLACTONE 25 MG PO TABS: 25.0000 mg | ORAL_TABLET | Freq: Every day | ORAL | Status: DC

## 2022-10-10 NOTE — Patient Instructions (Addendum)
Changes made by your pharmacist Carmela Hurt, PharmD at today's visit:    Instructions/Changes  (what do you need to do) Your Notes  (what you did and when you did it)  Start taking clonidine 0.1 mg twice daily and reduce dose of spironolactone from 50 mg daily to 25 mg daily    Reduce salt intake     Bring all of your meds, your BP cuff and your record of home blood pressures to your next appointment.    HOW TO TAKE YOUR BLOOD PRESSURE AT HOME  Rest 5 minutes before taking your blood pressure.  Don't smoke or drink caffeinated beverages for at least 30 minutes before. Take your blood pressure before (not after) you eat. Sit comfortably with your back supported and both feet on the floor (don't cross your legs). Elevate your arm to heart level on a table or a desk. Use the proper sized cuff. It should fit smoothly and snugly around your bare upper arm. There should be enough room to slip a fingertip under the cuff. The bottom edge of the cuff should be 1 inch above the crease of the elbow. Ideally, take 3 measurements at one sitting and record the average.  Important lifestyle changes to control high blood pressure  Intervention  Effect on the BP  Lose extra pounds and watch your waistline Weight loss is one of the most effective lifestyle changes for controlling blood pressure. If you're overweight or obese, losing even a small amount of weight can help reduce blood pressure. Blood pressure might go down by about 1 millimeter of mercury (mm Hg) with each kilogram (about 2.2 pounds) of weight lost.  Exercise regularly As a general goal, aim for at least 30 minutes of moderate physical activity every day. Regular physical activity can lower high blood pressure by about 5 to 8 mm Hg.  Eat a healthy diet Eating a diet rich in whole grains, fruits, vegetables, and low-fat dairy products and low in saturated fat and cholesterol. A healthy diet can lower high blood pressure by up to 11 mm  Hg.  Reduce salt (sodium) in your diet Even a small reduction of sodium in the diet can improve heart health and reduce high blood pressure by about 5 to 6 mm Hg.  Limit alcohol One drink equals 12 ounces of beer, 5 ounces of wine, or 1.5 ounces of 80-proof liquor.  Limiting alcohol to less than one drink a day for women or two drinks a day for men can help lower blood pressure by about 4 mm Hg.   If you have any questions or concerns please use My Chart to send questions or call the office at [phone]

## 2022-10-10 NOTE — Assessment & Plan Note (Signed)
Assessment: BP is uncontrolled in office BP 151/76 mmHg, heart rate 60;above the goal (<130/80) Patient thinks spironolactone causing frequent UTI so want to reduce dose to 25 mg  Intolerance to most 1st line agents and does not want to try them again  Previously tried: hydralazine 25 mg - heavy legs and palpitation, HCTZ- N/V dizziness, amlodipine - swelling, lisinopril- excessive mucus production and swelling  Denies SOB, palpitation, chest pain, headaches,or swelling  Follows low salt diet but due to chronic back pain unable to do exercise/walk  Plan:  Reduce dose of spironolactone 25 mg daily  Start taking clonidine 0.1 mg twice daily  Patient to keep record of BP readings with heart rate and report to [place] at the next visit Patient to bring home monitor for validation at next OV  Patient to see PharmD in 4 weeks for follow up  Follow up lab(s): none

## 2022-10-11 ENCOUNTER — Ambulatory Visit: Payer: Medicare Other | Admitting: Orthopedic Surgery

## 2022-10-12 ENCOUNTER — Telehealth: Payer: Self-pay | Admitting: Cardiovascular Disease

## 2022-10-12 NOTE — Telephone Encounter (Signed)
Patient stated clonidine yesterday morning and takes BID. Patient states she is just tired.  She has had episodes in the past but since taking clonidine she is more so.  She fell asleep at 7pm last night and is just so tired.  She is concerned as her HR is low. She ask if there is a rate that she needs to call 911.  She states at 48 she was worried. She has had no symptoms other than fatigue.  Please advise

## 2022-10-12 NOTE — Telephone Encounter (Signed)
Spoke to patient, states her HR went in 48 only once time otherwise it >55 range. She is tired but she can handle tiredness. Advise to lower clonidine dose to 0.05 mg twice daily (1/2 tab of 0.1 mg twice daily) will call tomorrow to see how she does on lower dose.

## 2022-10-12 NOTE — Telephone Encounter (Signed)
New Message:     Patient said she started taking Clonidine on yesterday. She is calling back with the blood pressure readings.    10-11-22-  10:30 AM   110/65 pulse 53     1:00 PM      130/80 pulse 48 10-12-22      9:30 AM   107/67 pulse 46      9:37 AM     107/65 pulse 56    She is concerned about the blood pressure readings and low pulse readings

## 2022-10-13 ENCOUNTER — Other Ambulatory Visit: Payer: Self-pay

## 2022-10-13 ENCOUNTER — Encounter (HOSPITAL_COMMUNITY): Payer: Self-pay | Admitting: Emergency Medicine

## 2022-10-13 ENCOUNTER — Emergency Department (HOSPITAL_COMMUNITY)
Admission: EM | Admit: 2022-10-13 | Discharge: 2022-10-13 | Discharge status: Against Medical Advice | Payer: Medicare Other | Attending: Emergency Medicine | Admitting: Emergency Medicine

## 2022-10-13 DIAGNOSIS — R410 Disorientation, unspecified: Secondary | ICD-10-CM | POA: Diagnosis not present

## 2022-10-13 DIAGNOSIS — R001 Bradycardia, unspecified: Secondary | ICD-10-CM | POA: Diagnosis not present

## 2022-10-13 DIAGNOSIS — Z5321 Procedure and treatment not carried out due to patient leaving prior to being seen by health care provider: Secondary | ICD-10-CM | POA: Diagnosis not present

## 2022-10-13 NOTE — Telephone Encounter (Signed)
Call patient to follow up on BP and clonidine. Patient did not feel good so she went to [facility] got E[facility] done but could not wait till they read result to her. She left ED.  She asked them to forward the result to Dr.Kelly. she could not remember what was her BP or HR this morning  Advised to stop clonidine and go back to spironolactone 50 mg which she was tolerating some what and but was not having effect on BP.  Follow up with PharmD scheduled in 3-4 weeks

## 2022-10-13 NOTE — ED Triage Notes (Signed)
Pt started 1mg  clonidine po for bp Monday decreased to 0.5 mg yesterday. Reports low heart rate and slight confusion-"foggy headed".

## 2022-10-18 DIAGNOSIS — R001 Bradycardia, unspecified: Secondary | ICD-10-CM | POA: Diagnosis not present

## 2022-10-18 DIAGNOSIS — I10 Essential (primary) hypertension: Secondary | ICD-10-CM | POA: Diagnosis not present

## 2022-10-18 DIAGNOSIS — E7849 Other hyperlipidemia: Secondary | ICD-10-CM | POA: Diagnosis not present

## 2022-10-18 DIAGNOSIS — K219 Gastro-esophageal reflux disease without esophagitis: Secondary | ICD-10-CM | POA: Diagnosis not present

## 2022-10-18 DIAGNOSIS — N281 Cyst of kidney, acquired: Secondary | ICD-10-CM | POA: Diagnosis not present

## 2022-10-18 DIAGNOSIS — M25559 Pain in unspecified hip: Secondary | ICD-10-CM | POA: Diagnosis not present

## 2022-10-18 DIAGNOSIS — M25519 Pain in unspecified shoulder: Secondary | ICD-10-CM | POA: Diagnosis not present

## 2022-10-18 DIAGNOSIS — E559 Vitamin D deficiency, unspecified: Secondary | ICD-10-CM | POA: Diagnosis not present

## 2022-10-18 DIAGNOSIS — I25119 Atherosclerotic heart disease of native coronary artery with unspecified angina pectoris: Secondary | ICD-10-CM | POA: Diagnosis not present

## 2022-10-19 ENCOUNTER — Ambulatory Visit
Admission: RE | Admit: 2022-10-19 | Discharge: 2022-10-19 | Disposition: A | Discharge status: Home / Self Care | Payer: Medicare Other | Source: Ambulatory Visit | Attending: Physical Medicine and Rehabilitation | Admitting: Physical Medicine and Rehabilitation

## 2022-10-19 DIAGNOSIS — G8929 Other chronic pain: Secondary | ICD-10-CM

## 2022-10-19 DIAGNOSIS — M545 Low back pain, unspecified: Secondary | ICD-10-CM | POA: Diagnosis not present

## 2022-10-19 DIAGNOSIS — M25551 Pain in right hip: Secondary | ICD-10-CM | POA: Diagnosis not present

## 2022-10-19 DIAGNOSIS — M4186 Other forms of scoliosis, lumbar region: Secondary | ICD-10-CM | POA: Diagnosis not present

## 2022-10-20 ENCOUNTER — Ambulatory Visit (HOSPITAL_COMMUNITY): Payer: Medicare Other | Attending: Physical Medicine and Rehabilitation

## 2022-10-20 ENCOUNTER — Other Ambulatory Visit: Payer: Self-pay

## 2022-10-20 DIAGNOSIS — M4186 Other forms of scoliosis, lumbar region: Secondary | ICD-10-CM | POA: Insufficient documentation

## 2022-10-20 DIAGNOSIS — M5459 Other low back pain: Secondary | ICD-10-CM | POA: Insufficient documentation

## 2022-10-20 DIAGNOSIS — G8929 Other chronic pain: Secondary | ICD-10-CM | POA: Insufficient documentation

## 2022-10-20 DIAGNOSIS — M25551 Pain in right hip: Secondary | ICD-10-CM | POA: Diagnosis not present

## 2022-10-20 DIAGNOSIS — M545 Low back pain, unspecified: Secondary | ICD-10-CM | POA: Insufficient documentation

## 2022-10-20 DIAGNOSIS — M4127 Other idiopathic scoliosis, lumbosacral region: Secondary | ICD-10-CM | POA: Insufficient documentation
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
## 2022-10-23 ENCOUNTER — Encounter (HOSPITAL_COMMUNITY): Payer: Self-pay

## 2022-10-23 NOTE — Therapy (Signed)
.    PHYSICAL THERAPY DISCHARGE SUMMARY  Visits from Start of Care: 1  Current functional level related to goals / functional outcomes: N/a    Remaining deficits: N/a   Education / Equipment: N/a   Patient agrees to discharge. Patient goals were not met. Patient is being discharged due to the patient's request.     Patient Name: [name] [name] MRN: [id number] DOB:[birth year], 72 y.o., female Today's Date: 10/23/2022   PCP: Estanislado Pandy, MDPCP - General   REFERRING PROVIDER: Nevin Bloodgood Provider   REFERRING DIAG:  M54.50,G89.29 (ICD-10-CM) - Chronic bilateral low back pain without sciatica  M25.551 (ICD-10-CM) - Pain in right hip  M41.86 (ICD-10-CM) - Levoscoliosis of lumbar spine    Rationale for Evaluation and Treatment: Rehabilitation  THERAPY DIAG:  No diagnosis found.  ASSESSMENT:  CLINICAL IMPRESSION: Patient is a 72 y.o. y.o. female who was seen today for physical therapy evaluation and treatment for low back and right hip pain, scoliosis L2L3. Patient presents to PT with the following objective impairments: decreased endurance, difficulty walking, decreased strength, and pain. These impairments limit the patient in activities such as carrying, lifting, bending, standing, squatting, sleeping, stairs, and locomotion level. These impairments also limit the patient in participation such as meal prep, cleaning, laundry, driving, shopping, and yard work. The patient will benefit from PT to address the limitations/impairments listed below to return to their prior level of function in the domains of activity and participation.    Seymour Bars, PT 10/23/2022, 4:09 PM

## 2022-10-26 ENCOUNTER — Ambulatory Visit: Payer: Medicare Other | Admitting: Urology

## 2022-10-31 ENCOUNTER — Encounter (HOSPITAL_COMMUNITY): Payer: Medicare Other

## 2022-11-06 ENCOUNTER — Telehealth: Payer: Self-pay | Admitting: Physical Medicine and Rehabilitation

## 2022-11-06 ENCOUNTER — Telehealth: Payer: Self-pay

## 2022-11-06 NOTE — Telephone Encounter (Signed)
Spoke with patient and scheduled OV for 11/07/22.

## 2022-11-06 NOTE — Telephone Encounter (Signed)
Spoke with patient and rescheduled OV for 11/17/22.

## 2022-11-06 NOTE — Telephone Encounter (Signed)
Patient called needing to reschedule her appointment. The number to contact patient is [phone]

## 2022-11-06 NOTE — Telephone Encounter (Signed)
-----   Message from [place], [place] E sent at 11/06/2022  8:07 AM EDT ----- Please schedule lumbar MRI review. ----- Message ----- From: Interface, Rad Results In Sent: 11/05/2022   9:06 AM EDT To: Juanda Chance, NP

## 2022-11-07 ENCOUNTER — Ambulatory Visit: Payer: Medicare Other | Admitting: Physical Medicine and Rehabilitation

## 2022-11-10 ENCOUNTER — Ambulatory Visit: Payer: Medicare Other

## 2022-11-15 ENCOUNTER — Encounter (HOSPITAL_COMMUNITY): Payer: Medicare Other

## 2022-11-17 ENCOUNTER — Ambulatory Visit: Payer: Medicare Other | Admitting: Physical Medicine and Rehabilitation

## 2022-11-17 ENCOUNTER — Encounter: Payer: Self-pay | Admitting: Physical Medicine and Rehabilitation

## 2022-11-17 DIAGNOSIS — G8929 Other chronic pain: Secondary | ICD-10-CM | POA: Diagnosis not present

## 2022-11-17 DIAGNOSIS — M545 Low back pain, unspecified: Secondary | ICD-10-CM | POA: Diagnosis not present

## 2022-11-17 DIAGNOSIS — M546 Pain in thoracic spine: Secondary | ICD-10-CM | POA: Diagnosis not present

## 2022-11-17 DIAGNOSIS — M7918 Myalgia, other site: Secondary | ICD-10-CM

## 2022-11-17 NOTE — Progress Notes (Unsigned)
Functional Pain Scale - descriptive words and definitions  Uncomfortable (3)  Pain is present but can complete all ADL's/sleep is slightly affected and passive distraction only gives marginal relief. Mild range order  Average Pain  varies  Lower back pain. MRI review. Medication helps pain
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
## 2022-11-22 DIAGNOSIS — R300 Dysuria: Secondary | ICD-10-CM | POA: Diagnosis not present

## 2022-11-23 ENCOUNTER — Ambulatory Visit: Payer: Medicare Other | Admitting: Surgical

## 2022-11-23 ENCOUNTER — Encounter: Payer: Self-pay | Admitting: Surgical

## 2022-11-23 ENCOUNTER — Telehealth: Payer: Self-pay

## 2022-11-23 VITALS — BP 148/70 | HR 61

## 2022-11-23 DIAGNOSIS — M546 Pain in thoracic spine: Secondary | ICD-10-CM

## 2022-11-23 DIAGNOSIS — G8929 Other chronic pain: Secondary | ICD-10-CM

## 2022-11-23 DIAGNOSIS — N62 Hypertrophy of breast: Secondary | ICD-10-CM

## 2022-11-23 DIAGNOSIS — M542 Cervicalgia: Secondary | ICD-10-CM

## 2022-11-23 MED ORDER — TRAMADOL HCL 50 MG PO TABS: 50.0000 mg | ORAL_TABLET | Freq: Four times a day (QID) | ORAL | 0 refills | Status: AC | PRN

## 2022-11-23 MED ORDER — CEPHALEXIN 500 MG PO CAPS: 500.0000 mg | ORAL_CAPSULE | Freq: Four times a day (QID) | ORAL | 0 refills | Status: AC

## 2022-11-23 MED ORDER — ONDANSETRON HCL 4 MG PO TABS: 4.0000 mg | ORAL_TABLET | Freq: Three times a day (TID) | ORAL | 0 refills | Status: DC | PRN
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
## 2022-11-23 NOTE — Telephone Encounter (Signed)
Faxed Request for Surgical Clearance form to:  [facility]: [phone] with confirmed receipt [facility]: [phone] with confirmed receipt.

## 2022-11-24 ENCOUNTER — Telehealth: Payer: Self-pay

## 2022-11-24 NOTE — Telephone Encounter (Signed)
Pre-operative Risk Assessment    Patient Name: [name] [name]  DOB: 10-Feb-1951 MRN: [id number]     Request for Surgical Clearance    Procedure:  BL breast reduction   Date of Surgery:  Clearance 12/21/22                                 Surgeon:  Dr. Foster Simpson  Surgeon's Group or Practice Name:  [facility]  Phone number:  [phone] Fax number:  [phone]   Type of Clearance Requested:   - Medical    Type of Anesthesia:  Not Indicated   Additional requests/questions:    SignedVernard Gambles   11/24/2022, 4:12 PM

## 2022-11-25 DIAGNOSIS — Z23 Encounter for immunization: Secondary | ICD-10-CM | POA: Diagnosis not present

## 2022-11-27 DIAGNOSIS — M25559 Pain in unspecified hip: Secondary | ICD-10-CM | POA: Diagnosis not present

## 2022-11-27 DIAGNOSIS — E7849 Other hyperlipidemia: Secondary | ICD-10-CM | POA: Diagnosis not present

## 2022-11-27 DIAGNOSIS — I25119 Atherosclerotic heart disease of native coronary artery with unspecified angina pectoris: Secondary | ICD-10-CM | POA: Diagnosis not present

## 2022-11-27 DIAGNOSIS — I10 Essential (primary) hypertension: Secondary | ICD-10-CM | POA: Diagnosis not present

## 2022-11-27 DIAGNOSIS — K219 Gastro-esophageal reflux disease without esophagitis: Secondary | ICD-10-CM | POA: Diagnosis not present

## 2022-11-27 DIAGNOSIS — M25519 Pain in unspecified shoulder: Secondary | ICD-10-CM | POA: Diagnosis not present

## 2022-11-27 DIAGNOSIS — E559 Vitamin D deficiency, unspecified: Secondary | ICD-10-CM | POA: Diagnosis not present

## 2022-11-27 DIAGNOSIS — R001 Bradycardia, unspecified: Secondary | ICD-10-CM | POA: Diagnosis not present

## 2022-11-27 DIAGNOSIS — N281 Cyst of kidney, acquired: Secondary | ICD-10-CM | POA: Diagnosis not present

## 2022-11-27 NOTE — Telephone Encounter (Signed)
S/w the pt and she stated she cancelled her surgery right now as she is having some back issues and needs to take care of her back first. I will update all parties involved.

## 2022-11-27 NOTE — Telephone Encounter (Signed)
Primary Cardiologist:Thomas Tresa Endo, MD   Preoperative team, please contact this patient and set up a phone call appointment for further preoperative risk assessment. Please obtain consent and complete medication review. Thank you for your help.   I confirm that guidance regarding antiplatelet and oral anticoagulation therapy has been completed and, if necessary, noted below (none requested)  I also confirmed the patient resides in the state of [place]. As per [facility] Medical Board telemedicine laws, the patient must reside in the state in which the provider is licensed.   Levi Aland, NP-C 11/27/2022, 10:56 AM [address] Office [phone] Fax [phone]

## 2022-11-28 ENCOUNTER — Telehealth: Payer: Self-pay | Admitting: Cardiovascular Disease

## 2022-11-28 ENCOUNTER — Other Ambulatory Visit: Payer: Self-pay | Admitting: Obstetrics and Gynecology

## 2022-11-28 DIAGNOSIS — Z1231 Encounter for screening mammogram for malignant neoplasm of breast: Secondary | ICD-10-CM

## 2022-11-28 NOTE — Telephone Encounter (Signed)
Patient called and said that she wants Dr. Tresa Endo to look over her MRI's and CT's so that she can get his input on them.

## 2022-11-28 NOTE — Telephone Encounter (Signed)
Will route to Dr. Tresa Endo.

## 2022-11-29 ENCOUNTER — Encounter: Payer: Self-pay | Admitting: Cardiovascular Disease

## 2022-11-30 DIAGNOSIS — M5416 Radiculopathy, lumbar region: Secondary | ICD-10-CM | POA: Diagnosis not present

## 2022-12-06 ENCOUNTER — Telehealth: Payer: Self-pay | Admitting: Cardiovascular Disease

## 2022-12-06 NOTE — Telephone Encounter (Signed)
Called pt, lm for her to make appt to discuss with TK her concerns

## 2022-12-06 NOTE — Telephone Encounter (Signed)
Called, no answer. Lvmtcb for appt. Sent mychart message as well.

## 2022-12-06 NOTE — Telephone Encounter (Signed)
Patient returned staff call. 

## 2022-12-07 ENCOUNTER — Ambulatory Visit: Payer: Medicare Other | Admitting: Urology

## 2022-12-07 NOTE — Telephone Encounter (Signed)
Returned pt call, na

## 2022-12-18 ENCOUNTER — Ambulatory Visit (AMBULATORY_SURGERY_CENTER): Payer: Medicare Other

## 2022-12-18 VITALS — Ht 64.0 in | Wt 165.0 lb

## 2022-12-18 DIAGNOSIS — Z860101 Personal history of adenomatous and serrated colon polyps: Secondary | ICD-10-CM

## 2022-12-18 DIAGNOSIS — K219 Gastro-esophageal reflux disease without esophagitis: Secondary | ICD-10-CM

## 2022-12-18 DIAGNOSIS — Z8719 Personal history of other diseases of the digestive system: Secondary | ICD-10-CM

## 2022-12-18 MED ORDER — NA SULFATE-K SULFATE-MG SULF 17.5-3.13-1.6 GM/177ML PO SOLN
1.0000 | Freq: Once | ORAL | 0 refills | Status: AC
Start: 2022-12-18 — End: 2022-12-18

## 2022-12-18 NOTE — Progress Notes (Signed)
No egg or soy allergy known to patient  No issues known to pt with past sedation with any surgeries or procedures Patient denies ever being told they had issues or difficulty with intubation  No FH of Malignant Hyperthermia Pt is not on diet pills Pt is not on  home 02  Pt is not on blood thinners  Pt reports chronic constipation along with diarrhea intermittently  No A fib or A flutter. Pt has hx of bradycardia.  Have any cardiac testing pending-- no  LOA: independent  Prep: suprep   Patient's chart reviewed by Cathlyn Parsons CNRA prior to previsit and patient appropriate for the [facility].  Previsit completed and red dot placed by patient's name on their procedure day (on provider's schedule).     PV competed with patient. Prep instructions sent via mychart and home address. Goodrx coupon for [facility]  provided to use for price reduction if needed.

## 2022-12-18 NOTE — Telephone Encounter (Signed)
Appt scheduled. Pt verbalized understanding and agreed to appt date and time.

## 2022-12-20 ENCOUNTER — Ambulatory Visit: Payer: Medicare Other | Attending: Cardiovascular Disease | Admitting: Cardiovascular Disease

## 2022-12-20 ENCOUNTER — Encounter: Payer: Self-pay | Admitting: Cardiovascular Disease

## 2022-12-20 DIAGNOSIS — E785 Hyperlipidemia, unspecified: Secondary | ICD-10-CM | POA: Diagnosis not present

## 2022-12-20 DIAGNOSIS — Z951 Presence of aortocoronary bypass graft: Secondary | ICD-10-CM | POA: Diagnosis not present

## 2022-12-20 DIAGNOSIS — I700 Atherosclerosis of aorta: Secondary | ICD-10-CM

## 2022-12-20 DIAGNOSIS — I10 Essential (primary) hypertension: Secondary | ICD-10-CM

## 2022-12-20 DIAGNOSIS — I2510 Atherosclerotic heart disease of native coronary artery without angina pectoris: Secondary | ICD-10-CM | POA: Diagnosis not present

## 2022-12-20 DIAGNOSIS — M546 Pain in thoracic spine: Secondary | ICD-10-CM | POA: Diagnosis not present

## 2022-12-20 DIAGNOSIS — N281 Cyst of kidney, acquired: Secondary | ICD-10-CM | POA: Diagnosis not present

## 2022-12-20 DIAGNOSIS — G8929 Other chronic pain: Secondary | ICD-10-CM | POA: Diagnosis not present

## 2022-12-20 MED ORDER — SPIRONOLACTONE 25 MG PO TABS: 25.0000 mg | ORAL_TABLET | Freq: Every day | ORAL | 3 refills | Status: DC

## 2022-12-20 MED ORDER — LOSARTAN POTASSIUM 25 MG PO TABS: 25.0000 mg | ORAL_TABLET | Freq: Every day | ORAL | 3 refills | Status: DC

## 2022-12-20 NOTE — Progress Notes (Signed)
Patient ID: [name] [name], female   DOB: 1950-08-17, 72 y.o.   MRN: [id number]       HPI: [name] [name] is a 72 y.o. female who presents to the office today for a 7 month follow up cardiology evaluation.  [name] has CAD and underwent cardiac catheterization on 05/22/2011 which revealed 70% distal left main stenosis, 60% ostial circumflex stenosis and mild RCA disease.  She underwent CABG surgery x2 by Dr. Morton Peters and had a LIMA placed to her LAD, and vein to the OM 2 vessel.  Ejection fraction was 50-55%.  In October 2014, she  noticed some vague chest fullness and underwent a nuclear perfusion study which demonstrated hyperdynamic LV with an EF of 83% post stress.  She did not have ECG changes; she had normal perfusion and function.  When I saw her in October 2016  she has been without chest pain.  She has a history of vitamin D insufficiency in the past which had improved.  She has issues with muscle spasm for which and was taking Flexeril on an as-needed basis.  She has GERD for which he takes Protonix on an as-needed basis.  She has been maintained on Lipitor 10 mg for hyperlipidemia.  She had issues with diarrhea.  At that time had stopped her losartan.   She underwent an echo Doppler study in October 2016 which showed an EF of 65-70% with grade 1 diastolic dysfunction.  There was mild TR, and otherwise no significant valvular pathology.  She is unaware of any palpitations.  She denies PND, orthopnea.  She had blood work one month ago by her primary physician and her TSH was 1.5.  BUN and creatinine were 15 and 0.73, respectively.  LFTs were normal, total cholesterol 174 with triglycerides 115, HDL 68, LDL 83.    When I saw her in October 2018 she was having issues with insomnia and was concerned that this could be secondary to losartan.  She was using Benadryl to help her sleep.  Typically she has been going to bed at 9:30, but typically may stay awake and total 1 AM.  If she goes to bed at 1 AM  she falls asleep much more quickly.  She has noticed poor sleep, frequent awakenings, daytime fatigue, and snoring.  She has been sleeping with the head of the bed elevated.  She has an extreme overbite.  During that evaluation, I recommended a trial of zolpidem and to help improve sleep initiation and recommend she undergo a sleep study.  I had not seen her since October 2018 and subsequently saw her in February 2021. In the interim she had  been evaluated Corine Shelter, PA-C, Rennis Harding, NP and recently by Dr. Diona Browner.  In the past, her losartan was discontinued due to complaints of swelling in her throat.  She was started on hydralazine but ultimately developed issues with hydralazine leading to its discontinuance.  More recently she was started on spironolactone and was concerned perhaps that this may be contributing to swelling.  She ultimately underwent a home sleep study in January 2021 which demonstrated mild overall sleep apnea with an AHI of 5.3/h.  Oxygen nadir was 88%.  Because of concerns of her blood pressure medication I saw her on March 20, 2019 for follow-up evaluation with me and has requested that I continue to follow her cardiologically.  During that evaluation, her blood pressure was elevated and due to intolerances to numerous medications I added doxazosin initially 1  mg at bedtime for 2 weeks with potential titration to 2 mg.  I also discussed options of CPAP therapy versus oral appliance.  She wished to pursue a customized oral appliance as initial treatment and I referred her to Dr. Myrtis Ser for evaluation.  I recommended aggressive lipid therapy with target LDL less than 70.  I saw her in June 2021 and over the prior several months her leg swelling had improved but she had developed pain in the back of her calf particularly on the right with cramping. She denied any shortness of breath.  She denied palpitations or chest pain.  Subsequent to that evaluation she had seen Dr. Yancey Flemings of  GI for GERD and dyspepsia.  I saw in November 25, 2019.  At that time, she denied any chest pain.  She never followed up for oral appliance evaluation.  She does not believe she is sleeping exceptionally well.  She continues to be on atorvastatin for hyperlipidemia, pantoprazole for GERD and is on spironolactone for blood pressure and swelling.  She now sees Dr. Nadene Rubins at [facility] for primary care.    I last saw [name] on December 22, 2020.  At that time she was admitting to experiencing vague chest pressure occurring intermittently.  She was able to walk.  She had issues with a UTI and required antibiotic therapy most recently with Augmentin.    She has been sleeping with her head propped up.  She admits to daytime fatigue.  She underwent a echo Doppler study on November 30, 2020 which showed an EF of 60 to 65% with grade 1 diastolic dysfunction.  Laboratory in May 2022 had shown an LDL cholesterol at 83 and with her known CAD I recommended further titration of atorvastatin from 20 mg to 40 mg with target LDL less than 70.  She continues to be on chlorthalidone 12.5 mg and spironolactone 25 mg twice a day.  With her occasional chest pressure and known CAD 9 years status post CABG revascularization I recommended she undergo a Lexiscan Myoview study for further evaluation and also discussed the possibility of reassessment of her sleep apnea since she continues to have issues with sleep.  She underwent a Lexiscan Myoview study on December 28, 2020 which was normal.  Presently, she states she has had some issues with blood pressure lability and her primary care provider had recently started losartan.  Apparently she is no longer seeing Dr. Rebecca Eaton and is now seeing Dr. Mayford Knife at [facility] family medicine in [place].   She has had some issues with arthritis of her left shoulder for which she has taken tramadol one half of a 50 mg pill on a as needed basis.  Presently she is on atorvastatin 40 mg daily  for hyperlipidemia, and  spironolactone 25 mg daily.  Remotely, she had been on losartan.  I last saw her, she presented to the emergency room in April with hematuria.  She ultimately had seen Dr. Bjorn Pippin who referred her for renal stone study which showed increase in size of an exophytic cyst in the lower pole of the left kidney, small hiatal hernia, and there was evidence for atherosclerotic calcification and aortic branch vessels without aneurysm.  An MRI revealed revealed large renal cysts in the mid left kidney with fluid signal.  Her abdominal aorta was normal caliber.  She had benign hepatic cysts.  He also has been evaluated by Carlyon Shadow, NP and also saw Marjorie Smolder, our pharmacist.  She was  started on a trial of clonidine which she did not tolerate.  Presently, Ms. Tahir feels well.  She has had issues with chronic low back pain.  She had been tentatively scheduled to undergo breast reduction surgery tomorrow but canceled this since her back pain improved.  He is scheduled to go to [facility] tomorrow to have follow-up laboratory per hematology who is following her previously we elevated platelet count.  Is any chest pain or shortness of breath.  Her last nuclear perfusion study was 2 years ago which remained normal.  She was concerned about the MRI stating there was aortic calcification.  She presents for evaluation.    Past Surgical History:  Procedure Laterality Date   ABDOMINAL HYSTERECTOMY  2003   BONE BIOPSY     COLONOSCOPY  2019   Dr. Marina Goodell   CORONARY ARTERY BYPASS GRAFT  05/26/2011   Procedure: CORONARY ARTERY BYPASS GRAFTING (CABG);  Surgeon: Kerin Perna, MD;  Location: [facility] OR;  Service: Open Heart Surgery;  Laterality: N/A;  Coronary Artery Bypass Graft on Pump times two utlizing left internal mammary artery and right greater saphenous vein harvested endoscopically    DIAG[facility]STIC [facility]MOGRAM  [facility]   LEFT HEART CATHETERIZATION WITH CORONARY  ANGIOGRAM N/A 05/24/2011   Procedure: LEFT HEART CATHETERIZATION WITH CORONARY ANGIOGRAM;  Surgeon: Marykay Lex, MD;  Location: [facility] CATH LAB;  Service: Cardiovascular;  Laterality: N/A;   TONSILLECTOMY  1962   TUBAL LIGATION      Allergies  Allergen Reactions   Trimethoprim Shortness Of Breath   Crestor [Rosuvastatin]     myalgias   Eplerenone     fatigued   Hydrochlorothiazide Nausea And Vomiting    dizzy   Amlodipine Swelling   Cetirizine & Related Other (See Comments)    Extreme Drowsiness - "knocks me loopy for 2 days"   Latex Rash   Levofloxacin Nausea And Vomiting   Lisinopril Other (See Comments)    Excessive mucus production, swelling   Prednisone Other (See Comments)    High dose - hallucinations   Tape Rash    Current Outpatient Medications  Medication Sig Dispense Refill   aspirin (ASPIRIN LOW DOSE) 81 MG chewable tablet      atorvastatin (LIPITOR) 40 MG tablet Take 40 mg by mouth daily.     conjugated estrogens (PREMARIN) vaginal cream Place 1 applicator vaginally once a week.     diclofenac Sodium (VOLTAREN) 1 % GEL Apply 2 g topically as needed.     famotidine (PEPCID) 20 MG tablet Take 20-40 mg by mouth as needed for heartburn or indigestion.     fluconazole (DIFLUCAN) 150 MG tablet Take 150 mg by mouth daily.     losartan (COZAAR) 25 MG tablet Take 1 tablet (25 mg total) by mouth daily. 90 tablet 3   meloxicam (MOBIC) 15 MG tablet Take 1 tablet (15 mg total) by mouth daily. 30 tablet 0   pregabalin (LYRICA) 25 MG capsule Take 25-75 mg by mouth at bedtime.     spironolactone (ALDACTONE) 25 MG tablet Take 1 tablet (25 mg total) by mouth daily. 90 tablet 3   traMADol (ULTRAM) 50 MG tablet Take 25 mg by mouth as needed.     Vitamin D-Vitamin K (VITAMIN K2-VITAMIN D3 PO) Take by mouth.     No current facility-administered medications for this visit.    Social History   Socioeconomic History   Marital status: Divorced    Spouse name: single  Number of  children: 1   Years of education: Not on file   Highest education level: Not on file  Occupational History   Occupation: Retired   Tobacco Use   Smoking status: Former    Current packs/day: 0.00    Average packs/day: 0.5 packs/day for 15.0 years (7.5 ttl pk-yrs)    Types: Cigarettes    Start date: 05/23/1991    Quit date: 05/23/2006    Years since quitting: 16.5   Smokeless tobacco: Never  Vaping Use   Vaping status: Never Used  Substance and Sexual Activity   Alcohol use: Yes    Alcohol/week: 0.0 standard drinks of alcohol    Comment: rare glass of wine   Drug use: No   Sexual activity: Not Currently    Birth control/protection: Post-menopausal  Other Topics Concern   Not on file  Social History Narrative   Diet:  Fish and veggies - does not eat a lot of red meat   Do you drink/eat things with caffeine?  2 cups a day   Marital status:  Divorced   Do you live in a house, apartment, assisted living, condo, trailer, etc.)? Renting a one-story house   Is it one or more stories?  One   How many persons live in your home?  Lives alone   Do you have any pets in your home? (please list)  One dog   Education:  Some college   Current or past profession:  CNA   Do you exercise:  Yes - Walks 2-3 times a week.      ADVANCED DIRECTIVES  None.  Does want CPR.       FUNCTIONAL STATUS   Has no difficulties with ADLs.   Social Determinants of Health   Financial Resource Strain: Not on file  Food Insecurity: Not on file  Transportation Needs: Not on file  Physical Activity: Not on file  Stress: Not on file  Social Connections: Not on file  Intimate Partner Violence: Not on file    Family History  Problem Relation Age of Onset   Mental illness Mother    Alcohol abuse Mother    Drug abuse Sister    Cancer Brother        head and neck cancer   Alcohol abuse Sister    Pancreatitis Sister    Hypertension Daughter    Fibroids Daughter    Lung cancer Maternal Grandmother    Heart  disease Maternal Grandfather    Colon cancer Neg Hx    Breast cancer Neg Hx    Esophageal cancer Neg Hx    Rectal cancer Neg Hx    Stomach cancer Neg Hx     ROS General: Negative; No fevers, chills, or night sweats HEENT: Negative; No changes in vision or hearing, sinus congestion, difficulty swallowing Pulmonary: Negative; No cough, wheezing, shortness of breath, hemoptysis Cardiovascular: See HPI: No chest pain, presyncope, syncope, palpatations GI: Positive for GERD, evaluated by Dr. Marina Goodell;  No nausea, vomiting, diarrhea, or abdominal pain GU: Positive for cyst on kidney; No dysuria, hematuria, or difficulty voiding Musculoskeletal: Positive for occasional muscle spasm; left shoulder discomfort Hematologic: Negative; no easy bruising, bleeding Endocrine: Negative; no heat/cold intolerance; no diabetes, Neuro: Negative; no changes in balance, headaches Skin: Negative; No rashes or skin lesions Psychiatric: Negative; No behavioral problems, depression Sleep: Positive for mild sleep apnea, snoring,  negative; no bruxism, restless legs, hypnogognic hallucinations. Other comprehensive 14 point system review is negative   Physical Exam  BP (!) 144/66 (BP Location: Left Arm, Patient Position: Sitting, Cuff Size: Normal)   Pulse 63   Ht 5\' 4"  (1.626 m)   Wt 167 lb (75.8 kg)   SpO2 97%   BMI 28.67 kg/m    Repeat blood pressure by me was 140 70.  Her blood pressure at home typically runs around  140-145 systolically with diastolic around 70.  Wt Readings from Last 3 Encounters:  12/20/22 167 lb (75.8 kg)  12/18/22 165 lb (74.8 kg)  10/13/22 163 lb (73.9 kg)   General: Alert, oriented, no distress.  Skin: normal turgor, no rashes, warm and dry HEENT: Normocephalic, atraumatic; sclera anicteric; extraocular muscles intact;  Nose without nasal septal hypertrophy Mouth/Parynx benign; Mallinpatti scale 3 Neck: No JVD, no carotid bruits; normal carotid upstroke Lungs: clear to  ausculatation and percussion; no wheezing or rales Chest wall: without tenderness to palpitation Heart: PMI not displaced, RRR, s1 s2 normal, 1/6 systolic murmur, no diastolic murmur, no rubs, gallops, thrills, or heaves Abdomen: soft, nontender; no hepatosplenomehaly, BS+; abdominal aorta nontender and not dilated by palpation. Back: no CVA tenderness Pulses 2+ Musculoskeletal: full range of motion, normal strength, no joint deformities Extremities: no clubbing cyanosis or edema, Homan's sign negative  Neurologic: grossly nonfocal; Cranial nerves grossly wnl Psychologic: Normal mood and affect   EKG Interpretation Date/Time:  Wednesday December 20 2022 10:24:20 EST Ventricular Rate:  61 PR Interval:  142 QRS Duration:  72 QT Interval:  400 QTC Calculation: 402 R Axis:   82  Text Interpretation: Normal sinus rhythm T wave abnormality, consider anterior ischemia When compared with ECG of 13-Oct-2022 10:46, T wave inversion now evident in Anterior leads probably contributed by baseline wander Confirmed by Nicki Guadalajara ([id number]) on 12/20/2022 11:08:57 AM    March 21, 2022 ECG (independently read by me): Sinus bradycardia at 57, isolalated artifact  December 22, 2020 ECG (independently read by me): Sinus bradycardia at 56, nonspecific STT changes  November 25, 2019 ECG (independently read by me): NSR at 63; nonspecific ST changes    July 21, 2019 ECG (independently read by me): Sinus bradycardia at 55; nondiagnostic T wave abnormality in V1-2  04/09/2019 ECG (independently read by me): Sinus Bradycardia at 57; normal intervals  October 2018 ECG (independently read by me): Sinus bradycardia at 58 bpm.  Nonspecific ST-T changes.  QTc interval 394 ms.  PR interval 140 ms.  October 2016 ECG (independently read by me): Sinus bradycardia 53 bpm.  No ectopy.  Normal intervals.  August 2015 ECG (independently read by me): Sinus bradycardia at 47 beats per minute.  Nonspecific T changes.  QTc  interval   385 ms  LABS:    Latest Ref Rng & Units 06/04/2022   12:23 PM 05/25/2021   11:11 AM 11/16/2020    2:57 PM  BMP  Glucose 70 - 99 mg/dL 97  99  89   BUN 8 - 23 mg/dL 18  11  17    Creatinine 0.44 - 1.00 mg/dL 2.13  0.86  5.78   BUN/Creat Ratio 12 - 28  14  17    Sodium 135 - 145 mmol/L 136  140  142   Potassium 3.5 - 5.1 mmol/L 3.5  4.8  4.8   Chloride 98 - 111 mmol/L 102  104  102   CO2 22 - 32 mmol/L 25  25  21    Calcium 8.9 - 10.3 mg/dL 8.6  9.8  9.6       Latest Ref Rng &  Units 06/04/2022   12:23 PM 05/25/2021   11:11 AM 11/04/2020    7:17 PM  Hepatic Function  Total Protein 6.5 - 8.1 g/dL 6.8  5.9  7.0   Albumin 3.5 - 5.0 g/dL 3.9  4.3  4.2   AST 15 - 41 U/L 16  19  19    ALT 0 - 44 U/L 15  19  18    Alk Phosphatase 38 - 126 U/L 76  92  87   Total Bilirubin 0.3 - 1.2 mg/dL 0.5  0.3  0.2       Latest Ref Rng & Units 06/14/2022   10:03 AM 06/04/2022   12:23 PM 12/12/2021    8:20 AM  CBC  WBC 4.0 - 10.5 K/uL 9.9  10.2  7.5   Hemoglobin 12.0 - 15.0 g/dL 16.1  09.6  04.5   Hematocrit 36.0 - 46.0 % 44.0  42.6  42.6   Platelets 150 - 400 K/uL 461  425  390    Lab Results  Component Value Date   MCV 87.1 06/14/2022   MCV 86.1 06/04/2022   MCV 85.0 12/12/2021   Lab Results  Component Value Date   TSH 1.030 05/25/2021   Lab Results  Component Value Date   HGBA1C 5.7 (H) 05/25/2021   Lipid Panel     Component Value Date/Time   CHOL 120 05/25/2021 1111   TRIG 68 05/25/2021 1111   HDL 50 05/25/2021 1111   CHOLHDL 2.4 05/25/2021 1111   CHOLHDL 2.3 11/18/2014 0928   VLDL 15 11/18/2014 0928   LDLCALC 56 05/25/2021 1111   RADIOLOGY: No results found.  IMPRESSION:  1. Essential hypertension   2. Coronary artery disease involving native heart without angina pectoris, unspecified vessel or lesion type   3. S/P CABG x 2   4. Aortic atherosclerosis (HCC)   5. Hyperlipidemia LDL goal <55   6. Chronic bilateral thoracic back pain   7. Renal cyst      ASSESSMENT AND PLAN: Ms. Nesha Counihan is a very pleasant 72 year old female who underwent CABG revascularization surgery in April 2013 after she was found to have high-grade left main stenosis.  A  2014 nuclear study showed normal perfusion and hyperdynamic LV function.  She is not having any anginal symptomatology.  She has had numerous intolerances to medications and reportedly remotely may not have tolerated losartan, hydralazine, amlodipine, lisinopril.  She was subsequently started on spironolactone.  Ever, retrospectively, she now feels that she tolerated the losartan well in the increased mucus secretion in her throat most likely was not related to that.  When I saw her in June 2021, she had complaints of lower extremity discomfort.  A venous Doppler study was negative for DVT.  Her most recent echo Doppler study from November 30, 2020 menstruated normal LV function with EF at 60 to 65% with grade 1 diastolic dysfunction.  She has experienced some episodes of vague chest pressure not typically exertionally precipitated.  She has continued issues with daytime fatigability and sleeps with her head propped up.  Laboratory in May 2022 had shown an LDL cholesterol at 83 with total cholesterol 157 and triglycerides 85.  With her known CAD I have recommended further titration of atorvastatin from 20 mg to 40 mg daily with target LDL less than 70.  Due to vague episodes of chest pain she underwent a Lexiscan Myoview study on December 28, 2020 which showed normal perfusion without scar or ischemia.  She tells me her primary  physician recently reinstated losartan and she has been taking 50 mg and tolerating this well.  She also is on spironolactone at 25 mg daily and is in need for future refills.   Most recently she is now taking spironolactone 50 mg.  I reviewed her recent MRI and CT images evaluation with Dr. Bjorn Pippin.  He has a left renal cyst measuring 7.7 x 7.6 cm with simple fluid signal.  Her imaging  studies have also demonstrated normal aorta size without dilatation but evidence for mild calcification.  She has been noting blood pressure in the 1 40-1 45 systolic range at home.  I discussed with her possibly resuming losartan therapy.  She will reinitiate at a lower dose of just 25 mg and initially will reduce her spironolactone back to 25 mg daily.  However, if blood pressure remains elevated further titration of losartan to 50 mg or spironolactone can be undertaken.  She is scheduled undergo follow-up hematology laboratory tomorrow at [facility].  I have also recommended that at that laboratory visit she had a comprehensive metabolic panel, lipid panel and will also check LP(a).  She remains asymptomatic without recurrent chest pain or shortness of breath.  She denies exertional dyspnea.  She does have baseline wander on her ECG which may have contributed to the appearance of more T wave abnormality.  I am recommending continued aggressive lipid management target LDL less than 55.  He has continued to be on atorvastatin 40 mg and depending upon laboratory further titration or initiation of Zetia can be undertaken.  I will contact her regarding these results.  I will plan to see her in 5 months for follow-up evaluation or sooner as needed.    Lennette Bihari, MD, [facility]  12/20/2022 11:16 AM

## 2022-12-20 NOTE — Patient Instructions (Addendum)
Medication Instructions:  Start taking Losartan 25 MG by mouth daily Stop taking Spironolactone 50 MG daily Start taking Spironolactone to 25 MG by mouth daily   *If you need a refill on your cardiac medications before your next appointment, please call your pharmacy*  Lab Work: CMET, Lipid, Lpa  If you have labs (blood work) drawn today and your tests are completely normal, you will receive your results only by: MyChart Message (if you have MyChart) OR A paper copy in the mail If you have any lab test that is abnormal or we need to change your treatment, we will call you to review the results.  Testing/Procedures: None Ordered  Follow-Up: At [facility], you and your health needs are our priority.  As part of our continuing mission to provide you with exceptional heart care, we have created designated Provider Care Teams.  These Care Teams include your primary Cardiologist (physician) and Advanced Practice Providers (APPs -  Physician Assistants and Nurse Practitioners) who all work together to provide you with the care you need, when you need it.  We recommend signing up for the patient portal called "MyChart".  Sign up information is provided on this After Visit Summary.  MyChart is used to connect with patients for Virtual Visits (Telemedicine).  Patients are able to view lab/test results, encounter notes, upcoming appointments, etc.  Non-urgent messages can be sent to your provider as well.   To learn more about what you can do with MyChart, go to [email or web address].    Your next appointment:   4 month(s)  Provider:   Nicki Guadalajara, MD    Other Instructions Please take your blood pressure daily for 2 weeks and send in a MyChart message or bring into office. Please include heart rates.   HOW TO TAKE YOUR BLOOD PRESSURE: Rest 5 minutes before taking your blood pressure. Don't smoke or drink caffeinated beverages for at least 30 minutes before. Take your blood  pressure before (not after) you eat. Sit comfortably with your back supported and both feet on the floor (don't cross your legs). Elevate your arm to heart level on a table or a desk. Use the proper sized cuff. It should fit smoothly and snugly around your bare upper arm. There should be enough room to slip a fingertip under the cuff. The bottom edge of the cuff should be 1 inch above the crease of the elbow. Ideally, take 3 measurements at one sitting and record the average.

## 2022-12-21 ENCOUNTER — Ambulatory Visit (HOSPITAL_BASED_OUTPATIENT_CLINIC_OR_DEPARTMENT_OTHER): Admit: 2022-12-21 | Payer: Medicare Other | Admitting: Plastic Surgery

## 2022-12-21 ENCOUNTER — Inpatient Hospital Stay: Payer: Medicare Other

## 2022-12-21 ENCOUNTER — Encounter: Payer: Self-pay | Admitting: Cardiovascular Disease

## 2022-12-21 ENCOUNTER — Encounter (HOSPITAL_BASED_OUTPATIENT_CLINIC_OR_DEPARTMENT_OTHER): Payer: Self-pay

## 2022-12-21 ENCOUNTER — Inpatient Hospital Stay: Payer: Medicare Other | Attending: Hematology

## 2022-12-21 ENCOUNTER — Other Ambulatory Visit (HOSPITAL_COMMUNITY)
Admission: RE | Admit: 2022-12-21 | Discharge: 2022-12-21 | Disposition: A | Discharge status: Home / Self Care | Payer: Medicare Other | Source: Ambulatory Visit | Attending: Cardiovascular Disease | Admitting: Cardiovascular Disease

## 2022-12-21 DIAGNOSIS — Z87440 Personal history of urinary (tract) infections: Secondary | ICD-10-CM | POA: Insufficient documentation

## 2022-12-21 DIAGNOSIS — N3091 Cystitis, unspecified with hematuria: Secondary | ICD-10-CM | POA: Diagnosis not present

## 2022-12-21 DIAGNOSIS — I10 Essential (primary) hypertension: Secondary | ICD-10-CM | POA: Diagnosis not present

## 2022-12-21 DIAGNOSIS — D75839 Thrombocytosis, unspecified: Secondary | ICD-10-CM | POA: Diagnosis not present

## 2022-12-21 DIAGNOSIS — E611 Iron deficiency: Secondary | ICD-10-CM | POA: Diagnosis not present

## 2022-12-21 DIAGNOSIS — I2510 Atherosclerotic heart disease of native coronary artery without angina pectoris: Secondary | ICD-10-CM | POA: Diagnosis not present

## 2022-12-21 LAB — CBC WITH DIFFERENTIAL/PLATELET
Abs Immature Granulocytes: 0.03 10*3/uL (ref 0.00–0.07)
Basophils Absolute: 0.1 10*3/uL (ref 0.0–0.1)
Basophils Relative: 1 %
Eosinophils Absolute: 0.3 10*3/uL (ref 0.0–0.5)
Eosinophils Relative: 3 %
HCT: 45.4 % (ref 36.0–46.0)
Hemoglobin: 14.6 g/dL (ref 12.0–15.0)
Immature Granulocytes: 0 %
Lymphocytes Relative: 31 %
Lymphs Abs: 3.2 10*3/uL (ref 0.7–4.0)
MCH: 27.5 pg (ref 26.0–34.0)
MCHC: 32.2 g/dL (ref 30.0–36.0)
MCV: 85.5 fL (ref 80.0–100.0)
Monocytes Absolute: 0.7 10*3/uL (ref 0.1–1.0)
Monocytes Relative: 7 %
Neutro Abs: 5.9 10*3/uL (ref 1.7–7.7)
Neutrophils Relative %: 58 %
Platelets: 431 10*3/uL — ABNORMAL HIGH (ref 150–400)
RBC: 5.31 MIL/uL — ABNORMAL HIGH (ref 3.87–5.11)
RDW: 14 % (ref 11.5–15.5)
WBC: 10.1 10*3/uL (ref 4.0–10.5)
nRBC: 0 % (ref 0.0–0.2)

## 2022-12-21 LAB — IRON AND TIBC
Iron: 68 ug/dL (ref 28–170)
Saturation Ratios: 20 % (ref 10.4–31.8)
TIBC: 349 ug/dL (ref 250–450)
UIBC: 281 ug/dL

## 2022-12-21 LAB — COMPREHENSIVE METABOLIC PANEL
ALT: 16 U/L (ref 0–44)
AST: 14 U/L — ABNORMAL LOW (ref 15–41)
Albumin: 3.9 g/dL (ref 3.5–5.0)
Alkaline Phosphatase: 82 U/L (ref 38–126)
Anion gap: 9 (ref 5–15)
BUN: 18 mg/dL (ref 8–23)
CO2: 25 mmol/L (ref 22–32)
Calcium: 8.9 mg/dL (ref 8.9–10.3)
Chloride: 103 mmol/L (ref 98–111)
Creatinine, Ser: 0.73 mg/dL (ref 0.44–1.00)
GFR, Estimated: >60 mL/min (ref >60)
Glucose, Bld: 99 mg/dL (ref 70–99)
Potassium: 4.1 mmol/L (ref 3.5–5.1)
Sodium: 137 mmol/L (ref 135–145)
Total Bilirubin: 0.6 mg/dL (ref <1.2)
Total Protein: 6.6 g/dL (ref 6.5–8.1)

## 2022-12-21 LAB — LIPID PANEL
Cholesterol: 145 mg/dL (ref 0–200)
HDL: 59 mg/dL (ref >40)
LDL Cholesterol: 71 mg/dL (ref 0–99)
Total CHOL/HDL Ratio: 2.5 {ratio}
Triglycerides: 74 mg/dL (ref <150)
VLDL: 15 mg/dL (ref 0–40)

## 2022-12-21 LAB — FERRITIN: Ferritin: 53 ng/mL (ref 11–307)

## 2022-12-21 SURGERY — BREAST REDUCTION WITH LIPOSUCTION
Anesthesia: Choice | Laterality: Bilateral

## 2022-12-23 LAB — LIPOPROTEIN A (LPA): Lipoprotein (a): 23 nmol/L (ref <75.0)

## 2022-12-26 ENCOUNTER — Encounter: Payer: Self-pay | Admitting: Cardiovascular Disease

## 2022-12-26 MED ORDER — ATORVASTATIN CALCIUM 40 MG PO TABS: 40.0000 mg | ORAL_TABLET | Freq: Every day | ORAL | 3 refills | Status: DC

## 2022-12-28 ENCOUNTER — Encounter: Payer: Self-pay | Admitting: Oncology

## 2022-12-28 ENCOUNTER — Encounter: Payer: Self-pay | Admitting: Internal Medicine

## 2022-12-28 ENCOUNTER — Inpatient Hospital Stay (HOSPITAL_BASED_OUTPATIENT_CLINIC_OR_DEPARTMENT_OTHER): Payer: Medicare Other | Admitting: Oncology

## 2022-12-28 DIAGNOSIS — E611 Iron deficiency: Secondary | ICD-10-CM | POA: Insufficient documentation

## 2022-12-28 DIAGNOSIS — D75839 Thrombocytosis, unspecified: Secondary | ICD-10-CM | POA: Diagnosis not present

## 2022-12-28 NOTE — Progress Notes (Signed)
Virtual Visit via Telephone Note  I connected with [name] on 12/28/22 at  9:00 AM EST by telephone and verified that I am speaking with the correct person using two identifiers.  Location: Patient: Home Provider: Clinic   I discussed the limitations, risks, security and privacy concerns of performing an evaluation and management service by telephone and the availability of in person appointments. I also discussed with the patient that there may be a patient responsible charge related to this service. The patient expressed understanding and agreed to proceed.   History of Present Illness: [name] is a 72 year old female who is followed here at [facility] for thrombocytosis and iron deficiency.  She was last evaluated on 06/21/2022 by Rojelio Brenner, PA.  Formal hematology workup from 05/30/2021 was negative for JAK2 with reflex to CALR/MPL and BCR able FISH negative.  Negative RA, ANA.  Mildly elevated CRP.  Bone marrow biopsy from 12/05/2021 did not reveal any evidence of MPN.  Pathologist reviewed and thought to be reactive likely due to iron deficiency or other inflammatory process.  She continues oral iron (Geritol) with pretty good tolerance.  She presents today virtually to discuss recent lab results.  Overall, reports doing well although she has been getting recurrent UTIs over the last 6 months.  Reports she thinks she has one developing right now.  Has history of hematuria secondary to hemorrhagic cystitis (followed by urology)has Keflex on hand that she self starts when needed.  Reports if her iron levels continue to be low, she is interested in IV iron.  Her daughter receives frequent IV iron infusions but had a severe reaction to one of them she is unsure of which infusion caused the reaction but will get this information from her daughter.  She denies any bleeding.  Reports sensitivity to medications and is nervous to receive IV iron.  Has stable chronic back pain.   Appetite 50% energy level 70%.  Has chronic depression but is controlled with Lyrica.    Observations/Objective: Review of Systems  Constitutional:  Positive for malaise/fatigue.  Genitourinary:  Positive for frequency and urgency.   Physical Exam Neurological:     Mental Status: She is alert and oriented to person, place, and time.    Assessment and Plan: 1. Thrombocytosis -Thought to be secondary to iron deficiency or underlying inflammation. -Currently on Geritol which she takes daily.  Tolerates well. -Labs from 12/21/2022 show hemoglobin 14.6, iron saturation 20% and a ferritin of 53.  TIBC 349.  Platelet count 431 (461).  -Continue to monitor with labs every 6 months.  2. Iron deficiency -Likely secondary to malabsorption and occasional hemorrhoidal and history of hemorrhagic cystitis (April 2024) bleeding.  Previous colonoscopy/EGD did not reveal evidence of bleeding. -She denies active bleeding. -Currently on Geritol which she takes daily and tolerates well. -Labs from 12/21/2022 show hemoglobin of 14.6, iron saturation 20% and a ferritin of 53.  TIBC 349.  Platelet count 431. -Patient is interested in IV iron although reluctant secondary to sensitivities to most medications and daughter's reaction to IV iron in the past.  She will get back to me on specific IV iron daughter had to avoid that one specifically but is willing to receive IV iron.  Addendum: Patient's daughter had reaction to iron dextran.  We discussed trying IV Venofer which has lower reaction rates.  Orders placed and she will be scheduled in the next couple of days.  Education provided via MyChart for her to review.  Follow  Up Instructions: PLAN SUMMARY: >> Continue Geritol daily given good tolerance. >> Recommend IV iron but unsure of type and will wait for daughters recommendation.   >> Recommend lab only in 12 weeks and follow-up in clinic 6 months with labs 1 week before.     I discussed the assessment  and treatment plan with the patient. The patient was provided an opportunity to ask questions and all were answered. The patient agreed with the plan and demonstrated an understanding of the instructions.   The patient was advised to call back or seek an in-person evaluation if the symptoms worsen or if the condition fails to improve as anticipated.  I provided 20 minutes of non-face-to-face time during this encounter.   Mauro Kaufmann, NP

## 2022-12-28 NOTE — Patient Instructions (Signed)
Hey Amy, here are a few of the IV iron options we have here at the clinic.  Please discuss with your daughter which 1 she had a bad reaction to.  Durenda Hurt, NP 12/28/2022 9:41 AM

## 2022-12-29 ENCOUNTER — Encounter: Payer: Self-pay | Admitting: Hematology

## 2022-12-29 ENCOUNTER — Encounter: Payer: Medicare Other | Admitting: Plastic Surgery

## 2023-01-02 ENCOUNTER — Ambulatory Visit
Admission: EM | Admit: 2023-01-02 | Discharge: 2023-01-02 | Disposition: A | Discharge status: Home / Self Care | Payer: Medicare Other

## 2023-01-02 ENCOUNTER — Telehealth: Payer: Self-pay | Admitting: Physician Assistant

## 2023-01-02 ENCOUNTER — Telehealth: Payer: Self-pay | Admitting: *Deleted

## 2023-01-02 ENCOUNTER — Ambulatory Visit: Payer: Medicare Other

## 2023-01-02 ENCOUNTER — Ambulatory Visit: Payer: Medicare Other | Admitting: Gastroenterology

## 2023-01-02 DIAGNOSIS — R195 Other fecal abnormalities: Secondary | ICD-10-CM

## 2023-01-02 NOTE — Telephone Encounter (Signed)
PT wants to cancel appointment. She will be going to urgent care instead.

## 2023-01-02 NOTE — Telephone Encounter (Signed)
PT returned call. She did not go to the urgent care afraid they would not be able to test if she has an infection. She wants to be rescheduled and I gave her the next available. She will be calling back tomorrow to check cancellations.

## 2023-01-02 NOTE — Telephone Encounter (Signed)
Patient called and stated that she has been seen by the ED and they were able to test her and she does not have an infection. Also stated that she was good and it was nothing to worry about, and would like to disregard her previous telephone encounters.

## 2023-01-02 NOTE — Telephone Encounter (Signed)
Called the patient to ask if she is planning to still have the Colonoscopy on 01/15/23; patient states she will be here for the procedure. During the conversation, patient wanted to know if there were any appts available at this time? Informed the patient there is nothing available at present, but as suggested earlier this am, she could be seen at a urgent care. Patient states she cancelled the appt because she wanted to wait or give it another day before being seen. Suggested to the patient again, to go to the urgent care due to complaints of having" mucus like pus" coming from her rectum. Patient stated " I will"

## 2023-01-02 NOTE — Telephone Encounter (Signed)
Inbound call from patient stating she did not go to urgent care today. States she called her PCP and stated they are not equipped to look at symptoms. Patient is requesting a call to discuss further. States she would also like to be scheduled before 12/2 procedures for an office visit. Please advise, thank you.
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
## 2023-01-02 NOTE — Telephone Encounter (Signed)
Inbound call from patient, states she has been passing "mucus like pus" through her rectum. She states she feels as if she has an infection. She would like a medication called in to clear it prior to her endo colon.

## 2023-01-02 NOTE — ED Triage Notes (Addendum)
Pt reports needing  a stool sample to check for an infection prior to receiving a colonoscopy. Pt states yesterday she went shopping while shopping she had water like diarrhea, and had mucus in her stool. States the mucus comes out in clumps.

## 2023-01-02 NOTE — Discharge Instructions (Signed)
Continue to hydrate and eat plenty of fiber.  If the mucus returns, recommend follow up with your Gastroenterologist.

## 2023-01-02 NOTE — Telephone Encounter (Signed)
-----   Message from [facility] sent at 01/02/2023  9:22 AM EST ----- Regarding: RE: mucus like pus from rectum I see she was on the schedule and is now cancelled. So is she planning to come in for appt or wait until 12/02 with Dr. Marina Goodell? ----- Message ----- From: Avanell Shackleton, RN Sent: 01/02/2023   9:01 AM EST To: Legrand Como, PA-C; Jeanine Luz, CMA Subject: mucus like pus from rectum                     Patient called with complaints of having mucus like pus coming from her rectum. Although patient states she did not want to come in today, suggested that she be seen if even by urgent care. Was informed there was an appt available with the APP Boone Master, PA. Patient has been scheduled to be seen today with [facility]. Patient agreed.

## 2023-01-02 NOTE — Telephone Encounter (Signed)
Patient called with complaints of having mucus like pus coming from her rectum. Although patient states she did not want to come in today, suggested that she be seen if even by urgent care. Was informed there was an appt available with the APP Boone Master, PA. Patient has been scheduled to be seen today with [facility]. Patient agreed.

## 2023-01-03 NOTE — Telephone Encounter (Signed)
Noted  

## 2023-01-04 NOTE — Telephone Encounter (Signed)
Inbound call from patient, states she would like to know if Dr. Marina Goodell advises her to continue the colonoscopy. She states she has an impact with stool and mucus. Please advise.

## 2023-01-05 ENCOUNTER — Encounter (HOSPITAL_COMMUNITY): Payer: Self-pay

## 2023-01-05 ENCOUNTER — Emergency Department (HOSPITAL_COMMUNITY)
Admission: EM | Admit: 2023-01-05 | Discharge: 2023-01-05 | Disposition: A | Discharge status: Home / Self Care | Payer: Medicare Other | Attending: Emergency Medicine | Admitting: Emergency Medicine

## 2023-01-05 ENCOUNTER — Other Ambulatory Visit: Payer: Self-pay

## 2023-01-05 DIAGNOSIS — Z7982 Long term (current) use of aspirin: Secondary | ICD-10-CM | POA: Insufficient documentation

## 2023-01-05 DIAGNOSIS — Z91040 Latex allergy status: Secondary | ICD-10-CM | POA: Diagnosis not present

## 2023-01-05 DIAGNOSIS — Z79899 Other long term (current) drug therapy: Secondary | ICD-10-CM | POA: Diagnosis not present

## 2023-01-05 DIAGNOSIS — R197 Diarrhea, unspecified: Secondary | ICD-10-CM | POA: Insufficient documentation

## 2023-01-05 DIAGNOSIS — K625 Hemorrhage of anus and rectum: Secondary | ICD-10-CM | POA: Diagnosis not present

## 2023-01-05 DIAGNOSIS — R109 Unspecified abdominal pain: Secondary | ICD-10-CM | POA: Diagnosis not present

## 2023-01-05 DIAGNOSIS — I10 Essential (primary) hypertension: Secondary | ICD-10-CM | POA: Diagnosis not present

## 2023-01-05 DIAGNOSIS — I2510 Atherosclerotic heart disease of native coronary artery without angina pectoris: Secondary | ICD-10-CM | POA: Diagnosis not present

## 2023-01-05 LAB — CBC WITH DIFFERENTIAL/PLATELET
Abs Immature Granulocytes: 0.04 10*3/uL (ref 0.00–0.07)
Basophils Absolute: 0.1 10*3/uL (ref 0.0–0.1)
Basophils Relative: 1 %
Eosinophils Absolute: 0.2 10*3/uL (ref 0.0–0.5)
Eosinophils Relative: 2 %
HCT: 45.1 % (ref 36.0–46.0)
Hemoglobin: 14.6 g/dL (ref 12.0–15.0)
Immature Granulocytes: 0 %
Lymphocytes Relative: 30 %
Lymphs Abs: 3 10*3/uL (ref 0.7–4.0)
MCH: 28 pg (ref 26.0–34.0)
MCHC: 32.4 g/dL (ref 30.0–36.0)
MCV: 86.4 fL (ref 80.0–100.0)
Monocytes Absolute: 0.5 10*3/uL (ref 0.1–1.0)
Monocytes Relative: 5 %
Neutro Abs: 6.2 10*3/uL (ref 1.7–7.7)
Neutrophils Relative %: 62 %
Platelets: 444 10*3/uL — ABNORMAL HIGH (ref 150–400)
RBC: 5.22 MIL/uL — ABNORMAL HIGH (ref 3.87–5.11)
RDW: 14 % (ref 11.5–15.5)
WBC: 10.1 10*3/uL (ref 4.0–10.5)
nRBC: 0 % (ref 0.0–0.2)

## 2023-01-05 LAB — URINALYSIS, ROUTINE W REFLEX MICROSCOPIC
Bilirubin Urine: NEGATIVE
Glucose, UA: NEGATIVE mg/dL
Hgb urine dipstick: NEGATIVE
Ketones, ur: NEGATIVE mg/dL
Leukocytes,Ua: NEGATIVE
Nitrite: NEGATIVE
Protein, ur: NEGATIVE mg/dL
Specific Gravity, Urine: 1.004 — ABNORMAL LOW (ref 1.005–1.030)
pH: 6 (ref 5.0–8.0)

## 2023-01-05 LAB — C DIFFICILE QUICK SCREEN W PCR REFLEX
C Diff antigen: POSITIVE — AB
C Diff toxin: NEGATIVE

## 2023-01-05 LAB — COMPREHENSIVE METABOLIC PANEL
ALT: 17 U/L (ref 0–44)
AST: 15 U/L (ref 15–41)
Albumin: 3.9 g/dL (ref 3.5–5.0)
Alkaline Phosphatase: 82 U/L (ref 38–126)
Anion gap: 8 (ref 5–15)
BUN: 21 mg/dL (ref 8–23)
CO2: 24 mmol/L (ref 22–32)
Calcium: 8.9 mg/dL (ref 8.9–10.3)
Chloride: 103 mmol/L (ref 98–111)
Creatinine, Ser: 0.76 mg/dL (ref 0.44–1.00)
GFR, Estimated: >60 mL/min (ref >60)
Glucose, Bld: 139 mg/dL — ABNORMAL HIGH (ref 70–99)
Potassium: 3.3 mmol/L — ABNORMAL LOW (ref 3.5–5.1)
Sodium: 135 mmol/L (ref 135–145)
Total Bilirubin: 0.6 mg/dL (ref <1.2)
Total Protein: 6.8 g/dL (ref 6.5–8.1)

## 2023-01-05 LAB — CLOSTRIDIUM DIFFICILE BY PCR, REFLEXED: Toxigenic C. Difficile by PCR: POSITIVE — AB

## 2023-01-05 LAB — LIPASE, BLOOD: Lipase: 35 U/L (ref 11–51)

## 2023-01-05 MED ORDER — POTASSIUM CHLORIDE CRYS ER 20 MEQ PO TBCR
20.0000 meq | EXTENDED_RELEASE_TABLET | Freq: Once | ORAL | Status: AC
  Administered 2023-01-05: 20 meq via ORAL
  Filled 2023-01-05: qty 1

## 2023-01-05 NOTE — Telephone Encounter (Signed)
Patient called to see if possible to be seen via the NP, or PA. Informed the patient there is no available schedules today. Patient states she feels badly and needs to be seen. Patient was offered an appt on 01/03/23, she accepted the appt then called back and cancelled. Patient states she wanted to wait and see if it would be ok. Patient was encouraged to go to the urgent care or ED and continued to do the same today. Patient also wanted to talk to a NP or PA. Informed the patient that all of the providers were busy seeing other scheduled patients or were making rounds at the hospital. Patient was again instructed to go to the urgent care or the ED. Patient became angry and hung up.

## 2023-01-05 NOTE — ED Provider Notes (Signed)
[place] EMERGENCY DEPARTMENT AT [facility] Provider Note   CSN: [id number] Arrival date & time: 01/05/23  1621     History  Chief Complaint  Patient presents with   Rectal Discharge    [name] [name] is a 72 y.o. female.  He has PMH of anxiety, hypertension, hyperlipidemia, prediabetes, CAD, hearing loss.  She presents to the ER for 4 days of mucus in her stool and diarrhea.  She initially had some mild abdominal pain which is now resolved, no fever or chills, no blood in the stool, no nausea or vomiting.  She is never had in the past.  Denies sick contacts.  Denies recent antibiotics.  HPI     Home Medications Prior to Admission medications   Medication Sig Start Date End Date Taking? Authorizing Provider  aspirin (ASPIRIN LOW DOSE) 81 MG chewable tablet  05/26/11   [provider]  atorvastatin (LIPITOR) 40 MG tablet Take 1 tablet (40 mg total) by mouth daily. 12/26/22   Lennette Bihari, MD  diclofenac Sodium (VOLTAREN) 1 % GEL Apply 2 g topically as needed. 07/30/18   [provider]  famotidine (PEPCID) 20 MG tablet Take 20-40 mg by mouth as needed for heartburn or indigestion.    [provider]  losartan (COZAAR) 25 MG tablet Take 1 tablet (25 mg total) by mouth daily. 12/20/22 03/20/23  Lennette Bihari, MD  pregabalin (LYRICA) 25 MG capsule Take 25-75 mg by mouth at bedtime. 11/30/22   [provider]  spironolactone (ALDACTONE) 25 MG tablet Take 1 tablet (25 mg total) by mouth daily. 12/20/22 03/20/23  Lennette Bihari, MD  traMADol (ULTRAM) 50 MG tablet Take 25 mg by mouth as needed. 09/18/18   [provider]  Vitamin D-Vitamin K (VITAMIN K2-VITAMIN D3 PO) Take by mouth.    [provider]      Allergies    Trimethoprim, Crestor [rosuvastatin], Eplerenone, Hydrochlorothiazide, Levaquin [levofloxacin], Amlodipine, Cetirizine & related, Latex, Levofloxacin, Lisinopril, Prednisone, and Tape    Review of Systems    Review of Systems  Physical Exam Updated Vital Signs BP (!) 170/77   Pulse 72   Temp 98.7 F (37.1 C) (Oral)   Resp 16   Ht 5\' 4"  (1.626 m)   Wt 75.8 kg   SpO2 99%   BMI 28.67 kg/m  Physical Exam Vitals and nursing note reviewed.  Constitutional:      General: She is not in acute distress.    Appearance: She is well-developed.  HENT:     Head: Normocephalic and atraumatic.     Mouth/Throat:     Mouth: Mucous membranes are moist.  Eyes:     Conjunctiva/sclera: Conjunctivae normal.  Cardiovascular:     Rate and Rhythm: Normal rate and regular rhythm.     Heart sounds: No murmur heard. Pulmonary:     Effort: Pulmonary effort is normal. No respiratory distress.     Breath sounds: Normal breath sounds.  Abdominal:     Palpations: Abdomen is soft.     Tenderness: There is no abdominal tenderness.  Musculoskeletal:        General: No swelling.     Cervical back: Neck supple.  Skin:    General: Skin is warm and dry.     Capillary Refill: Capillary refill takes less than 2 seconds.  Neurological:     General: No focal deficit present.     Mental Status: She is alert and oriented to person, place, and  time.  Psychiatric:        Mood and Affect: Mood normal.     ED Results / Procedures / Treatments   Labs (all labs ordered are listed, but only abnormal results are displayed) Labs Reviewed  C DIFFICILE QUICK SCREEN W PCR REFLEX   - Abnormal; Notable for the following components:      Result Value   C Diff antigen POSITIVE (*)    All other components within normal limits  URINALYSIS, ROUTINE W REFLEX MICROSCOPIC - Abnormal; Notable for the following components:   Color, Urine COLORLESS (*)    Specific Gravity, Urine 1.004 (*)    All other components within normal limits  CBC WITH DIFFERENTIAL/PLATELET - Abnormal; Notable for the following components:   RBC 5.22 (*)    Platelets 444 (*)    All other components within normal limits  COMPREHENSIVE METABOLIC PANEL -  Abnormal; Notable for the following components:   Potassium 3.3 (*)    Glucose, Bld 139 (*)    All other components within normal limits  GASTROINTESTINAL PANEL BY PCR, STOOL (REPLACES STOOL CULTURE)  CLOSTRIDIUM DIFFICILE BY PCR, REFLEXED  LIPASE, BLOOD    EKG None  Radiology No results found.  Procedures Procedures    Medications Ordered in ED Medications - No data to display  ED Course/ Medical Decision Making/ A&P                                 Medical Decision Making DDx: Infectious diarrhea, functional diarrhea, constipation, colitis, gastroenteritis, other  ED course: Patient presents for 4 days of diarrhea and mucus in the stool.  No abdominal pain or fevers, no leukocytosis or metabolic abnormalities outside of mild hyponatremia which is repleted.  Stool studies ordered, the C. difficile quick screen was positive for C. difficile antigen but negative for toxin so PCR has reflexed.  GI panel is also pending.  I discussed with patient her indeterminate C. difficile result.  Discussed good handwashing and contact precautions until she gets that result at which time she will be called with positive and treated.  I considered CT but patient has no abdominal pain or tenderness, no fever, no leukocytosis.  No nausea or vomiting.    Patient is agreeable with plan of care and discharge.  Advised on brat diet  Amount and/or Complexity of Data Reviewed Labs: ordered.           Final Clinical Impression(s) / ED Diagnoses Final diagnoses:  None    Rx / DC Orders ED Discharge Orders     None         Josem Kaufmann 01/05/23 2055    Vanetta Mulders, MD 01/09/23 [phone]

## 2023-01-05 NOTE — ED Triage Notes (Signed)
Pt stated that she passing mucus from rectum x4 days   Green mucus   UA cup given  Cup given for stool   Denies pain at this time

## 2023-01-05 NOTE — Discharge Instructions (Signed)
You were seen in the ER today for diarrhea.  Overall your blood work was reassuring, your potassium was slightly low and we gave you potassium supplement.  Your C. difficile test was indeterminate with a positive antigen and a negative toxin so an additional test called a PCR is in process which will give a definitive result.  If this is positive you will be called and antibiotics will be started.  Please come back if you have fever, severe pain, vomiting or any other new or worsening symptoms.  I recommend BRAT diet, stands for bananas rice applesauce and toast which can be helped to lessen symptoms of diarrhea.  Overall avoiding spicy or greasy foods can be helpful.  Make sure you wash your hands thoroughly and clean all surfaces thoroughly in case of any infection to prevent spread.

## 2023-01-06 ENCOUNTER — Telehealth (HOSPITAL_COMMUNITY): Payer: Self-pay | Admitting: Physician Assistant

## 2023-01-06 LAB — GASTROINTESTINAL PANEL BY PCR, STOOL (REPLACES STOOL CULTURE)

## 2023-01-06 MED ORDER — VANCOMYCIN HCL 125 MG PO CAPS: 125.0000 mg | ORAL_CAPSULE | Freq: Four times a day (QID) | ORAL | 0 refills | Status: AC

## 2023-01-06 NOTE — Telephone Encounter (Cosign Needed)
Pt seen by me last night for diarrhea, PCR for C. Diff pending.  Follow-up today reveals patient had positive C. difficile PCR.  Patient states symptoms do seem slightly better today.  Again discussed contact precautions, oral vancomycin called in.  Advised on follow-up and return precautions.

## 2023-01-08 ENCOUNTER — Inpatient Hospital Stay: Payer: Medicare Other

## 2023-01-08 ENCOUNTER — Ambulatory Visit: Payer: Medicare Other

## 2023-01-09 ENCOUNTER — Encounter: Payer: Medicare Other | Admitting: Surgical

## 2023-01-15 ENCOUNTER — Encounter: Payer: Medicare Other | Admitting: Internal Medicine

## 2023-01-16 ENCOUNTER — Telehealth (INDEPENDENT_AMBULATORY_CARE_PROVIDER_SITE_OTHER): Payer: Self-pay | Admitting: *Deleted

## 2023-01-16 ENCOUNTER — Telehealth: Payer: Self-pay | Admitting: Internal Medicine

## 2023-01-16 NOTE — Telephone Encounter (Signed)
Patient called and wanted to make an appointment.  She said her PC was supposed to have sent a referral.  I explained to her that she was a [facility] patient and she said she didn't want to go back to them.  She had a terrible time with one of the nurses in that office when she called for advice about a problem she was having and the nurse wouldn't let her speak to a PA and told her she would need to go the ED or urgent care.  She wants to be seen in our office so as to not return to [facility].

## 2023-01-16 NOTE — Telephone Encounter (Signed)
Error

## 2023-01-17 ENCOUNTER — Ambulatory Visit
Admission: RE | Admit: 2023-01-17 | Discharge: 2023-01-17 | Disposition: A | Discharge status: Home / Self Care | Payer: Medicare Other | Source: Ambulatory Visit | Attending: Obstetrics and Gynecology | Admitting: Obstetrics and Gynecology

## 2023-01-17 ENCOUNTER — Ambulatory Visit: Payer: Medicare Other | Admitting: Internal Medicine

## 2023-01-17 DIAGNOSIS — Z1231 Encounter for screening mammogram for malignant neoplasm of breast: Secondary | ICD-10-CM | POA: Diagnosis not present

## 2023-01-18 ENCOUNTER — Encounter (INDEPENDENT_AMBULATORY_CARE_PROVIDER_SITE_OTHER): Payer: Self-pay | Admitting: *Deleted

## 2023-01-19 ENCOUNTER — Inpatient Hospital Stay: Payer: Medicare Other

## 2023-01-22 ENCOUNTER — Encounter (INDEPENDENT_AMBULATORY_CARE_PROVIDER_SITE_OTHER): Payer: Self-pay | Admitting: Gastroenterology

## 2023-01-22 ENCOUNTER — Ambulatory Visit (INDEPENDENT_AMBULATORY_CARE_PROVIDER_SITE_OTHER): Payer: Medicare Other | Admitting: Gastroenterology

## 2023-01-22 ENCOUNTER — Telehealth (INDEPENDENT_AMBULATORY_CARE_PROVIDER_SITE_OTHER): Payer: Self-pay | Admitting: Gastroenterology

## 2023-01-22 VITALS — BP 136/79 | HR 68 | Temp 98.9°F | Ht 64.0 in | Wt 168.1 lb

## 2023-01-22 DIAGNOSIS — R140 Abdominal distension (gaseous): Secondary | ICD-10-CM | POA: Diagnosis not present

## 2023-01-22 DIAGNOSIS — A0472 Enterocolitis due to Clostridium difficile, not specified as recurrent: Secondary | ICD-10-CM | POA: Insufficient documentation

## 2023-01-22 DIAGNOSIS — K219 Gastro-esophageal reflux disease without esophagitis: Secondary | ICD-10-CM | POA: Diagnosis not present

## 2023-01-22 DIAGNOSIS — Z8619 Personal history of other infectious and parasitic diseases: Secondary | ICD-10-CM

## 2023-01-22 DIAGNOSIS — K5904 Chronic idiopathic constipation: Secondary | ICD-10-CM | POA: Insufficient documentation

## 2023-01-22 DIAGNOSIS — K5909 Other constipation: Secondary | ICD-10-CM | POA: Diagnosis not present

## 2023-01-22 DIAGNOSIS — N390 Urinary tract infection, site not specified: Secondary | ICD-10-CM

## 2023-01-22 DIAGNOSIS — Z1211 Encounter for screening for malignant neoplasm of colon: Secondary | ICD-10-CM | POA: Insufficient documentation

## 2023-01-22 MED ORDER — POLYETHYLENE GLYCOL 3350 17 G PO PACK: 17.0000 g | PACK | Freq: Two times a day (BID) | ORAL | 0 refills | Status: DC

## 2023-01-22 MED ORDER — PSYLLIUM 58.6 % PO PACK: 1.0000 | PACK | Freq: Two times a day (BID) | ORAL | 2 refills | Status: DC

## 2023-01-22 MED ORDER — PANTOPRAZOLE SODIUM 40 MG PO TBEC: 40.0000 mg | DELAYED_RELEASE_TABLET | Freq: Every day | ORAL | 3 refills | Status: DC
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
## 2023-01-22 NOTE — Telephone Encounter (Signed)
Left message for pt to return call to schedule TCS/EGD with Dr.Ahmed.

## 2023-01-22 NOTE — Patient Instructions (Signed)
It was very nice to meet you today, as dicussed with will plan for the following :  1) Ensure adequate fluid intake: Aim for 8 glasses of water daily. Follow a high fiber diet: Include foods such as dates, prunes, pears, and kiwi. Take Miralax twice a day for the first week, then reduce to once daily thereafter. Use Metamucil twice a day.   2) Protonix 40mg  , 30 min before breakfast  3) upper endoscopy and colonoscopy

## 2023-01-22 NOTE — Telephone Encounter (Signed)
Pt returned call. Pt scheduled for 02/19/23 at 2:00pm. Pt has Sup[employer] from [employer]. Instructions mailed to pt. Will need to complete PA closer to procedure date.

## 2023-01-22 NOTE — Addendum Note (Signed)
Addended by: Marlowe Shores on: 01/22/2023 12:30 PM   Modules accepted: Orders

## 2023-01-23 ENCOUNTER — Encounter: Payer: Medicare Other | Admitting: Surgical

## 2023-01-29 ENCOUNTER — Telehealth (INDEPENDENT_AMBULATORY_CARE_PROVIDER_SITE_OTHER): Payer: Self-pay | Admitting: *Deleted

## 2023-01-29 NOTE — Telephone Encounter (Signed)
Patient called and states she has TCS and EGD scheduled 02-19-23. She wants to keep TCS but cancel EGD. She states she does not feel like she needs EGD and wants to cancel that part but proceed with TCS.   [phone]

## 2023-01-29 NOTE — Telephone Encounter (Signed)
Ok , we can cancel EGD and keep TCS as per patient wishes and revisit this in future clinic visit

## 2023-01-30 ENCOUNTER — Encounter (INDEPENDENT_AMBULATORY_CARE_PROVIDER_SITE_OTHER): Payer: Self-pay | Admitting: Gastroenterology

## 2023-01-30 NOTE — Telephone Encounter (Signed)
EGD cancelled. Message sent to endo

## 2023-02-12 ENCOUNTER — Other Ambulatory Visit (HOSPITAL_COMMUNITY)
Admission: RE | Admit: 2023-02-12 | Discharge: 2023-02-12 | Disposition: A | Discharge status: Home / Self Care | Payer: Medicare Other | Source: Ambulatory Visit | Attending: Gastroenterology | Admitting: Gastroenterology

## 2023-02-12 DIAGNOSIS — K219 Gastro-esophageal reflux disease without esophagitis: Secondary | ICD-10-CM | POA: Insufficient documentation

## 2023-02-12 DIAGNOSIS — Z1211 Encounter for screening for malignant neoplasm of colon: Secondary | ICD-10-CM | POA: Insufficient documentation

## 2023-02-12 LAB — BASIC METABOLIC PANEL
Anion gap: 9 (ref 5–15)
BUN: 15 mg/dL (ref 8–23)
CO2: 23 mmol/L (ref 22–32)
Calcium: 9.1 mg/dL (ref 8.9–10.3)
Chloride: 104 mmol/L (ref 98–111)
Creatinine, Ser: 0.75 mg/dL (ref 0.44–1.00)
GFR, Estimated: >60 mL/min (ref >60)
Glucose, Bld: 99 mg/dL (ref 70–99)
Potassium: 3.6 mmol/L (ref 3.5–5.1)
Sodium: 136 mmol/L (ref 135–145)

## 2023-02-15 ENCOUNTER — Encounter (HOSPITAL_COMMUNITY): Payer: Self-pay | Admitting: Certified Registered Nurse Anesthetist

## 2023-02-15 NOTE — Telephone Encounter (Signed)
 No pa needed per [facility] for EGD/TCS

## 2023-02-19 ENCOUNTER — Encounter (HOSPITAL_COMMUNITY): Admission: RE | Payer: Self-pay | Source: Home / Self Care

## 2023-02-19 ENCOUNTER — Telehealth (INDEPENDENT_AMBULATORY_CARE_PROVIDER_SITE_OTHER): Payer: Self-pay | Admitting: Gastroenterology

## 2023-02-19 ENCOUNTER — Ambulatory Visit (HOSPITAL_COMMUNITY): Admission: RE | Admit: 2023-02-19 | Payer: Medicare Other | Source: Home / Self Care | Admitting: Gastroenterology

## 2023-02-19 SURGERY — COLONOSCOPY WITH PROPOFOL
Anesthesia: Monitor Anesthesia Care

## 2023-02-19 NOTE — Telephone Encounter (Signed)
 Page, Camelia SAUNDERS, RN  Arthurine Merle, LPN [place]. She would like to reschedule her procedure for after January 21st. Preferably the first of February if she could. Thank you!  Pt left voicemail's and sent my chart message also. First or second week of Feb-pt prefers afternoon appointment. Pt has been cancelled and I will call once I have Feb schedule available.

## 2023-02-21 NOTE — Telephone Encounter (Signed)
 Left message to return call

## 2023-02-21 NOTE — Telephone Encounter (Signed)
 Pt left voicemail confirming 03/19/23. Returned call to patient to acknowledge that I received her voicemail. Pt has prep. New instructions sent via mail.

## 2023-02-21 NOTE — Telephone Encounter (Signed)
 Pt left message returning call.  Returned call to patient. Pt tentatively scheduled for 03/19/23 at 1:30pm. Pt will call back to confirm once she has spoke with her daughter.

## 2023-02-23 ENCOUNTER — Encounter: Payer: Medicare Other | Admitting: Plastic Surgery

## 2023-03-02 ENCOUNTER — Encounter: Payer: Self-pay | Admitting: Urology

## 2023-03-02 ENCOUNTER — Ambulatory Visit: Payer: Medicare Other | Admitting: Urology

## 2023-03-02 ENCOUNTER — Encounter: Payer: Self-pay | Admitting: Cardiovascular Disease

## 2023-03-02 VITALS — BP 161/78 | HR 64

## 2023-03-02 DIAGNOSIS — N281 Cyst of kidney, acquired: Secondary | ICD-10-CM

## 2023-03-02 LAB — URINALYSIS, ROUTINE W REFLEX MICROSCOPIC
Bilirubin, UA: NEGATIVE
Glucose, UA: NEGATIVE
Ketones, UA: NEGATIVE
Nitrite, UA: NEGATIVE
Protein,UA: NEGATIVE
RBC, UA: NEGATIVE
Specific Gravity, UA: 1.015 (ref 1.005–1.030)
Urobilinogen, Ur: 0.2 mg/dL (ref 0.2–1.0)
pH, UA: 6 (ref 5.0–7.5)

## 2023-03-02 LAB — MICROSCOPIC EXAMINATION

## 2023-03-02 MED ORDER — LOSARTAN POTASSIUM 50 MG PO TABS: 50.0000 mg | ORAL_TABLET | Freq: Every day | ORAL | 3 refills | Status: DC
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
## 2023-03-02 NOTE — Telephone Encounter (Signed)
Patient identification verified by 2 forms. Marilynn Rail, RN    Called and spoke to patient  Patient states:   -has not been checking home BP since christmas   -12/18: 136/76 after medication   -at 5am BP: 157/83, heart rate 71   -she woke up this morning and had a nose bleed   -Took losartan 25mg  and spironolactone 25mg  at 5am   -1/17: 149/75 after medication at 7am   -at 10:30am during urology visit BP 167/78  -has cyst on right kidney, plan is to drain cyst, unsure if this well affect BP   -has been having a bit of headache, dull headache  Patient denies:   -chest pressure/chest pain   -SOB/difficulty breathing  Advised patient to keep BP log and send via mycart next week  Reviewed ED warning signs/precautions  Patient verbalized understanding, no questions at this time

## 2023-03-02 NOTE — Telephone Encounter (Signed)
Spoke to DOD Dr. Flora Lipps   -Increase losartan to 50mg  daily   -follow up with APP 6weeks  __________________________________ Patient identification verified by 2 forms. Marilynn Rail, RN    Called and spoke to patient  Relayed provider recommendations  Patient declined 6 week follow up with APP  Patient plans to follow up with DR. Kelly and send BP long in 1-2weeks  Reviewed ED warning signs/precautions  Patient verbalized understanding, no questions at this time

## 2023-03-04 ENCOUNTER — Encounter: Payer: Self-pay | Admitting: Cardiovascular Disease

## 2023-03-05 ENCOUNTER — Telehealth: Payer: Self-pay

## 2023-03-05 ENCOUNTER — Telehealth: Payer: Self-pay | Admitting: Urology

## 2023-03-05 NOTE — Telephone Encounter (Signed)
Patient states Saturday she started to have chills and a low body temp. Patient states her symptoms are a little better today. She states that she has no burning, odor, or discomfort while voiding. Patient states if her symptoms get worse she will call the office back.

## 2023-03-05 NOTE — Telephone Encounter (Signed)
States she got results from her urine and she has UTI. Please advise

## 2023-03-05 NOTE — Telephone Encounter (Signed)
Pt called "having body chills and low grade fever came in Friday and mychart states she has bacteria in her urine." We let her know that its not completely abnormal to have bacteria  in her urine and typically if the MD suspected UTI he would've called her something in, but we will let him know and he'll advise [place] if neccessary

## 2023-03-06 ENCOUNTER — Telehealth: Payer: Self-pay

## 2023-03-06 NOTE — Telephone Encounter (Signed)
Called Pt to see if she wanted to come in for an appointment Pt said that she feels okay now and its too cold to come out, but if she gains any symptoms again she will call to make an appointment

## 2023-03-06 NOTE — Progress Notes (Signed)
Sterling Big, MD  Claudean Kinds Approved for US guided aspiration of left renal cyst.  Discuss with pt considering ethanol ablation if cyst recurs.  HKM       Previous Messages    ----- Message ----- From: Claudean Kinds Sent: 03/02/2023  10:48 AM EST To: Claudean Kinds; Ir Procedure Requests Subject: CT GUIDED PERITONEAL/RETROPERITONEAL FLUID D*  Procedure : CT GUIDED PERITONEAL/RETROPERITONEAL FLUID DRAIN BY PERC CATH  Reason: left renal cyst Dx: Complex renal cyst [N28.1 (ICD-10-CM)]    History : CT renal stone study , MRI abdomen w/wo  Provider : Malen Gauze, MD  Provider contact : [phone]

## 2023-03-06 NOTE — Telephone Encounter (Signed)
Patient states she is scheduled for her US biopsy at 0600.  Patient states she cannot make that early appt and imaging services do not think they can schedule later in the day.  I offered her centralized scheduling number and informed her to call back to see it it could be scheduled on another day with a later time.  I requested her call back if she decided to cancel the procedure so we could notify MD.  Patient voiced understanding.

## 2023-03-08 ENCOUNTER — Telehealth: Payer: Self-pay | Admitting: Cardiovascular Disease

## 2023-03-08 NOTE — Telephone Encounter (Signed)
Pt c/o medication issue:  1. Name of Medication: losartan (COZAAR) 50 MG tablet   2. How are you currently taking this medication (dosage and times per day)? As directed  3. Are you having a reaction (difficulty breathing--STAT)? Flu like symptoms  4. What is your medication issue? Doesn't feel medication is working and it is causing her symptoms of flu like symptoms BP Today 132/66 63 162/84 61

## 2023-03-08 NOTE — Telephone Encounter (Signed)
Patient identification verified by 2 forms. Marilynn Rail, RN    Called and spoke to patient  Patient states:   -has concerns about BP   -does not feel like losartan is working   -takes losartan 50mg  daily   -also has sinus congestion, believes related to losartan   -felt lightheaded earlier, prompted her to check her BP at 3:45   -1/23 9:00am 132/66 HR 63 (1.5 hours after medication)   -1/23 3:45pm 164/84 HR 61  -1/22 9:30am 135/77 HR 54 (1 hour after medication)   -1/21 9:00am 135/69 HR 54 (1 hour after medication)  Patient denies:   -headache   -chest pressure   -chest pain   -SOB/difficulty breathing  RN advised patient might be best to make further adjustment in OV  Patient declined OV, states she has a lot of appointments/procedures coming up and is concerned about cost for copay at upcoming appointment  Informed patient message sent  Reviewed ED warning signs/precautions  Patient verbalized understanding, no questions at this time

## 2023-03-09 NOTE — Telephone Encounter (Signed)
Patient identification verified by 2 forms. [name] Rail, RN    Called and spoke to patient  Relayed provider message below  Patient states:   -does currently have back issue and a kidney cyst   -unsure if other medical issues are affecting BP Advised patient to continue regimen and monitor BP/symptoms  Patient verbalized understanding, no questions at this time

## 2023-03-09 NOTE — Telephone Encounter (Signed)
Runell Gess, MD  You2 minutes ago (9:29 AM)    Blood pressure readings actually look pretty good.

## 2023-03-14 ENCOUNTER — Telehealth: Payer: Self-pay | Admitting: Cardiovascular Disease

## 2023-03-14 NOTE — Telephone Encounter (Signed)
Patient identification verified by 2 forms. Marilynn Rail, RN    Called and spoke to patient  Patient states:   -blood pressure sometimes spikes   -would like to have appointment next week to discuss BP concerns  Patient scheduled for OV 2/5 at 10:55am with PA Lalla Brothers  Advised patient to continue keep BP log  Patient agrees, no further questions at this time

## 2023-03-14 NOTE — Telephone Encounter (Signed)
Pt c/o BP issue: STAT if pt c/o blurred vision, one-sided weakness or slurred speech  1. What are your last 5 BP readings? Mon-128/68 hr61, tues-139/70hr63, 142/75 hr 64, this morning 154/82 hr 59, 152/75 hr 62  2. Are you having any other symptoms (ex. Dizziness, headache, blurred vision, passed out)? no  3. What is your BP issue? Wants to know if needs to be seen due to bp issues

## 2023-03-16 ENCOUNTER — Other Ambulatory Visit (HOSPITAL_COMMUNITY)
Admission: RE | Admit: 2023-03-16 | Discharge: 2023-03-16 | Disposition: A | Discharge status: Home / Self Care | Payer: Medicare Other | Source: Ambulatory Visit | Attending: Gastroenterology | Admitting: Gastroenterology

## 2023-03-16 DIAGNOSIS — Z79899 Other long term (current) drug therapy: Secondary | ICD-10-CM | POA: Diagnosis not present

## 2023-03-16 DIAGNOSIS — I10 Essential (primary) hypertension: Secondary | ICD-10-CM | POA: Diagnosis not present

## 2023-03-16 DIAGNOSIS — K449 Diaphragmatic hernia without obstruction or gangrene: Secondary | ICD-10-CM | POA: Diagnosis not present

## 2023-03-16 DIAGNOSIS — K581 Irritable bowel syndrome with constipation: Secondary | ICD-10-CM | POA: Diagnosis not present

## 2023-03-16 DIAGNOSIS — K5730 Diverticulosis of large intestine without perforation or abscess without bleeding: Secondary | ICD-10-CM | POA: Diagnosis not present

## 2023-03-16 DIAGNOSIS — I2510 Atherosclerotic heart disease of native coronary artery without angina pectoris: Secondary | ICD-10-CM | POA: Diagnosis not present

## 2023-03-16 DIAGNOSIS — D123 Benign neoplasm of transverse colon: Secondary | ICD-10-CM | POA: Diagnosis not present

## 2023-03-16 DIAGNOSIS — K219 Gastro-esophageal reflux disease without esophagitis: Secondary | ICD-10-CM | POA: Diagnosis not present

## 2023-03-16 DIAGNOSIS — Z87891 Personal history of nicotine dependence: Secondary | ICD-10-CM | POA: Diagnosis not present

## 2023-03-16 DIAGNOSIS — K648 Other hemorrhoids: Secondary | ICD-10-CM | POA: Diagnosis not present

## 2023-03-16 DIAGNOSIS — Z951 Presence of aortocoronary bypass graft: Secondary | ICD-10-CM | POA: Diagnosis not present

## 2023-03-16 DIAGNOSIS — Z1211 Encounter for screening for malignant neoplasm of colon: Secondary | ICD-10-CM | POA: Diagnosis not present

## 2023-03-16 DIAGNOSIS — K644 Residual hemorrhoidal skin tags: Secondary | ICD-10-CM | POA: Diagnosis not present

## 2023-03-16 LAB — BASIC METABOLIC PANEL
Anion gap: 10 (ref 5–15)
BUN: 19 mg/dL (ref 8–23)
CO2: 24 mmol/L (ref 22–32)
Calcium: 9.3 mg/dL (ref 8.9–10.3)
Chloride: 101 mmol/L (ref 98–111)
Creatinine, Ser: 0.77 mg/dL (ref 0.44–1.00)
GFR, Estimated: >60 mL/min (ref >60)
Glucose, Bld: 108 mg/dL — ABNORMAL HIGH (ref 70–99)
Potassium: 4 mmol/L (ref 3.5–5.1)
Sodium: 135 mmol/L (ref 135–145)

## 2023-03-19 ENCOUNTER — Ambulatory Visit (HOSPITAL_COMMUNITY)
Admission: RE | Admit: 2023-03-19 | Discharge: 2023-03-19 | Disposition: A | Discharge status: Home / Self Care | Payer: Medicare Other | Attending: Gastroenterology | Admitting: Gastroenterology

## 2023-03-19 ENCOUNTER — Ambulatory Visit (HOSPITAL_BASED_OUTPATIENT_CLINIC_OR_DEPARTMENT_OTHER): Payer: Medicare Other | Admitting: Anesthesiology

## 2023-03-19 ENCOUNTER — Ambulatory Visit (HOSPITAL_COMMUNITY): Payer: Medicare Other | Admitting: Anesthesiology

## 2023-03-19 ENCOUNTER — Encounter (HOSPITAL_COMMUNITY)
Admission: RE | Disposition: A | Discharge status: Home / Self Care | Payer: Self-pay | Source: Home / Self Care | Attending: Gastroenterology

## 2023-03-19 ENCOUNTER — Encounter (HOSPITAL_COMMUNITY): Payer: Self-pay | Admitting: Gastroenterology

## 2023-03-19 ENCOUNTER — Other Ambulatory Visit: Payer: Self-pay

## 2023-03-19 DIAGNOSIS — K5730 Diverticulosis of large intestine without perforation or abscess without bleeding: Secondary | ICD-10-CM | POA: Diagnosis not present

## 2023-03-19 DIAGNOSIS — Z87891 Personal history of nicotine dependence: Secondary | ICD-10-CM | POA: Insufficient documentation

## 2023-03-19 DIAGNOSIS — Z1211 Encounter for screening for malignant neoplasm of colon: Secondary | ICD-10-CM | POA: Insufficient documentation

## 2023-03-19 DIAGNOSIS — K644 Residual hemorrhoidal skin tags: Secondary | ICD-10-CM | POA: Insufficient documentation

## 2023-03-19 DIAGNOSIS — I10 Essential (primary) hypertension: Secondary | ICD-10-CM | POA: Diagnosis not present

## 2023-03-19 DIAGNOSIS — Z951 Presence of aortocoronary bypass graft: Secondary | ICD-10-CM | POA: Diagnosis not present

## 2023-03-19 DIAGNOSIS — D123 Benign neoplasm of transverse colon: Secondary | ICD-10-CM | POA: Diagnosis not present

## 2023-03-19 DIAGNOSIS — K648 Other hemorrhoids: Secondary | ICD-10-CM | POA: Diagnosis not present

## 2023-03-19 DIAGNOSIS — Z139 Encounter for screening, unspecified: Secondary | ICD-10-CM | POA: Diagnosis not present

## 2023-03-19 DIAGNOSIS — Z79899 Other long term (current) drug therapy: Secondary | ICD-10-CM | POA: Diagnosis not present

## 2023-03-19 DIAGNOSIS — Z860100 Personal history of colon polyps, unspecified: Secondary | ICD-10-CM

## 2023-03-19 DIAGNOSIS — K219 Gastro-esophageal reflux disease without esophagitis: Secondary | ICD-10-CM | POA: Insufficient documentation

## 2023-03-19 DIAGNOSIS — I2510 Atherosclerotic heart disease of native coronary artery without angina pectoris: Secondary | ICD-10-CM | POA: Insufficient documentation

## 2023-03-19 DIAGNOSIS — K635 Polyp of colon: Secondary | ICD-10-CM

## 2023-03-19 DIAGNOSIS — K581 Irritable bowel syndrome with constipation: Secondary | ICD-10-CM | POA: Insufficient documentation

## 2023-03-19 DIAGNOSIS — K449 Diaphragmatic hernia without obstruction or gangrene: Secondary | ICD-10-CM | POA: Diagnosis not present

## 2023-03-19 HISTORY — PX: SUBMUCOSAL LIFTING INJECTION: SHX6855

## 2023-03-19 HISTORY — PX: POLYPECTOMY: SHX5525

## 2023-03-19 HISTORY — PX: COLONOSCOPY WITH PROPOFOL: SHX5780

## 2023-03-19 HISTORY — PX: SUBMUCOSAL TATTOO INJECTION: SHX6856

## 2023-03-19 LAB — HM COLONOSCOPY

## 2023-03-19 SURGERY — COLONOSCOPY WITH PROPOFOL
Anesthesia: General

## 2023-03-19 MED ORDER — PHENYLEPHRINE 80 MCG/ML (10ML) SYRINGE FOR IV PUSH (FOR BLOOD PRESSURE SUPPORT)
PREFILLED_SYRINGE | INTRAVENOUS | Status: AC
  Filled 2023-03-19: qty 10

## 2023-03-19 MED ORDER — SODIUM CHLORIDE 0.9% FLUSH: 3.0000 mL | Freq: Two times a day (BID) | INTRAVENOUS | Status: DC

## 2023-03-19 MED ORDER — GLYCOPYRROLATE PF 0.2 MG/ML IJ SOSY
PREFILLED_SYRINGE | INTRAMUSCULAR | Status: DC | PRN
  Administered 2023-03-19: .2 mg via INTRAVENOUS

## 2023-03-19 MED ORDER — EPHEDRINE 5 MG/ML INJ
INTRAVENOUS | Status: AC
  Filled 2023-03-19: qty 5

## 2023-03-19 MED ORDER — SPOT INK MARKER SYRINGE KIT
PACK | SUBMUCOSAL | Status: AC
  Filled 2023-03-19: qty 5

## 2023-03-19 MED ORDER — LIDOCAINE HCL (PF) 2 % IJ SOLN
INTRAMUSCULAR | Status: DC | PRN
  Administered 2023-03-19: 50 mg via INTRADERMAL

## 2023-03-19 MED ORDER — SODIUM CHLORIDE 0.9% FLUSH: 3.0000 mL | INTRAVENOUS | Status: DC | PRN

## 2023-03-19 MED ORDER — SODIUM CHLORIDE 0.9% FLUSH
3.0000 mL | INTRAVENOUS | Status: DC | PRN
Start: 2023-03-19 — End: 2023-03-19

## 2023-03-19 MED ORDER — PHENYLEPHRINE 80 MCG/ML (10ML) SYRINGE FOR IV PUSH (FOR BLOOD PRESSURE SUPPORT)
PREFILLED_SYRINGE | INTRAVENOUS | Status: DC | PRN
  Administered 2023-03-19 (×2): 80 ug via INTRAVENOUS
  Administered 2023-03-19: 160 ug via INTRAVENOUS

## 2023-03-19 MED ORDER — LACTATED RINGERS IV SOLN: INTRAVENOUS | Status: DC | PRN

## 2023-03-19 MED ORDER — PROPOFOL 500 MG/50ML IV EMUL
INTRAVENOUS | Status: DC | PRN
  Administered 2023-03-19: 150 ug/kg/min via INTRAVENOUS

## 2023-03-19 MED ORDER — SPOT INK MARKER SYRINGE KIT
PACK | SUBMUCOSAL | Status: DC | PRN
  Administered 2023-03-19: 1.2 mL via SUBMUCOSAL

## 2023-03-19 MED ORDER — PROPOFOL 10 MG/ML IV BOLUS
INTRAVENOUS | Status: DC | PRN
  Administered 2023-03-19: 50 mg via INTRAVENOUS
  Administered 2023-03-19: 100 mg via INTRAVENOUS

## 2023-03-19 MED ORDER — GLYCOPYRROLATE PF 0.2 MG/ML IJ SOSY
PREFILLED_SYRINGE | INTRAMUSCULAR | Status: AC
  Filled 2023-03-19: qty 1

## 2023-03-19 MED ORDER — EPHEDRINE SULFATE-NACL 50-0.9 MG/10ML-% IV SOSY
PREFILLED_SYRINGE | INTRAVENOUS | Status: DC | PRN
  Administered 2023-03-19 (×2): 5 mg via INTRAVENOUS

## 2023-03-19 NOTE — Transfer of Care (Signed)
Immediate Anesthesia Transfer of Care Note  Patient: [name] [name]  Procedure(s) Performed: COLONOSCOPY WITH PROPOFOL POLYPECTOMY SUBMUCOSAL LIFTING INJECTION SUBMUCOSAL TATTOO INJECTION  Patient Location: Endoscopy Unit  Anesthesia Type:General  Level of Consciousness: drowsy  Airway & Oxygen Therapy: Patient Spontanous Breathing  Post-op Assessment: Report given to RN and Post -op Vital signs reviewed and stable  Post vital signs: Reviewed and stable  Last Vitals:  Vitals Value Taken Time  BP 117/45 03/19/23 1338  Temp 36.4 C 03/19/23 1338  Pulse 70 03/19/23 1338  Resp 18 03/19/23 1338  SpO2 94 % 03/19/23 1338    Last Pain:  Vitals:   03/19/23 1338  TempSrc: Oral  PainSc:       Patients Stated Pain Goal: 9 (03/19/23 1224)  Complications: No notable events documented.

## 2023-03-19 NOTE — Anesthesia Postprocedure Evaluation (Signed)
Anesthesia Post Note  Patient: [name] [name]  Procedure(s) Performed: COLONOSCOPY WITH PROPOFOL POLYPECTOMY SUBMUCOSAL LIFTING INJECTION SUBMUCOSAL TATTOO INJECTION  Patient location during evaluation: PACU Anesthesia Type: General Level of consciousness: awake and alert Pain management: pain level controlled Vital Signs Assessment: post-procedure vital signs reviewed and stable Respiratory status: spontaneous breathing, nonlabored ventilation, respiratory function stable and patient connected to nasal cannula oxygen Cardiovascular status: blood pressure returned to baseline and stable Postop Assessment: no apparent nausea or vomiting Anesthetic complications: no   There were no known notable events for this encounter.   Last Vitals:  Vitals:   03/19/23 1343 03/19/23 1346  BP: (!) 80/59 (!) 113/53  Pulse: 72 75  Resp: 16 19  Temp:    SpO2: 93% 98%    Last Pain:  Vitals:   03/19/23 1346  TempSrc:   PainSc: 0-No pain                 Angelmarie Ponzo L Ermagene Saidi

## 2023-03-19 NOTE — Op Note (Signed)
[facility] Patient Name: [name] [name] Procedure Date: 03/19/2023 12:43 PM MRN: [id number] Date of Birth: Dec 17, 1950 Attending MD: Sanjuan Dame , MD, [id number] CSN: [id number] Age: 73 Admit Type: Outpatient Procedure:                Colonoscopy Indications:              High risk colon cancer surveillance: Personal                            history of colonic polyps Providers:                Sanjuan Dame, MD, Francoise Ceo RN, RN, Judeth Cornfield.                            Jessee Avers, Technician Referring MD:              Medicines:                Monitored Anesthesia Care Complications:            No immediate complications. Estimated Blood Loss:     Estimated blood loss was minimal. Procedure:                Pre-Anesthesia Assessment:                           - Prior to the procedure, a History and Physical                            was performed, and patient medications and                            allergies were reviewed. The patient's tolerance of                            previous anesthesia was also reviewed. The risks                            and benefits of the procedure and the sedation                            options and risks were discussed with the patient.                            All questions were answered, and informed consent                            was obtained. Prior Anticoagulants: The patient has                            taken no anticoagulant or antiplatelet agents                            except for aspirin. ASA Grade Assessment: II - A  patient with mild systemic disease. After reviewing                            the risks and benefits, the patient was deemed in                            satisfactory condition to undergo the procedure.                           After obtaining informed consent, the colonoscope                            was passed under direct vision. Throughout the                             procedure, the patient's blood pressure, pulse, and                            oxygen saturations were monitored continuously. The                            [id number]-[id number] ([id number]) scope was introduced through                            the anus and advanced to the the cecum, identified                            by appendiceal orifice and ileocecal valve. The                            colonoscopy was performed without difficulty. The                            patient tolerated the procedure well. The quality                            of the bowel preparation was evaluated using the                            BBPS [facility] Bowel Preparation Scale) with scores                            of: Right Colon = 3, Transverse Colon = 3 and Left                            Colon = 3 (entire mucosa seen well with no residual                            staining, small fragments of stool or opaque                            liquid). The total BBPS score equals 9. The  ileocecal valve, appendiceal orifice, and rectum                            were photographed. Scope In: 12:57:08 PM Scope Out: 1:35:34 PM Scope Withdrawal Time: 0 hours 30 minutes 55 seconds  Total Procedure Duration: 0 hours 38 minutes 26 seconds  Findings:      A 25 mm polyp was found in the transverse colon. The polyp was flat.       Preparations were made for mucosal resection. Demarcation of the lesion       was performed with high-definition white light and narrow band imaging       to clearly identify the boundaries of the lesion. Eleview was injected       to raise the lesion. Piecemeal mucosal resection using a snare was       performed. Resection and retrieval were complete. Resected tissue       margins were examined and clear of polyp tissue. Area was tattooed with       an injection of 1 mL of Spot (carbon black).      Scattered diverticula were found in the left colon.      Non-bleeding external  and internal hemorrhoids were found during       retroflexion. The hemorrhoids were small. Impression:               - One 25 mm polyp in the transverse colon, removed                            with mucosal resection. Resected and retrieved.                            Tattooed. Polypectomy site is located behind the                            fold proximal to the Tattoo                           - Diverticulosis in the left colon.                           - Non-bleeding external and internal hemorrhoids.                           - Mucosal resection was performed. Resection and                            retrieval were complete. Moderate Sedation:      Per Anesthesia Care Recommendation:           - Patient has a contact number available for                            emergencies. The signs and symptoms of potential                            delayed complications were discussed with the  patient. Return to normal activities tomorrow.                            Written discharge instructions were provided to the                            patient.                           - High fiber diet.                           - Continue present medications.                           - Await pathology results.                           - Repeat colonoscopy in 1 year for surveillance                            based on pathology results. Piecemeal resection                           - Return to GI clinic as previously scheduled. Procedure Code(s):        --- Professional ---                           [phone], Colonoscopy, flexible; with endoscopic                            mucosal resection Diagnosis Code(s):        --- Professional ---                           Z86.010, Personal history of colonic polyps                           D12.3, Benign neoplasm of transverse colon (hepatic                            flexure or splenic flexure)                           K64.8,  Other hemorrhoids                           K57.30, Diverticulosis of large intestine without                            perforation or abscess without bleeding CPT copyright 2022 American Medical Association. All rights reserved. The codes documented in this report are preliminary and upon coder review may  be revised to meet current compliance requirements. Sanjuan Dame, MD Sanjuan Dame, MD 03/19/2023 1:51:18 PM This report has been signed electronically. Number of Addenda: 0

## 2023-03-19 NOTE — Discharge Instructions (Signed)
  Discharge instructions Please read the instructions outlined below and refer to this sheet in the next few weeks. These discharge instructions provide you with general information on caring for yourself after you leave the hospital. Your doctor may also give you specific instructions. While your treatment has been planned according to the most current medical practices available, unavoidable complications occasionally occur. If you have any problems or questions after discharge, please call your doctor. ACTIVITY You may resume your regular activity but move at a slower pace for the next 24 hours.  Take frequent rest periods for the next 24 hours.  Walking will help expel (get rid of) the air and reduce the bloated feeling in your abdomen.  No driving for 24 hours (because of the anesthesia (medicine) used during the test).  You may shower.  Do not sign any important legal documents or operate any machinery for 24 hours (because of the anesthesia used during the test).  NUTRITION Drink plenty of fluids.  You may resume your normal diet.  Begin with a light meal and progress to your normal diet.  Avoid alcoholic beverages for 24 hours or as instructed by your caregiver.  MEDICATIONS You may resume your normal medications unless your caregiver tells you otherwise.  WHAT YOU CAN EXPECT TODAY You may experience abdominal discomfort such as a feeling of fullness or "gas" pains.  FOLLOW-UP Your doctor will discuss the results of your test with you.  SEEK IMMEDIATE MEDICAL ATTENTION IF ANY OF THE FOLLOWING OCCUR: Excessive nausea (feeling sick to your stomach) and/or vomiting.  Severe abdominal pain and distention (swelling).  Trouble swallowing.  Temperature over 101 F (37.8 C).  Rectal bleeding or vomiting of blood.    AVOID  using high dose aspirin including Goody/BC powders, NSAIDs such as Aleve, ibuprofen, naproxen, Motrin, Voltaren or Advil (even the topical ones) for 2 weeks    I  hope you have a great rest of your week!   Vista Lawman , M.D.. Gastroenterology and Hepatology [facility]

## 2023-03-19 NOTE — Anesthesia Procedure Notes (Addendum)
Date/Time: 03/19/2023 12:59 PM  Performed by: Julian Reil, CRNAPre-anesthesia Checklist: Patient identified, Emergency Drugs available, Suction available and Patient being monitored Patient Re-evaluated:Patient Re-evaluated prior to induction Oxygen Delivery Method: Non-rebreather mask Induction Type: IV induction Placement Confirmation: positive ETCO2

## 2023-03-19 NOTE — Anesthesia Preprocedure Evaluation (Addendum)
Anesthesia Evaluation  Patient identified by MRN, date of birth, ID band Patient awake    Reviewed: Allergy & Precautions, H&P , NPO status , Patient's Chart, lab work & pertinent test results, reviewed documented beta blocker date and time   History of Anesthesia Complications Negative for: history of anesthetic complications  Airway Mallampati: II  TM Distance: >3 FB Neck ROM: full    Dental no notable dental hx. (+) Dental Advidsory Given, Teeth Intact   Pulmonary former smoker   Pulmonary exam normal breath sounds clear to auscultation       Cardiovascular Exercise Tolerance: Good hypertension, Pt. on home beta blockers and Pt. on medications + angina at rest + CAD (cath: 80% L main disease, normal LVF) and + CABG  Normal cardiovascular exam Rhythm:Regular Rate:Bradycardia     Neuro/Psych  PSYCHIATRIC DISORDERS Anxiety Depression    negative neurological ROS  negative psych ROS   GI/Hepatic negative GI ROS, Neg liver ROS,GERD  Medicated and Controlled,,  Endo/Other  negative endocrine ROS    Renal/GU negative Renal ROS  negative genitourinary   Musculoskeletal  (+) Arthritis , Osteoarthritis,    Abdominal  (+) + obese  Peds  Hematology negative hematology ROS (+)   Anesthesia Other Findings   Reproductive/Obstetrics negative OB ROS                              Anesthesia Physical Anesthesia Plan  ASA: 3  Anesthesia Plan: General   Post-op Pain Management: Minimal or no pain anticipated   Induction: Intravenous  PONV Risk Score and Plan: Propofol infusion  Airway Management Planned: Nasal Cannula  Additional Equipment:   Intra-op Plan:   Post-operative Plan:   Informed Consent: I have reviewed the patients History and Physical, chart, labs and discussed the procedure including the risks, benefits and alternatives for the proposed anesthesia with the patient or  authorized representative who has indicated his/her understanding and acceptance.     Dental advisory given  Plan Discussed with: CRNA  Anesthesia Plan Comments:          Anesthesia Quick Evaluation
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
## 2023-03-20 ENCOUNTER — Encounter (HOSPITAL_COMMUNITY): Payer: Self-pay | Admitting: Gastroenterology

## 2023-03-20 ENCOUNTER — Encounter (INDEPENDENT_AMBULATORY_CARE_PROVIDER_SITE_OTHER): Payer: Self-pay | Admitting: *Deleted

## 2023-03-20 ENCOUNTER — Encounter (INDEPENDENT_AMBULATORY_CARE_PROVIDER_SITE_OTHER): Payer: Self-pay | Admitting: Gastroenterology

## 2023-03-20 LAB — SURGICAL PATHOLOGY

## 2023-03-21 ENCOUNTER — Ambulatory Visit: Payer: Medicare Other | Admitting: Physician Assistant

## 2023-03-23 ENCOUNTER — Encounter (INDEPENDENT_AMBULATORY_CARE_PROVIDER_SITE_OTHER): Payer: Self-pay | Admitting: *Deleted

## 2023-03-26 ENCOUNTER — Ambulatory Visit (HOSPITAL_COMMUNITY): Payer: Medicare Other

## 2023-03-26 ENCOUNTER — Encounter: Payer: Self-pay | Admitting: Cardiovascular Disease

## 2023-03-27 ENCOUNTER — Other Ambulatory Visit (INDEPENDENT_AMBULATORY_CARE_PROVIDER_SITE_OTHER): Payer: Self-pay | Admitting: Gastroenterology

## 2023-03-27 ENCOUNTER — Encounter (INDEPENDENT_AMBULATORY_CARE_PROVIDER_SITE_OTHER): Payer: Self-pay | Admitting: Gastroenterology

## 2023-03-27 DIAGNOSIS — K219 Gastro-esophageal reflux disease without esophagitis: Secondary | ICD-10-CM

## 2023-03-27 MED ORDER — PANTOPRAZOLE SODIUM 40 MG PO TBEC: 40.0000 mg | DELAYED_RELEASE_TABLET | Freq: Every day | ORAL | 1 refills | Status: DC

## 2023-03-27 NOTE — Telephone Encounter (Signed)
Last seen 01/22/2023. Please see message from patient below. Thanks,

## 2023-03-30 ENCOUNTER — Inpatient Hospital Stay: Payer: Medicare Other

## 2023-04-02 ENCOUNTER — Ambulatory Visit (INDEPENDENT_AMBULATORY_CARE_PROVIDER_SITE_OTHER): Payer: Medicare Other | Admitting: Urology

## 2023-04-02 ENCOUNTER — Telehealth: Payer: Self-pay

## 2023-04-02 ENCOUNTER — Telehealth: Payer: Self-pay | Admitting: Urology

## 2023-04-02 DIAGNOSIS — R310 Gross hematuria: Secondary | ICD-10-CM

## 2023-04-02 LAB — URINALYSIS, ROUTINE W REFLEX MICROSCOPIC
Bilirubin, UA: NEGATIVE
Glucose, UA: NEGATIVE
Ketones, UA: NEGATIVE
Leukocytes,UA: NEGATIVE
Nitrite, UA: NEGATIVE
Protein,UA: NEGATIVE
RBC, UA: NEGATIVE
Specific Gravity, UA: 1.02 (ref 1.005–1.030)
Urobilinogen, Ur: 0.2 mg/dL (ref 0.2–1.0)
pH, UA: 6 (ref 5.0–7.5)

## 2023-04-02 NOTE — Telephone Encounter (Signed)
Patient has a strong odor to urine,  urgency in urination , possible some blood in urine,  and the color is off.  Pt coming in at 2pm to drop of urine sample she wants you to do a culture of the sample.

## 2023-04-02 NOTE — Telephone Encounter (Signed)
Patient has a strong odor to urine,  urgency in urination , possible some blood in urine,  and the color is off.  Pt coming in at 2pm to drop of urine sample she wants you to do a culture of the sample.      Patient was made aware and voiced understanding. "Patient presents today with complaints of  strong odor to urine, urgency in urination , possible some blood in urine, and the color is off . UA and Culture done today.  Dr. Mena Goes reviewed results and no Antibiotic .  Patient aware of MD recommendations and that we will reach out with culture results. "   Kennyth Lose, CMA

## 2023-04-02 NOTE — Progress Notes (Signed)
Patient presents today with complaints of  strong odor to urine, urgency in urination , possible some blood in urine, and the color is off . UA and Culture done today.  Dr. Mena Goes reviewed results and no Antibiotic .  Patient aware of MD recommendations and that we will reach out with culture results.    ZOXWRUEA, CMA

## 2023-04-06 ENCOUNTER — Other Ambulatory Visit: Payer: Self-pay | Admitting: Urology

## 2023-04-06 LAB — URINE CULTURE

## 2023-04-06 NOTE — Telephone Encounter (Signed)
Patient called back with dysuria and urine odor. I sent doxycycline 100 mg bid x 5 days.

## 2023-04-09 ENCOUNTER — Ambulatory Visit (HOSPITAL_COMMUNITY): Payer: Medicare Other

## 2023-04-09 ENCOUNTER — Telehealth: Payer: Self-pay | Admitting: Urology

## 2023-04-09 DIAGNOSIS — N390 Urinary tract infection, site not specified: Secondary | ICD-10-CM

## 2023-04-09 MED ORDER — NITROFURANTOIN MONOHYD MACRO 100 MG PO CAPS
100.0000 mg | ORAL_CAPSULE | Freq: Two times a day (BID) | ORAL | 0 refills | Status: DC
Start: 2023-04-09 — End: 2023-04-24

## 2023-04-09 NOTE — Telephone Encounter (Signed)
 Per verbal from MD McKenzie send in Macrobid for urine culture results notified Pt via Mychart

## 2023-04-09 NOTE — Telephone Encounter (Signed)
 Got urine results and would like an antibiotic called in

## 2023-04-23 ENCOUNTER — Other Ambulatory Visit (HOSPITAL_COMMUNITY): Payer: Self-pay | Admitting: Student

## 2023-04-23 DIAGNOSIS — N281 Cyst of kidney, acquired: Secondary | ICD-10-CM

## 2023-04-24 ENCOUNTER — Ambulatory Visit (HOSPITAL_COMMUNITY)
Admission: RE | Admit: 2023-04-24 | Discharge: 2023-04-24 | Disposition: A | Discharge status: Home / Self Care | Payer: Medicare Other | Source: Ambulatory Visit | Attending: Urology | Admitting: Urology

## 2023-04-24 ENCOUNTER — Other Ambulatory Visit: Payer: Self-pay

## 2023-04-24 DIAGNOSIS — I10 Essential (primary) hypertension: Secondary | ICD-10-CM | POA: Diagnosis not present

## 2023-04-24 DIAGNOSIS — K589 Irritable bowel syndrome without diarrhea: Secondary | ICD-10-CM | POA: Insufficient documentation

## 2023-04-24 DIAGNOSIS — N281 Cyst of kidney, acquired: Secondary | ICD-10-CM | POA: Diagnosis not present

## 2023-04-24 DIAGNOSIS — I700 Atherosclerosis of aorta: Secondary | ICD-10-CM | POA: Insufficient documentation

## 2023-04-24 DIAGNOSIS — I2510 Atherosclerotic heart disease of native coronary artery without angina pectoris: Secondary | ICD-10-CM | POA: Diagnosis not present

## 2023-04-24 DIAGNOSIS — K449 Diaphragmatic hernia without obstruction or gangrene: Secondary | ICD-10-CM | POA: Diagnosis not present

## 2023-04-24 DIAGNOSIS — Z951 Presence of aortocoronary bypass graft: Secondary | ICD-10-CM | POA: Diagnosis not present

## 2023-04-24 LAB — CBC
HCT: 42 % (ref 36.0–46.0)
Hemoglobin: 14 g/dL (ref 12.0–15.0)
MCH: 28.2 pg (ref 26.0–34.0)
MCHC: 33.3 g/dL (ref 30.0–36.0)
MCV: 84.7 fL (ref 80.0–100.0)
Platelets: 392 10*3/uL (ref 150–400)
RBC: 4.96 MIL/uL (ref 3.87–5.11)
RDW: 13.7 % (ref 11.5–15.5)
WBC: 7.5 10*3/uL (ref 4.0–10.5)
nRBC: 0 % (ref 0.0–0.2)

## 2023-04-24 LAB — PROTIME-INR
INR: 0.9 (ref 0.8–1.2)
Prothrombin Time: 12.6 s (ref 11.4–15.2)

## 2023-04-24 MED ORDER — FENTANYL CITRATE (PF) 100 MCG/2ML IJ SOLN
INTRAMUSCULAR | Status: AC | PRN
Start: 2023-04-24 — End: 2023-04-24
  Administered 2023-04-24 (×2): 25 ug via INTRAVENOUS

## 2023-04-24 MED ORDER — MIDAZOLAM HCL 2 MG/2ML IJ SOLN
INTRAMUSCULAR | Status: AC | PRN
  Administered 2023-04-24 (×2): .5 mg via INTRAVENOUS

## 2023-04-24 MED ORDER — FENTANYL CITRATE (PF) 100 MCG/2ML IJ SOLN
INTRAMUSCULAR | Status: AC
  Filled 2023-04-24: qty 2

## 2023-04-24 MED ORDER — MIDAZOLAM HCL 2 MG/2ML IJ SOLN
INTRAMUSCULAR | Status: AC
Start: 2023-04-24 — End: ?
  Filled 2023-04-24: qty 2

## 2023-04-24 NOTE — Procedures (Signed)
 Interventional Radiology Procedure Note  Procedure: [place] LEFT RENAL CYST ASPIRATION    Complications: None  Estimated Blood Loss:  0  Findings: 170CC CLEAR CYST FLUID SENT FOR CYTO    Sharen Counter, MD

## 2023-04-24 NOTE — Sedation Documentation (Signed)
 drawn by Dr. Miles Costain during IR procedure from LEFT renal cyst. Clear, light yellow.
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
## 2023-04-25 LAB — CYTOLOGY - NON PAP

## 2023-04-26 ENCOUNTER — Encounter: Payer: Self-pay | Admitting: Urology

## 2023-05-10 ENCOUNTER — Ambulatory Visit: Payer: Medicare Other | Attending: Cardiovascular Disease | Admitting: Cardiovascular Disease

## 2023-05-10 ENCOUNTER — Encounter: Payer: Self-pay | Admitting: Cardiovascular Disease

## 2023-05-10 VITALS — BP 128/84 | HR 57 | Ht 64.0 in | Wt 170.0 lb

## 2023-05-10 DIAGNOSIS — G8929 Other chronic pain: Secondary | ICD-10-CM | POA: Diagnosis not present

## 2023-05-10 DIAGNOSIS — I2510 Atherosclerotic heart disease of native coronary artery without angina pectoris: Secondary | ICD-10-CM

## 2023-05-10 DIAGNOSIS — Z951 Presence of aortocoronary bypass graft: Secondary | ICD-10-CM

## 2023-05-10 DIAGNOSIS — I10 Essential (primary) hypertension: Secondary | ICD-10-CM

## 2023-05-10 DIAGNOSIS — M546 Pain in thoracic spine: Secondary | ICD-10-CM

## 2023-05-10 DIAGNOSIS — I700 Atherosclerosis of aorta: Secondary | ICD-10-CM | POA: Diagnosis not present

## 2023-05-10 DIAGNOSIS — N281 Cyst of kidney, acquired: Secondary | ICD-10-CM | POA: Diagnosis not present

## 2023-05-10 DIAGNOSIS — E785 Hyperlipidemia, unspecified: Secondary | ICD-10-CM | POA: Diagnosis not present

## 2023-05-10 NOTE — Progress Notes (Signed)
 Patient ID: [name] [name], female   DOB: [birth year], 73 y.o.   MRN: [id number]       HPI: [name] [name] is a 73 y.o. female who presents to the office today for a 5 month follow up cardiology evaluation.  [name] [name] has CAD and underwent cardiac catheterization on 05/22/2011 which revealed 70% distal left main stenosis, 60% ostial circumflex stenosis and mild RCA disease.  She underwent CABG surgery x2 by Dr. Morton Peters and had a LIMA placed to her LAD, and vein to the OM 2 vessel.  Ejection fraction was 50-55%.  In October 2014, she  noticed some vague chest fullness and underwent a nuclear perfusion study which demonstrated hyperdynamic LV with an EF of 83% post stress.  She did not have ECG changes; she had normal perfusion and function.  When I saw her in October 2016  she has been without chest pain.  She has a history of vitamin D insufficiency in the past which had improved.  She has issues with muscle spasm for which and was taking Flexeril on an as-needed basis.  She has GERD for which he takes Protonix on an as-needed basis.  She has been maintained on Lipitor 10 mg for hyperlipidemia.  She had issues with diarrhea.  At that time had stopped her losartan.   She underwent an echo Doppler study in October 2016 which showed an EF of 65-70% with grade 1 diastolic dysfunction.  There was mild TR, and otherwise no significant valvular pathology.  She is unaware of any palpitations.  She denies PND, orthopnea.  She had blood work one month ago by her primary physician and her TSH was 1.5.  BUN and creatinine were 15 and 0.73, respectively.  LFTs were normal, total cholesterol 174 with triglycerides 115, HDL 68, LDL 83.    When I saw her in October 2018 she was having issues with insomnia and was concerned that this could be secondary to losartan.  She was using Benadryl to help her sleep.  Typically she has been going to bed at 9:30, but typically may stay awake and total 1 AM.  If she goes to bed at 1 AM  she falls asleep much more quickly.  She has noticed poor sleep, frequent awakenings, daytime fatigue, and snoring.  She has been sleeping with the head of the bed elevated.  She has an extreme overbite.  During that evaluation, I recommended a trial of zolpidem and to help improve sleep initiation and recommend she undergo a sleep study.  I had not seen her since October 2018 and subsequently saw her in February 2021. In the interim she had  been evaluated Corine Shelter, PA-C, Rennis Harding, NP and recently by Dr. Diona Browner.  In the past, her losartan was discontinued due to complaints of swelling in her throat.  She was started on hydralazine but ultimately developed issues with hydralazine leading to its discontinuance.  More recently she was started on spironolactone and was concerned perhaps that this may be contributing to swelling.  She ultimately underwent a home sleep study in January 2021 which demonstrated mild overall sleep apnea with an AHI of 5.3/h.  Oxygen nadir was 88%.  Because of concerns of her blood pressure medication I saw her on March 20, 2019 for follow-up evaluation with me and has requested that I continue to follow her cardiologically.  During that evaluation, her blood pressure was elevated and due to intolerances to numerous medications I added doxazosin initially 1  mg at bedtime for 2 weeks with potential titration to 2 mg.  I also discussed options of CPAP therapy versus oral appliance.  She wished to pursue a customized oral appliance as initial treatment and I referred her to Dr. Myrtis Ser for evaluation.  I recommended aggressive lipid therapy with target LDL less than 70.  I saw her in June 2021 and over the prior several months her leg swelling had improved but she had developed pain in the back of her calf particularly on the right with cramping. She denied any shortness of breath.  She denied palpitations or chest pain.  Subsequent to that evaluation she had seen Dr. Yancey Flemings of  GI for GERD and dyspepsia.  I saw in November 25, 2019.  At that time, she denied any chest pain.  She never followed up for oral appliance evaluation.  She does not believe she is sleeping exceptionally well.  She continues to be on atorvastatin for hyperlipidemia, pantoprazole for GERD and is on spironolactone for blood pressure and swelling.  She now sees Dr. Nadene Rubins at [facility] for primary care.    When I saw [name] on December 22, 2020 she was admitting to experiencing vague chest pressure occurring intermittently.  She was able to walk.  She had issues with a UTI and required antibiotic therapy most recently with Augmentin.    She has been sleeping with her head propped up.  She admits to daytime fatigue.  She underwent a echo Doppler study on November 30, 2020 which showed an EF of 60 to 65% with grade 1 diastolic dysfunction.  Laboratory in May 2022 had shown an LDL cholesterol at 83 and with her known CAD I recommended further titration of atorvastatin from 20 mg to 40 mg with target LDL less than 70.  She continues to be on chlorthalidone 12.5 mg and spironolactone 25 mg twice a day.  With her occasional chest pressure and known CAD 9 years status post CABG revascularization I recommended she undergo a Lexiscan Myoview study for further evaluation and also discussed the possibility of reassessment of her sleep apnea since she continues to have issues with sleep.  She underwent a Lexiscan Myoview study on December 28, 2020 which was normal.  I saw her on March 21, 2022.  She has had some issues with blood pressure lability and her primary care provider had recently started losartan.  Apparently she is no longer seeing Dr. Rebecca Eaton and is now seeing Dr. Mayford Knife at [facility] in [place].   She has had some issues with arthritis of her left shoulder for which she has taken tramadol one half of a 50 mg pill on a as needed basis.  Presently she is on atorvastatin 40 mg daily for  hyperlipidemia, and  spironolactone 25 mg daily.  Remotely, she had been on losartan.  She presented to the emergency room in April with hematuria.  She ultimately had seen Dr. Bjorn Pippin who referred her for renal stone study which showed increase in size of an exophytic cyst in the lower pole of the left kidney, small hiatal hernia, and there was evidence for atherosclerotic calcification and aortic branch vessels without aneurysm.  An MRI revealed revealed large renal cysts in the mid left kidney with fluid signal.  Her abdominal aorta was normal caliber.  She had benign hepatic cysts.  He also has been evaluated by Carlyon Shadow, NP and also saw Marjorie Smolder, our pharmacist.  She was started on a  trial of clonidine which she did not tolerate.  I last saw her on December 20, 2022 at which time she was feeling well but was having some issues with chronic low back pain.  She had been tentatively scheduled to undergo breast reduction surgery tomorrow but canceled this since her back pain improved.  He is scheduled to go to [facility] tomorrow to have follow-up laboratory per hematology who is following her previously we elevated platelet count.  Is any chest pain or shortness of breath.  Her last nuclear perfusion study was 2 years ago which remained normal.  She was concerned about the MRI stating there was aortic calcification.    Since I last saw her, she is followed by Dr. Mitzi Hansen for primary care.  She has had issues with insomnia.  She recently had a kidney cyst aspirated.  She has issues with shoulder arthritis.  She continues to be on losartan 50 mg and spironolactone 25 mg for hypertension.  She is on atorvastatin 40 mg for hyperlipidemia.  She takes pregabalin for pain.  She presents for evaluation.   Past Surgical History:  Procedure Laterality Date   ABDOMINAL HYSTERECTOMY  2003   BONE BIOPSY     COLONOSCOPY  2019   Dr. Marina Goodell   COLONOSCOPY WITH PROPOFOL N/A 03/19/2023    Procedure: COLONOSCOPY WITH PROPOFOL;  Surgeon: Franky Macho, MD;  [facility];  Service: Endoscopy;  Laterality: N/A;  1:30PM;ASA 1-2, pt can't come earlier - transportation   CORONARY ARTERY BYPASS GRAFT  05/26/2011   Procedure: CORONARY ARTERY BYPASS GRAFTING (CABG);  Surgeon: Kerin Perna, MD;  Location[facility];  Service: Open Heart Surgery;  Laterality: N/A;  Coronary Artery Bypass Graft on Pump times two utlizing left internal mammary artery and right greater saphenous vein harvested endoscopically    DIAGNOSTIC MAMMOGRAM  [facility]   LEFT HEART CATHETERIZATION WITH CORONARY ANGIOGRAM N/A 05/24/2011   Procedure: LEFT HEART CATHETERIZATION WITH CORONARY ANGIOGRAM;  Surgeon: Marykay Lex, MD;  Location: [facility] CATH LAB;  Service: Cardiovascular;  Laterality: N/A;   POLYPECTOMY  03/19/2023   Procedure: POLYPECTOMY;  Surgeon: Franky Macho, MD;  [facility];  Service: Endoscopy;;   SUBMUCOSAL LIFTING INJECTION  03/19/2023   Procedure: SUBMUCOSAL LIFTING INJECTION;  Surgeon: Franky Macho, MD;  [facility];  Service: Endoscopy;;   SUBMUCOSAL TATTOO INJECTION  03/19/2023   Procedure: SUBMUCOSAL TATTOO INJECTION;  Surgeon: Franky Macho, MD;  [facility];  Service: Endoscopy;;   TONSILLECTOMY  1962   TUBAL LIGATION      Allergies  Allergen Reactions   Trimethoprim Shortness Of Breath   Ampicillin     Patient states she gets flushed   Crestor [Rosuvastatin]     myalgias   Eplerenone     fatigued   Hydrochlorothiazide Nausea And Vomiting    dizzy   Levaquin [Levofloxacin]    Amlodipine Swelling   Cetirizine & Related Other (See Comments)    Extreme Drowsiness - "knocks me loopy for 2 days"   Latex Rash   Levofloxacin Nausea And Vomiting   Lisinopril Other (See Comments)    Excessive mucus production, swelling   Prednisone Other (See Comments)    High dose - hallucinations   Tape Rash    Current Outpatient  Medications  Medication Sig Dispense Refill   aspirin (ASPIRIN LOW DOSE) 81 MG chewable tablet      atorvastatin (LIPITOR) 40 MG  tablet Take 1 tablet (40 mg total) by mouth daily. 90 tablet 3   diclofenac Sodium (VOLTAREN) 1 % GEL Apply 2 g topically as needed.     losartan (COZAAR) 50 MG tablet Take 1 tablet (50 mg total) by mouth daily. 90 tablet 3   pantoprazole (PROTONIX) 40 MG tablet Take 1 tablet (40 mg total) by mouth daily. 90 tablet 1   pregabalin (LYRICA) 25 MG capsule Take 25-75 mg by mouth at bedtime.     traMADol (ULTRAM) 50 MG tablet Take 25 mg by mouth as needed.     Vitamin D-Vitamin K (VITAMIN K2-VITAMIN D3 PO) Take by mouth.     spironolactone (ALDACTONE) 25 MG tablet Take 1 tablet (25 mg total) by mouth daily. 90 tablet 3   No current facility-administered medications for this visit.    Social History   Socioeconomic History   Marital status: Divorced    Spouse name: single   Number of children: 1   Years of education: Not on file   Highest education level: Not on file  Occupational History   Occupation: Retired   Tobacco Use   Smoking status: Former    Current packs/day: 0.00    Average packs/day: 0.5 packs/day for 15.0 years (7.5 ttl pk-yrs)    Types: Cigarettes    Start date: 05/23/1991    Quit date: 05/23/2006    Years since quitting: 16.9   Smokeless tobacco: Never  Vaping Use   Vaping status: Never Used  Substance and Sexual Activity   Alcohol use: Yes    Alcohol/week: 0.0 standard drinks of alcohol    Comment: rare glass of wine   Drug use: No   Sexual activity: Not Currently    Birth control/protection: Post-menopausal  Other Topics Concern   Not on file  Social History Narrative   Diet:  Fish and veggies - does not eat a lot of red meat   Do you drink/eat things with caffeine?  2 cups a day   Marital status:  Divorced   Do you live in a house, apartment, assisted living, condo, trailer, etc.)? Renting a one-story house   Is it one or more  stories?  One   How many persons live in your home?  Lives alone   Do you have any pets in your home? (please list)  One dog   Education:  Some college   Current or past profession:  CNA   Do you exercise:  Yes - Walks 2-3 times a week.      ADVANCED DIRECTIVES  None.  Does want CPR.       FUNCTIONAL STATUS   Has no difficulties with ADLs.   Social Drivers of Corporate investment banker Strain: Not on file  Food Insecurity: Not on file  Transportation Needs: Not on file  Physical Activity: Not on file  Stress: Not on file  Social Connections: Not on file  Intimate Partner Violence: Not on file    Family History  Problem Relation Age of Onset   Mental illness Mother    Alcohol abuse Mother    Drug abuse Sister    Cancer Brother        head and neck cancer   Alcohol abuse Sister    Pancreatitis Sister    Hypertension Daughter    Fibroids Daughter    Lung cancer Maternal Grandmother    Heart disease Maternal Grandfather    Colon cancer Neg Hx    Breast cancer Neg  Hx    Esophageal cancer Neg Hx    Rectal cancer Neg Hx    Stomach cancer Neg Hx     ROS General: Negative; No fevers, chills, or night sweats HEENT: Negative; No changes in vision or hearing, sinus congestion, difficulty swallowing Pulmonary: Negative; No cough, wheezing, shortness of breath, hemoptysis Cardiovascular: See HPI: No chest pain, presyncope, syncope, palpatations GI: Positive for GERD, evaluated by Dr. Marina Goodell;  No nausea, vomiting, diarrhea, or abdominal pain GU: Positive for cyst on kidney; No dysuria, hematuria, or difficulty voiding Musculoskeletal: Positive for occasional muscle spasm; left shoulder discomfort Hematologic: Negative; no easy bruising, bleeding Endocrine: Negative; no heat/cold intolerance; no diabetes, Neuro: Negative; no changes in balance, headaches Skin: Negative; No rashes or skin lesions Psychiatric: Negative; No behavioral problems, depression Sleep: Positive for mild  sleep apnea, snoring,  negative; no bruxism, restless legs, hypnogognic hallucinations. Other comprehensive 14 point system review is negative   Physical Exam BP 128/84   Pulse (!) 57   Ht 5\' 4"  (1.626 m)   Wt 170 lb (77.1 kg)   SpO2 97%   BMI 29.18 kg/m    Repeat blood pressure by me was 128/78  Wt Readings from Last 3 Encounters:  05/10/23 170 lb (77.1 kg)  04/24/23 160 lb (72.6 kg)  03/19/23 165 lb (74.8 kg)   General: Alert, oriented, no distress.  Skin: normal turgor, no rashes, warm and dry HEENT: Normocephalic, atraumatic. Pupils equal round and reactive to light; sclera anicteric; extraocular muscles intact;  Nose without nasal septal hypertrophy Mouth/Parynx benign; Mallinpatti scale 3 Neck: No JVD, no carotid bruits; normal carotid upstroke Lungs: clear to ausculatation and percussion; no wheezing or rales Chest wall: without tenderness to palpitation Heart: PMI not displaced, RRR, s1 s2 normal, 1/6 systolic murmur, no diastolic murmur, no rubs, gallops, thrills, or heaves Abdomen: soft, nontender; no hepatosplenomehaly, BS+; abdominal aorta nontender and not dilated by palpation. Back: no CVA tenderness Pulses 2+ Musculoskeletal: full range of motion, normal strength, no joint deformities Extremities: no clubbing cyanosis or edema, Homan's sign negative  Neurologic: grossly nonfocal; Cranial nerves grossly wnl Psychologic: Normal mood and affect    EKG Interpretation Date/Time:  Thursday May 10 2023 11:41:48 EDT Ventricular Rate:  57 PR Interval:  140 QRS Duration:  82 QT Interval:  416 QTC Calculation: 404 R Axis:   84  Text Interpretation: Sinus bradycardia Nonspecific T wave abnormality When compared with ECG of 20-Dec-2022 10:24, No significant change was found Confirmed by Nicki Guadalajara ([id number]) on 05/10/2023 12:23:16 PM    December 20, 2022 ECG (independently read by me): NSR at 61; T wave abnormality  March 21, 2022 ECG (independently read by me):  Sinus bradycardia at 57, isolalated artifact  December 22, 2020 ECG (independently read by me): Sinus bradycardia at 56, nonspecific STT changes  November 25, 2019 ECG (independently read by me): NSR at 63; nonspecific ST changes    July 21, 2019 ECG (independently read by me): Sinus bradycardia at 55; nondiagnostic T wave abnormality in V1-2  04/09/2019 ECG (independently read by me): Sinus Bradycardia at 57; normal intervals  October 2018 ECG (independently read by me): Sinus bradycardia at 58 bpm.  Nonspecific ST-T changes.  QTc interval 394 [name].  PR interval 140 [name].  October 2016 ECG (independently read by me): Sinus bradycardia 53 bpm.  No ectopy.  Normal intervals.  August 2015 ECG (independently read by me): Sinus bradycardia at 47 beats per minute.  Nonspecific T changes.  QTc interval  385 [name]  LABS:    Latest Ref Rng & Units 03/16/2023   10:00 AM 02/12/2023    9:38 AM 01/05/2023    4:57 PM  BMP  Glucose 70 - 99 mg/dL 161  99  096   BUN 8 - 23 mg/dL 19  15  21    Creatinine 0.44 - 1.00 mg/dL 0.45  4.09  8.11   Sodium 135 - 145 mmol/L 135  136  135   Potassium 3.5 - 5.1 mmol/L 4.0  3.6  3.3   Chloride 98 - 111 mmol/L 101  104  103   CO2 22 - 32 mmol/L 24  23  24    Calcium 8.9 - 10.3 mg/dL 9.3  9.1  8.9       Latest Ref Rng & Units 01/05/2023    4:57 PM 12/21/2022   10:17 AM 06/04/2022   12:23 PM  Hepatic Function  Total Protein 6.5 - 8.1 g/dL 6.8  6.6  6.8   Albumin 3.5 - 5.0 g/dL 3.9  3.9  3.9   AST 15 - 41 U/L 15  14  16    ALT 0 - 44 U/L 17  16  15    Alk Phosphatase 38 - 126 U/L 82  82  76   Total Bilirubin <1.2 mg/dL 0.6  0.6  0.5       Latest Ref Rng & Units 04/24/2023   11:37 AM 01/05/2023    4:57 PM 12/21/2022    1:50 PM  CBC  WBC 4.0 - 10.5 K/uL 7.5  10.1  10.1   Hemoglobin 12.0 - 15.0 g/dL 91.4  78.2  95.6   Hematocrit 36.0 - 46.0 % 42.0  45.1  45.4   Platelets 150 - 400 K/uL 392  444  431    Lab Results  Component Value Date   MCV 84.7 04/24/2023   MCV  86.4 01/05/2023   MCV 85.5 12/21/2022   Lab Results  Component Value Date   TSH 1.030 05/25/2021   Lab Results  Component Value Date   HGBA1C 5.7 (H) 05/25/2021   Lipid Panel     Component Value Date/Time   CHOL 145 12/21/2022 1017   CHOL 120 05/25/2021 1111   TRIG 74 12/21/2022 1017   HDL 59 12/21/2022 1017   HDL 50 05/25/2021 1111   CHOLHDL 2.5 12/21/2022 1017   VLDL 15 12/21/2022 1017   LDLCALC 71 12/21/2022 1017   LDLCALC 56 05/25/2021 1111   RADIOLOGY: No results found.  IMPRESSION:  1. Essential hypertension   2. S/P CABG x 2   3. Coronary artery disease involving native heart without angina pectoris, unspecified vessel or lesion type   4. Hyperlipidemia LDL goal <55   5. Renal cyst   6. Aortic atherosclerosis (HCC)   7. Chronic bilateral thoracic back pain     ASSESSMENT AND PLAN: [name]. Alaena Strader is a very pleasant 73 year old female who underwent CABG revascularization surgery in April 2013 after she was found to have high-grade left main stenosis.  A  2014 nuclear study showed normal perfusion and hyperdynamic LV function.  She is not having any anginal symptomatology.  She has had numerous intolerances to medications and reportedly remotely may not have tolerated losartan, hydralazine, amlodipine, lisinopril.  She was subsequently started on spironolactone.  Ever, retrospectively, she now feels that she tolerated the losartan well in the increased mucus secretion in her throat most likely was not related to that.  When I saw her in June 2021,  she had complaints of lower extremity discomfort.  A venous Doppler study was negative for DVT.  Her echo Doppler study from November 30, 2020 revealed normal LV function with EF at 60 to 65% with grade 1 diastolic dysfunction.  She has experienced some episodes of vague chest pressure not typically exertionally precipitated.  She has continued issues with daytime fatigability and sleeps with her head propped up.  Laboratory in  May 2022 had shown an LDL cholesterol at 83 with total cholesterol 157 and triglycerides 85.  With her known CAD I have recommended further titration of atorvastatin from 20 mg to 40 mg daily with target LDL less than 70.  Due to vague episodes of chest pain she underwent a Lexiscan Myoview study on December 28, 2020 which showed normal perfusion without scar or ischemia.  She tells me her primary physician recently reinstated losartan and she has been taking 50 mg and tolerating this well.  She also is on spironolactone at 25 mg daily and is in need for future refills.   Most recently she is now taking spironolactone 50 mg.  I reviewed her recent MRI and CT images evaluation with Dr. Bjorn Pippin.  He has a left renal cyst measuring 7.7 x 7.6 cm with simple fluid signal.  Her imaging studies have also demonstrated normal aorta size without dilatation but evidence for mild calcification.  Apparently, losartan had been discontinued but when last seen by me I recommended she reinstitute losartan at 25 mg and recommended reduction of spironolactone back to 25 mg.  She is not having any anginal symptoms.  Her blood pressure today is stable and on repeat by me 128/78.  She underwent successful aspiration of her kidney cyst by Dr. Ronne Binning.  Her EKG today remains stable with sinus bradycardia at 57 bpm and nonspecific T wave abnormality.  Lipid studies in November 2024 were stable with total cholesterol 145 LDL cholesterol 71 LP(a) 23.  I discussed with her my plans for upcoming retirement in several months.  I will transition her to the interventional care of Dr. Bryan Lemma and we will tentatively schedule follow-up in November 2025.   Lennette Bihari, MD, [facility]  05/14/2023 9:24 AM

## 2023-05-10 NOTE — Telephone Encounter (Signed)
 Patient identification verified by 2 forms. Shade Flood, RN     Patient BP reviewed during office visit today. See visit note

## 2023-05-10 NOTE — Patient Instructions (Signed)
 Medication Instructions:  No medication changes were made during today's visit.  *If you need a refill on your cardiac medications before your next appointment, please call your pharmacy*   Lab Work: No labs were ordered during today's visit.  If you have labs (blood work) drawn today and your tests are completely normal, you will receive your results only by: MyChart Message (if you have MyChart) OR A paper copy in the mail If you have any lab test that is abnormal or we need to change your treatment, we will call you to review the results.   Testing/Procedures: No procedures were ordered during today's visit.    Follow-Up: At [facility], you and your health needs are our priority.  As part of our continuing mission to provide you with exceptional heart care, we have created designated Provider Care Teams.  These Care Teams include your primary Cardiologist (physician) and Advanced Practice Providers (APPs -  Physician Assistants and Nurse Practitioners) who all work together to provide you with the care you need, when you need it.  We recommend signing up for the patient portal called "MyChart".  Sign up information is provided on this After Visit Summary.  MyChart is used to connect with patients for Virtual Visits (Telemedicine).  Patients are able to view lab/test results, encounter notes, upcoming appointments, etc.  Non-urgent messages can be sent to your provider as well.   To learn more about what you can do with MyChart, go to [email or web address].    Your next appointment:   8 month(s)  Provider:   Dr. Herbie Baltimore     Other Instructions [facility]  [address] [place], [place] [address] OPENING APRIL [phone]       1st Floor: - Lobby - Registration  - Pharmacy  - Lab - Cafe   2nd Floor: - PV Lab - Diagnostic Testing (echo, CT, nuclear med)   3rd Floor: - Vacant   4th Floor: - TCTS (cardiothoracic surgery) - AFib  Clinic - Structural Heart Clinic - Vascular Surgery  - Vascular Ultrasound   5th Floor: - HeartCare Cardiology (general and EP) - Clinical Pharmacy for coumadin, hypertension, lipid, weight-loss medications, and med management appointments      Valet parking services will be available as well.       Thank you for choosing [facility]!

## 2023-05-14 ENCOUNTER — Encounter: Payer: Self-pay | Admitting: Cardiovascular Disease

## 2023-05-24 ENCOUNTER — Ambulatory Visit (HOSPITAL_COMMUNITY)
Admission: RE | Admit: 2023-05-24 | Discharge: 2023-05-24 | Disposition: A | Discharge status: Home / Self Care | Payer: Medicare Other | Source: Ambulatory Visit | Attending: Urology | Admitting: Urology

## 2023-05-24 DIAGNOSIS — N281 Cyst of kidney, acquired: Secondary | ICD-10-CM | POA: Diagnosis not present

## 2023-05-29 DIAGNOSIS — E7849 Other hyperlipidemia: Secondary | ICD-10-CM | POA: Diagnosis not present

## 2023-05-29 DIAGNOSIS — I2510 Atherosclerotic heart disease of native coronary artery without angina pectoris: Secondary | ICD-10-CM | POA: Diagnosis not present

## 2023-05-29 DIAGNOSIS — I10 Essential (primary) hypertension: Secondary | ICD-10-CM | POA: Diagnosis not present

## 2023-05-29 DIAGNOSIS — E782 Mixed hyperlipidemia: Secondary | ICD-10-CM | POA: Diagnosis not present

## 2023-05-29 DIAGNOSIS — R001 Bradycardia, unspecified: Secondary | ICD-10-CM | POA: Diagnosis not present

## 2023-06-01 ENCOUNTER — Ambulatory Visit: Payer: Medicare Other | Admitting: Urology

## 2023-06-06 ENCOUNTER — Ambulatory Visit: Payer: Medicare Other | Admitting: Urology

## 2023-06-06 VITALS — BP 139/73 | HR 52

## 2023-06-06 DIAGNOSIS — N281 Cyst of kidney, acquired: Secondary | ICD-10-CM

## 2023-06-06 LAB — URINALYSIS, ROUTINE W REFLEX MICROSCOPIC
Bilirubin, UA: NEGATIVE
Glucose, UA: NEGATIVE
Ketones, UA: NEGATIVE
Nitrite, UA: NEGATIVE
Protein,UA: NEGATIVE
RBC, UA: NEGATIVE
Specific Gravity, UA: 1.01 (ref 1.005–1.030)
Urobilinogen, Ur: 0.2 mg/dL (ref 0.2–1.0)
pH, UA: 7 (ref 5.0–7.5)

## 2023-06-06 LAB — MICROSCOPIC EXAMINATION: RBC, Urine: NONE SEEN /HPF (ref 0–2)
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
## 2023-06-09 DIAGNOSIS — R197 Diarrhea, unspecified: Secondary | ICD-10-CM | POA: Diagnosis not present

## 2023-06-09 DIAGNOSIS — L309 Dermatitis, unspecified: Secondary | ICD-10-CM | POA: Diagnosis not present

## 2023-06-11 LAB — URINE CULTURE

## 2023-06-12 ENCOUNTER — Telehealth: Payer: Self-pay | Admitting: Urology

## 2023-06-12 ENCOUNTER — Encounter: Payer: Self-pay | Admitting: Urology

## 2023-06-12 DIAGNOSIS — N390 Urinary tract infection, site not specified: Secondary | ICD-10-CM

## 2023-06-12 MED ORDER — NITROFURANTOIN MONOHYD MACRO 100 MG PO CAPS: 100.0000 mg | ORAL_CAPSULE | Freq: Two times a day (BID) | ORAL | 0 refills | Status: DC

## 2023-06-12 NOTE — Telephone Encounter (Signed)
 Patient is made aware that Macrobid  100 mg will treat infection well per Dr. Claretta Croft. Patient voiced understanding.

## 2023-06-12 NOTE — Telephone Encounter (Signed)
 Patient called back its been an hour since she called the first time , she wants to know if Dr Claretta Croft is going to call her in some medication or should she go to Urgent care or ER?

## 2023-06-12 NOTE — Patient Instructions (Signed)
 A Growth in the Kidney (Renal Mass): What to Know  A renal mass is an abnormal growth in the kidney. It may be found during an MRI, CT scan, or ultrasound that's done to check for other problems in the belly. Some renal masses are cancerous, or malignant, and can grow or spread quickly. Others are benign, which means they're not cancer. Renal masses include: Tumors. These may be either cancerous or benign. The most common cancerous tumor in adults is called renal cell carcinoma. In children, the most common type is Wilms tumor. The most common kidney tumors that aren't cancer include renal adenomas, oncocytomas, and angiomyolipomas (AML). Cysts. These are pockets of fluid that form on or in the kidney. What are the causes? Certain types of cancers, infections, or injuries can cause a renal mass. It's not always known what causes a cyst to form in or on the kidney. What are the signs or symptoms? Often, a renal mass or kidney cyst doesn't cause any signs or symptoms. How is this diagnosed? Your health care provider may suggest tests to diagnose the cause of your renal mass. These tests may include: A physical exam. Blood tests. Pee (urine) tests. Imaging tests, such as: CT scan. MRI. Ultrasound. Chest X-ray or bone scan. These may be done if a tumor is cancerous to see if the cancer has spread outside the kidney. Biopsy. This is when a small piece of tissue is removed from the renal mass for testing. How is this treated? Treatment is not always needed for a renal mass. Treatment will depend on the cause of the mass and if it's causing any problems or symptoms. For a cancerous renal mass, treatment options may include: Surgery. This is done to remove the tumor and any affected tissue. Chemotherapy. This uses medicines to kill cancer cells. Radiation. High-energy X-rays or gamma rays are used to kill cancer cells. Ablation. This uses extreme hot or cold temperature to kill the cancer  cells. Immunotherapy. Medicines are used to help the body's defense system (immune system) fight the cancer cells. Taking part in clinical trials. This involves trying new or experimental treatments to see if they're effective. Most kidney cysts don't need to be treated. Follow these instructions at home: What you need to do at home will depend on the cause of the mass. Follow the instructions that your provider gives you. In general: Take medicines only as told. If you were given antibiotics, take them as told. Do not stop taking them even if you start to feel better. Follow any instructions from your provider about what you can and can't do. Do not smoke, vape, or use nicotine or tobacco. Keep all follow-up visits. Your provider will need to check if your renal mass has changed or grown. Contact a health care provider if: You have flank pain, which is pain in your side or back. You have a fever. You have a loss of appetite. You have pain or swelling in your belly. You lose weight. Get help right away if: Your pain gets worse. There's blood in your pee. You can't pee. This information is not intended to replace advice given to you by your health care provider. Make sure you discuss any questions you have with your health care provider. Document Revised: 07/28/2022 Document Reviewed: 07/28/2022 Elsevier Patient Education  2024 [employer].

## 2023-06-12 NOTE — Telephone Encounter (Signed)
 Patient got results of culture and wants to know what she needs to do. She would like a call from the nurse. States she has a lot of issues with medication.

## 2023-06-19 DIAGNOSIS — Z0000 Encounter for general adult medical examination without abnormal findings: Secondary | ICD-10-CM | POA: Diagnosis not present

## 2023-06-19 DIAGNOSIS — Z1329 Encounter for screening for other suspected endocrine disorder: Secondary | ICD-10-CM | POA: Diagnosis not present

## 2023-06-19 DIAGNOSIS — Z131 Encounter for screening for diabetes mellitus: Secondary | ICD-10-CM | POA: Diagnosis not present

## 2023-06-19 DIAGNOSIS — Z13220 Encounter for screening for lipoid disorders: Secondary | ICD-10-CM | POA: Diagnosis not present

## 2023-06-20 DIAGNOSIS — L309 Dermatitis, unspecified: Secondary | ICD-10-CM | POA: Diagnosis not present

## 2023-06-21 ENCOUNTER — Telehealth: Payer: Self-pay | Admitting: Urology

## 2023-06-21 DIAGNOSIS — N390 Urinary tract infection, site not specified: Secondary | ICD-10-CM

## 2023-06-21 NOTE — Telephone Encounter (Signed)
 Just finished Macrobid  and wants to know if McKenzie should put her on a preventative antibiotic

## 2023-06-22 ENCOUNTER — Inpatient Hospital Stay: Payer: Medicare Other

## 2023-06-22 ENCOUNTER — Ambulatory Visit: Admitting: Gastroenterology

## 2023-06-22 MED ORDER — NITROFURANTOIN MACROCRYSTAL 50 MG PO CAPS: 50.0000 mg | ORAL_CAPSULE | Freq: Every day | ORAL | 11 refills | Status: DC

## 2023-06-22 NOTE — Telephone Encounter (Signed)
 Return pt phone call to notify her that per MD McKenzie he wants her on a daily antibiotic pt confirmed pharmacy and voiced her understanding

## 2023-06-28 ENCOUNTER — Inpatient Hospital Stay: Payer: Medicare Other | Admitting: Oncology

## 2023-06-29 ENCOUNTER — Inpatient Hospital Stay: Payer: Medicare Other | Admitting: Oncology

## 2023-07-06 ENCOUNTER — Telehealth: Payer: Self-pay | Admitting: Urology

## 2023-07-06 DIAGNOSIS — N390 Urinary tract infection, site not specified: Secondary | ICD-10-CM

## 2023-07-06 MED ORDER — HIPREX 1 G PO TABS: ORAL_TABLET | ORAL | 3 refills | Status: DC

## 2023-07-06 NOTE — Telephone Encounter (Signed)
 Return called to patient and she state's she is experiencing diarrhea after taking Macrodantin  for about two weeks. Consulted with Dr. Claretta Croft for patient start taking Hiprex 1 g tablet at bedtime.  Patient was made aware via detailed voiced message.

## 2023-07-06 NOTE — Telephone Encounter (Signed)
 Patient had to stop taking nitrofurantoin . She said it upset her stomach. She would like a call on what she can take or what she needs to do.

## 2023-07-17 DIAGNOSIS — E785 Hyperlipidemia, unspecified: Secondary | ICD-10-CM | POA: Diagnosis not present

## 2023-07-17 DIAGNOSIS — I2510 Atherosclerotic heart disease of native coronary artery without angina pectoris: Secondary | ICD-10-CM | POA: Diagnosis not present

## 2023-07-17 DIAGNOSIS — G4700 Insomnia, unspecified: Secondary | ICD-10-CM | POA: Diagnosis not present

## 2023-07-17 DIAGNOSIS — I10 Essential (primary) hypertension: Secondary | ICD-10-CM | POA: Diagnosis not present

## 2023-07-17 NOTE — Progress Notes (Signed)
 [facility] at [facility] Patient Visit  [address]  Phone: [phone] [place], [place] [address]  Fax: [phone]  PCP: Trudy Vaughn Lerner, MD Date of Service: 07/17/2023  Subjective:  Patient ID: [name] [name] is an 73 y.o. female patient with CAD s/p CABG (LIMA to LAD, SVG to OM2), HTN, HLD, prior tobacco use, prediabetes, GERD, anxiety and depression, insomnia, here to establish care for CAD, HTN, HLD. History of Present Illness She was previously under the care of Dr. Burnard at [facility], who is retiring.  Since her CABG in 2013, she has generally done well from a CV perspective. No PCI or heart catheterizations since then. She experiences heart palpitations once or twice a week, particularly during nights of poor sleep. These episodes are described as a strong heartbeat but do not last for hours. There is no syncope or significant changes in heart rate during these episodes. She associates the palpitations with insomnia and medication use.  Her primary issue is insomnia, which has been ongoing for several years. Insomnia has worsened recently, with difficulty falling asleep until 1 or 2 AM. She sometimes takes Tylenol  PM and pregabalin to help with sleep. If she takes a higher dose of pregabalin, she is drowsy the next day. She has not tried CPAP therapy due to an extreme overbite. She consumes 2 cups of coffee in the AM, and drinks tea in the afternoon. She is very worried that    Objective:  Vital Signs BP 125/64 (BP Site: L Arm, BP Position: Sitting, BP Cuff Size: Medium)   Pulse 60   Temp 36.5 C (97.7 F) (Temporal)   Resp 16   Ht 162.6 cm (5' 4)   Wt 75.8 kg (167 lb)   SpO2 95%   BMI 28.67 kg/m  Physical Exam GENERAL: Alert, well developed, no acute distress. CHEST: Clear to auscultation bilaterally. CARDIOVASCULAR: Regular rate and rhythm, no murmurs, rubs, or gallops. JVP is flat. ABDOMEN: Soft, non-tender. EXTREMITIES: No edema.  Results LABS BMP:  Na 135, K 4.0, Cr 0.77 (03/16/2023) Lipid panel: Total cholesterol 145, HDL 59, LDL 71, Triglycerides 74 (88/92/7975) Lipoprotein A: 23 (12/21/2022)  DIAGNOSTIC EKG: Normal sinus rhythm (07/17/2023)  The ASCVD Risk score (Arnett DK, et al., 2019) failed to calculate.  Assessment/Plan:  CAD s/p CABG HTN HLD Overall doing well. BP and lipids at goal. Continue medical management as below. Reasonable to stop ARB for now to see if this helps with insomnia. -ASA 81 mg daily -atorvastatin  40 mg daily -stop losartan  -spironolactone  25 mg daily  Insomnia We discussed this issue at length, as this is the issue that is most affecting her general health. She describes having trouble initiating sleep, with her mind racing. Do not think this is related to OSA and/or retroagnathia. She has tried trazodone and higher-dose pregabalin without much help. Think there may be a significant component of anxiety and prior traumatic experiences that may be contributing. Will have her try hydroxyzine for a few weeks to see if this helps; if not, could consider referral to Psychiatry for further evaluation. -stop ARB as above -trial of hydroxyzine 25 mg qhsprn  Return to clinic: Return in about 3 months (around 10/17/2023).  I personally spent 55 minutes face-to-face and non-face-to-face in the care of this patient, which includes all pre, intra, and post visit time on the date of service.  Franky FELIX Gil, MD, [facility], [facility] Assistant Professor of Medicine Division of Cardiology [facility]

## 2023-07-20 DIAGNOSIS — R300 Dysuria: Secondary | ICD-10-CM | POA: Diagnosis not present

## 2023-07-20 DIAGNOSIS — J302 Other seasonal allergic rhinitis: Secondary | ICD-10-CM | POA: Diagnosis not present

## 2023-07-20 DIAGNOSIS — J209 Acute bronchitis, unspecified: Secondary | ICD-10-CM | POA: Diagnosis not present

## 2023-07-20 DIAGNOSIS — R062 Wheezing: Secondary | ICD-10-CM | POA: Diagnosis not present

## 2023-07-26 DIAGNOSIS — Z881 Allergy status to other antibiotic agents status: Secondary | ICD-10-CM | POA: Diagnosis not present

## 2023-07-26 DIAGNOSIS — R6889 Other general symptoms and signs: Secondary | ICD-10-CM | POA: Diagnosis not present

## 2023-07-26 DIAGNOSIS — I10 Essential (primary) hypertension: Secondary | ICD-10-CM | POA: Diagnosis not present

## 2023-07-26 DIAGNOSIS — Z888 Allergy status to other drugs, medicaments and biological substances status: Secondary | ICD-10-CM | POA: Diagnosis not present

## 2023-07-26 DIAGNOSIS — Z7982 Long term (current) use of aspirin: Secondary | ICD-10-CM | POA: Diagnosis not present

## 2023-07-26 DIAGNOSIS — I252 Old myocardial infarction: Secondary | ICD-10-CM | POA: Diagnosis not present

## 2023-07-26 DIAGNOSIS — Z91048 Other nonmedicinal substance allergy status: Secondary | ICD-10-CM | POA: Diagnosis not present

## 2023-07-26 DIAGNOSIS — Z79899 Other long term (current) drug therapy: Secondary | ICD-10-CM | POA: Diagnosis not present

## 2023-07-26 DIAGNOSIS — Z91040 Latex allergy status: Secondary | ICD-10-CM | POA: Diagnosis not present

## 2023-07-26 DIAGNOSIS — Z87891 Personal history of nicotine dependence: Secondary | ICD-10-CM | POA: Diagnosis not present

## 2023-07-26 NOTE — ED Provider Notes (Signed)
 [facility]  Emergency Department Provider Note     History   Chief Complaint Hypertension   HPI  [name] [name] is a 73 y.o. female presents with elevated blood pressure.  Patient has multiple allergies to different things including blood pressure medications.  She has been recently taken off of losartan  because she told the cardiologist that she had some shortness of breath with it.  She also states that she gets some swelling with hydralazine  and that is why she is not on that either.  She does not seem to tell me anything that tells me that she has actual allergic reaction such as hives or wheezing swellings etc.  Patient was told the risks and benefits of the not taking the medications for blood pressure since we are very narrowed in terms of the choice and we cannot give her some medications because her her heart rate is low.  Cannot give her beta-blockers or alpha blockers.  Cannot give her calcium  channel blockers.    Past Medical History[1]  Past Surgical History[2]  Prior to Admission medications  Medication Dose, Route, Frequency  aspirin  (ECOTRIN) 81 MG tablet 81 mg, Daily (standard)  atorvastatin  (LIPITOR) 40 MG tablet 40 mg, Daily (standard)  carvedilol  (COREG ) 3.125 MG tablet 3.125 mg, Oral, 2 times a day (standard)  cholecalciferol, vitamin D3, 2,000 unit tablet 2,000 Units  conjugated estrogens  (PREMARIN ) 0.625 mg/gram vaginal cream 0.5 Applicatorfuls  hydrALAZINE  (APRESOLINE ) 25 MG tablet 25 mg, Oral, 3 times a day (standard)  hydrOXYzine (ATARAX) 25 MG tablet 25 mg, Oral, Nightly PRN  methenamine (HIPREX ) 1 gram tablet 1 g, 2 times a day with meals  multivit-min36/iron /folic acid (GERITOL COMPLETE ORAL) Take by mouth.  pantoprazole  (PROTONIX ) 40 MG tablet 40 mg, Daily (standard)  pregabalin (LYRICA) 25 MG capsule 25 mg, 2 times a day (standard)  spironolactone  (ALDACTONE ) 25 MG tablet 25 mg    Allergies Amlodipine, Hydralazine  analogues,  Losartan  potassium, Atorvastatin , Doxazosin , Adhesive, Latex, Levofloxacin , Lisinopril , and Prednisone    Short Social History[3]  Review of Systems  Neurological:  Positive for dizziness.  All other systems reviewed and are negative.   As in HPI, all systems reviewed and otherwise negative.  Physical Exam    Vitals:   07/26/23 1810  BP: 195/75  Pulse: 55  Resp: 14  Temp: 36.6 C (97.8 F)  TempSrc: Temporal  SpO2: 96%  Weight: 75.7 kg (166 lb 14.4 oz)  Height: 162.6 cm (5' 4)     Physical Exam  Constitutional: Patient appears well-developed and well nourished. Non toxic in appearance. HEENT: Unremarkable. Head: Atraumatic.  Eyes: Normal ocular movements. Neck: Supple with normal range of motion.  Pulmonary/Chest: Effort normal. No respiratory distress. Abdominal: Soft and non tender abdomen. Musculoskeletal: Extremities atraumatic. Neurological: Alert with no focal neurological deficit. Ambulatory with a steady gait. Skin: Warm and dry.  Nursing note and vital signs reviewed.   ED Course        Procedures  Medications ordered during this encounter  Medications  . losartan  (COZAAR ) tablet 50 mg  . furosemide  (LASIX ) tablet 40 mg    ED Results No results found for any visits on 07/26/23.  Radiology No results found.   Medical Decision Making   I have reviewed the vital signs and the nursing notes. Labs and radiology results that were available during my care of the patient were independently reviewed by me and considered in my medical decision making.    This is a well-appearing 73 year old female with elevated  blood pressure who does not have any weakness of the arms or legs or any slurred speech.  Needs to be put on other blood pressure medications in addition to what she is taking based on her low heart rate.  Medical Decision Making    Differential Diagnosis: Elevated blood pressure, essential hypertension, controlled  hypertension      ED Clinical Impression   Final diagnoses:  None   Uncontrolled hypertension  Procedures      This record has been created using [employer]. Chart creation errors have been sought, but may not always have been located. Such creation errors do not reflect on the standard of medical care.       [1] Past Medical History: Diagnosis Date  . Anxiety   . Hypertension   . Myocardial infarction      [2] No past surgical history on file. [3] Social History Tobacco Use  . Smoking status: Former    Types: Cigarettes    Passive exposure: Past  . Smokeless tobacco: Never  Vaping Use  . Vaping status: Never Used  Substance Use Topics  . Alcohol  use: Yes  . Drug use: Never   Maree Jonelle Lash, MD 07/26/23 [phone]

## 2023-07-31 DIAGNOSIS — R300 Dysuria: Secondary | ICD-10-CM | POA: Diagnosis not present

## 2023-07-31 DIAGNOSIS — N390 Urinary tract infection, site not specified: Secondary | ICD-10-CM | POA: Diagnosis not present

## 2023-07-31 DIAGNOSIS — R600 Localized edema: Secondary | ICD-10-CM | POA: Diagnosis not present

## 2023-07-31 DIAGNOSIS — I10 Essential (primary) hypertension: Secondary | ICD-10-CM | POA: Diagnosis not present

## 2023-08-14 DIAGNOSIS — I10 Essential (primary) hypertension: Secondary | ICD-10-CM | POA: Diagnosis not present

## 2023-08-24 DIAGNOSIS — R21 Rash and other nonspecific skin eruption: Secondary | ICD-10-CM | POA: Diagnosis not present

## 2023-08-28 ENCOUNTER — Telehealth: Payer: Self-pay | Admitting: Student in an Organized Health Care Education/Training Program

## 2023-08-28 NOTE — Telephone Encounter (Signed)
 Patient called to discuss blood pressure.  She states that she checked her blood pressure this evening at approximately 6 PM and noted that the systolic was in the 150s with a normal diastolic.  She checked her blood pressure 2 subsequent times within the hour with the highest systolic blood pressure being in the 160s.  She has taken all of her medication today.  She further endorses feeling some dizziness, headache and confusion that she initially stated started at 8 PM today, but then later revised her statement and said that has been going on and off for a while now.  She denies weakness, numbness, chest pain, shortness of breath.  I reassured the patient that her blood pressure is not critically elevated; however, I did instruct her to present to the ED for evaluation of her supposed acute dizziness, headache, and confusion.  She believes that her symptoms are due to dehydration and declined presenting to the ED.  She reported that she will call her PCP in the morning.

## 2023-08-29 ENCOUNTER — Encounter (HOSPITAL_BASED_OUTPATIENT_CLINIC_OR_DEPARTMENT_OTHER): Payer: Self-pay | Admitting: Cardiovascular Disease

## 2023-08-29 NOTE — Telephone Encounter (Signed)
 Pt calling back about this message and also spoke to on call Dr. Floretta last night in previous phone note. Please advise.

## 2023-08-29 NOTE — Telephone Encounter (Signed)
 Previous [facility] pt, pt has 6 mon f/u with Dr. Anner in December

## 2023-08-31 ENCOUNTER — Emergency Department (HOSPITAL_COMMUNITY)
Admission: EM | Admit: 2023-08-31 | Discharge: 2023-08-31 | Disposition: A | Discharge status: Home / Self Care | Attending: Emergency Medicine | Admitting: Emergency Medicine

## 2023-08-31 DIAGNOSIS — I10 Essential (primary) hypertension: Secondary | ICD-10-CM | POA: Diagnosis not present

## 2023-08-31 DIAGNOSIS — N390 Urinary tract infection, site not specified: Secondary | ICD-10-CM | POA: Diagnosis not present

## 2023-08-31 DIAGNOSIS — Z7982 Long term (current) use of aspirin: Secondary | ICD-10-CM | POA: Insufficient documentation

## 2023-08-31 DIAGNOSIS — Z91040 Latex allergy status: Secondary | ICD-10-CM | POA: Insufficient documentation

## 2023-08-31 DIAGNOSIS — R300 Dysuria: Secondary | ICD-10-CM | POA: Diagnosis present

## 2023-08-31 LAB — COMPREHENSIVE METABOLIC PANEL WITH GFR
ALT: 13 U/L (ref 0–44)
AST: 15 U/L (ref 15–41)
Albumin: 3.7 g/dL (ref 3.5–5.0)
Alkaline Phosphatase: 83 U/L (ref 38–126)
Anion gap: 8 (ref 5–15)
BUN: 19 mg/dL (ref 8–23)
CO2: 25 mmol/L (ref 22–32)
Calcium: 9.1 mg/dL (ref 8.9–10.3)
Chloride: 105 mmol/L (ref 98–111)
Creatinine, Ser: 0.79 mg/dL (ref 0.44–1.00)
GFR, Estimated: >60 mL/min (ref >60)
Glucose, Bld: 98 mg/dL (ref 70–99)
Potassium: 3.5 mmol/L (ref 3.5–5.1)
Sodium: 138 mmol/L (ref 135–145)
Total Bilirubin: 0.4 mg/dL (ref 0.0–1.2)
Total Protein: 6.6 g/dL (ref 6.5–8.1)

## 2023-08-31 LAB — CBC WITH DIFFERENTIAL/PLATELET
Abs Immature Granulocytes: 0.03 K/uL (ref 0.00–0.07)
Basophils Absolute: 0.1 K/uL (ref 0.0–0.1)
Basophils Relative: 1 %
Eosinophils Absolute: 0.3 K/uL (ref 0.0–0.5)
Eosinophils Relative: 3 %
HCT: 41.7 % (ref 36.0–46.0)
Hemoglobin: 13.6 g/dL (ref 12.0–15.0)
Immature Granulocytes: 0 %
Lymphocytes Relative: 33 %
Lymphs Abs: 3.3 K/uL (ref 0.7–4.0)
MCH: 28.3 pg (ref 26.0–34.0)
MCHC: 32.6 g/dL (ref 30.0–36.0)
MCV: 86.7 fL (ref 80.0–100.0)
Monocytes Absolute: 0.9 K/uL (ref 0.1–1.0)
Monocytes Relative: 9 %
Neutro Abs: 5.4 K/uL (ref 1.7–7.7)
Neutrophils Relative %: 54 %
Platelets: 422 K/uL — ABNORMAL HIGH (ref 150–400)
RBC: 4.81 MIL/uL (ref 3.87–5.11)
RDW: 13.6 % (ref 11.5–15.5)
WBC: 9.9 K/uL (ref 4.0–10.5)
nRBC: 0 % (ref 0.0–0.2)

## 2023-08-31 LAB — URINALYSIS, W/ REFLEX TO CULTURE (INFECTION SUSPECTED)
Bilirubin Urine: NEGATIVE
Glucose, UA: NEGATIVE mg/dL
Hgb urine dipstick: NEGATIVE
Ketones, ur: NEGATIVE mg/dL
Nitrite: NEGATIVE
Protein, ur: NEGATIVE mg/dL
Specific Gravity, Urine: 1.014 (ref 1.005–1.030)
WBC, UA: >50 WBC/hpf (ref 0–5)
pH: 5 (ref 5.0–8.0)

## 2023-08-31 MED ORDER — CEPHALEXIN 500 MG PO CAPS: 500.0000 mg | ORAL_CAPSULE | Freq: Two times a day (BID) | ORAL | 0 refills | Status: AC

## 2023-08-31 NOTE — Discharge Instructions (Addendum)
 If you develop fever, vomiting or any other new/concerning symptoms then return to the ER

## 2023-08-31 NOTE — ED Triage Notes (Signed)
 Pt comes in for s/s of UTI. Pt has known recurrent UTI. Pt also had left neck pain (where lymph nodes are located). Pt s/s been going on for 2 months. Pt completed macrobid  back in early June 2025 for UTI. Pt is A&Ox4 in triage. Pt is concerned about being septic. Pt has not had any fevers at home.

## 2023-08-31 NOTE — ED Notes (Signed)
Pt refused CT. EDP made aware.

## 2023-08-31 NOTE — ED Provider Notes (Signed)
 [place] EMERGENCY DEPARTMENT AT [facility] Provider Note   CSN: [id number] Arrival date & time: 08/31/23  8042     Patient presents with: Recurrent UTI and Ear Pain   [name] [name] is a 73 y.o. female.   HPI 73 year old female presents with a chief complaint of UTI.  She states that for the past 2 months since finishing antibiotics for UTI she has been having recurrent UTI symptoms.  This includes odor and dysuria.  She also feels like she is urinating less.  She has been having on and off headaches and dizziness during this time.  No fevers.  Both of her ears have been hurting and she has had a sore throat for these past 2 months.  She has seen her PCP about all the symptoms.  She feels like she is also developing a knot in her left neck that she is worried is an inflamed lymph node.  She is worried about possible sepsis so she presents to the ER today.  Prior to Admission medications   Medication Sig Start Date End Date Taking? Authorizing Provider  cephALEXin  (KEFLEX ) 500 MG capsule Take 1 capsule (500 mg total) by mouth 2 (two) times daily for 7 days. 08/31/23 09/07/23 Yes Freddi Hamilton, MD  albuterol  (VENTOLIN  HFA) 108 (90 Base) MCG/ACT inhaler Inhale 2 puffs into the lungs every 4 (four) hours as needed for shortness of breath or wheezing. 07/20/23   [provider]  aspirin  (ASPIRIN  LOW DOSE) 81 MG chewable tablet Chew 81 mg by mouth daily. 05/26/11   [provider]  atorvastatin  (LIPITOR) 40 MG tablet Take 1 tablet (40 mg total) by mouth daily. 12/26/22   [facility] Debby LABOR, MD  carvedilol  (COREG ) 3.125 MG tablet Take 3.125 mg by mouth 2 (two) times daily with a meal. 07/24/23 08/27/24  [provider]  ciprofloxacin (CIPRO) 500 MG tablet Take 500 mg by mouth 2 (two) times daily. 07/24/23   [provider]  diclofenac  Sodium (VOLTAREN ) 1 % GEL Apply 2 g topically as needed (pain). 07/30/18   [provider]  HIPREX  1 g tablet Take 1  tablet daily at bedtime 07/06/23   McKenzie, Belvie CROME, MD  losartan  (COZAAR ) 50 MG tablet Take 1 tablet (50 mg total) by mouth daily. 03/02/23 05/31/23  O'NealDarryle Debby, MD  pantoprazole  (PROTONIX ) 40 MG tablet Take 1 tablet (40 mg total) by mouth daily. 03/27/23 09/23/23  Ahmed, Muhammad F, MD  pregabalin (LYRICA) 25 MG capsule Take 25 mg by mouth at bedtime. 11/30/22   [provider]  spironolactone  (ALDACTONE ) 25 MG tablet Take 1 tablet (25 mg total) by mouth daily. 12/20/22 04/24/23  [facility] Debby LABOR, MD  traMADol  (ULTRAM ) 50 MG tablet Take 25 mg by mouth as needed for moderate pain (pain score 4-6). 09/18/18   [provider]  triamcinolone cream (KENALOG) 0.1 % Apply 1 Application topically 2 (two) times daily. 08/24/23   [provider]  Vitamin D-Vitamin K (VITAMIN K2-VITAMIN D3 PO) Take 1 tablet by mouth daily.    [provider]    Allergies: Hydralazine , Trimethoprim , Crestor  [rosuvastatin ], Hydrochlorothiazide , Levaquin  [levofloxacin ], Amlodipine, Ampicillin , Cetirizine & related, Eplerenone , Latex, Levofloxacin , Lisinopril , Prednisone , and Tape    Review of Systems  Constitutional:  Negative for fever.  Eyes:  Negative for visual disturbance.  Respiratory:  Negative for shortness of breath.   Cardiovascular:  Negative for chest pain.  Gastrointestinal:  Negative for abdominal pain.  Genitourinary:  Positive for dysuria.  Neurological:  Positive for dizziness and headaches. Negative for weakness and numbness.    Updated Vital Signs BP (!) 159/65 (BP Location: Right Arm)   Pulse 69   Temp 97.9 F (36.6 C)   Resp 18   Ht 5' 3 (1.6 m)   Wt 75.8 kg   SpO2 99%   BMI 29.58 kg/m   Physical Exam Vitals and nursing note reviewed.  Constitutional:      Appearance: She is well-developed.  HENT:     Head: Normocephalic and atraumatic.     Right Ear: Tympanic membrane normal.     Left Ear: Tympanic membrane normal.     Mouth/Throat:      Pharynx: Oropharynx is clear. No oropharyngeal exudate or posterior oropharyngeal erythema.  Eyes:     Extraocular Movements: Extraocular movements intact.     Pupils: Pupils are equal, round, and reactive to light.  Neck:   Cardiovascular:     Rate and Rhythm: Normal rate and regular rhythm.     Heart sounds: Normal heart sounds.  Pulmonary:     Effort: Pulmonary effort is normal.     Breath sounds: Normal breath sounds.  Abdominal:     Palpations: Abdomen is soft.     Tenderness: There is no abdominal tenderness.  Musculoskeletal:     Cervical back: Neck supple.  Skin:    General: Skin is warm and dry.  Neurological:     Mental Status: She is alert.     Comments: CN 3-12 grossly intact. 5/5 strength in all 4 extremities. Grossly normal sensation. Normal finger to nose.      (all labs ordered are listed, but only abnormal results are displayed) Labs Reviewed  CBC WITH DIFFERENTIAL/PLATELET - Abnormal; Notable for the following components:      Result Value   Platelets 422 (*)    All other components within normal limits  URINALYSIS, W/ REFLEX TO CULTURE (INFECTION SUSPECTED) - Abnormal; Notable for the following components:   APPearance HAZY (*)    Leukocytes,Ua TRACE (*)    Bacteria, UA RARE (*)    All other components within normal limits  URINE CULTURE  COMPREHENSIVE METABOLIC PANEL WITH GFR    EKG: EKG Interpretation Date/Time:  Friday August 31 2023 21:58:58 EDT Ventricular Rate:  61 PR Interval:  161 QRS Duration:  89 QT Interval:  412 QTC Calculation: 415 R Axis:   75  Text Interpretation: Sinus rhythm Low voltage, precordial leads Nonspecific T abnrm, anterolateral leads T wave changes similar to mar 2025 Confirmed by Freddi Hamilton [phone]) on 08/31/2023 10:37:57 PM  Radiology: No results found.   Procedures   Medications Ordered in the ED - No data to display                                  Medical Decision Making Amount and/or Complexity of  Data Reviewed External Data Reviewed: notes. Labs: ordered. Decision-making details documented in ED Course.    Details: UA with greater than 50 WBCs Normal WBC Radiology: ordered.  Risk Prescription drug management.   Patient presents with subacute symptoms.  She is concerned about a UTI which she has had multiple times before.  Urine is questionable with greater than 50 WBCs but no nitrites.  She prefers to start treatment and the urine will be sent for culture.  She states Keflex  typically works for her.  Otherwise there is no acute indication that  she has sepsis.  She also has complaints of on and off dizziness and this neck pain.  I do not feel any obvious enlarged lymph nodes in her oropharynx and airway are clear.  I did offer CTA of the head and neck given the dizziness and left-sided neck pain to evaluate for acute abnormality such as vertebral artery dissection.  She declines this.  Will have her follow-up with her PCP and she will be given return precautions but at this point she appears stable for discharge.     Final diagnoses:  Acute urinary tract infection    ED Discharge Orders          Ordered    cephALEXin  (KEFLEX ) 500 MG capsule  2 times daily        08/31/23 2247               Freddi Hamilton, MD 08/31/23 2333

## 2023-09-02 DIAGNOSIS — I10 Essential (primary) hypertension: Secondary | ICD-10-CM | POA: Diagnosis not present

## 2023-09-03 ENCOUNTER — Telehealth: Payer: Self-pay

## 2023-09-03 LAB — URINE CULTURE: Culture: >=100000 — AB

## 2023-09-03 NOTE — Telephone Encounter (Signed)
 Please scheduled for an office visit with Lauraine, she has an opening next week at 1030.

## 2023-09-03 NOTE — Telephone Encounter (Signed)
 Patient was seen in Emergency Department. Has a UTI.  Would like to discuss maintenance antibiotic.   Please advise.  Call:  [phone]

## 2023-09-04 ENCOUNTER — Telehealth (HOSPITAL_BASED_OUTPATIENT_CLINIC_OR_DEPARTMENT_OTHER): Payer: Self-pay

## 2023-09-04 NOTE — Telephone Encounter (Signed)
 Post ED Visit - Positive Culture Follow-up  Culture report reviewed by antimicrobial stewardship pharmacist: [facility] [x]  Leonor Bash, [place].D. []  Venetia Gully, Pharm.D., BCPS AQ-ID []  Garrel Crews, Pharm.D., BCPS []  Almarie Lunger, Pharm.D., BCPS []  [place], [address].D., BCPS, AAHIVP []  Rosaline Bihari, Pharm.D., BCPS, AAHIVP []  Vernell Meier, PharmD, BCPS []  Latanya Hint, PharmD, BCPS []  Donald Medley, PharmD, BCPS []  Rocky Bold, PharmD []  Dorothyann Alert, PharmD, BCPS []  Morene Babe, PharmD  [facility] []  Rosaline Edison, PharmD []  Romona Bliss, PharmD []  Dolphus Roller, PharmD []  Veva Seip, Rph []  Vernell Daunt) Leonce, PharmD []  Eva Allis, PharmD []  Rosaline Millet, PharmD []  Iantha Batch, PharmD []  Arvin Gauss, PharmD []  Wanda Hasting, PharmD []  Ronal Rav, PharmD []  Rocky Slade, PharmD []  Bard Jeans, PharmD   Positive Urine culture Treated with Cephalexin  and Ciprofloxacin, organism sensitive to the same and no further patient follow-up is required at this time.  [name] [name] 09/04/2023, 9:47 AM

## 2023-09-11 ENCOUNTER — Ambulatory Visit: Admitting: Urology

## 2023-09-11 ENCOUNTER — Encounter: Payer: Self-pay | Admitting: Urology

## 2023-09-11 VITALS — BP 138/68 | HR 60 | Temp 98.3°F

## 2023-09-11 DIAGNOSIS — N952 Postmenopausal atrophic vaginitis: Secondary | ICD-10-CM

## 2023-09-11 DIAGNOSIS — R159 Full incontinence of feces: Secondary | ICD-10-CM

## 2023-09-11 DIAGNOSIS — N281 Cyst of kidney, acquired: Secondary | ICD-10-CM

## 2023-09-11 DIAGNOSIS — N390 Urinary tract infection, site not specified: Secondary | ICD-10-CM | POA: Diagnosis not present

## 2023-09-11 DIAGNOSIS — Z87440 Personal history of urinary (tract) infections: Secondary | ICD-10-CM

## 2023-09-11 LAB — URINALYSIS, ROUTINE W REFLEX MICROSCOPIC
Bilirubin, UA: NEGATIVE
Glucose, UA: NEGATIVE
Leukocytes,UA: NEGATIVE
Nitrite, UA: NEGATIVE
Protein,UA: NEGATIVE
RBC, UA: NEGATIVE
Specific Gravity, UA: 1.025 (ref 1.005–1.030)
Urobilinogen, Ur: 1 mg/dL (ref 0.2–1.0)
pH, UA: 6 (ref 5.0–7.5)

## 2023-09-11 LAB — BLADDER SCAN AMB NON-IMAGING: Scan Result: 0

## 2023-09-11 MED ORDER — ESTROGENS CONJUGATED 0.625 MG/GM VA CREA: TOPICAL_CREAM | VAGINAL | 3 refills | Status: DC

## 2023-09-11 MED ORDER — CEPHALEXIN 250 MG PO CAPS: 250.0000 mg | ORAL_CAPSULE | Freq: Every day | ORAL | 11 refills | Status: DC

## 2023-09-11 NOTE — Progress Notes (Signed)
 Bladder Scan completed today.  Patient can void prior to the bladder scan. Bladder scan result: 0  Performed By: Lorrayne Lpn

## 2023-09-11 NOTE — Patient Instructions (Addendum)
 UTI prevention / management:  UTI symptoms may include:  - Pain / burning / discomfort when urinating - Recent increase in urinary urgency (how quickly you feel like you need to rush to the bathroom) - Recent increase in urinary frequency (how often you are urinating) - Fever - Acute mental status change / confusion - Fatigue / Feeling tired - Weakness - Note: Urine color, clarity, and odor are not considered to be clinically significant indicators of UTI and do not warrant urine testing unless patient is also experiencing UTI symptoms such as those listed above.  Difference between Urinalysis (urine dipstick test) and Urine culture / Why urine culture often needed to determine appropriate diagnosis and treatment of urologic symptoms: > Urinalysis (urine dipstick test): A quick office test used as an indicator to determine whether or not further testing is necessary (such as a urine culture, urine microscopy, etc.) The urinalysis cannot differentiate a true bacterial UTI or give a definitive diagnosis for the findings.  > Urine culture: May be performed based on the findings of a urinalysis to evaluate for UTI. Grows out on a petri dish for 48-72 hours. Provides important information about: whether or not bacterial growth is present and if so: what the predominant bacteria is which antibiotics will work best against that bacteria That information is important so that we can diagnose and treat patients appropriately as there are other conditions which may mimic UTls which must not be missed (such as cancer, interstitial cystitis, stones, etc.). Assists us  with antibiotic stewardship to minimize patient's risk for developing antibiotic resistance (getting to a point where no antibiotics work anymore).  Options when UTI symptoms occur: 1. Call [facility] [place] and request to speak with triage nurse (phone # [phone], select option 3). In accordance with clinic guidelines  the nurse will determine next steps based on patient-reported symptoms, which may include: same-day lab visit to provide urine specimen, recommendation to schedule Urology office visit appointment for further evaluation, recommendation to proceed to ER, etc. 2. Call your Primary Care Provider (PCP) office to request urgent / same-day visit. Be sure to request for urine culture to be ordered and have results faxed to Urology (fax # [phone]).  3. Go to urgent care. Be sure to request for urine culture to be ordered and have results faxed to Urology (fax # [phone]).   For bladder pain/ burning with urination: - Can take over-the-counter Pyridium  (phenazopyridine ; commonly known under the AZO brand) for a few days as needed. Limit use to no more than 3 days consecutively due to risk for methemoglobinemia, liver function issues, and bone health damage with long term use of Pyridium . - Alternative: Prescription urinary analgesics (such as Uribel, Urogesic blue, Urelle, Uro-MP). Often expensive / poorly covered by insurance unfortunately.  Options / recommendations for UTI prevention: - Low dose antibiotic daily for UTI prophylaxis. - Topical vaginal estrogen for vaginal atrophy (aka Genitourinary Syndrome of Menopause (GSM)). - Adequate daily fluid intake to flush out the urinary tract. - Go to the bathroom to urinate every 4-6 hours while awake to minimize urinary stasis / bacterial overgrowth in the bladder. - Proanthocyanidin (PAC) supplement 36 mg daily; must be soluble (insoluble form of PAC will be ineffective). Recommended brand: Ellura. This is an over-the-counter supplement (often must be found/ purchased online) supplement derived from cranberries with concentrated active component: Proanthocyanidin (PAC) 36 mg daily. Decreases bacterial adherence to bladder lining.  - D-mannose powder (2 grams daily). This is an over-the-counter supplement  which decreases bacterial adherence to bladder  lining (it is a sugar that inhibits bacterial adherence to urothelial cells by binding to the pili of enteric bacteria). Take as per manufacturer recommendation. Can be used as an alternative or in addition to the concentrated cranberry supplement.  - Vitamin C supplement to acidify urine to minimize bacterial growth.  - Probiotic to maintain healthy vaginal microbiome to suppress bacteria at urethral opening. Brand recommendations: Verlon (includes probiotic & D-mannose ), Feminine Balance (highest concentration of lactobacillus) or Hyperbiotic Pro 15.  Note for patients with diabetes:  - Be aware that D-mannose contains sugar.  Note for patients with interstitial cystitis (IC):  - Patients with IC should typically avoid cranberry/ PAC supplements and Vitamin C supplements due to their acidity, which may exacerbate IC-related bladder pain. - Symptoms of true bacterial UTI can overlap / mimic symptoms of an IC flare up. Antibiotic use is NOT indicated for IC flare ups. Urine culture needed prior to antibiotic treatment for IC patients. The goal is to minimize your risk for developing antibiotic-resistant bacteria.    Vaginal atrophy I Genitourinary syndrome of menopause (GSM):  What it is: Changes in the vaginal environment (including the vulva and urethra) including: Thinning of the epithelium (skin/ mucosa surface) Can contribute to urinary urgency and frequency Can contribute to dryness, itching, irritation of the vulvar and vaginal tissue Can contribute to pain with intercourse Can contribute to physical changes of the labia, vulva, and vagina such as: Narrowing of the vaginal opening Decreased vaginal length Loss of labial architecture Labial adhesions Pale color of vulvovaginal tissue Loss of pubic hair Allows bacteria to become adherent Results in increased risk for urinary tract infection (UTI) due to bacterial overgrowth and migration up the urethra into the bladder Change in  vaginal pH (acid/ base balance) Allows for alteration / disruption of the normal bacterial flora / microbiome Results in increased risk for urinary tract infection (UTI) due to bacterial overgrowth  Treatment options: Over-the-counter lubricants (see list below). Prescription vaginal estrogen replacement. Options: Topical vaginal estrogen (estradiol ) cream: (Estrace , Premarin , compounded) Apply as directed Estring  vaginal ring Exchanged every 3 months (either at home or in office by provider) Vagifem  vaginal tablet Inserted nightly for 2 weeks then twice a week (long term) lntrarosa vaginal suppository Vaginal DHEA: converts to estrogen in vaginal tissue without systemic effect Inserted nightly (long term) 3. Vaginal laser therapy (Mona Olam touch) Performed in 3 treatments each 6 weeks apart (available in our [place] office). Can feel like a sunburn for 3-4 days after each treatment until new skin heals in. Usually not covered by insurance. Estimated cost is $1500 for all 3 sessions.  FYI regarding prescription vaginal estrogen treatment options: - All topical vaginal estrogen replacement options are equivalent in terms of efficacy. - Topical vaginal estrogen replacement will take about 3 months to be effective. - OK to have sex with any of the topical vaginal estrogen replacement options. - Topical vaginal estrogen replacement may sting/burn initially due to severe dryness, which will improve with ongoing treatment. - Studies have demonstrated negligible systemic absorption of low-dose intravaginal estrogen cream therefore the risk for cancer development or recurrence with this medication is minimal.  Genitourinary Syndrome of Menopause: AUA/SUFU/AUGS Guideline (2025) [place]  Topical vaginal estrogen cream safe to use with breast cancer  history [email or web address]  Topical vaginal estrogen cream safe to use with blood clot history [email or web address]   Lubricants and Moisturizers for Treating Genitourinary Syndrome of Menopause and Vulvovaginal Atrophy Treatment Comments I  Available Products   Lubricants   Water-based Ingredients: Deionized water, glycerin, propylene glycol; latex safe; rare irritation; dry out with extended sexual activity Astroglide, Good Clean Love, K-Y Jelly, Natural, Organic, Pink, Sliquid, Sylk, Yes    Oil Based Ingredients: avocado, olive, peanut, corn; latex safe; can be used with silicone products; staining; safe (unless peanut allergy); non-irritating Coconut oil, vegetable oil, vitamin E oil  Silicone-Based Ingredients: Silicone polymers; staining; typically nonirritating, long lasting; waterproof; should not be used with silicone dilators, sexual toys, or gynecologic products Astroglide X, Oceanus Ultra Pure, Pink Silicone, Pjur Eros, Replens Silky Smooth, Silicone Premium JO, SKYN, Uberlube, [employer] Based Minimize harm to sperm motility; designed for couples trying to conceive:  Astroglide TTC, Conceive Plus, PreSeed, Yes Baby  Fertility Friendly Minimize harm to sperm motility; designed for couples trying to conceive:  Astroglide, TTC, Conceive Plus, PreSeed, Yes Baby  Vaginal Moisturizers   Vaginal Moisturizers For maintenance use 1 to 3 times weekly; can benefit women with dryness, chafing with AOL, and recurrent vaginal infections irrespective of sexual activity timing Balance Active Menopause Vaginal Moisturizing Lubricant, Canesintima Intimate Moisturizer, Replens, Rephresh, Sylk Natural Intimate Moisturizer, Yes Vaginal  Moisturizer  Hybrids Properties of both water and silicone-based products (combination of a vaginal lubricant and moisturizer); Non-irritating; good option for women with allergies and sensitivities Lubrigyn, Luvena  Suppositories Hyaluronic acid to retain moisture Revaree  Vulvar Soothing Creams/Oils    Medicated Creams Pain and burn relief; Ingredients: 4% Lidocaine , Aloe Vera gel Releveum ([place])  Non-Medicated Creams For anti-itch and moisture/maintenance; Ingredients: Coconut oil, Avocado oil, Shea Butter, Olive oil, Vitamin E Vajuvenate, Vmagic  Oils for moisture/maintenance: Coconut oil, Vitamin E oil, Emu oil                  Comparison of Fiber Supplements The best way to consume 35 grams of fiber per day is through a healthy diet, but that may not always be possible. Fiber supplements are available without a prescription to help increase fiber consumption.  Comparison of powder fiber brands:   Metamucil Citrucel Benefiber Fibersure   Active ingredient Psyllium husk Methylcellulos e Wheat dextrin lnulin   Is active* natural? Natural Semi-synthetic Natural Natural   FDA approval for laxation? Yes Yes No No   Grams of active/day for laxation 2.5-30 grams 4-6 grams Not approved as laxative Not approved as laxative   Amount of active/dose 3.4 grams 2 grams 3 grams 5 grams   Required # of doses/day for laxation 1 2 Not approved as laxative Not approved as laxative   Soluble fiber/ Insoluble fiber? 70% soluble 100% soluble 100% soluble 100% soluble   Active holds water? Yes Yes No No    Active forms a gel? Yes No No No  Active bulks stools? Yes Yes No No  tive traps bile acids? Yes No No Inadequate data  Active is fermentable? Partially No Yes Yes  Helps lower blood cholesterol? Yes Minimally No No  Helps lower blood pressure? Yes Inadequate data Inadequate data Inadequate data  Helps lower blood sugar? Yes Inadequate Yes No data  Helps lower  the risk of heart disease? Yes Inadequate data Inadequate data Inadequate data    Comparison of solid dose fiber brands:   Metamucil Citrucel FiberCon FiberChoice    Capsules Swallowable Swallowable      Caplets tablets    Active Psyllium husk Methylcellulose Calcium  lnulin   ingredient   Polycarbophil    Is active* Natural Semi-synthetic Synthetic Natural  natural?       FDA approval Yes Yes Yes No   for laxation?       Grams of 2.5-30 grams 4-6 grams 4-6 grams 5 grams   active/day for       laxation       Amount of 0.525 grams 0.5 0.625 2 grams/tablet   active/dose /capsule= 5 grams/caplet = grams/caplet = 2 tablets/dose    capsules/dose 2 caplets/dose =2       caplets/dose    Required # of 1 4 4  n/a   doses/day for       laxation       Required # of 5 8 8  n/a   caplets/day for       laxation       Soluble fiber/ 70% soluble 100% soluble 100% soluble 100% soluble   Insoluble fiber?        Active holds water? Yes Yes Yes No  Active forms a gel? Yes No Yes No  Active bulks stools? Yes Yes Yes No  Active traps bile acids? Yes Yes Inadequate data Inadequate data  Active is fermentable? Partially No No Yes  Helps lower blood cholesterol? Yes Minimally No No    *Active refers to the active ingredient in the supplement. Resource: www.nationalfibercouncil.org

## 2023-09-11 NOTE — Progress Notes (Signed)
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       caplets/dose    Required # of 1 4 4  n/a   doses/day for       laxation       Required # of 5 8 8  n/a   caplets/day for       laxation       Soluble fiber/ 70% soluble 100% soluble 100% soluble 100% soluble   Insoluble fiber?        Active holds water? Yes Yes Yes No  Active forms a gel? Yes No Yes No  Active bulks stools? Yes Yes Yes No  Active traps bile acids? Yes Yes Inadequate data Inadequate data  Active is fermentable? Partially No No Yes  Helps lower blood cholesterol? Yes Minimally No No    *Active refers to the active ingredient in  the supplement. Resource: www.nationalfibercouncil.org   Total time spent  caring for the patient today was over 40 minutes. This includes time spent on the date of the visit reviewing the patient's chart before the visit, time spent during the visit, and time spent after the visit on documentation. Over 50% of that time was spent in face-to-face time with this patient for direct counseling. E&M based on time and complexity of medical decision making.   Electronically signed by:  Lauraine JAYSON Oz, FNP   09/11/23    2:16 PM

## 2023-10-01 DIAGNOSIS — D1801 Hemangioma of skin and subcutaneous tissue: Secondary | ICD-10-CM | POA: Diagnosis not present

## 2023-10-02 DIAGNOSIS — N390 Urinary tract infection, site not specified: Secondary | ICD-10-CM | POA: Diagnosis not present

## 2023-10-02 DIAGNOSIS — G4700 Insomnia, unspecified: Secondary | ICD-10-CM | POA: Diagnosis not present

## 2023-10-02 DIAGNOSIS — Z299 Encounter for prophylactic measures, unspecified: Secondary | ICD-10-CM | POA: Diagnosis not present

## 2023-10-02 DIAGNOSIS — R52 Pain, unspecified: Secondary | ICD-10-CM | POA: Diagnosis not present

## 2023-10-02 DIAGNOSIS — I10 Essential (primary) hypertension: Secondary | ICD-10-CM | POA: Diagnosis not present

## 2023-10-05 ENCOUNTER — Encounter: Payer: Self-pay | Admitting: Radiology

## 2023-10-16 DIAGNOSIS — R002 Palpitations: Secondary | ICD-10-CM | POA: Diagnosis not present

## 2023-10-22 ENCOUNTER — Ambulatory Visit: Payer: Self-pay

## 2023-10-22 NOTE — Telephone Encounter (Signed)
 FYI Only or Action Required?: FYI only for provider.  Patient was last seen in primary care on 06/27/2021 by Cherylene Homer HERO, NP.  Called Nurse Triage reporting Allergic Reaction.  Symptoms began yesterday.  Interventions attempted: OTC medications: Benadryl , Albuterol .  Symptoms are: gradually improving.  Triage Disposition: See PCP When Office is Open (Within 3 Days)  Patient/caregiver understands and will follow disposition?: Yes Reason for Disposition  [1] Took antihistamine (e.g., Benadryl ) by mouth AND [2] no symptoms now  Answer Assessment - Initial Assessment Questions Started Rosuvastatin  last week. Took benadryl  and albuterol  last night and found relief, was not able to sleep much, coughing clear mucous. Not coughing right now. Hx with allergic rhinitis. Denies hives or rash. Denies facial swelling.   1. SYMPTOMS: What is the most serious symptom?     Swollen throat, so much mucous, coughing and gagging.   2. AIRWAY: Are they breathing? (e.g., Yes, No)  (R/O: airway blockage, respiratory arrest)      Feels airway is clear now.  3. BREATHING: Is there difficulty breathing? (e.g., Yes, No; wheezing, unable to complete a sentence)  (R/O: respiratory distress)     Little wheezing  4. CIRCULATION: Are you feeling weak?  (e.g., Yes, No, Unknown; severity)  (R/O: shock) If Yes, ask: Can you stand and walk normally?     Feels weak, able to walk not feeling dizzy.  5. SWALLOWING: Can you swallow? (e.g., Yes, No; food, fluid, saliva)      Swallowing fine, drank coffee.   6. ONSET: When did the reaction start? (e.g., minutes, hours ago)      Last night  7. SUBSTANCE: What are you reacting to? When did the contact occur?      Rosuvastatin    8. PREVIOUS REACTION: Have you ever reacted to it before? If Yes, ask: What happened that time?     Took Rosuvastatin  in the past but can't remember what happened last time.  Protocols used: Anaphylaxis-A-AH Copied  from CRM [id number]. Topic: Clinical - Red Word Triage >> Oct 22, 2023  9:47 AM Willma R wrote: Red Word that prompted transfer to Nurse Triage: Patient thinks she had an allergic reaction last night to rosuvastatin  (CRESTOR ) 10 MG tablet. Says her throat was swollen, she was gaging and choking, took benadryl  but is doing better today. Now she has a productive cough and chest congestion. Has a new patient appointment for 11/14 but would like to be seen earlier if possible.

## 2023-10-22 NOTE — Telephone Encounter (Signed)
 Appt made.

## 2023-10-23 ENCOUNTER — Ambulatory Visit (INDEPENDENT_AMBULATORY_CARE_PROVIDER_SITE_OTHER): Admitting: Family

## 2023-10-23 ENCOUNTER — Ambulatory Visit: Payer: Self-pay | Admitting: Family

## 2023-10-23 ENCOUNTER — Encounter: Payer: Self-pay | Admitting: Family

## 2023-10-23 VITALS — BP 135/74 | HR 76 | Temp 98.0°F | Ht 63.0 in | Wt 168.8 lb

## 2023-10-23 DIAGNOSIS — I10 Essential (primary) hypertension: Secondary | ICD-10-CM | POA: Diagnosis not present

## 2023-10-23 DIAGNOSIS — Z0001 Encounter for general adult medical examination with abnormal findings: Secondary | ICD-10-CM | POA: Diagnosis not present

## 2023-10-23 DIAGNOSIS — Z1159 Encounter for screening for other viral diseases: Secondary | ICD-10-CM | POA: Diagnosis not present

## 2023-10-23 DIAGNOSIS — Z951 Presence of aortocoronary bypass graft: Secondary | ICD-10-CM

## 2023-10-23 DIAGNOSIS — R051 Acute cough: Secondary | ICD-10-CM | POA: Diagnosis not present

## 2023-10-23 DIAGNOSIS — I2510 Atherosclerotic heart disease of native coronary artery without angina pectoris: Secondary | ICD-10-CM | POA: Diagnosis not present

## 2023-10-23 DIAGNOSIS — F5101 Primary insomnia: Secondary | ICD-10-CM

## 2023-10-23 DIAGNOSIS — Z0000 Encounter for general adult medical examination without abnormal findings: Secondary | ICD-10-CM

## 2023-10-23 DIAGNOSIS — K219 Gastro-esophageal reflux disease without esophagitis: Secondary | ICD-10-CM | POA: Diagnosis not present

## 2023-10-23 DIAGNOSIS — E785 Hyperlipidemia, unspecified: Secondary | ICD-10-CM | POA: Diagnosis not present

## 2023-10-23 LAB — VERITOR FLU A/B WAIVED
Influenza A: NEGATIVE
Influenza B: NEGATIVE

## 2023-10-23 LAB — LIPID PANEL

## 2023-10-23 MED ORDER — DOXEPIN HCL 10 MG PO CAPS: 10.0000 mg | ORAL_CAPSULE | Freq: Every day | ORAL | 1 refills | Status: DC

## 2023-10-23 MED ORDER — FLUTICASONE PROPIONATE 50 MCG/ACT NA SUSP: 2.0000 | Freq: Every day | NASAL | 6 refills | Status: DC

## 2023-10-23 MED ORDER — LORATADINE 10 MG PO TABS: 10.0000 mg | ORAL_TABLET | Freq: Every day | ORAL | 11 refills | Status: DC

## 2023-10-23 MED ORDER — BENZONATATE 200 MG PO CAPS: 200.0000 mg | ORAL_CAPSULE | Freq: Three times a day (TID) | ORAL | 1 refills | Status: DC | PRN

## 2023-10-23 MED ORDER — ALBUTEROL SULFATE HFA 108 (90 BASE) MCG/ACT IN AERS: 2.0000 | INHALATION_SPRAY | RESPIRATORY_TRACT | 2 refills | Status: AC | PRN

## 2023-10-23 NOTE — Progress Notes (Signed)
 Subjective:    Patient ID: [name] [name], female    DOB: 04-29-1950, 73 y.o.   MRN: [id number]  Chief Complaint  Patient presents with   New Patient (Initial Visit)   Allergic Reaction   PT presents to the office today to establish care.   She is followed by Urologists for recurrent UTI's.   She is followed by Cardiologists every 6 months for CAD and hx of CABG.   She reports starting to have a fever, cough, and increase mucus. She thought she was having an allergic reaction to her lipitor.  Hypertension This is a chronic problem. The current episode started more than 1 year ago. The problem has been resolved since onset. The problem is controlled. Associated symptoms include headaches, malaise/fatigue and shortness of breath. Pertinent negatives include no peripheral edema. Risk factors for coronary artery disease include obesity, dyslipidemia and sedentary lifestyle. The current treatment provides moderate improvement.  Hyperlipidemia This is a chronic problem. The problem is controlled. Recent lipid tests were reviewed and are normal. Associated symptoms include myalgias and shortness of breath. Current antihyperlipidemic treatment includes statins. The current treatment provides moderate improvement of lipids. Risk factors for coronary artery disease include hypertension, dyslipidemia, a sedentary lifestyle and post-menopausal.  Gastroesophageal Reflux She complains of belching, coughing, heartburn and wheezing. She reports no sore throat. This is a chronic problem. The current episode started more than 1 year ago. The problem occurs occasionally. The problem has been resolved. The symptoms are aggravated by certain foods. She has tried a PPI for the symptoms. The treatment provided moderate relief.  Cough This is a new problem. The current episode started in the past 7 days. The problem has been gradually worsening. The problem occurs every few minutes. The cough is Productive of sputum.  Associated symptoms include chills, ear congestion, a fever (101F), headaches, heartburn, myalgias, nasal congestion, shortness of breath and wheezing. Pertinent negatives include no ear pain or sore throat. Associated symptoms comments: Nausea . She has tried rest (tylenol  and motrin ) for the symptoms. The treatment provided mild relief.  Insomnia Primary symptoms: sleep disturbance, difficulty falling asleep, frequent awakening, malaise/fatigue.   The current episode started more than one year. The onset quality is gradual. The problem occurs intermittently. The symptoms are relieved by medication. The treatment provided mild relief.      Review of Systems  Constitutional:  Positive for chills, fever (101F) and malaise/fatigue.  HENT:  Negative for ear pain and sore throat.   Respiratory:  Positive for cough, shortness of breath and wheezing.   Gastrointestinal:  Positive for heartburn.  Musculoskeletal:  Positive for myalgias.  Neurological:  Positive for headaches.  Psychiatric/Behavioral:  Positive for sleep disturbance. The patient has insomnia.   All other systems reviewed and are negative.   Social History   Socioeconomic History   Marital status: Divorced    Spouse name: single   Number of children: 1   Years of education: Not on file   Highest education level: Some college, no degree  Occupational History   Occupation: Retired   Tobacco Use   Smoking status: Former    Current packs/day: 0.00    Average packs/day: 0.5 packs/day for 15.0 years (7.5 ttl pk-yrs)    Types: Cigarettes    Start date: 05/23/1991    Quit date: 05/23/2006    Years since quitting: 17.4   Smokeless tobacco: Never  Vaping Use   Vaping status: Never Used  Substance and Sexual Activity   Alcohol   use: Yes    Alcohol /week: 0.0 standard drinks of alcohol     Comment: rare glass of wine   Drug use: No   Sexual activity: Not Currently    Birth control/protection: Post-menopausal  Other Topics Concern    Not on file  Social History Narrative   Diet:  Fish and veggies - does not eat a lot of red meat   Do you drink/eat things with caffeine?  2 cups a day   Marital status:  Divorced   Do you live in a house, apartment, assisted living, condo, trailer, etc.)? Renting a one-story house   Is it one or more stories?  One   How many persons live in your home?  Lives alone   Do you have any pets in your home? (please list)  One dog   Education:  Some college   Current or past profession:  CNA   Do you exercise:  Yes - Walks 2-3 times a week.      ADVANCED DIRECTIVES  None.  Does want CPR.       FUNCTIONAL STATUS   Has no difficulties with ADLs.   Social Drivers of Health   Financial Resource Strain: Medium Risk (10/22/2023)   Overall Financial Resource Strain (CARDIA)    Difficulty of Paying Living Expenses: Somewhat hard  Food Insecurity: Food Insecurity Present (10/22/2023)   Hunger Vital Sign    Worried About Running Out of Food in the Last Year: Often true    Ran Out of Food in the Last Year: Sometimes true  Transportation Needs: No Transportation Needs (10/22/2023)   PRAPARE - Administrator, Civil Service (Medical): No    Lack of Transportation (Non-Medical): No  Physical Activity: Inactive (10/22/2023)   Exercise Vital Sign    Days of Exercise per Week: 0 days    Minutes of Exercise per Session: Not on file  Stress: Stress Concern Present (10/22/2023)   [employer] of Occupational Health - Occupational Stress Questionnaire    Feeling of Stress: Very much  Social Connections: Socially Isolated (10/22/2023)   Social Connection and Isolation Panel    Frequency of Communication with Friends and Family: Never    Frequency of Social Gatherings with Friends and Family: Never    Attends Religious Services: 1 to 4 times per year    Active Member of [employer] or Organizations: No    Attends Engineer, structural: Not on file    Marital Status: Divorced   Family History   Problem Relation Age of Onset   Mental illness Mother    Alcohol  abuse Mother    Drug abuse Sister    Cancer Brother        head and neck cancer   Alcohol  abuse Sister    Pancreatitis Sister    Hypertension Daughter    Fibroids Daughter    Lung cancer Maternal Grandmother    Heart disease Maternal Grandfather    Colon cancer Neg Hx    Breast cancer Neg Hx    Esophageal cancer Neg Hx    Rectal cancer Neg Hx    Stomach cancer Neg Hx         Objective:   Physical Exam Vitals reviewed.  Constitutional:      General: She is not in acute distress.    Appearance: She is well-developed.  HENT:     Head: Normocephalic and atraumatic.     Right Ear: Tympanic membrane normal.     Left Ear: Tympanic  membrane normal.  Eyes:     Pupils: Pupils are equal, round, and reactive to light.  Neck:     Thyroid : No thyromegaly.  Cardiovascular:     Rate and Rhythm: Normal rate and regular rhythm.     Heart sounds: Normal heart sounds. No murmur heard. Pulmonary:     Effort: Pulmonary effort is normal. No respiratory distress.     Breath sounds: Wheezing present.     Comments: Dry nonproductive cough Abdominal:     General: Bowel sounds are normal. There is no distension.     Palpations: Abdomen is soft.     Tenderness: There is no abdominal tenderness.  Musculoskeletal:        General: No tenderness. Normal range of motion.     Cervical back: Normal range of motion and neck supple.  Skin:    General: Skin is warm and dry.  Neurological:     Mental Status: She is alert and oriented to person, place, and time.     Cranial Nerves: No cranial nerve deficit.     Deep Tendon Reflexes: Reflexes are normal and symmetric.  Psychiatric:        Behavior: Behavior normal.        Thought Content: Thought content normal.        Judgment: Judgment normal.       BP 135/74   Pulse 76   Temp 98 F (36.7 C)   Ht 5' 3 (1.6 m)   Wt 168 lb 12.8 oz (76.6 kg)   SpO2 94%   BMI 29.90 kg/m       Assessment & Plan:  SHACOYA BURKHAMMER comes in today with chief complaint of New Patient (Initial Visit) and Allergic Reaction   Diagnosis and orders addressed:  1. Annual physical exam (Primary) - CMP14+EGFR - CBC with Differential/Platelet - Lipid panel - TSH - Hepatitis C antibody  2. Essential hypertension - CMP14+EGFR - CBC with Differential/Platelet - TSH  3. Hyperlipidemia LDL goal <70 - CMP14+EGFR - CBC with Differential/Platelet - Lipid panel  4. Gastroesophageal reflux disease without esophagitis - CMP14+EGFR - CBC with Differential/Platelet  5. Coronary artery disease involving native heart without angina pectoris, unspecified vessel or lesion type - CMP14+EGFR - CBC with Differential/Platelet  6. S/P CABG x 2 - CMP14+EGFR - CBC with Differential/Platelet  7. Acute cough Start Claritin , tessalon  - Take meds as prescribed - Use a cool mist humidifier  -Use saline nose sprays frequently -Force fluids -For any cough or congestion  Use plain Mucinex - regular strength or max strength is fine -For fever or aces or pains- take tylenol  or ibuprofen . -Throat lozenges if help -Follow up if symptoms worsen or do not improve  - CMP14+EGFR - CBC with Differential/Platelet - albuterol  (VENTOLIN  HFA) 108 (90 Base) MCG/ACT inhaler; Inhale 2 puffs into the lungs every 4 (four) hours as needed for shortness of breath or wheezing.  Dispense: 8 g; Refill: 2 - benzonatate  (TESSALON ) 200 MG capsule; Take 1 capsule (200 mg total) by mouth 3 (three) times daily as needed.  Dispense: 30 capsule; Refill: 1 - fluticasone  (FLONASE ) 50 MCG/ACT nasal spray; Place 2 sprays into both nostrils daily.  Dispense: 16 g; Refill: 6 - loratadine  (CLARITIN ) 10 MG tablet; Take 1 tablet (10 mg total) by mouth daily.  Dispense: 30 tablet; Refill: 11 - Veritor Flu A/B Waived - COVID-19, Flu A+B and RSV  8. Need for hepatitis C screening test - Hepatitis C antibody  9. Primary  insomnia Start  doxepin   Sleep ritual  Limit caffeine  - doxepin  (SINEQUAN ) 10 MG capsule; Take 1 capsule (10 mg total) by mouth at bedtime.  Dispense: 90 capsule; Refill: 1   Labs pending Will start doxepin  today COVID, Flu, and RSV pending  Start Claritin , tessalon , and mucinex  Continue current medications  Keep follow up with specialists  Health Maintenance reviewed Diet and exercise encouraged  Return in about 2 months (around 12/23/2023), or if symptoms worsen or fail to improve.    Bari Learn, FNP

## 2023-10-23 NOTE — Patient Instructions (Signed)

## 2023-10-24 LAB — CBC WITH DIFFERENTIAL/PLATELET
Basophils Absolute: 0.1 x10E3/uL (ref 0.0–0.2)
Basos: 1 %
EOS (ABSOLUTE): 0.1 x10E3/uL (ref 0.0–0.4)
Eos: 1 %
Hematocrit: 45.1 % (ref 34.0–46.6)
Hemoglobin: 14.5 g/dL (ref 11.1–15.9)
Immature Grans (Abs): 0.1 x10E3/uL (ref 0.0–0.1)
Immature Granulocytes: 1 %
Lymphocytes Absolute: 1.2 x10E3/uL (ref 0.7–3.1)
Lymphs: 14 %
MCH: 28.3 pg (ref 26.6–33.0)
MCHC: 32.2 g/dL (ref 31.5–35.7)
MCV: 88 fL (ref 79–97)
Monocytes Absolute: 1 x10E3/uL — ABNORMAL HIGH (ref 0.1–0.9)
Monocytes: 11 %
Neutrophils Absolute: 6.2 x10E3/uL (ref 1.4–7.0)
Neutrophils: 72 %
Platelets: 366 x10E3/uL (ref 150–450)
RBC: 5.13 x10E6/uL (ref 3.77–5.28)
RDW: 13.1 % (ref 11.7–15.4)
WBC: 8.5 x10E3/uL (ref 3.4–10.8)

## 2023-10-24 LAB — CMP14+EGFR
ALT: 14 IU/L (ref 0–32)
AST: 17 IU/L (ref 0–40)
Albumin: 4.2 g/dL (ref 3.8–4.8)
Alkaline Phosphatase: 70 IU/L (ref 44–121)
BUN/Creatinine Ratio: 14 (ref 12–28)
BUN: 11 mg/dL (ref 8–27)
Bilirubin Total: 0.4 mg/dL (ref 0.0–1.2)
CO2: 22 mmol/L (ref 20–29)
Calcium: 9.2 mg/dL (ref 8.7–10.3)
Chloride: 98 mmol/L (ref 96–106)
Creatinine, Ser: 0.81 mg/dL (ref 0.57–1.00)
Globulin, Total: 1.8 g/dL (ref 1.5–4.5)
Glucose: 111 mg/dL — AB (ref 70–99)
Potassium: 3.8 mmol/L (ref 3.5–5.2)
Sodium: 134 mmol/L (ref 134–144)
Total Protein: 6 g/dL (ref 6.0–8.5)
eGFR: 77 mL/min/1.73 (ref >59)

## 2023-10-24 LAB — LIPID PANEL
Cholesterol, Total: 138 mg/dL (ref 100–199)
HDL: 70 mg/dL (ref >39)
LDL CALC COMMENT:: 2 ratio (ref 0.0–4.4)
LDL Chol Calc (NIH): 51 mg/dL (ref 0–99)
Triglycerides: 88 mg/dL (ref 0–149)
VLDL Cholesterol Cal: 17 mg/dL (ref 5–40)

## 2023-10-24 LAB — COVID-19, FLU A+B AND RSV
Influenza A, NAA: NOT DETECTED
Influenza B, NAA: NOT DETECTED
RSV, NAA: NOT DETECTED
SARS-CoV-2, NAA: DETECTED — AB

## 2023-10-24 LAB — HEPATITIS C ANTIBODY: Hep C Virus Ab: NONREACTIVE

## 2023-10-24 LAB — TSH: TSH: 0.8 u[IU]/mL (ref 0.450–4.500)

## 2023-10-28 ENCOUNTER — Encounter (INDEPENDENT_AMBULATORY_CARE_PROVIDER_SITE_OTHER): Payer: Self-pay

## 2023-10-28 DIAGNOSIS — J019 Acute sinusitis, unspecified: Secondary | ICD-10-CM | POA: Diagnosis not present

## 2023-10-29 MED ORDER — DOXYCYCLINE HYCLATE 100 MG PO TABS: 100.0000 mg | ORAL_TABLET | Freq: Two times a day (BID) | ORAL | 0 refills | Status: DC

## 2023-10-29 NOTE — Telephone Encounter (Signed)
 Hello, I have sent in doxycyline to your pharmacy for a sinus infection. You can continue the OTC medications with these. I hope you feel better.   Bari Learn, FNP   Approximately 5 minutes was spent documenting and reviewing patient's chart.

## 2023-10-30 DIAGNOSIS — I472 Ventricular tachycardia, unspecified: Secondary | ICD-10-CM | POA: Diagnosis not present

## 2023-10-30 DIAGNOSIS — I471 Supraventricular tachycardia, unspecified: Secondary | ICD-10-CM | POA: Diagnosis not present

## 2023-10-30 DIAGNOSIS — R002 Palpitations: Secondary | ICD-10-CM | POA: Diagnosis not present

## 2023-11-05 DIAGNOSIS — F5101 Primary insomnia: Secondary | ICD-10-CM

## 2023-11-06 MED ORDER — DOXEPIN HCL 3 MG PO TABS: 3.0000 mg | ORAL_TABLET | Freq: Every evening | ORAL | 1 refills | Status: DC

## 2023-11-06 NOTE — Telephone Encounter (Signed)
 Doxepin  does not come in 5 mg. I have sent in the 3 mg. You can take 1-2 tabs at bedtime to see how this works for you.   Bari Learn, FNP

## 2023-11-07 ENCOUNTER — Telehealth: Payer: Self-pay

## 2023-11-07 ENCOUNTER — Other Ambulatory Visit (HOSPITAL_COMMUNITY): Payer: Self-pay

## 2023-11-07 ENCOUNTER — Encounter: Payer: Self-pay | Admitting: Oncology

## 2023-11-07 DIAGNOSIS — F5101 Primary insomnia: Secondary | ICD-10-CM

## 2023-11-07 NOTE — Telephone Encounter (Signed)
 Pharmacy Patient Advocate Encounter   Received notification from Onbase that prior authorization for Doxepin  3 mg tablets is required/requested.   Insurance verification completed.   The patient is insured through [place] .   Per test claim: Per test claim, medication is not covered due to plan/benefit exclusion, PA not submitted at this time

## 2023-11-09 MED ORDER — DOXEPIN HCL 10 MG PO CAPS: 10.0000 mg | ORAL_CAPSULE | Freq: Every day | ORAL | 1 refills | Status: DC

## 2023-11-09 NOTE — Telephone Encounter (Signed)
 Insurance denied Doxepin  3 mg. The 10 mg is a capsule.  Does she want to try trazodone?

## 2023-11-09 NOTE — Addendum Note (Signed)
 Addended by: LAVELL LYE A on: 11/09/2023 03:35 PM   Modules accepted: Orders

## 2023-11-09 NOTE — Telephone Encounter (Signed)
 ok

## 2023-11-09 NOTE — Telephone Encounter (Signed)
 Patient aware go back to the other dosage

## 2023-11-14 ENCOUNTER — Ambulatory Visit: Payer: Self-pay

## 2023-11-14 ENCOUNTER — Ambulatory Visit: Admitting: Internal Medicine

## 2023-11-14 ENCOUNTER — Other Ambulatory Visit (HOSPITAL_COMMUNITY)

## 2023-11-14 NOTE — Telephone Encounter (Signed)
 Noted

## 2023-11-14 NOTE — Telephone Encounter (Signed)
 FYI Only or Action Required?: FYI only for provider.  Patient was last seen in primary care on 10/23/2023 by Lavell Bari LABOR, FNP.  Called Nurse Triage reporting Nasal Congestion.  Symptoms began several weeks ago.  Interventions attempted: OTC medications: Allegra.  Symptoms are: unchanged.  Triage Disposition: See PCP When Office is Open (Within 3 Days)  Patient/caregiver understands and will follow disposition?: Yes  ** Appt. Scheduled for 10/2 **        Copied from CRM #[id number]. Topic: Clinical - Red Word Triage >> Nov 14, 2023  2:55 PM Turkey B wrote: [facility] that prompted transfer to Nurse Triage: patient is choking on mucus and has runny nose , she is asking for steroid nasal spray/otc' int working Reason for Disposition  [1] Nasal discharge AND [2] present > 10 days  Answer Assessment - Initial Assessment Questions 1. ONSET: When did the nasal discharge start?      Sept 7th is when she had Covid, mucus (Clear-white)collects in back of throat, runny nose has persisted   2. AMOUNT: How much discharge is there?      Patient states a lot   3. COUGH: Do you have a cough? If Yes, ask: Describe the color of your mucus. (e.g., clear, white, yellow, green)     No cough   4. RESPIRATORY DISTRESS: Describe your breathing.      No   5. FEVER: Do you have a fever? If Yes, ask: What is your temperature, how was it measured, and when did it start?     No   6. SEVERITY: Overall, how bad are you feeling right now? (e.g., doesn't interfere with normal activities, staying home from school/work, staying in bed)      Patient stated symptoms are just getting annoying as they have persisted since 10/21/23   7. OTHER SYMPTOMS: Do you have any other symptoms? (e.g., earache, mouth sores, sore throat, wheezing)  No   Allegra was working, not relieving symptoms, Allergy appt is scheduled for the end of month. Appt. Scheduled for 10/2 for evaluation.  Protocols  used: Common Cold-A-AH

## 2023-11-15 ENCOUNTER — Ambulatory Visit: Admitting: Family Medicine

## 2023-12-05 ENCOUNTER — Ambulatory Visit: Admitting: Urology

## 2023-12-10 ENCOUNTER — Telehealth: Payer: Self-pay | Admitting: Family Medicine

## 2023-12-10 NOTE — Telephone Encounter (Signed)
 Copied from CRM [id number]. Topic: Medical Record Request - Other >> Dec 10, 2023  9:47 AM Diannia H wrote: Reason for CRM: Patient called and is needing someone to send over to [employer] the diagnosis and if she qualifies for the medicare dual complete program based on her diagnosis. Could you assist? Patients callback [phone].

## 2023-12-10 NOTE — Telephone Encounter (Signed)
 Pt is going to call [facility] to make sure of the information. She will collect to fax or mailing address.

## 2023-12-12 ENCOUNTER — Other Ambulatory Visit: Payer: Self-pay

## 2023-12-12 ENCOUNTER — Ambulatory Visit: Admitting: Allergy & Immunology

## 2023-12-12 ENCOUNTER — Encounter: Payer: Self-pay | Admitting: Allergy & Immunology

## 2023-12-12 VITALS — BP 130/80 | HR 69 | Temp 97.5°F | Resp 18 | Ht 62.6 in | Wt 172.4 lb

## 2023-12-12 DIAGNOSIS — T7840XD Allergy, unspecified, subsequent encounter: Secondary | ICD-10-CM | POA: Diagnosis not present

## 2023-12-12 DIAGNOSIS — J4520 Mild intermittent asthma, uncomplicated: Secondary | ICD-10-CM

## 2023-12-12 DIAGNOSIS — H9193 Unspecified hearing loss, bilateral: Secondary | ICD-10-CM

## 2023-12-12 DIAGNOSIS — H938X3 Other specified disorders of ear, bilateral: Secondary | ICD-10-CM

## 2023-12-12 DIAGNOSIS — J310 Chronic rhinitis: Secondary | ICD-10-CM

## 2023-12-12 DIAGNOSIS — T7840XA Allergy, unspecified, initial encounter: Secondary | ICD-10-CM
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
## 2023-12-12 NOTE — Patient Instructions (Addendum)
 1. Allergic reaction - with concern for a reaction to rosuvastatin  - I am not convinced that this reaction was related to the statin medication since it happened after several days of starting it.  - I would have thought that you would have reacted sooner in the course if you were going to react to it.  - I would recommend scheduling a challenge to the new medication here in the office. - I am going to talk to your Cardiologist about changing you back to atorvastatin  since you had been on that for a long time without a problem. - We are going to get some labs to look for weird causes of allergic reactions, including mast cell abnormalities.  - We will call you in 1-2 weeks with the results of the testing.   2. Chronic rhinitis - Because of insurance stipulations, we cannot do skin testing on the same day as your first visit. - We are all working to fight this, but for now we need to do two separate visits.  - We will know more after we do testing at the next visit.  - The skin testing visit can be squeezed in at your convenience.  - Then we can make a more full plan to address all of your symptoms. - Be sure to stop your antihistamines for 3 days before this appointment.   3. Intermittent asthma, uncomplicated  - Lung testing looks good today. - We are not going to add anything since you are not using your albuterol  on a regular basis.    4. Ear fullness - We are referring you to see the [facility] ENT group. - They should call you in a week or so to schedule you.  5. Return in about 1 week (around 12/19/2023) for ALLERGY TESTING (1-55). You can have the follow up appointment with Dr. Iva or a Nurse Practicioner (our Nurse Practitioners are excellent and always have Physician oversight!).    Please inform us  of any Emergency Department visits, hospitalizations, or changes in symptoms. Call us  before going to the ED for breathing or allergy symptoms since we might be able to fit you in for a  sick visit. Feel free to contact us  anytime with any questions, problems, or concerns.  It was a pleasure to meet you today!  Websites that have reliable patient information: 1. American Academy of Asthma, Allergy, and Immunology: www.aaaai.org 2. Food Allergy Research and Education (FARE): foodallergy.org 3. Mothers of Asthmatics: http://www.asthmacommunitynetwork.org 4. American College of Allergy, Asthma, and Immunology: www.acaai.org      "Like" us  on Facebook and Instagram for our latest updates!      A healthy democracy works best when [employer] participate! Make sure you are registered to vote! If you have moved or changed any of your contact information, you will need to get this updated before voting! Scan the QR codes below to learn more!

## 2023-12-14 ENCOUNTER — Encounter (INDEPENDENT_AMBULATORY_CARE_PROVIDER_SITE_OTHER): Payer: Self-pay

## 2023-12-14 LAB — HYMENOPTERA VENOM ALLERGY II

## 2023-12-16 LAB — HYMENOPTERA VENOM ALLERGY II
Bumblebee: <0.1 kU/L
Hornet, White Face, IgE: <0.1 kU/L
Hornet, Yellow, IgE: <0.1 kU/L
I001-IgE Honeybee: <0.1 kU/L
I208-IgE Api m 1: <0.1 kU/L
I209-IgE Ves v 5: <0.1 kU/L
I211-IgE Ves v 1: <0.1 kU/L
I211-IgE Ves v 1: <0.1 kU/L
I214-IgE Api m 2: <0.1 kU/L
I215-IgE Api m 3: <0.1 kU/L
I216-IgE Api m 5: <0.1 kU/L
I217-IgE Api m 10: <0.1 kU/L
Reflex Information: <0.1 kU/L
Reflex Information: <0.1 kU/L
Tryptase: 8.2 ug/L (ref 2.2–13.2)

## 2023-12-16 LAB — ALPHA-GAL PANEL
Allergen Lamb IgE: <0.1 kU/L
Beef IgE: <0.1 kU/L
IgE (Immunoglobulin E), Serum: 9 [IU]/mL (ref 6–495)
O215-IgE Alpha-Gal: 0.22 kU/L — AB
Pork IgE: <0.1 kU/L

## 2023-12-16 LAB — ALLERGEN COMPONENT COMMENTS

## 2023-12-17 ENCOUNTER — Encounter: Payer: Self-pay | Admitting: Radiology

## 2023-12-18 ENCOUNTER — Telehealth: Payer: Self-pay | Admitting: Family

## 2023-12-18 NOTE — Telephone Encounter (Signed)
 Called and left a message please ask patient who prescribed this?

## 2023-12-18 NOTE — Telephone Encounter (Signed)
 Can you call and see who has prescribed this and why is it not on medication list?

## 2023-12-18 NOTE — Telephone Encounter (Signed)
 Copied from CRM [id number]. Topic: Clinical - Medication Refill >> Dec 18, 2023  8:44 AM Ahlexyia S wrote: Medication: pregabalin (LYRICA) 25 MG capsule  Has the patient contacted their pharmacy? Yes, pt was advised to contact clinic. (Agent: If no, request that the patient contact the pharmacy for the refill. If patient does not wish to contact the pharmacy document the reason why and proceed with request.) (Agent: If yes, when and what did the pharmacy advise?)  This is the patient's preferred pharmacy:  [facility] [phone] - [place], [place] - [address] AT [facility] [address] [place] [address] Phone: [phone] Fax: [phone]  Is this the correct pharmacy for this prescription? Yes If no, delete pharmacy and type the correct one.   Has the prescription been filled recently? No, pt isnt previously on this medication but is stating that she needs it again.  Is the patient out of the medication? Yes  Has the patient been seen for an appointment in the last year OR does the patient have an upcoming appointment? Yes  Can we respond through MyChart? Yes  Agent: Please be advised that Rx refills may take up to 3 business days. We ask that you follow-up with your pharmacy.

## 2023-12-18 NOTE — Telephone Encounter (Signed)
>>   Dec 18, 2023  8:44 AM [name] [name] wrote: Medication: pregabalin (LYRICA) 25 MG capsule   Has the patient contacted their pharmacy? Yes, pt was advised to contact clinic. (Agent: If no, request that the patient contact the pharmacy for the refill. If patient does not wish to contact the pharmacy document the reason why and proceed with request.) (Agent: If yes, when and what did the pharmacy advise?)   This is the patient'[name] preferred pharmacy:  [facility] [phone] - [place], [place] - [address] AT [facility] [address] [place] [address] [place] [address] Phone: [phone] Fax: [phone]

## 2023-12-19 ENCOUNTER — Ambulatory Visit: Admitting: Allergy & Immunology

## 2023-12-19 NOTE — Telephone Encounter (Unsigned)
 Copied from CRM #[id number]. Topic: General - Other >> Dec 18, 2023  4:46 PM Debby BROCKS wrote: Reason for CRM: Patient missed a call from the CMA and wanted to advise that she does not remember the doctors name that prescribed for her pregabalin (LYRICA) 25 MG capsule. All she remembers is that it was a [employer] at [facility] and they Gave enough for 6 months. Reason she did not go back to the doctor is due to her stating is not a spine issue even thought the medication is what she needed

## 2023-12-20 ENCOUNTER — Telehealth: Payer: Self-pay

## 2023-12-20 MED ORDER — PREGABALIN 25 MG PO CAPS: 25.0000 mg | ORAL_CAPSULE | Freq: Three times a day (TID) | ORAL | 0 refills | Status: AC | PRN

## 2023-12-20 NOTE — Addendum Note (Signed)
 Addended by: LAVELL LYE A on: 12/20/2023 01:41 PM   Modules accepted: Orders

## 2023-12-20 NOTE — Telephone Encounter (Signed)
 Refill sent.

## 2023-12-20 NOTE — Telephone Encounter (Signed)
 Prescription sent to pharmacy.

## 2023-12-20 NOTE — Telephone Encounter (Signed)
 Patient left msg on vm wanting to schedule an appointment. I returned her call and had to leave a msg for her to call the office.

## 2023-12-21 NOTE — Telephone Encounter (Signed)
 Pt is asking for an xray of shoulder. The rx sent in is working some. Pt says that her ortho apt is not until 12/31/2023. She does not want to wait any longer due to the on going pain. Please call back

## 2023-12-24 NOTE — Telephone Encounter (Signed)
 CALLED TO MAKE PATIENT APPT WITH [facility] WHO IS DOD TODAY

## 2023-12-24 NOTE — Telephone Encounter (Signed)
 Spoke with [name] [name] offered her appt with Bari for today she declined said she has appt with Ortho tomorrow and will wait until then. [name] [name] wants to let Bari know that she is grateful for the pregablin that was called in for her it did help with the pain

## 2023-12-24 NOTE — Telephone Encounter (Signed)
 Please make pt an appointment for today with me.

## 2023-12-25 ENCOUNTER — Ambulatory Visit: Payer: Self-pay | Admitting: Allergy & Immunology

## 2023-12-25 ENCOUNTER — Encounter: Payer: Self-pay | Admitting: Allergy & Immunology

## 2023-12-27 ENCOUNTER — Ambulatory Visit: Payer: Self-pay | Admitting: Family

## 2023-12-28 ENCOUNTER — Encounter: Admitting: Family Medicine

## 2023-12-31 ENCOUNTER — Ambulatory Visit: Admitting: Orthopedic Surgery

## 2023-12-31 ENCOUNTER — Institutional Professional Consult (permissible substitution) (INDEPENDENT_AMBULATORY_CARE_PROVIDER_SITE_OTHER)

## 2024-01-01 ENCOUNTER — Institutional Professional Consult (permissible substitution) (INDEPENDENT_AMBULATORY_CARE_PROVIDER_SITE_OTHER): Admitting: Physician Assistant

## 2024-01-08 ENCOUNTER — Other Ambulatory Visit (HOSPITAL_COMMUNITY): Payer: Self-pay | Admitting: Family

## 2024-01-08 DIAGNOSIS — Z1231 Encounter for screening mammogram for malignant neoplasm of breast: Secondary | ICD-10-CM

## 2024-01-15 ENCOUNTER — Ambulatory Visit: Payer: Self-pay | Admitting: Family

## 2024-01-15 ENCOUNTER — Ambulatory Visit: Admitting: Family

## 2024-01-15 VITALS — BP 121/59 | HR 79 | Temp 97.3°F | Ht 62.0 in | Wt 171.4 lb

## 2024-01-15 DIAGNOSIS — R350 Frequency of micturition: Secondary | ICD-10-CM

## 2024-01-15 DIAGNOSIS — F321 Major depressive disorder, single episode, moderate: Secondary | ICD-10-CM

## 2024-01-15 DIAGNOSIS — N390 Urinary tract infection, site not specified: Secondary | ICD-10-CM

## 2024-01-15 DIAGNOSIS — L309 Dermatitis, unspecified: Secondary | ICD-10-CM

## 2024-01-15 DIAGNOSIS — N952 Postmenopausal atrophic vaginitis: Secondary | ICD-10-CM

## 2024-01-15 LAB — URINALYSIS, COMPLETE
Bilirubin, UA: NEGATIVE
Glucose, UA: NEGATIVE
Ketones, UA: NEGATIVE
Leukocytes,UA: NEGATIVE
Nitrite, UA: NEGATIVE
Protein,UA: NEGATIVE
Specific Gravity, UA: 1.02 (ref 1.005–1.030)
Urobilinogen, Ur: 0.2 mg/dL (ref 0.2–1.0)
pH, UA: 5.5 (ref 5.0–7.5)

## 2024-01-15 MED ORDER — DULOXETINE HCL 30 MG PO CPEP: 30.0000 mg | ORAL_CAPSULE | Freq: Every day | ORAL | 1 refills | Status: DC

## 2024-01-15 MED ORDER — TRIAMCINOLONE ACETONIDE 0.1 % EX CREA: 1.0000 | TOPICAL_CREAM | Freq: Two times a day (BID) | CUTANEOUS | 1 refills | Status: DC

## 2024-01-15 MED ORDER — ESTROGENS CONJUGATED 0.625 MG/GM VA CREA: TOPICAL_CREAM | VAGINAL | 3 refills | Status: AC

## 2024-01-15 NOTE — Progress Notes (Signed)
 Subjective:    Patient ID: [name] [name], female    DOB: 11-06-1950, 73 y.o.   MRN: [id number]  Chief Complaint  Patient presents with   Depression   Urinary Tract Infection   Rash    Under bra line times 1 mth    PT presents to the office today to discuss depression. Reports she only wants to stay on the couch and do nothing. She reports she has had depression in the past, but was able to exercise and feel better. However, states her body won't let her move like she use to. She has taken Cymbalta  in the past that worked well.   She is also complaining of  Depression        This is a recurrent problem.  The current episode started more than 1 month ago.   Associated symptoms include fatigue, helplessness, hopelessness, decreased interest and sad.  Associated symptoms include does not have insomnia.  Past treatments include nothing. Rash This is a new problem. The current episode started more than 1 month ago. The problem is unchanged. Location: in between breast. The rash is characterized by itchiness. She was exposed to nothing. Associated symptoms include fatigue. Pertinent negatives include no vomiting.  Urinary Frequency  This is a new problem. The current episode started 1 to 4 weeks ago. The problem occurs intermittently. The problem has been waxing and waning. The patient is experiencing no pain. Associated symptoms include frequency, nausea and urgency. Pertinent negatives include no hematuria, hesitancy or vomiting. She has tried increased fluids for the symptoms.      Review of Systems  Constitutional:  Positive for fatigue.  Gastrointestinal:  Positive for nausea. Negative for vomiting.  Genitourinary:  Positive for frequency and urgency. Negative for hematuria and hesitancy.  Skin:  Positive for rash.  Psychiatric/Behavioral:  Positive for depression. The patient does not have insomnia.   All other systems reviewed and are negative.   Social History   Socioeconomic  History   Marital status: Divorced    Spouse name: single   Number of children: 1   Years of education: Not on file   Highest education level: Associate degree: occupational, scientist, product/process development, or vocational program  Occupational History   Occupation: Retired   Tobacco Use   Smoking status: Former    Current packs/day: 0.00    Average packs/day: 0.5 packs/day for 15.0 years (7.5 ttl pk-yrs)    Types: Cigarettes    Start date: 05/23/1991    Quit date: 05/23/2006    Years since quitting: 17.6   Smokeless tobacco: Never  Vaping Use   Vaping status: Never Used  Substance and Sexual Activity   Alcohol  use: Yes    Alcohol /week: 0.0 standard drinks of alcohol     Comment: rare glass of wine   Drug use: No   Sexual activity: Not Currently    Birth control/protection: Post-menopausal  Other Topics Concern   Not on file  Social History Narrative   Diet:  Fish and veggies - does not eat a lot of red meat   Do you drink/eat things with caffeine?  2 cups a day   Marital status:  Divorced   Do you live in a house, apartment, assisted living, condo, trailer, etc.)? Renting a one-story house   Is it one or more stories?  One   How many persons live in your home?  Lives alone   Do you have any pets in your home? (please list)  One dog  Education:  Some college   Current or past profession:  CNA   Do you exercise:  Yes - Walks 2-3 times a week.      ADVANCED DIRECTIVES  None.  Does want CPR.       FUNCTIONAL STATUS   Has no difficulties with ADLs.   Social Drivers of Health   Financial Resource Strain: Medium Risk (01/15/2024)   Overall Financial Resource Strain (CARDIA)    Difficulty of Paying Living Expenses: Somewhat hard  Food Insecurity: Food Insecurity Present (01/15/2024)   Hunger Vital Sign    Worried About Running Out of Food in the Last Year: Sometimes true    Ran Out of Food in the Last Year: Never true  Transportation Needs: No Transportation Needs (01/15/2024)   PRAPARE -  Administrator, Civil Service (Medical): No    Lack of Transportation (Non-Medical): No  Physical Activity: Inactive (01/15/2024)   Exercise Vital Sign    Days of Exercise per Week: 0 days    Minutes of Exercise per Session: Not on file  Stress: Patient Declined (01/15/2024)   [employer] of Occupational Health - Occupational Stress Questionnaire    Feeling of Stress: Patient declined  Recent Concern: Stress - Stress Concern Present (10/22/2023)   [employer] of Occupational Health - Occupational Stress Questionnaire    Feeling of Stress: Very much  Social Connections: Unknown (01/15/2024)   Social Connection and Isolation Panel    Frequency of Communication with Friends and Family: Not on file    Frequency of Social Gatherings with Friends and Family: Not on file    Attends Religious Services: Not on file    Active Member of Clubs or Organizations: No    Attends Banker Meetings: Not on file    Marital Status: Not on file  Recent Concern: Social Connections - Socially Isolated (10/22/2023)   Social Connection and Isolation Panel    Frequency of Communication with Friends and Family: Never    Frequency of Social Gatherings with Friends and Family: Never    Attends Religious Services: 1 to 4 times per year    Active Member of [employer] or Organizations: No    Attends Engineer, Structural: Not on file    Marital Status: Divorced   Family History  Problem Relation Age of Onset   Mental illness Mother    Alcohol  abuse Mother    Drug abuse Sister    Cancer Brother        head and neck cancer   Alcohol  abuse Sister    Pancreatitis Sister    Hypertension Daughter    Fibroids Daughter    Lung cancer Maternal Grandmother    Heart disease Maternal Grandfather    Colon cancer Neg Hx    Breast cancer Neg Hx    Esophageal cancer Neg Hx    Rectal cancer Neg Hx    Stomach cancer Neg Hx         Objective:   Physical Exam Vitals reviewed.   Constitutional:      General: She is not in acute distress.    Appearance: She is well-developed.  HENT:     Head: Normocephalic and atraumatic.  Eyes:     Pupils: Pupils are equal, round, and reactive to light.  Neck:     Thyroid : No thyromegaly.  Cardiovascular:     Rate and Rhythm: Normal rate and regular rhythm.     Heart sounds: Normal heart sounds. No murmur heard.  Pulmonary:     Effort: Pulmonary effort is normal. No respiratory distress.     Breath sounds: Normal breath sounds. No wheezing.  Abdominal:     General: Bowel sounds are normal. There is no distension.     Palpations: Abdomen is soft.     Tenderness: There is no abdominal tenderness.  Musculoskeletal:        General: No tenderness. Normal range of motion.     Cervical back: Normal range of motion and neck supple.  Skin:    General: Skin is warm and dry.     Findings: Erythema and rash present.         Comments: Papule rash in midline of chest  Neurological:     Mental Status: She is alert and oriented to person, place, and time.     Cranial Nerves: No cranial nerve deficit.     Deep Tendon Reflexes: Reflexes are normal and symmetric.  Psychiatric:        Behavior: Behavior normal.        Thought Content: Thought content normal.        Judgment: Judgment normal.       BP (!) 121/59   Pulse 79   Temp (!) 97.3 F (36.3 C) (Temporal)   Ht 5' 2 (1.575 m)   Wt 171 lb 6.4 oz (77.7 kg)   SpO2 96%   BMI 31.35 kg/m      Assessment & Plan:  [name] [name] comes in today with chief complaint of Depression, Urinary Tract Infection, and Rash (Under bra line times 1 mth )   Diagnosis and orders addressed:  1. Urinary frequency (Primary) Urine negative Limit caffeine  - Urinalysis, Complete - Urine Culture  2. Depression, major, single episode, moderate (HCC) Start Cymbalta  30 mg Stress management  Follow up in one month to recheck  - DULoxetine  (CYMBALTA ) 30 MG capsule; Take 1 capsule (30 mg  total) by mouth daily.  Dispense: 90 capsule; Refill: 1  3. Dermatitis Start kenalog BID  Avoid scratching  Keep clean and dry - triamcinolone cream (KENALOG) 0.1 %; Apply 1 Application topically 2 (two) times daily.  Dispense: 453.6 g; Refill: 1     Bari Learn, FNP

## 2024-01-15 NOTE — Patient Instructions (Signed)

## 2024-01-16 ENCOUNTER — Ambulatory Visit: Admitting: Cardiology

## 2024-01-16 ENCOUNTER — Encounter (HOSPITAL_COMMUNITY)

## 2024-01-16 DIAGNOSIS — Z1231 Encounter for screening mammogram for malignant neoplasm of breast: Secondary | ICD-10-CM

## 2024-01-21 ENCOUNTER — Encounter (HOSPITAL_COMMUNITY): Payer: Self-pay

## 2024-01-21 ENCOUNTER — Ambulatory Visit (HOSPITAL_COMMUNITY)

## 2024-01-21 LAB — URINE CULTURE

## 2024-01-23 ENCOUNTER — Encounter (INDEPENDENT_AMBULATORY_CARE_PROVIDER_SITE_OTHER): Payer: Self-pay | Admitting: Physician Assistant

## 2024-01-23 ENCOUNTER — Ambulatory Visit (INDEPENDENT_AMBULATORY_CARE_PROVIDER_SITE_OTHER): Admitting: Physician Assistant

## 2024-01-23 VITALS — BP 159/67 | HR 63 | Ht 63.0 in | Wt 167.0 lb

## 2024-01-23 DIAGNOSIS — J309 Allergic rhinitis, unspecified: Secondary | ICD-10-CM

## 2024-01-23 DIAGNOSIS — H6993 Unspecified Eustachian tube disorder, bilateral: Secondary | ICD-10-CM

## 2024-01-23 DIAGNOSIS — H6990 Unspecified Eustachian tube disorder, unspecified ear: Secondary | ICD-10-CM

## 2024-01-23 MED ORDER — FLUTICASONE PROPIONATE 50 MCG/ACT NA SUSP: 2.0000 | Freq: Every day | NASAL | 6 refills | Status: AC

## 2024-01-23 NOTE — Progress Notes (Signed)
 Patient is having BP managed. Patient took BP medication this morning.
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
## 2024-01-31 ENCOUNTER — Ambulatory Visit: Admitting: Family

## 2024-02-23 ENCOUNTER — Other Ambulatory Visit (INDEPENDENT_AMBULATORY_CARE_PROVIDER_SITE_OTHER): Payer: Self-pay | Admitting: Gastroenterology

## 2024-02-23 DIAGNOSIS — K219 Gastro-esophageal reflux disease without esophagitis: Secondary | ICD-10-CM

## 2024-02-25 ENCOUNTER — Ambulatory Visit: Admitting: Family

## 2024-02-25 ENCOUNTER — Encounter: Payer: Self-pay | Admitting: Family

## 2024-02-25 VITALS — BP 137/65 | HR 59 | Temp 97.8°F | Ht 63.0 in | Wt 171.0 lb

## 2024-02-25 DIAGNOSIS — F419 Anxiety disorder, unspecified: Secondary | ICD-10-CM

## 2024-02-25 DIAGNOSIS — F321 Major depressive disorder, single episode, moderate: Secondary | ICD-10-CM | POA: Insufficient documentation

## 2024-02-25 DIAGNOSIS — K219 Gastro-esophageal reflux disease without esophagitis: Secondary | ICD-10-CM

## 2024-02-25 DIAGNOSIS — L239 Allergic contact dermatitis, unspecified cause: Secondary | ICD-10-CM

## 2024-02-25 DIAGNOSIS — Z951 Presence of aortocoronary bypass graft: Secondary | ICD-10-CM

## 2024-02-25 DIAGNOSIS — E559 Vitamin D deficiency, unspecified: Secondary | ICD-10-CM

## 2024-02-25 DIAGNOSIS — F339 Major depressive disorder, recurrent, unspecified: Secondary | ICD-10-CM

## 2024-02-25 DIAGNOSIS — I10 Essential (primary) hypertension: Secondary | ICD-10-CM | POA: Diagnosis not present

## 2024-02-25 DIAGNOSIS — Z1211 Encounter for screening for malignant neoplasm of colon: Secondary | ICD-10-CM

## 2024-02-25 DIAGNOSIS — E785 Hyperlipidemia, unspecified: Secondary | ICD-10-CM | POA: Diagnosis not present

## 2024-02-25 DIAGNOSIS — E611 Iron deficiency: Secondary | ICD-10-CM | POA: Diagnosis not present

## 2024-02-25 DIAGNOSIS — I2510 Atherosclerotic heart disease of native coronary artery without angina pectoris: Secondary | ICD-10-CM

## 2024-02-25 MED ORDER — METHYLPREDNISOLONE 4 MG PO TBPK: ORAL_TABLET | ORAL | 0 refills | Status: AC

## 2024-02-25 MED ORDER — PANTOPRAZOLE SODIUM 20 MG PO TBEC: 20.0000 mg | DELAYED_RELEASE_TABLET | Freq: Every day | ORAL | 1 refills | Status: AC

## 2024-02-25 MED ORDER — PAROXETINE HCL ER 12.5 MG PO TB24: 12.5000 mg | ORAL_TABLET | Freq: Every day | ORAL | 1 refills | Status: AC

## 2024-02-25 MED ORDER — TRIAMCINOLONE ACETONIDE 0.1 % EX CREA: 1.0000 | TOPICAL_CREAM | Freq: Two times a day (BID) | CUTANEOUS | 1 refills | Status: AC

## 2024-02-25 NOTE — Progress Notes (Signed)
 "  Subjective:    Patient ID: [name] [name], female    DOB: 19-Apr-1950, 74 y.o.   MRN: [id number]  Chief Complaint  Patient presents with   Follow-up   Rash    HAS OTC CREAM ON ARMS LEGS AND BELLY    PT presents to the office today for chronic follow up.    She is followed by Cardiologists every 6 months for CAD and hx of CABG.  Hypertension This is a chronic problem. The current episode started more than 1 year ago. The problem has been resolved since onset. The problem is controlled. Associated symptoms include malaise/fatigue. Pertinent negatives include no peripheral edema. Risk factors for coronary artery disease include obesity, dyslipidemia and sedentary lifestyle. The current treatment provides moderate improvement.  Hyperlipidemia This is a chronic problem. The problem is controlled. Recent lipid tests were reviewed and are normal. Exacerbating diseases include obesity. Current antihyperlipidemic treatment includes statins. The current treatment provides moderate improvement of lipids. Risk factors for coronary artery disease include hypertension, dyslipidemia, a sedentary lifestyle, post-menopausal and obesity.  Gastroesophageal Reflux She complains of belching. This is a chronic problem. The current episode started more than 1 year ago. The problem occurs occasionally. The problem has been resolved. The symptoms are aggravated by certain foods. Associated symptoms include fatigue. She has tried a PPI for the symptoms. The treatment provided moderate relief.  Insomnia Primary symptoms: sleep disturbance, difficulty falling asleep, frequent awakening, malaise/fatigue.   The current episode started more than one year. The onset quality is gradual. The problem occurs intermittently. The symptoms are relieved by medication. The treatment provided mild relief. PMH includes: depression.   Rash This is a new problem. The current episode started more than 1 month ago. The problem has been  waxing and waning since onset. The affected locations include the left arm, abdomen, right arm and left lower leg. The rash is characterized by itchiness and redness. Associated symptoms include fatigue. Past treatments include anti-itch cream. The treatment provided mild relief.  Depression        This is a chronic problem.  Associated symptoms include fatigue, helplessness, hopelessness, restlessness and sad.  Past treatments include nothing.     Review of Systems  Constitutional:  Positive for fatigue and malaise/fatigue.  Skin:  Positive for rash.  Psychiatric/Behavioral:  Positive for depression and sleep disturbance.   All other systems reviewed and are negative.   Social History   Socioeconomic History   Marital status: Divorced    Spouse name: single   Number of children: 1   Years of education: Not on file   Highest education level: Associate degree: occupational, scientist, product/process development, or vocational program  Occupational History   Occupation: Retired   Tobacco Use   Smoking status: Former    Current packs/day: 0.00    Average packs/day: 0.5 packs/day for 15.0 years (7.5 ttl pk-yrs)    Types: Cigarettes    Start date: 05/23/1991    Quit date: 05/23/2006    Years since quitting: 17.7   Smokeless tobacco: Never  Vaping Use   Vaping status: Never Used  Substance and Sexual Activity   Alcohol  use: Yes    Alcohol /week: 0.0 standard drinks of alcohol     Comment: rare glass of wine   Drug use: No   Sexual activity: Not Currently    Birth control/protection: Post-menopausal  Other Topics Concern   Not on file  Social History Narrative   Diet:  Fish and veggies - does not eat a  lot of red meat   Do you drink/eat things with caffeine?  2 cups a day   Marital status:  Divorced   Do you live in a house, apartment, assisted living, condo, trailer, etc.)? Renting a one-story house   Is it one or more stories?  One   How many persons live in your home?  Lives alone   Do you have any pets  in your home? (please list)  One dog   Education:  Some college   Current or past profession:  CNA   Do you exercise:  Yes - Walks 2-3 times a week.      ADVANCED DIRECTIVES  None.  Does want CPR.       FUNCTIONAL STATUS   Has no difficulties with ADLs.   Social Drivers of Health   Tobacco Use: Medium Risk (02/25/2024)   Patient History    Smoking Tobacco Use: Former    Smokeless Tobacco Use: Never    Passive Exposure: Not on file  Financial Resource Strain: Medium Risk (01/15/2024)   Overall Financial Resource Strain (CARDIA)    Difficulty of Paying Living Expenses: Somewhat hard  Food Insecurity: Food Insecurity Present (01/15/2024)   Epic    Worried About Programme Researcher, Broadcasting/film/video in the Last Year: Sometimes true    Ran Out of Food in the Last Year: Never true  Transportation Needs: No Transportation Needs (01/15/2024)   Epic    Lack of Transportation (Medical): No    Lack of Transportation (Non-Medical): No  Physical Activity: Inactive (01/15/2024)   Exercise Vital Sign    Days of Exercise per Week: 0 days    Minutes of Exercise per Session: Not on file  Stress: Patient Declined (01/15/2024)   [employer] of Occupational Health - Occupational Stress Questionnaire    Feeling of Stress: Patient declined  Recent Concern: Stress - Stress Concern Present (10/22/2023)   [employer] of Occupational Health - Occupational Stress Questionnaire    Feeling of Stress: Very much  Social Connections: Unknown (01/15/2024)   Social Connection and Isolation Panel    Frequency of Communication with Friends and Family: Not on file    Frequency of Social Gatherings with Friends and Family: Not on file    Attends Religious Services: Not on file    Active Member of Clubs or Organizations: No    Attends Banker Meetings: Not on file    Marital Status: Not on file  Recent Concern: Social Connections - Socially Isolated (10/22/2023)   Social Connection and Isolation Panel     Frequency of Communication with Friends and Family: Never    Frequency of Social Gatherings with Friends and Family: Never    Attends Religious Services: 1 to 4 times per year    Active Member of [employer] or Organizations: No    Attends Engineer, Structural: Not on file    Marital Status: Divorced  Depression (PHQ2-9): High Risk (01/15/2024)   Depression (PHQ2-9)    PHQ-2 Score: 14  Alcohol  Screen: Not on file  Housing: Unknown (01/15/2024)   Epic    Unable to Pay for Housing in the Last Year: No    Number of Times Moved in the Last Year: Not on file    Homeless in the Last Year: No  Utilities: Not on file  Health Literacy: Not on file   Family History  Problem Relation Age of Onset   Mental illness Mother    Alcohol  abuse Mother  Drug abuse Sister    Cancer Brother        head and neck cancer   Alcohol  abuse Sister    Pancreatitis Sister    Hypertension Daughter    Fibroids Daughter    Lung cancer Maternal Grandmother    Heart disease Maternal Grandfather    Colon cancer Neg Hx    Breast cancer Neg Hx    Esophageal cancer Neg Hx    Rectal cancer Neg Hx    Stomach cancer Neg Hx         Objective:   Physical Exam Vitals reviewed.  Constitutional:      General: She is not in acute distress.    Appearance: She is well-developed.  HENT:     Head: Normocephalic and atraumatic.     Right Ear: Tympanic membrane normal.     Left Ear: Tympanic membrane normal.  Eyes:     Pupils: Pupils are equal, round, and reactive to light.  Neck:     Thyroid : No thyromegaly.  Cardiovascular:     Rate and Rhythm: Normal rate and regular rhythm.     Heart sounds: Normal heart sounds. No murmur heard. Pulmonary:     Effort: Pulmonary effort is normal. No respiratory distress.     Breath sounds: Wheezing present.     Comments: Dry nonproductive cough Abdominal:     General: Bowel sounds are normal. There is no distension.     Palpations: Abdomen is soft.     Tenderness:  There is no abdominal tenderness.  Musculoskeletal:        General: No tenderness. Normal range of motion.     Cervical back: Normal range of motion and neck supple.  Skin:    General: Skin is warm and dry.     Findings: Erythema and rash present.         Comments: Hive like rash on bilateral forearm. Dry rash on left lower leg  Neurological:     Mental Status: She is alert and oriented to person, place, and time.     Cranial Nerves: No cranial nerve deficit.     Deep Tendon Reflexes: Reflexes are normal and symmetric.  Psychiatric:        Behavior: Behavior normal.        Thought Content: Thought content normal.        Judgment: Judgment normal.       BP 137/65   Pulse (!) 59   Temp 97.8 F (36.6 C) (Temporal)   Ht 5' 3 (1.6 m)   Wt 171 lb (77.6 kg)   BMI 30.29 kg/m      Assessment & Plan:  RUHEE ENCK comes in today with chief complaint of Follow-up and Rash (HAS OTC CREAM ON ARMS LEGS AND BELLY )   Diagnosis and orders addressed: 1. Anxiety Start paxil    2. Coronary artery disease involving native heart without angina pectoris, unspecified vessel or lesion type Keep follow up with Cardiologist  Continue Lipitor - Ambulatory referral to Cardiology  3. Essential hypertension - Ambulatory referral to Cardiology  4. Gastroesophageal reflux disease without esophagitis Restart Protonix  20 mg -Diet discussed- Avoid fried, spicy, citrus foods, caffeine and alcohol  -Do not eat 2-3 hours before bedtime -Encouraged small frequent meals -Avoid NSAID's   5. Hyperlipidemia LDL goal <70 Continue Lipitor Low fat diet   6. Major depression, recurrent, chronic (Primary) Start Paxil  12.5 mg  Stress management  Follow up in 1 month - PARoxetine  (PAXIL  CR) 12.5  MG 24 hr tablet; Take 1 tablet (12.5 mg total) by mouth daily.  Dispense: 90 tablet; Refill: 1  7. Vitamin D deficiency  8. S/P CABG x 2 Follow up with Cardiologists   9. Iron  deficiency Iron  rich  diet  10. Allergic dermatitis Start medrol  and kenalog  as needed - methylPREDNISolone  (MEDROL  DOSEPAK) 4 MG TBPK tablet; Take 6 on day 1, 5 on day 2, 4 on day 3, 3 on Day 4, 2 on day 5, 1 on day 6  Dispense: 21 tablet; Refill: 0 - triamcinolone  cream (KENALOG ) 0.1 %; Apply 1 Application topically 2 (two) times daily.  Dispense: 453.6 g; Refill: 1  11. Colon cancer screening - Ambulatory referral to Gastroenterology   Labs reviewed from last visit Start Paxil  12.5 mg  Restart Protonix  20 mg Keep follow up with specialists  Health Maintenance reviewed Diet and exercise encouraged  Return in about 1 month (around 03/27/2024), or if symptoms worsen or fail to improve, for GAD, Depression, GERD, and rash.    Bari Learn, FNP   "

## 2024-02-25 NOTE — Patient Instructions (Addendum)
 Contact Dermatitis Dermatitis is redness, soreness, and swelling (inflammation) of the skin. Contact dermatitis is a reaction to certain substances that touch the skin. There are two types of this condition: Irritant contact dermatitis. This is the most common type. It happens when something irritates your skin, such as when your hands get dry from washing them too often with soap. You can get this type of reaction even if you have not been exposed to the irritant before. Allergic contact dermatitis. This type is caused by a substance that you are allergic to, such as poison ivy. It occurs when you have been exposed to the substance (allergen) and form a sensitivity to it. In some cases, the reaction may start soon after your first exposure to the allergen. In other cases, it may not start until you are exposed to the allergen again. It may then occur every time you are exposed to the allergen in the future. What are the causes? Irritant contact dermatitis is often caused by exposure to: Makeup. Soaps, detergents, and bleaches. Acids. Metal salts, such as nickel. Allergic contact dermatitis is often caused by exposure to: Poisonous plants. Chemicals. Jewelry. Latex. Medicines. Preservatives in products, such as clothes. What increases the risk? You are more likely to get this condition if you have: A job that exposes you to irritants or allergens. Certain medical conditions. These include asthma and eczema. What are the signs or symptoms? Symptoms of this condition may occur in any place on your body that has been touched by the irritant. Symptoms include: Dryness, flaking, or cracking. Redness. Itching. Pain or a burning feeling. Blisters. Drainage of small amounts of blood or clear fluid from skin cracks. With allergic contact dermatitis, there may also be swelling in areas such as the eyelids, mouth, or genitals. How is this diagnosed? This condition is diagnosed with a medical  history and physical exam. A patch skin test may be done to help figure out the cause. If the condition is related to your job, you may need to see an expert in health problems in the workplace (occupational medicine specialist). How is this treated? This condition is treated by staying away from the cause of the reaction and protecting your skin from further contact. Treatment may also include: Steroid creams or ointments. Steroid medicines may need be taken by mouth (orally) in more severe cases. Antibiotics or medicines applied to the skin to kill bacteria (antibacterial ointments). These may be needed if a skin infection is present. Antihistamines. These may be taken orally or put on as a lotion to ease itching. A bandage (dressing). Follow these instructions at home: Skin care Moisturize your skin as needed. Put cool, wet cloths (cool compresses) on the affected areas. Try applying baking soda paste to your skin. Stir water into baking soda until it has the consistency of a paste. Do not scratch your skin. Avoid friction to the affected area. Avoid the use of soaps, perfumes, and dyes. Check the affected areas every day for signs of infection. Check for: More redness, swelling, or pain. More fluid or blood. Warmth. Pus or a bad smell. Medicines Take or apply over-the-counter and prescription medicines only as told by your health care provider. If you were prescribed antibiotics, take or apply them as told by your health care provider. Do not stop using the antibiotic even if you start to feel better. Bathing Try taking a bath with: Epsom salts. Follow the instructions on the packaging. You can get these at your local pharmacy  or grocery store. Baking soda. Pour a small amount into the bath as told by your health care provider. Colloidal oatmeal. Follow the instructions on the packaging. You can get this at your local pharmacy or grocery store. Bathe less often. This may mean bathing  every other day. Bathe in lukewarm water. Avoid using hot water. Bandage care If you were given a dressing, change it as told by your health care provider. Wash your hands with soap and water for at least 20 seconds before and after you change your dressing. If soap and water are not available, use hand sanitizer. General instructions Avoid the substance that caused your reaction. If you do not know what caused it, keep a journal to try to track what caused it. Write down: What you eat and drink. What cosmetics you use. What you wear in the affected area. This includes jewelry. Contact a health care provider if: Your condition does not get better with treatment. Your condition gets worse. You have any signs of infection. You have a fever. You have new symptoms. Your bone or joint under the affected area becomes painful after the skin has healed. Get help right away if: You notice red streaks coming from the affected area. The affected area turns darker. You have trouble breathing. This information is not intended to replace advice given to you by your health care provider. Make sure you discuss any questions you have with your health care provider. Document Revised: 08/05/2021 Document Reviewed: 08/05/2021 Elsevier Patient Education  2024 [employer].

## 2024-02-26 ENCOUNTER — Encounter (INDEPENDENT_AMBULATORY_CARE_PROVIDER_SITE_OTHER): Payer: Self-pay | Admitting: *Deleted

## 2024-03-05 ENCOUNTER — Encounter

## 2024-03-05 ENCOUNTER — Telehealth: Payer: Self-pay | Admitting: Internal Medicine

## 2024-03-05 NOTE — Telephone Encounter (Signed)
 Good Morning, I spoke with [name] [name] and she is wanting to switch providers to [place]. She was seeing Dr.Kelly who has retired! Are you okay with patient seeing you in our [place] office?

## 2024-03-05 NOTE — Telephone Encounter (Signed)
 Please disregard pt no longer wants to switch.

## 2024-03-07 ENCOUNTER — Ambulatory Visit (INDEPENDENT_AMBULATORY_CARE_PROVIDER_SITE_OTHER): Admitting: Audiology

## 2024-03-10 ENCOUNTER — Ambulatory Visit: Admitting: Cardiology

## 2024-03-14 ENCOUNTER — Other Ambulatory Visit: Payer: Self-pay | Admitting: General Practice

## 2024-03-14 MED ORDER — SPIRONOLACTONE 25 MG PO TABS: 25.0000 mg | ORAL_TABLET | Freq: Every day | ORAL | 0 refills | Status: AC

## 2024-03-14 NOTE — Telephone Encounter (Signed)
 Valid encounter within last 6 months   Pending appt 05/23/24 w Dr Anner

## 2024-05-23 ENCOUNTER — Ambulatory Visit: Admitting: Cardiology

## 2024-05-27 ENCOUNTER — Ambulatory Visit: Admitting: Family

## 2024-06-02 ENCOUNTER — Ambulatory Visit: Admitting: Internal Medicine
